# Patient Record
Sex: Male | Born: 1960 | Race: White | Hispanic: No | Marital: Married | Smoking: Current every day smoker
Health system: Southern US, Community
[De-identification: ages and names within clinical notes are randomized; demographics above are authoritative.]

---

## 1999-09-07 ENCOUNTER — Emergency Department (HOSPITAL_COMMUNITY): Admission: EM | Admit: 1999-09-07 | Discharge: 1999-09-07 | Payer: Self-pay | Admitting: Emergency Medicine

## 1999-09-07 ENCOUNTER — Encounter: Payer: Self-pay | Admitting: Emergency Medicine

## 2006-12-15 ENCOUNTER — Inpatient Hospital Stay (HOSPITAL_COMMUNITY): Admission: EM | Admit: 2006-12-15 | Discharge: 2006-12-17 | Payer: Self-pay | Admitting: Emergency Medicine

## 2010-06-01 NOTE — Discharge Summary (Signed)
NAME:  JERIEL, VIVANCO NO.:  [id number]   MEDICAL RECORD NO.:  [id number]          PATIENT TYPE:  INP   LOCATION:  1439                         FACILITY:  [facility]   PHYSICIAN:  Gardiner Barefoot, MD    DATE OF BIRTH:  [birth year]   DATE OF ADMISSION:  12/15/2006  DATE OF DISCHARGE:  12/17/2006                               DISCHARGE SUMMARY   ADDENDUM:  In discussion with the patient, his laboratory values have  all normalized except for the bicarb level which remains a little bit  low at 14 which is a decrease from 15 from his last check.  The patient,  however, continues to feel well and is insistent on leaving and does  assure he will follow up with is primary care physician the following  day for a recheck of his blood sugars.  He will continue to check his  blood sugars 3 times a day in the fasting a.m. every day starting today.  He feels comfortable with the plan, and his wife also was comfortable  with the plan of following up with his physician the following day.  For  this reason, I feel it is safe to discharge the patient with close  followup and good p.o. intake with no symptoms of hypoglycemia.      Gardiner Barefoot, MD  Electronically Signed     RWC/MEDQ  D:  12/17/2006  T:  12/17/2006  Job:  [id number]

## 2010-06-01 NOTE — Discharge Summary (Signed)
NAME:  Ronnie Dunn, Ronnie Dunn NO.:  [id number]   MEDICAL RECORD NO.:  [id number]          PATIENT TYPE:  INP   LOCATION:  1439                         FACILITY:  [facility]   PHYSICIAN:  Gardiner Barefoot, MD    DATE OF BIRTH:  11-Feb-1960   DATE OF ADMISSION:  12/15/2006  DATE OF DISCHARGE:  12/17/2006                               DISCHARGE SUMMARY   PRIMARY CARE PHYSICIAN:  Jerene Dilling, M.D.   CHIEF COMPLAINT:  Hypercholesterolemia.   DISCHARGE DIAGNOSES:  1. Diabetic ketoacidosis, resolved.  2. Type 2 diabetes.   DISCHARGE MEDICATIONS:  1. Metformin 1000 mg p.o. q. a.m. and 500 mg p.o. q. p.m.   HISTORY OF PRESENT ILLNESS:  Briefly, this is a 50 year old male who had  a 2 week history of dehydration, polydipsia, polyuria. Two days prior to  admission had presented to his primary care physician and was diagnosed  with diabetes with a blood sugar reported by the patient to be  approximately 1200. The patient was given some insulin in the office and  started on Glucophage 500 mg daily. However, over the 2 days after  starting his medication, he continued to have difficulty with his blood  sugars, dehydration, and nausea and vomiting. The patient then presented  to the emergency room after checking his home blood sugar, which was too  elevated to be read on the machine and was 600 on presentation to the  emergency room.   HOSPITAL COURSE:  The patient was admitted. His initial labs were  concerning with bicarb of 12, glucose of 639, some acute renal failure,  an ABG with pH of 7.22. The patient was started on Glucomander insulin  drip and his blood sugars were followed closely. The patient, overnight  after admission, was transitioned to subcutaneous insulin and his p.o.  Metformin was restarted. The patient did have some diabetes medication.  The patient, at the time of discharge, was able to tolerate p.o., was  ambulatory and eager to go home. The patient does state  that he will  followup with his primary care physician in the next 1 to 2 days and  continue to check his fasting blood sugars daily, as well as prior to  meals and after meals and report to his primary care physician, the  results. He was advised of the new dosage of his Metformin to do 1000 mg  in the morning and 500 in the afternoon, with anticipation of further  increase as an outpatient. At discharge, his creatinine had stabilized  at 1.04 and his sodium was slightly decreased at  133, secondary to increased blood sugar. Of note, his bicarb at  discharge was noted to be 14 and he will need close followup by his  primary care physician.   DISPOSITION:  The patient was stable at discharge and comfortable with  the plan.      Gardiner Barefoot, MD  Electronically Signed     RWC/MEDQ  D:  12/17/2006  T:  12/17/2006  Job:  [id number]   cc:   Windle Guard, M.D.  Fax: [phone]

## 2010-06-01 NOTE — H&P (Signed)
NAME:  Ronnie Dunn, DEC NO.:  [id number]   MEDICAL RECORD NO.:  [id number]          PATIENT TYPE:  EMS   LOCATION:  ED                           FACILITY:  [facility]   PHYSICIAN:  Gardiner Barefoot, MD    DATE OF BIRTH:  [birth year]   DATE OF ADMISSION:  12/15/2006  DATE OF DISCHARGE:                              HISTORY & PHYSICAL   PRIMARY CARE PHYSICIAN:  [name].   CHIEF COMPLAINT:  Hyperglycemia.   HISTORY OF PRESENT ILLNESS:  This is a 50 year old male who had about  approximately 2-week history of dehydration, polyuria, polydipsia, and  had presented to his primary care physician 2 days prior to this  admission with the said complaints and was diagnosed with diabetes with  a blood sugar reported to be 1200.  The patient was subsequently given  insulin in the office and started on Glucophage 500 mg daily.  However,  continued to have difficulty with his sugars, dehydration and/or p.o.  The patient presented to the emergency room after checking his blood  sugar and it was too elevated to be read and was noted to have a blood  sugar in the 600s upon presentation to the emergency room.  The patient  otherwise has had very little p.o. intake over the last several days and  otherwise reports no fever, nausea or vomiting or abdominal pain.   PAST MEDICAL HISTORY:  Diabetes.   MEDICATION:  Glucophage 500 mg daily.   DRUG ALLERGIES:  NO KNOWN DRUG ALLERGIES.   FAMILY HISTORY:  Diabetes in his mother.   SOCIAL HISTORY:  The patient is a smoker.  Denies any alcohol or drugs.   REVIEW OF SYSTEMS:  Negative except as in the history of present  illness.   PHYSICAL EXAMINATION:  Temperature is 97.4, pulse 78, respirations 20,  blood pressure 131/92, and O2 sats 96% on room air.  GENERAL: The patient is awake, alert and oriented x3 and appears in mild  distress.  HEENT:  His mucous membranes are dry.  CARDIOVASCULAR:  Tachycardiac with regular rhythm.  No murmurs,  rubs or  gallops.  LUNGS:  Clear to auscultation bilaterally.  ABDOMEN:  Obese, soft, nontender, nondistended.  Positive bowel sounds  and no hepatosplenomegaly.  EXTREMITIES:  No edema.   LABORATORY DATA:  White count 11, hemoglobin 15, platelets 377.  Sodium  was reported to be 117, however this does not appear in the computer at  this time, potassium 4.5, chloride 81, bicarb is 12, glucose 639, BUN  25, creatinine is 1.97, total bili 3.2, alk phos 86, AST 16, ALT 13,  blood acetone is small, and ABG notable for a pH of 7.22/19/99/7.8.   IMPRESSION:  Diabetic ketoacidosis.  The patient has been started in the  emergency room on the Glucomander and will be continued on the drip  until his blood sugar continues to decrease significantly, and presently  is on normal saline IV fluids, which will be changed to D5 normal saline  when his blood sugars are below 250.  Will transition the patient to  subcutaneous  insulin at the appropriate time when his blood sugars are  below the target range for 4 hours and then restart him on his oral  medications and restart his diet.  Will also have the patient receive  some diabetic education and monitor his potassium closely.      Gardiner Barefoot, MD  Electronically Signed     RWC/MEDQ  D:  12/15/2006  T:  12/15/2006  Job:  [id number]

## 2010-10-26 LAB — BLOOD GAS, ARTERIAL
Acid-base deficit: 18.4 — ABNORMAL HIGH
TCO2: 7.1
pCO2 arterial: 19.5 — CL
pO2, Arterial: 99.1

## 2010-10-26 LAB — COMPREHENSIVE METABOLIC PANEL
ALT: 13
AST: 16
Albumin: 3.6
Alkaline Phosphatase: 86
BUN: 25 — ABNORMAL HIGH
CO2: 12 — ABNORMAL LOW
Calcium: 8.8
Chloride: 81 — ABNORMAL LOW
Creatinine, Ser: 1.97 — ABNORMAL HIGH
GFR calc Af Amer: 44 — ABNORMAL LOW
GFR calc non Af Amer: 37 — ABNORMAL LOW
Glucose, Bld: 639
Potassium: 4.5
Total Bilirubin: 3.2 — ABNORMAL HIGH
Total Protein: 7.8

## 2010-10-26 LAB — URINALYSIS, ROUTINE W REFLEX MICROSCOPIC
Glucose, UA: >1000 — AB
Leukocytes, UA: NEGATIVE
Nitrite: NEGATIVE
Protein, ur: NEGATIVE
pH: 5

## 2010-10-26 LAB — DIFFERENTIAL
Basophils Absolute: 0.1
Basophils Relative: 1
Eosinophils Absolute: 0.1 — ABNORMAL LOW
Eosinophils Relative: 1
Lymphocytes Relative: 17
Lymphs Abs: 1.8
Monocytes Absolute: 0.6
Monocytes Relative: 5
Neutro Abs: 8.3 — ABNORMAL HIGH
Neutrophils Relative %: 76

## 2010-10-26 LAB — BASIC METABOLIC PANEL
BUN: 13
BUN: 18
Calcium: 7.6 — ABNORMAL LOW
Calcium: 8 — ABNORMAL LOW
Calcium: 8.1 — ABNORMAL LOW
Calcium: 8.2 — ABNORMAL LOW
Chloride: 94 — ABNORMAL LOW
Creatinine, Ser: 1.18
Creatinine, Ser: 1.21
Creatinine, Ser: 1.38
GFR calc Af Amer: >60
GFR calc Af Amer: >60
GFR calc Af Amer: >60
GFR calc non Af Amer: 55 — ABNORMAL LOW
GFR calc non Af Amer: >60
GFR calc non Af Amer: >60
GFR calc non Af Amer: >60
Potassium: 3.3 — ABNORMAL LOW
Potassium: 3.9
Sodium: 129 — ABNORMAL LOW
Sodium: 133 — ABNORMAL LOW

## 2010-10-26 LAB — CBC
HCT: 46.4
Hemoglobin: 16.6
MCHC: 35.6
MCV: 88.8
Platelets: 377
RBC: 5.23
RDW: 12.2
WBC: 11 — ABNORMAL HIGH

## 2010-10-26 LAB — KETONES, QUALITATIVE

## 2010-10-26 LAB — URINE MICROSCOPIC-ADD ON

## 2019-11-07 ENCOUNTER — Other Ambulatory Visit: Payer: Self-pay

## 2019-11-07 ENCOUNTER — Ambulatory Visit
Admission: RE | Admit: 2019-11-07 | Discharge: 2019-11-07 | Disposition: A | Discharge status: Home / Self Care | Payer: BC Managed Care – PPO | Source: Ambulatory Visit | Attending: Nurse Practitioner | Admitting: Nurse Practitioner

## 2019-11-07 ENCOUNTER — Other Ambulatory Visit: Payer: Self-pay | Admitting: Nurse Practitioner

## 2019-11-07 DIAGNOSIS — M25571 Pain in right ankle and joints of right foot: Secondary | ICD-10-CM

## 2019-11-29 ENCOUNTER — Other Ambulatory Visit: Payer: Self-pay | Admitting: Nurse Practitioner

## 2019-11-29 ENCOUNTER — Ambulatory Visit
Admission: RE | Admit: 2019-11-29 | Discharge: 2019-11-29 | Disposition: A | Discharge status: Home / Self Care | Payer: BC Managed Care – PPO | Source: Ambulatory Visit | Attending: Nurse Practitioner | Admitting: Nurse Practitioner

## 2019-11-29 ENCOUNTER — Other Ambulatory Visit: Payer: Self-pay

## 2019-11-29 DIAGNOSIS — M79671 Pain in right foot: Secondary | ICD-10-CM

## 2020-03-28 ENCOUNTER — Inpatient Hospital Stay (HOSPITAL_COMMUNITY)
Admission: EM | Admit: 2020-03-28 | Discharge: 2020-03-29 | DRG: 101 | Disposition: A | Discharge status: Home / Self Care | Payer: 59 | Attending: Student in an Organized Health Care Education/Training Program | Admitting: Student in an Organized Health Care Education/Training Program

## 2020-03-28 ENCOUNTER — Other Ambulatory Visit: Payer: Self-pay

## 2020-03-28 ENCOUNTER — Emergency Department (HOSPITAL_COMMUNITY): Payer: 59

## 2020-03-28 DIAGNOSIS — E669 Obesity, unspecified: Secondary | ICD-10-CM | POA: Diagnosis present

## 2020-03-28 DIAGNOSIS — I959 Hypotension, unspecified: Secondary | ICD-10-CM | POA: Diagnosis present

## 2020-03-28 DIAGNOSIS — F10239 Alcohol dependence with withdrawal, unspecified: Secondary | ICD-10-CM | POA: Diagnosis present

## 2020-03-28 DIAGNOSIS — N62 Hypertrophy of breast: Secondary | ICD-10-CM | POA: Diagnosis present

## 2020-03-28 DIAGNOSIS — I781 Nevus, non-neoplastic: Secondary | ICD-10-CM | POA: Diagnosis present

## 2020-03-28 DIAGNOSIS — I10 Essential (primary) hypertension: Secondary | ICD-10-CM | POA: Diagnosis present

## 2020-03-28 DIAGNOSIS — E119 Type 2 diabetes mellitus without complications: Secondary | ICD-10-CM | POA: Diagnosis present

## 2020-03-28 DIAGNOSIS — E860 Dehydration: Secondary | ICD-10-CM | POA: Diagnosis present

## 2020-03-28 DIAGNOSIS — R21 Rash and other nonspecific skin eruption: Secondary | ICD-10-CM | POA: Diagnosis present

## 2020-03-28 DIAGNOSIS — Z6833 Body mass index (BMI) 33.0-33.9, adult: Secondary | ICD-10-CM

## 2020-03-28 DIAGNOSIS — F17210 Nicotine dependence, cigarettes, uncomplicated: Secondary | ICD-10-CM | POA: Diagnosis present

## 2020-03-28 DIAGNOSIS — R062 Wheezing: Secondary | ICD-10-CM | POA: Diagnosis not present

## 2020-03-28 DIAGNOSIS — F1010 Alcohol abuse, uncomplicated: Secondary | ICD-10-CM

## 2020-03-28 DIAGNOSIS — R55 Syncope and collapse: Secondary | ICD-10-CM | POA: Diagnosis not present

## 2020-03-28 DIAGNOSIS — E785 Hyperlipidemia, unspecified: Secondary | ICD-10-CM | POA: Diagnosis present

## 2020-03-28 DIAGNOSIS — K769 Liver disease, unspecified: Secondary | ICD-10-CM | POA: Diagnosis present

## 2020-03-28 DIAGNOSIS — R569 Unspecified convulsions: Secondary | ICD-10-CM | POA: Diagnosis present

## 2020-03-28 DIAGNOSIS — E871 Hypo-osmolality and hyponatremia: Secondary | ICD-10-CM | POA: Diagnosis present

## 2020-03-28 DIAGNOSIS — F1020 Alcohol dependence, uncomplicated: Secondary | ICD-10-CM | POA: Diagnosis present

## 2020-03-28 DIAGNOSIS — Z20822 Contact with and (suspected) exposure to covid-19: Secondary | ICD-10-CM | POA: Diagnosis present

## 2020-03-28 DIAGNOSIS — J441 Chronic obstructive pulmonary disease with (acute) exacerbation: Secondary | ICD-10-CM | POA: Diagnosis present

## 2020-03-28 DIAGNOSIS — E861 Hypovolemia: Secondary | ICD-10-CM | POA: Diagnosis present

## 2020-03-28 DIAGNOSIS — E872 Acidosis: Secondary | ICD-10-CM | POA: Diagnosis present

## 2020-03-28 DIAGNOSIS — Z801 Family history of malignant neoplasm of trachea, bronchus and lung: Secondary | ICD-10-CM

## 2020-03-28 DIAGNOSIS — Z79899 Other long term (current) drug therapy: Secondary | ICD-10-CM

## 2020-03-28 LAB — OSMOLALITY: Osmolality: 257 mOsm/kg — ABNORMAL LOW (ref 275–295)

## 2020-03-28 LAB — BASIC METABOLIC PANEL
Anion gap: 11 (ref 5–15)
Anion gap: 8 (ref 5–15)
BUN: 11 mg/dL (ref 6–20)
BUN: 9 mg/dL (ref 6–20)
CO2: 21 mmol/L — ABNORMAL LOW (ref 22–32)
CO2: 23 mmol/L (ref 22–32)
Calcium: 8.3 mg/dL — ABNORMAL LOW (ref 8.9–10.3)
Calcium: 8.1 mg/dL — ABNORMAL LOW (ref 8.9–10.3)
Chloride: 88 mmol/L — ABNORMAL LOW (ref 98–111)
Chloride: 91 mmol/L — ABNORMAL LOW (ref 98–111)
Creatinine, Ser: 0.78 mg/dL (ref 0.61–1.24)
Creatinine, Ser: 0.74 mg/dL (ref 0.61–1.24)
GFR, Estimated: >60 mL/min (ref >60)
GFR, Estimated: >60 mL/min (ref >60)
Glucose, Bld: 105 mg/dL — ABNORMAL HIGH (ref 70–99)
Glucose, Bld: 96 mg/dL (ref 70–99)
Potassium: 4.4 mmol/L (ref 3.5–5.1)
Potassium: 4.2 mmol/L (ref 3.5–5.1)
Sodium: 120 mmol/L — ABNORMAL LOW (ref 135–145)
Sodium: 122 mmol/L — ABNORMAL LOW (ref 135–145)

## 2020-03-28 LAB — RAPID URINE DRUG SCREEN, HOSP PERFORMED
Amphetamines: NOT DETECTED
Barbiturates: NOT DETECTED
Benzodiazepines: NOT DETECTED
Cocaine: NOT DETECTED
Opiates: NOT DETECTED
Tetrahydrocannabinol: NOT DETECTED

## 2020-03-28 LAB — URINALYSIS, ROUTINE W REFLEX MICROSCOPIC
Bacteria, UA: NONE SEEN
Bilirubin Urine: NEGATIVE
Glucose, UA: NEGATIVE mg/dL
Hgb urine dipstick: NEGATIVE
Ketones, ur: NEGATIVE mg/dL
Nitrite: NEGATIVE
Protein, ur: 100 mg/dL — AB
Specific Gravity, Urine: 1.01 (ref 1.005–1.030)
pH: 6 (ref 5.0–8.0)

## 2020-03-28 LAB — COMPREHENSIVE METABOLIC PANEL
ALT: 17 U/L (ref 0–44)
AST: 35 U/L (ref 15–41)
Albumin: 3.2 g/dL — ABNORMAL LOW (ref 3.5–5.0)
Alkaline Phosphatase: 93 U/L (ref 38–126)
Anion gap: 11 (ref 5–15)
BUN: 11 mg/dL (ref 6–20)
CO2: 22 mmol/L (ref 22–32)
Calcium: 8.3 mg/dL — ABNORMAL LOW (ref 8.9–10.3)
Chloride: 86 mmol/L — ABNORMAL LOW (ref 98–111)
Creatinine, Ser: 0.98 mg/dL (ref 0.61–1.24)
GFR, Estimated: >60 mL/min (ref >60)
Glucose, Bld: 123 mg/dL — ABNORMAL HIGH (ref 70–99)
Potassium: 4.7 mmol/L (ref 3.5–5.1)
Sodium: 119 mmol/L — CL (ref 135–145)
Total Bilirubin: 0.9 mg/dL (ref 0.3–1.2)
Total Protein: 6.9 g/dL (ref 6.5–8.1)

## 2020-03-28 LAB — LIPASE, BLOOD: Lipase: 51 U/L (ref 11–51)

## 2020-03-28 LAB — CBC WITH DIFFERENTIAL/PLATELET
Abs Immature Granulocytes: 0.03 10*3/uL (ref 0.00–0.07)
Basophils Absolute: 0.1 10*3/uL (ref 0.0–0.1)
Basophils Relative: 1 %
Eosinophils Absolute: 0.1 10*3/uL (ref 0.0–0.5)
Eosinophils Relative: 1 %
HCT: 33 % — ABNORMAL LOW (ref 39.0–52.0)
Hemoglobin: 12.1 g/dL — ABNORMAL LOW (ref 13.0–17.0)
Immature Granulocytes: 0 %
Lymphocytes Relative: 10 %
Lymphs Abs: 0.7 10*3/uL (ref 0.7–4.0)
MCH: 34.5 pg — ABNORMAL HIGH (ref 26.0–34.0)
MCHC: 36.7 g/dL — ABNORMAL HIGH (ref 30.0–36.0)
MCV: 94 fL (ref 80.0–100.0)
Monocytes Absolute: 0.6 10*3/uL (ref 0.1–1.0)
Monocytes Relative: 8 %
Neutro Abs: 5.7 10*3/uL (ref 1.7–7.7)
Neutrophils Relative %: 80 %
Platelets: 412 10*3/uL — ABNORMAL HIGH (ref 150–400)
RBC: 3.51 MIL/uL — ABNORMAL LOW (ref 4.22–5.81)
RDW: 11.6 % (ref 11.5–15.5)
WBC: 7.1 10*3/uL (ref 4.0–10.5)
nRBC: 0 % (ref 0.0–0.2)

## 2020-03-28 LAB — LACTIC ACID, PLASMA
Lactic Acid, Venous: 2.3 mmol/L (ref 0.5–1.9)
Lactic Acid, Venous: 1.8 mmol/L (ref 0.5–1.9)

## 2020-03-28 LAB — ETHANOL: Alcohol, Ethyl (B): <10 mg/dL (ref <10)

## 2020-03-28 LAB — RESP PANEL BY RT-PCR (FLU A&B, COVID) ARPGX2
Influenza A by PCR: NEGATIVE
Influenza B by PCR: NEGATIVE
SARS Coronavirus 2 by RT PCR: NEGATIVE

## 2020-03-28 LAB — MAGNESIUM
Magnesium: 1.3 mg/dL — ABNORMAL LOW (ref 1.7–2.4)
Magnesium: 2 mg/dL (ref 1.7–2.4)

## 2020-03-28 LAB — BRAIN NATRIURETIC PEPTIDE: B Natriuretic Peptide: 52.2 pg/mL (ref 0.0–100.0)

## 2020-03-28 LAB — PROTIME-INR
INR: 1 (ref 0.8–1.2)
Prothrombin Time: 12.6 seconds (ref 11.4–15.2)

## 2020-03-28 LAB — I-STAT VENOUS BLOOD GAS, ED
Acid-base deficit: 2 mmol/L (ref 0.0–2.0)
Bicarbonate: 23.5 mmol/L (ref 20.0–28.0)
Calcium, Ion: 1.01 mmol/L — ABNORMAL LOW (ref 1.15–1.40)
HCT: 36 % — ABNORMAL LOW (ref 39.0–52.0)
Hemoglobin: 12.2 g/dL — ABNORMAL LOW (ref 13.0–17.0)
O2 Saturation: 85 %
Potassium: 4.7 mmol/L (ref 3.5–5.1)
Sodium: 120 mmol/L — ABNORMAL LOW (ref 135–145)
TCO2: 25 mmol/L (ref 22–32)
pCO2, Ven: 41.2 mmHg — ABNORMAL LOW (ref 44.0–60.0)
pH, Ven: 7.364 (ref 7.250–7.430)
pO2, Ven: 52 mmHg — ABNORMAL HIGH (ref 32.0–45.0)

## 2020-03-28 LAB — HIV ANTIBODY (ROUTINE TESTING W REFLEX): HIV Screen 4th Generation wRfx: NONREACTIVE

## 2020-03-28 LAB — CBG MONITORING, ED: Glucose-Capillary: 130 mg/dL — ABNORMAL HIGH (ref 70–99)

## 2020-03-28 LAB — TROPONIN I (HIGH SENSITIVITY)
Troponin I (High Sensitivity): 8 ng/L (ref <18)
Troponin I (High Sensitivity): 7 ng/L (ref <18)

## 2020-03-28 LAB — OSMOLALITY, URINE: Osmolality, Ur: 285 mOsm/kg — ABNORMAL LOW (ref 300–900)

## 2020-03-28 LAB — PHOSPHORUS
Phosphorus: 4 mg/dL (ref 2.5–4.6)
Phosphorus: 3.5 mg/dL (ref 2.5–4.6)

## 2020-03-28 LAB — TSH: TSH: 1.567 u[IU]/mL (ref 0.350–4.500)

## 2020-03-28 LAB — SODIUM, URINE, RANDOM: Sodium, Ur: 13 mmol/L

## 2020-03-28 MED ORDER — PREDNISONE 20 MG PO TABS
40.0000 mg | ORAL_TABLET | Freq: Every day | ORAL | Status: DC
  Administered 2020-03-29: 40 mg via ORAL
  Filled 2020-03-28: qty 2

## 2020-03-28 MED ORDER — ATORVASTATIN CALCIUM 10 MG PO TABS
10.0000 mg | ORAL_TABLET | Freq: Every day | ORAL | Status: DC
  Administered 2020-03-29: 10 mg via ORAL
  Filled 2020-03-28: qty 1

## 2020-03-28 MED ORDER — MOMETASONE FURO-FORMOTEROL FUM 100-5 MCG/ACT IN AERO: 2.0000 | INHALATION_SPRAY | Freq: Two times a day (BID) | RESPIRATORY_TRACT | Status: DC

## 2020-03-28 MED ORDER — ACETAMINOPHEN 325 MG PO TABS: 650.0000 mg | ORAL_TABLET | Freq: Four times a day (QID) | ORAL | Status: DC | PRN

## 2020-03-28 MED ORDER — UMECLIDINIUM BROMIDE 62.5 MCG/INH IN AEPB
1.0000 | INHALATION_SPRAY | Freq: Every day | RESPIRATORY_TRACT | Status: DC
  Administered 2020-03-29: 1 via RESPIRATORY_TRACT
  Filled 2020-03-28: qty 7

## 2020-03-28 MED ORDER — LORAZEPAM 2 MG/ML IJ SOLN
1.0000 mg | Freq: Once | INTRAMUSCULAR | Status: AC
  Administered 2020-03-28: 1 mg via INTRAVENOUS

## 2020-03-28 MED ORDER — ADULT MULTIVITAMIN W/MINERALS CH
1.0000 | ORAL_TABLET | Freq: Every day | ORAL | Status: DC
  Administered 2020-03-29: 1 via ORAL
  Filled 2020-03-28: qty 1

## 2020-03-28 MED ORDER — SODIUM CHLORIDE 0.9 % IV SOLN: Freq: Once | INTRAVENOUS | Status: AC

## 2020-03-28 MED ORDER — FOLIC ACID 1 MG PO TABS
1.0000 mg | ORAL_TABLET | Freq: Every day | ORAL | Status: DC
  Administered 2020-03-29: 1 mg via ORAL
  Filled 2020-03-28: qty 1

## 2020-03-28 MED ORDER — LORAZEPAM 1 MG PO TABS: 0.0000 mg | ORAL_TABLET | Freq: Two times a day (BID) | ORAL | Status: DC

## 2020-03-28 MED ORDER — ACETAMINOPHEN 650 MG RE SUPP: 650.0000 mg | Freq: Four times a day (QID) | RECTAL | Status: DC | PRN

## 2020-03-28 MED ORDER — LORAZEPAM 1 MG PO TABS: 0.0000 mg | ORAL_TABLET | Freq: Four times a day (QID) | ORAL | Status: DC

## 2020-03-28 MED ORDER — CALAMINE EX LOTN
TOPICAL_LOTION | Freq: Two times a day (BID) | CUTANEOUS | Status: DC
  Filled 2020-03-28: qty 177

## 2020-03-28 MED ORDER — THIAMINE HCL 100 MG PO TABS
100.0000 mg | ORAL_TABLET | Freq: Every day | ORAL | Status: DC
  Administered 2020-03-29: 100 mg via ORAL
  Filled 2020-03-28 (×2): qty 1

## 2020-03-28 MED ORDER — THIAMINE HCL 100 MG/ML IJ SOLN
100.0000 mg | Freq: Every day | INTRAMUSCULAR | Status: DC
  Administered 2020-03-28: 100 mg via INTRAVENOUS
  Filled 2020-03-28: qty 2

## 2020-03-28 MED ORDER — SODIUM CHLORIDE 0.9 % IV BOLUS
1000.0000 mL | Freq: Once | INTRAVENOUS | Status: AC
  Administered 2020-03-28: 1000 mL via INTRAVENOUS

## 2020-03-28 MED ORDER — LORAZEPAM 2 MG/ML IJ SOLN
0.0000 mg | Freq: Four times a day (QID) | INTRAMUSCULAR | Status: DC
  Administered 2020-03-29: 2 mg via INTRAVENOUS
  Filled 2020-03-28 (×2): qty 1

## 2020-03-28 MED ORDER — LORAZEPAM 2 MG/ML IJ SOLN: 0.0000 mg | Freq: Two times a day (BID) | INTRAMUSCULAR | Status: DC

## 2020-03-28 MED ORDER — SODIUM CHLORIDE 0.9% FLUSH
3.0000 mL | Freq: Two times a day (BID) | INTRAVENOUS | Status: DC
  Administered 2020-03-28 – 2020-03-29 (×2): 3 mL via INTRAVENOUS

## 2020-03-28 MED ORDER — MOMETASONE FURO-FORMOTEROL FUM 200-5 MCG/ACT IN AERO
2.0000 | INHALATION_SPRAY | Freq: Two times a day (BID) | RESPIRATORY_TRACT | Status: DC
  Administered 2020-03-28 – 2020-03-29 (×2): 2 via RESPIRATORY_TRACT
  Filled 2020-03-28 (×2): qty 8.8

## 2020-03-28 MED ORDER — POLYETHYLENE GLYCOL 3350 17 G PO PACK: 17.0000 g | PACK | Freq: Every day | ORAL | Status: DC | PRN

## 2020-03-28 MED ORDER — SODIUM CHLORIDE 0.9 % IV SOLN: INTRAVENOUS | Status: AC

## 2020-03-28 MED ORDER — THIAMINE HCL 100 MG PO TABS: 100.0000 mg | ORAL_TABLET | Freq: Every day | ORAL | Status: DC

## 2020-03-28 MED ORDER — MAGNESIUM SULFATE 2 GM/50ML IV SOLN
2.0000 g | Freq: Once | INTRAVENOUS | Status: AC
  Administered 2020-03-28: 2 g via INTRAVENOUS
  Filled 2020-03-28: qty 50

## 2020-03-28 MED ORDER — ENOXAPARIN SODIUM 60 MG/0.6ML ~~LOC~~ SOLN
60.0000 mg | SUBCUTANEOUS | Status: DC
  Filled 2020-03-28: qty 0.6

## 2020-03-28 NOTE — H&P (Addendum)
Date: 03/28/2020               Ronnie Dunn Name:  Ronnie Dunn MRN: [id number]  DOB: 19-Feb-1960 Age / Sex: 60 y.o., male   PCP: Ronnie Dunn, No Pcp Per         Medical Service: Internal Medicine Teaching Service         Attending Physician: Dr. Charlesetta Shanks, MD    First Contact: Sanjuana Letters, DO Pager: Maryjean Morn [phone]  Second Contact: Marianna Payment, DO Pager: [facility] [phone]       After Hours (After 5p/  First Contact Pager: [phone]  weekends / holidays): Second Contact Pager: [phone]   SUBJECTIVE   Chief Complaint: Seizure-like activity  History of Present Illness: Ronnie Dunn is a 60 y.o. male with a pertinent PMH of hypertension, HLD, type II DM, alcohol use, syncopal episodes of unknown etiology who presents to [facility] with witnessed syncopal episode this morning.  Ronnie Dunn woke up this morning stating he did not feel well.  He sat up on the edge of the bed and his wife states he fell back on the bed and began shaking.  During this episode he lost control of his bowel and bladder.  He was also unresponsive during this time, he did not hit his head.  She is unsure of how long this episode lasted, but states that he was confused after the event for 30 minutes to an hour.  EMS was then called to the scene.  Ronnie Dunn's wife states that once EMS arrived, his pressures were low and EMS passed out.   The Ronnie Dunn's wife endorses a similar episode that occurred 2 weeks ago.  The Ronnie Dunn sat up in bed fell backwards his left arm contracted while his right arm flexed fully and flaccid.  She notes he shake vigorously for less than 30 seconds and then came to.  She states he was only confused for a little bit of time after this, and the Ronnie Dunn did not want to come to the hospital to be evaluated.  He also lost bowel or bladder function during this.  During both episodes Ronnie Dunn was cold wet and clammy.  Prior to both seizure episodes, the Ronnie Dunn notes that he feels lightheaded and can feel the episode  starting.  Ronnie Dunn has history of syncopal episodes of the past few years, that he states were due to heat and low blood pressure.  He has never been evaluated for this.  Over the past few weeks the Ronnie Dunn states he has not felt well, feeling unusually tired.  He has had a decrease in appetite for the past few days.  He has vomited once or twice a day for the week.  The vomit is liquidy and white in nature.  He also endorses diarrhea for the past 3 days, he has 2 episodes per day.  There is no blood in the diarrhea.  Denies any changes in his diet.  Ronnie Dunn endorses drinking 8-10 beers per day since he was 60 years old.  He had 3 months of sobriety few years ago, that he stopped "cold [place]."  He has never had to be hospitalized for alcohol withdrawals or due to his alcohol use.  Last week he stopped drinking for 3 days, denies any symptoms of withdrawal.  His last drink prior to arrival was 8 PM yesterday evening.  He denies any fevers or chills.  He denies any chest pain or palpitations.  Denies any shortness of breath.  Denies  any nausea.  Ronnie Dunn also endorses a rash on his left lower extremity as well as left forearm, that is itchy.  He notes that it occurred after being outside setting up his deer stand.  Medications:  Current Meds  Medication Sig   amLODipine (NORVASC) 5 MG tablet Take 5 mg by mouth daily.   atorvastatin (LIPITOR) 10 MG tablet Take 10 mg by mouth daily.   benzonatate (TESSALON) 200 MG capsule Take 200 mg by mouth 3 (three) times daily as needed for cough.   cholecalciferol (VITAMIN D3) 25 MCG (1000 UNIT) tablet Take 1,000 Units by mouth daily.   hydrochlorothiazide (HYDRODIURIL) 25 MG tablet Take 50 mg by mouth daily.   lisinopril (ZESTRIL) 40 MG tablet Take 40 mg by mouth daily.   naproxen (NAPROSYN) 500 MG tablet Take 500 mg by mouth daily as needed for pain.   traZODone (DESYREL) 50 MG tablet Take 50 mg by mouth daily as needed for sleep (anxiety).   vitamin C (ASCORBIC  ACID) 500 MG tablet Take 500 mg by mouth daily.   Zinc 25 MG TABS Take 25 mg by mouth daily.    Social:  Lives -wife Occupation - pool maintenance Support -family Level of function -able to perform all ADLs without difficulty PCP -none Substance use -alcohol, beer 8-10 a day.  Heavy tobacco use, 2 packs/day for the past 35 years.  Denies illicit substance use or taking any medications that were not prescribed to him.  Family History: Siblings -atrial fibrillation in his brother, sister with lung cancer status post lobectomy  Allergies: Allergies as of 03/28/2020   (No Known Allergies)   No past medical history on file.  Review of Systems: A complete ROS was negative except as per HPI.   OBJECTIVE:   Physical Exam: Blood pressure (!) 146/91, pulse 89, temperature 98 F (36.7 C), temperature source Oral, resp. rate 20, height 6' 3"  (1.905 m), weight 121.1 kg, SpO2 97 %.  Constitutional: Resting in bed comfortably, no acute distress HENT: normocephalic atraumatic, dry mucous membranes.  Facial erythema Eyes: conjunctiva non-erythematous, EOMI, PERRL Neck: supple Cardiovascular: regular rate and rhythm, no m/r/g Pulmonary/Chest: Diffuse wheezing.  No respiratory distress. Abdominal: Soft, obese, nontender. Normal bowel sounds. MSK: normal bulk and tone Neurological: alert & oriented x 3, 5/5 strength in bilateral upper and lower extremities, Ronnie Dunn without facial asymmetry, Ronnie Dunn without uvular deviation, normal finger-to-nose, normal heel-to-shin, no pronator drift appreciated.  Normal sensation throughout. skin: warm and dry.  Spider telangiectasias on chest.  Left lower extremity rash with excoriations and small ulcerative lesions Psych: Normal mood  Labs: CBC    Component Value Date/Time   WBC 7.1 03/28/2020 1136   RBC 3.51 (L) 03/28/2020 1136   HGB 12.2 (L) 03/28/2020 1222   HCT 36.0 (L) 03/28/2020 1222   PLT 412 (H) 03/28/2020 1136   MCV 94.0 03/28/2020 1136    MCH 34.5 (H) 03/28/2020 1136   MCHC 36.7 (H) 03/28/2020 1136   RDW 11.6 03/28/2020 1136   LYMPHSABS 0.7 03/28/2020 1136   MONOABS 0.6 03/28/2020 1136   EOSABS 0.1 03/28/2020 1136   BASOSABS 0.1 03/28/2020 1136     CMP     Component Value Date/Time   NA 120 (L) 03/28/2020 1222   K 4.7 03/28/2020 1222   CL 86 (L) 03/28/2020 1136   CO2 22 03/28/2020 1136   GLUCOSE 123 (H) 03/28/2020 1136   BUN 11 03/28/2020 1136   CREATININE 0.98 03/28/2020 1136   CALCIUM 8.3 (L) 03/28/2020  1136   PROT 6.9 03/28/2020 1136   ALBUMIN 3.2 (L) 03/28/2020 1136   AST 35 03/28/2020 1136   ALT 17 03/28/2020 1136   ALKPHOS 93 03/28/2020 1136   BILITOT 0.9 03/28/2020 1136   GFRNONAA >60 03/28/2020 1136   GFRAA  12/17/2006 0500    >60        The eGFR has been calculated using the MDRD equation. This calculation has not been validated in all clinical    Imaging: DG Chest 2 View  Result Date: 03/28/2020 CLINICAL DATA:  Syncope EXAM: CHEST - 2 VIEW COMPARISON:  None. FINDINGS: Cardiomegaly. Both lungs are clear. Disc degenerative disease of the thoracic spine. IMPRESSION: Cardiomegaly without acute abnormality of the lungs. Electronically Signed   By: Eddie Candle M.D.   On: 03/28/2020 13:04   CT Head Wo Contrast  Result Date: 03/28/2020 CLINICAL DATA:  Seizure. EXAM: CT HEAD WITHOUT CONTRAST TECHNIQUE: Contiguous axial images were obtained from the base of the skull through the vertex without intravenous contrast. COMPARISON:  None. FINDINGS: Brain: No subdural, epidural, or subarachnoid hemorrhage. Cerebellum, brainstem, and basal cisterns are normal. Ventricles and sulci are unremarkable. No acute cortical ischemia or infarct. No mass, mass effect, or midline shift. Vascular: Calcified atherosclerosis in the intracranial carotids. Skull: Normal. Negative for fracture or focal lesion. Sinuses/Orbits: No acute finding. Other: None. IMPRESSION: No acute abnormalities.  No cause for seizure identified.  Electronically Signed   By: Dorise Bullion III M.D   On: 03/28/2020 12:09    EKG: personally reviewed my interpretation is normal sinus rhythm at a rate of 75 bpm.  Prolonged PR interval.  Normal axis.  Normal QRS interval.  Broad T waves.  No acute ST wave changes.  No prior for comparison.  ASSESSMENT & PLAN:   Principal Problem:   Seizure ([place]) Active Problems:   Hyponatremia   Alcohol use disorder, moderate, dependence ([place])   Obesity   Essential hypertension  Ronnie Dunn is a 60 y.o. with pertinent PMH of PMH of hypertension, HLD, type II DM, alcohol use, syncopal episodes of unknown etiology who presented syncopal episode with seizure-like activity and admit for hyponatremia, seizure work-up, alcohol use disorder on hospital day 0  Hx of Syncopal Episodes Suspected Seizure Ronnie Dunn with history of syncopal episodes, Ronnie Dunn's wife states that the etiology of these were due to "heat" and low blood pressure.  Ronnie Dunn's wife's history of 2 episodes of shaking spells consistent with seizures: Loss of consciousness, loss of bowel and bladder function, diffuse shaking.  Ronnie Dunn with multiple possible etiologies of seizures and syncope: Alcohol use, hyponatremia or other electrolyte abnormalities, hypovolemia.  Suspect lactic acidosis secondary to seizure.  Due to Ronnie Dunn's lack of medical care, will need extensive work-up.  Will monitor for aspiration in setting of seizure.  In the setting of his acute severe hyponatremia and neurological symptoms, will consult PCCM. -PCCM consult pending -Head CT, no acute processes. -Consider EEG if additional seizures during admission -Trend lactate, receiving IV fluids for hyponatremia as per below -Echo pending -Correct hyponatremia as per below -CIWA protocol  Hyponatremia  Ronnie Dunn worsened initial sodium of 119 mmol/L.  Will correct slowly with normal saline, first 24 hours correction of 8 mmol/L. After that goal of 6 to 8 mmol/L correction  every 4 hours with normal saline.  Needs further testing below to determine underlying etiology.  Suspected etiologies of poor p.o. intake, diuretic, alcohol use. -BMPs every 4 hours, goal sodium of 125 over next 12 hours -Normal  saline 125 cc/h continuous -Pending serum osmolality, urine sodium, urine osmolality  Alcohol Use Hypomagnesemia Ronnie Dunn drinks 8-10 beers a day since he was young.  He has been able to stop in the past without having withdrawal-like symptoms.  The longest abstinence he had was 3 months.  Ronnie Dunn without signs/symptoms of cirrhosis, without elevated liver enzymes.  Platelets within normal limits.  No obvious signs of ascites to indicate any signs of hepatic congestion.  Do not suspect right upper quadrant ultrasound is warranted at this time.  Alcohol level of under 10. -CIWA protocol with Ativan -Replete mag with 2 g mag sulfate IV -We will provide resources for outpatient substance use programs -We will give thiamine, multivitamin, and folate.  Wheezing Ronnie Dunn with no formal diagnosis of COPD but in setting of wheezing and extensive tobacco use history, would expect some component of obstructive pulmonary disease. -Ronnie Dunn will need pulmonary outpatient follow-up for PFTs  Left lower extremity rash Ronnie Dunn notes itchy left lower extremity rash with small ulcerations and excoriations.  He states these onset after he was out walking in the woods.  Consistent with allergic etiology. -Calamine lotion twice daily  Hypertension History of hypertension, Ronnie Dunn takes amlodipine 5 mg daily, HCTZ 25 mg daily, lisinopril 40 mg daily. -Can restart home medications if need be  Diet: Normal VTE: Enoxaparin IVF: NS,125cc/hr Code: Full  Prior to Admission Living Arrangement: Home, living With wife Anticipated Discharge Location: Home Barriers to Discharge: Further evaluation of seizures  Dispo: Admit Ronnie Dunn to Inpatient with expected length of stay greater than 2  midnights.  Signed: Sanjuana Letters DO  Internal Medicine Resident PGY-1 [place]  Pager: [phone]    Please contact the on call pager after 5 pm and on weekends at [phone].

## 2020-03-28 NOTE — ED Provider Notes (Signed)
[facility] EMERGENCY DEPARTMENT Provider Note   CSN: [id number] Arrival date & time: 03/28/20  1048     History Chief Complaint  Patient presents with  . Seizures    Ronnie Dunn is a 60 y.o. male.  HPI Patient reports that he went to bed feeling a little unwell last night.  He cannot qualify in what respect.  He awakened this morning and set up at the edge of the bed.  He reports he immediately felt lightheaded and next thing he knew he was waking up on the floor with his wife calling EMS.  He denies he experienced any preceding headache or chest pain.  He denies he awakened with chest pain or headache.  Patient's wife is not here yet.  The report is there was 30 seconds of seizure activity.  Patient reports he thinks he passed out but doubted that he had a seizure.  He reports the same thing happened about 2 weeks ago.  He reports he awakened feeling fine and then sat up at edge of the bed and immediately lost consciousness for a brief period of time.  He did not seek any medical evaluation at that time he reports this morning his blood pressure was low when checked by EMS.  EMS report as it patient systolic blood pressure went from 110 sitting to 70s standing.  Patient was given a liter of fluids on route.  He reports he takes his blood pressure medications at night and always has without difficulty.  He took them last night as usual.  He reports he is on 3 medications.  He reports he does drink beer nightly.  He estimates 7-8 beers every evening.  He reports he smokes a pack of cigarettes and has for a long time.  He reports he has some chronic cough but has never been diagnosed or treated for COPD.  He reports he has some itchy rash on the outside of his left lower leg that he scratches quite a bit and has some redness around.    No past medical history on file.  There are no problems to display for this patient.        No family history on file.     Home  Medications Prior to Admission medications   Medication Sig Start Date End Date Taking? Authorizing Provider  amLODipine (NORVASC) 5 MG tablet Take 5 mg by mouth daily.   Yes [provider]  atorvastatin (LIPITOR) 10 MG tablet Take 10 mg by mouth daily.   Yes [provider]  benzonatate (TESSALON) 200 MG capsule Take 200 mg by mouth 3 (three) times daily as needed for cough. 12/10/19  Yes [provider]  cholecalciferol (VITAMIN D3) 25 MCG (1000 UNIT) tablet Take 1,000 Units by mouth daily.   Yes [provider]  hydrochlorothiazide (HYDRODIURIL) 25 MG tablet Take 50 mg by mouth daily.   Yes [provider]  lisinopril (ZESTRIL) 40 MG tablet Take 40 mg by mouth daily.   Yes [provider]  naproxen (NAPROSYN) 500 MG tablet Take 500 mg by mouth daily as needed for pain. 03/12/20  Yes [provider]  traZODone (DESYREL) 50 MG tablet Take 50 mg by mouth daily as needed for sleep (anxiety).   Yes [provider]  vitamin C (ASCORBIC ACID) 500 MG tablet Take 500 mg by mouth daily.   Yes [provider]  Zinc 25 MG TABS Take 25 mg by mouth daily.  Yes [provider]    Allergies    Patient has no known allergies.  Review of Systems   Review of Systems 10 systems reviewed and negative except as per HPI Physical Exam Updated Vital Signs BP (!) 158/85   Pulse 85   Temp 98 F (36.7 C) (Oral)   Resp 16   Ht 6\' 3"  (1.905 m)   Wt 121.1 kg   SpO2 95%   BMI 33.37 kg/m   Physical Exam Constitutional:      Comments: Patient is alert and nontoxic.  Mental status is clear.  No respiratory distress.  HENT:     Mouth/Throat:     Mouth: Mucous membranes are moist.     Pharynx: Oropharynx is clear.  Eyes:     Extraocular Movements: Extraocular movements intact.     Conjunctiva/sclera: Conjunctivae normal.     Pupils: Pupils are equal, round, and reactive to light.  Cardiovascular:     Rate and  Rhythm: Normal rate and regular rhythm.  Pulmonary:     Comments: No respiratory distress at rest.  Patient does have coarse expiratory wheeze bilateral lung fields. Abdominal:     General: There is no distension.     Palpations: Abdomen is soft.     Tenderness: There is no abdominal tenderness. There is no guarding.  Musculoskeletal:        General: Normal range of motion.     Comments: No significant lower extremity edema.  Patient does have slightly erythematous skin of the feet and lower legs.  Appears likely chronic arterial insufficiency.  The feet are warm and dry.  Patient has a erythematous, scaling area of multiple excoriations on the lateral aspect of the left lower leg.  Neurological:     General: No focal deficit present.     Mental Status: He is oriented to person, place, and time.     Cranial Nerves: No cranial nerve deficit.     Motor: No weakness.     Coordination: Coordination normal.  Psychiatric:        Mood and Affect: Mood normal.     ED Results / Procedures / Treatments   Labs (all labs ordered are listed, but only abnormal results are displayed) Labs Reviewed  COMPREHENSIVE METABOLIC PANEL - Abnormal; Notable for the following components:      Result Value   Sodium 119 (*)    Chloride 86 (*)    Glucose, Bld 123 (*)    Calcium 8.3 (*)    Albumin 3.2 (*)    All other components within normal limits  LACTIC ACID, PLASMA - Abnormal; Notable for the following components:   Lactic Acid, Venous 2.3 (*)    All other components within normal limits  CBC WITH DIFFERENTIAL/PLATELET - Abnormal; Notable for the following components:   RBC 3.51 (*)    Hemoglobin 12.1 (*)    HCT 33.0 (*)    MCH 34.5 (*)    MCHC 36.7 (*)    Platelets 412 (*)    All other components within normal limits  MAGNESIUM - Abnormal; Notable for the following components:   Magnesium 1.3 (*)    All other components within normal limits  OSMOLALITY, URINE - Abnormal; Notable for the  following components:   Osmolality, Ur 285 (*)    All other components within normal limits  CBG MONITORING, ED - Abnormal; Notable for the following components:   Glucose-Capillary 130 (*)    All other components within normal limits  I-STAT VENOUS BLOOD GAS, ED - Abnormal; Notable for the following components:   pCO2, Ven 41.2 (*)    pO2, Ven 52.0 (*)    Sodium 120 (*)    Calcium, Ion 1.01 (*)    HCT 36.0 (*)    Hemoglobin 12.2 (*)    All other components within normal limits  RESP PANEL BY RT-PCR (FLU A&B, COVID) ARPGX2  ETHANOL  LIPASE, BLOOD  BRAIN NATRIURETIC PEPTIDE  PROTIME-INR  PHOSPHORUS  TSH  LACTIC ACID, PLASMA  URINALYSIS, ROUTINE W REFLEX MICROSCOPIC  RAPID URINE DRUG SCREEN, HOSP PERFORMED  OSMOLALITY  SODIUM, URINE, RANDOM  BASIC METABOLIC PANEL  MAGNESIUM  PHOSPHORUS  TROPONIN I (HIGH SENSITIVITY)  TROPONIN I (HIGH SENSITIVITY)    EKG EKG Interpretation  Date/Time:  Saturday March 28 2020 12:57:47 EST Ventricular Rate:  78 PR Interval:    QRS Duration: 117 QT Interval:  384 QTC Calculation: 438 R Axis:   81 Text Interpretation: Sinus rhythm Prolonged PR interval Consider left atrial enlargement Nonspecific intraventricular conduction delay Minimal ST elevation, anterior leads repolarization abnormality, no STEMI. Confirmed by Arby BarrettePfeiffer, Marcy [phone]([id number]) on 03/28/2020 3:55:56 PM   Radiology DG Chest 2 View  Result Date: 03/28/2020 CLINICAL DATA:  Syncope EXAM: CHEST - 2 VIEW COMPARISON:  None. FINDINGS: Cardiomegaly. Both lungs are clear. Disc degenerative disease of the thoracic spine. IMPRESSION: Cardiomegaly without acute abnormality of the lungs. Electronically Signed   By: Lauralyn PrimesAlex  Bibbey M.D.   On: 03/28/2020 13:04   CT Head Wo Contrast  Result Date: 03/28/2020 CLINICAL DATA:  Seizure. EXAM: CT HEAD WITHOUT CONTRAST TECHNIQUE: Contiguous axial images were obtained from the base of the skull through the vertex without intravenous contrast. COMPARISON:   None. FINDINGS: Brain: No subdural, epidural, or subarachnoid hemorrhage. Cerebellum, brainstem, and basal cisterns are normal. Ventricles and sulci are unremarkable. No acute cortical ischemia or infarct. No mass, mass effect, or midline shift. Vascular: Calcified atherosclerosis in the intracranial carotids. Skull: Normal. Negative for fracture or focal lesion. Sinuses/Orbits: No acute finding. Other: None. IMPRESSION: No acute abnormalities.  No cause for seizure identified. Electronically Signed   By: Gerome Samavid  Williams III M.D   On: 03/28/2020 12:09    Procedures Procedures  CRITICAL CARE Performed by: Arby BarretteMarcy Pfeiffer   Total critical care time: 30 minutes  Critical care time was exclusive of separately billable procedures and treating other patients.  Critical care was necessary to treat or prevent imminent or life-threatening deterioration.  Critical care was time spent personally by me on the following activities: development of treatment plan with patient and/or surrogate as well as nursing, discussions with consultants, evaluation of patient's response to treatment, examination of patient, obtaining history from patient or surrogate, ordering and performing treatments and interventions, ordering and review of laboratory studies, ordering and review of radiographic studies, pulse oximetry and re-evaluation of patient's condition. Medications Ordered in ED Medications  LORazepam (ATIVAN) injection 0-4 mg (0 mg Intravenous Not Given 03/28/20 1335)    Or  LORazepam (ATIVAN) tablet 0-4 mg ( Oral See Alternative 03/28/20 1335)  LORazepam (ATIVAN) injection 0-4 mg (has no administration in time range)    Or  LORazepam (ATIVAN) tablet 0-4 mg (has no administration in time range)  thiamine tablet 100 mg ( Oral See Alternative 03/28/20 1348)    Or  thiamine (B-1) injection 100 mg (100 mg Intravenous Given 03/28/20 1348)  0.9 %  sodium chloride infusion (has no administration in time range)  0.9  %  sodium chloride infusion (  Intravenous New Bag/Given 03/28/20 1347)  magnesium sulfate IVPB 2 g 50 mL (0 g Intravenous Stopped 03/28/20 1457)  LORazepam (ATIVAN) injection 1 mg (1 mg Intravenous Given 03/28/20 1350)    ED Course  I have reviewed the triage vital signs and the nursing notes.  Pertinent labs & imaging results that were available during my care of the patient were reviewed by me and considered in my medical decision making (see chart for details).  Clinical Course as of 03/28/20 1556  Sat Mar 28, 2020  1409 Internal medicine teaching accepts for admission [MP]  1455 Vascili with internal medicine teaching service requests [facility] consult for hyponatremia. [MP]    Clinical Course User Index [MP] Arby Barrette, MD   MDM Rules/Calculators/A&P                         Consult: Reviewed critical care, will also see patient in emergency department for hyponatremia  Patient presents as outlined above.  He is found to have significant hyponatremia.  This is likely contributing factor to syncope and possible seizure activity.  Patient does have history of drinking 8+ beers per day, likely etiology for hyponatremia.  Also possible element of alcohol withdrawal.  Patient's mental status is clear.  He is not tachycardic.  He is very slightly tremulous.  Ativan, magnesium replacement, normal saline and thiamine/folate administered. Final Clinical Impression(s) / ED Diagnoses Final diagnoses:  Hyponatremia  Seizure (HCC)  Syncope and collapse  Alcohol abuse    Rx / DC Orders ED Discharge Orders    None       Arby Barrette, MD 03/28/20 1558

## 2020-03-28 NOTE — Hospital Course (Addendum)
Ronnie Dunn is a 60 y.o. with pertinent PMH of PMH of hypertension, HLD, type II DM, alcohol use, syncopal episodes of unknown etiology who presented to [facility] with acute onset seizure-like activity and admit for further evaluation seizures in the setting of hyponatremia and alcohol use disorder.   Seizure, Multifactorial: Patient presented after experiencing seizure-like activity in the setting of significant ETOH use disorder and severe hyponatremia. Patient denied any centrally acting medication or nonprescription medications which could have contributed to his presenting symptoms. CT scan/MRI   Hyponatremia  Alcohol-use disorder  Wheezing

## 2020-03-28 NOTE — Consult Note (Signed)
NAME:  Ronnie Dunn, MRN:  [id number], DOB:  [birth year], LOS: 0 ADMISSION DATE:  03/28/2020, CONSULTATION DATE:  03/28/2020 REFERRING MD:  Dr. Donnald Garre, CHIEF COMPLAINT:  Hyponatremia    Brief History:  60yo male presented with seizure like activity with observed hypotension and severe hyponatremia.   History of Present Illness:  Ronnie Dunn is a 60 y.o. male with PMH significant for HTN, daily 2PPD smoker and chronic alcohol use who presented to the ED via EMS after suffering a syncopal episode with seizure like activity post syncope. Per wife patient was found lying in bed with upper torso in bed and legs off side of bed as if he had been attempting to get up. She states that he was unresponsive during initial episode and showed signs of seizures. Wife does report patient lost function of bowel and bladder during syncopal episode. Patient and wife report similar episode 2 weeks prior to this admission.   Workup on arrival revealed severe hyponatremia with sodium of 119, hypochloridemia of 86, and positive lactic acidosis of 2.3. CT head negative. CXR with cardiomegaly without acute pulmonary abnormalities. PCCM consulted for assistance in management of hyponatremia.   Past Medical History:  HTN Daily smoker Daily alcohol use   Significant Hospital Events:  Admitted 3/12  Consults:    Procedures:    Significant Diagnostic Tests:  CT head 3/12 > negative CXR 3/12 > with cardiomegaly without acute pulmonary abnormalities  Micro Data:  Respiratory panel 3/12 > negative   Antimicrobials:     Interim History / Subjective:  Lying in bed with no acute complaints  Wife at bedside and updated   Objective   Blood pressure (!) 158/85, pulse 85, temperature 98 F (36.7 C), temperature source Oral, resp. rate 16, height 6\' 3"  (1.905 m), weight 121.1 kg, SpO2 95 %.        Intake/Output Summary (Last 24 hours) at 03/28/2020 1701 Last data filed at 03/28/2020 1656 Gross per 24 hour   Intake -  Output 450 ml  Net -450 ml   Filed Weights   03/28/20 1101  Weight: 121.1 kg    Examination: General: Middle aged male lying on ED stretcher in no acute distress HEENT: [place]/AT, MM pink/moist, PERRL,  Neuro: Alert and oriented x3, non-focal  CV: s1s2 regular rate and rhythm, no murmur, rubs, or gallops,  PULM:  Audible wheezing heard on entry to room but oxygen saturations appropriate on RA GI: soft, bowel sounds active in all 4 quadrants, non-tender, non-distended Extremities: warm/dry, no edema  Skin: red excoriated rash over lateral side of left calf   Resolved Hospital Problem list     Assessment & Plan:  Hypovolemic hyponatremia  -Na 119, Cl 86, osmolarity 257, urine osmolarity 285, urine sodium 13 P: Bolus of IV NS now  Trend Bmet q4 Neuro checks Monitor volume status  Goal to correct Na by 4-7meq over the next 12hrs   Syncope with likely post syncopal seizure -No post-ictal state -Head CT negative  P: Continuous telemetry  Obtain ECHO Optimize electrolytes  Seizure precautions   Acute COPD exacerbation   -No known diagnosis of COPD but very extensive history of tobacco abuse  Daily tobacco abuse, 2PPD  P: Prednisone 40mg  x5 days  Start Dulera and Incruse Nicotine patch if needed  Patient needs to establish with pulmonologist upon discharge  Encourage pulmonary hygiene   Hx of HTN  -Home medications includes Amlodipine, HCTZ, and Lisinopril  HX of HLD  -Home medications include  Lipitor P: Continue home medications   Daily alcohol abuse  -Reports 10-12 beers per day  P: CIWA protocol  Seizure precautions  Monitor for withdrawal symptoms    Best practice (evaluated daily)  Per primary    Labs   CBC: Recent Labs  Lab 03/28/20 1136 03/28/20 1222  WBC 7.1  --   NEUTROABS 5.7  --   HGB 12.1* 12.2*  HCT 33.0* 36.0*  MCV 94.0  --   PLT 412*  --     Basic Metabolic Panel: Recent Labs  Lab 03/28/20 1136 03/28/20 1222  03/28/20 1549  NA 119* 120* 120*  K 4.7 4.7 4.4  CL 86*  --  88*  CO2 22  --  21*  GLUCOSE 123*  --  105*  BUN 11  --  11  CREATININE 0.98  --  0.78  CALCIUM 8.3*  --  8.3*  MG 1.3*  --  2.0  PHOS 4.0  --  3.5   GFR: Estimated Creatinine Clearance: 137.6 mL/min (by C-G formula based on SCr of 0.78 mg/dL). Recent Labs  Lab 03/28/20 1136 03/28/20 1139 03/28/20 1550  WBC 7.1  --   --   LATICACIDVEN  --  2.3* 1.8    Liver Function Tests: Recent Labs  Lab 03/28/20 1136  AST 35  ALT 17  ALKPHOS 93  BILITOT 0.9  PROT 6.9  ALBUMIN 3.2*   Recent Labs  Lab 03/28/20 1136  LIPASE 51   No results for input(s): AMMONIA in the last 168 hours.  ABG    Component Value Date/Time   PHART 7.228 (L) 12/15/2006 1645   PCO2ART (LL) 12/15/2006 1645    19.5 CRITICAL RESULT CALLED TO, READ BACK BY AND VERIFIED WITH: CHARLOTTE CONWAY,RN AT 1659 BY CASEY Esquilin,RRT,RCP ON 12/15/06   PO2ART 99.1 12/15/2006 1645   HCO3 23.5 03/28/2020 1222   TCO2 25 03/28/2020 1222   ACIDBASEDEF 2.0 03/28/2020 1222   O2SAT 85.0 03/28/2020 1222     Coagulation Profile: Recent Labs  Lab 03/28/20 1136  INR 1.0    Cardiac Enzymes: No results for input(s): CKTOTAL, CKMB, CKMBINDEX, TROPONINI in the last 168 hours.  HbA1C: No results found for: HGBA1C  CBG: Recent Labs  Lab 03/28/20 1108  GLUCAP 130*    Review of Systems:   Gen: Denies fever, chills, weight change, fatigue, night sweats HEENT: Denies blurred vision, double vision, hearing loss, tinnitus, sinus congestion, rhinorrhea, sore throat, neck stiffness, dysphagia PULM: Denies shortness of breath, cough, sputum production, hemoptysis, wheezing CV: Denies chest pain, edema, orthopnea, paroxysmal nocturnal dyspnea, palpitations GI: Denies abdominal pain, nausea, vomiting, diarrhea, hematochezia, melena, constipation, change in bowel habits GU: Denies dysuria, hematuria, polyuria, oliguria, urethral discharge Endocrine: Denies hot or  cold intolerance, polyuria, polyphagia or appetite change Derm: Denies rash, dry skin, scaling or peeling skin change Heme: Denies easy bruising, bleeding, bleeding gums Neuro: Denies headache, numbness, weakness, slurred speech, loss of consciousness  Past Medical History:  He,  has no past medical history on file.   Social History:      Family History:  His family history is not on file.   Allergies No Known Allergies   Home Medications  Prior to Admission medications   Medication Sig Start Date End Date Taking? Authorizing Provider  amLODipine (NORVASC) 5 MG tablet Take 5 mg by mouth daily.   Yes [provider]  atorvastatin (LIPITOR) 10 MG tablet Take 10 mg by mouth daily.   Yes [provider]  benzonatate (TESSALON) 200 MG capsule Take 200 mg by mouth 3 (three) times daily as needed for cough. 12/10/19  Yes [provider]  cholecalciferol (VITAMIN D3) 25 MCG (1000 UNIT) tablet Take 1,000 Units by mouth daily.   Yes [provider]  hydrochlorothiazide (HYDRODIURIL) 25 MG tablet Take 50 mg by mouth daily.   Yes [provider]  lisinopril (ZESTRIL) 40 MG tablet Take 40 mg by mouth daily.   Yes [provider]  naproxen (NAPROSYN) 500 MG tablet Take 500 mg by mouth daily as needed for pain. 03/12/20  Yes [provider]  traZODone (DESYREL) 50 MG tablet Take 50 mg by mouth daily as needed for sleep (anxiety).   Yes [provider]  vitamin C (ASCORBIC ACID) 500 MG tablet Take 500 mg by mouth daily.   Yes [provider]  Zinc 25 MG TABS Take 25 mg by mouth daily.   Yes [provider]     Signature:   Delfin Gant, NP-C [facility] Personal contact information can be found on Amion  If no response please page: Adult pulmonary and critical care medicine pager on Amion unitl 7pm After 7pm please call [phone] 03/28/2020, 5:51 PM

## 2020-03-28 NOTE — ED Triage Notes (Signed)
Patient brought to ED via EMS from home with c/o seizure this AM. Witnessed by family, lasted approx 30 seconds. No hx of seizures. EMS reports pt had a similar episode last week but was not seen by MD. EMS states patient had orthostatic hypotension PTA--systolic BP decreased from 110s to 70s with sit to standing position change. 1L NS given en route. Patient alert and oriented x 4 on arrival to ED, VSS, NAD. Patient reports drinking 7-8 beers per day, current smoker.  EMS V/S: 130/60 98% room air 16 RR 173 CBG

## 2020-03-29 ENCOUNTER — Inpatient Hospital Stay (HOSPITAL_COMMUNITY): Payer: 59

## 2020-03-29 DIAGNOSIS — R569 Unspecified convulsions: Principal | ICD-10-CM

## 2020-03-29 DIAGNOSIS — I10 Essential (primary) hypertension: Secondary | ICD-10-CM

## 2020-03-29 DIAGNOSIS — R062 Wheezing: Secondary | ICD-10-CM

## 2020-03-29 DIAGNOSIS — E669 Obesity, unspecified: Secondary | ICD-10-CM

## 2020-03-29 DIAGNOSIS — E871 Hypo-osmolality and hyponatremia: Secondary | ICD-10-CM | POA: Diagnosis present

## 2020-03-29 DIAGNOSIS — R55 Syncope and collapse: Secondary | ICD-10-CM

## 2020-03-29 DIAGNOSIS — F1020 Alcohol dependence, uncomplicated: Secondary | ICD-10-CM | POA: Diagnosis present

## 2020-03-29 DIAGNOSIS — R21 Rash and other nonspecific skin eruption: Secondary | ICD-10-CM

## 2020-03-29 HISTORY — DX: Essential (primary) hypertension: I10

## 2020-03-29 HISTORY — DX: Hypo-osmolality and hyponatremia: E87.1

## 2020-03-29 HISTORY — DX: Obesity, unspecified: E66.9

## 2020-03-29 LAB — ECHOCARDIOGRAM COMPLETE
Height: 74.4 in
S' Lateral: 3.1 cm
Weight: 4272 oz

## 2020-03-29 LAB — BASIC METABOLIC PANEL
Anion gap: 8 (ref 5–15)
Anion gap: 10 (ref 5–15)
Anion gap: 11 (ref 5–15)
BUN: 7 mg/dL (ref 6–20)
BUN: 8 mg/dL (ref 6–20)
BUN: 6 mg/dL (ref 6–20)
CO2: 22 mmol/L (ref 22–32)
CO2: 20 mmol/L — ABNORMAL LOW (ref 22–32)
CO2: 18 mmol/L — ABNORMAL LOW (ref 22–32)
Calcium: 8.5 mg/dL — ABNORMAL LOW (ref 8.9–10.3)
Calcium: 8.6 mg/dL — ABNORMAL LOW (ref 8.9–10.3)
Calcium: 8.8 mg/dL — ABNORMAL LOW (ref 8.9–10.3)
Chloride: 97 mmol/L — ABNORMAL LOW (ref 98–111)
Chloride: 95 mmol/L — ABNORMAL LOW (ref 98–111)
Chloride: 98 mmol/L (ref 98–111)
Creatinine, Ser: 0.69 mg/dL (ref 0.61–1.24)
Creatinine, Ser: 0.79 mg/dL (ref 0.61–1.24)
Creatinine, Ser: 0.86 mg/dL (ref 0.61–1.24)
GFR, Estimated: >60 mL/min (ref >60)
GFR, Estimated: >60 mL/min (ref >60)
GFR, Estimated: >60 mL/min (ref >60)
Glucose, Bld: 99 mg/dL (ref 70–99)
Glucose, Bld: 159 mg/dL — ABNORMAL HIGH (ref 70–99)
Glucose, Bld: 166 mg/dL — ABNORMAL HIGH (ref 70–99)
Potassium: 3.9 mmol/L (ref 3.5–5.1)
Potassium: 4.5 mmol/L (ref 3.5–5.1)
Potassium: 4.2 mmol/L (ref 3.5–5.1)
Sodium: 127 mmol/L — ABNORMAL LOW (ref 135–145)
Sodium: 125 mmol/L — ABNORMAL LOW (ref 135–145)
Sodium: 127 mmol/L — ABNORMAL LOW (ref 135–145)

## 2020-03-29 LAB — COMPREHENSIVE METABOLIC PANEL
ALT: 15 U/L (ref 0–44)
AST: 31 U/L (ref 15–41)
Albumin: 2.9 g/dL — ABNORMAL LOW (ref 3.5–5.0)
Alkaline Phosphatase: 81 U/L (ref 38–126)
Anion gap: 8 (ref 5–15)
BUN: 8 mg/dL (ref 6–20)
CO2: 21 mmol/L — ABNORMAL LOW (ref 22–32)
Calcium: 8.3 mg/dL — ABNORMAL LOW (ref 8.9–10.3)
Chloride: 96 mmol/L — ABNORMAL LOW (ref 98–111)
Creatinine, Ser: 0.72 mg/dL (ref 0.61–1.24)
GFR, Estimated: >60 mL/min (ref >60)
Glucose, Bld: 103 mg/dL — ABNORMAL HIGH (ref 70–99)
Potassium: 3.9 mmol/L (ref 3.5–5.1)
Sodium: 125 mmol/L — ABNORMAL LOW (ref 135–145)
Total Bilirubin: 1 mg/dL (ref 0.3–1.2)
Total Protein: 6.2 g/dL — ABNORMAL LOW (ref 6.5–8.1)

## 2020-03-29 LAB — CBC
HCT: 29 % — ABNORMAL LOW (ref 39.0–52.0)
Hemoglobin: 10.7 g/dL — ABNORMAL LOW (ref 13.0–17.0)
MCH: 34.3 pg — ABNORMAL HIGH (ref 26.0–34.0)
MCHC: 36.9 g/dL — ABNORMAL HIGH (ref 30.0–36.0)
MCV: 92.9 fL (ref 80.0–100.0)
Platelets: 411 10*3/uL — ABNORMAL HIGH (ref 150–400)
RBC: 3.12 MIL/uL — ABNORMAL LOW (ref 4.22–5.81)
RDW: 11.7 % (ref 11.5–15.5)
WBC: 5.7 10*3/uL (ref 4.0–10.5)
nRBC: 0 % (ref 0.0–0.2)

## 2020-03-29 LAB — MAGNESIUM: Magnesium: 1.8 mg/dL (ref 1.7–2.4)

## 2020-03-29 LAB — PHOSPHORUS: Phosphorus: 2.9 mg/dL (ref 2.5–4.6)

## 2020-03-29 MED ORDER — SODIUM CHLORIDE 0.9 % IV BOLUS
1000.0000 mL | Freq: Once | INTRAVENOUS | Status: AC
  Administered 2020-03-29: 1000 mL via INTRAVENOUS

## 2020-03-29 MED ORDER — DIAZEPAM 2 MG PO TABS: 2.0000 mg | ORAL_TABLET | Freq: Once | ORAL | Status: DC

## 2020-03-29 MED ORDER — DIAZEPAM 2 MG PO TABS
2.0000 mg | ORAL_TABLET | Freq: Once | ORAL | Status: AC | PRN
  Administered 2020-03-29: 2 mg via ORAL
  Filled 2020-03-29: qty 1

## 2020-03-29 NOTE — Discharge Summary (Incomplete)
   Name: Ronnie Dunn MRN: [id number] DOB: [birth year] 61 y.o. PCP: Patient, No Pcp Per  Date of Admission: 03/28/2020 10:53 AM Date of Discharge:  Attending Physician: Tyson Alias, *  Discharge Diagnosis: 1. ***  Discharge Medications: Allergies as of 03/29/2020   No Known Allergies     Medication List    STOP taking these medications   benzonatate 200 MG capsule Commonly known as: TESSALON   hydrochlorothiazide 25 MG tablet Commonly known as: HYDRODIURIL   vitamin C 500 MG tablet Commonly known as: ASCORBIC ACID   Zinc 25 MG Tabs     TAKE these medications   amLODipine 5 MG tablet Commonly known as: NORVASC Take 5 mg by mouth daily.   atorvastatin 10 MG tablet Commonly known as: LIPITOR Take 10 mg by mouth daily.   cholecalciferol 25 MCG (1000 UNIT) tablet Commonly known as: VITAMIN D3 Take 1,000 Units by mouth daily.   lisinopril 40 MG tablet Commonly known as: ZESTRIL Take 40 mg by mouth daily.   naproxen 500 MG tablet Commonly known as: NAPROSYN Take 500 mg by mouth daily as needed for pain.   traZODone 50 MG tablet Commonly known as: DESYREL Take 50 mg by mouth daily as needed for sleep (anxiety).       Disposition and follow-up:   Mr.Denham R Klumpp was discharged from [facility] in {DISCHARGE CONDITION:19696} condition.  At the hospital follow up visit please address:  1.  ***  2.  Labs / imaging needed at time of follow-up: ***  3.  Pending labs/ test needing follow-up: ***  Follow-up Appointments:  Follow-up Information    Luciano Cutter, MD. Call in 1 week(s).   Specialty: Pulmonary Disease Contact information: [address] [place] [address] [phone]               Hospital Course by problem list: 1. ***  Discharge Exam:   BP (!) 155/85 (BP Location: Left Arm)   Pulse 97   Temp 98 F (36.7 C) (Oral)   Resp 18   Ht 6' 2.4" (1.89 m)   Wt 121.1 kg   SpO2 100%   BMI  33.91 kg/m  Discharge exam: ***  Pertinent Labs, Studies, and Procedures:  ***  Discharge Instructions: Discharge Instructions    Diet - low sodium heart healthy   Complete by: As directed    Increase activity slowly   Complete by: As directed       Signed: Albertha Ghee, MD 03/29/2020, 6:29 PM   Pager: @[email or web address] @

## 2020-03-29 NOTE — Evaluation (Signed)
Physical Therapy Evaluation: One-time Eval Patient Details Name: Ronnie Dunn MRN: [id number] DOB: July 29, 1960 Today's Date: 03/29/2020   History of Present Illness  60 yo male admitted to ED on 3/12 with syncopal episode, seizure-like activity post-syncope with + urinary/fecal incontinence during. CXR and CTH negative for acute findings. workup for severe hyponatremia. PMH includes HTN, daily 2PPD smoker with workup for COPD, and chronic alcohol use (10-12 beers/day).  Clinical Impression   Pt presents with [facility] strength, balance, gait, and activity tolerance. Pt ambulatory for great hallway distance without use of AD, is very independent with mobility and wishes to remain so upon d/c. Pt noted to have mild dyspnea during gait, SPO2 96-97% on RA, RN notified. PT with no follow up recommendations, will sign off as pt with no further PT needs.     Follow Up Recommendations No PT follow up    Equipment Recommendations  None recommended by PT    Recommendations for Other Services       Precautions / Restrictions Precautions Precautions: Fall Restrictions Weight Bearing Restrictions: No      Mobility  Bed Mobility Overal bed mobility: Independent             General bed mobility comments: no physical assist    Transfers Overall transfer level: Independent               General transfer comment: no physical assist  Ambulation/Gait Ambulation/Gait assistance: Modified independent (Device/Increase time) Gait Distance (Feet): 450 Feet Assistive device: None Gait Pattern/deviations: Step-through pattern;WFL(Within Functional Limits) Gait velocity: decr initially, transitioning to [facility]   General Gait Details: mod I for initially increased time, no physical assist  Stairs            Wheelchair Mobility    Modified Rankin (Stroke Patients Only)       Balance Overall balance assessment: No apparent balance deficits (not formally assessed)                                            Pertinent Vitals/Pain Pain Assessment: No/denies pain    Home Living Family/patient expects to be discharged to:: Private residence Living Arrangements: Spouse/significant other Available Help at Discharge: Family Type of Home: House Home Access: Stairs to enter   Secretary/administrator of Steps: 2 Home Layout: One level Home Equipment: None      Prior Function Level of Independence: Independent         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   Dominant Hand: Right    Extremity/Trunk Assessment   Upper Extremity Assessment Upper Extremity Assessment: Overall WFL for tasks assessed    Lower Extremity Assessment Lower Extremity Assessment: Overall WFL for tasks assessed    Cervical / Trunk Assessment Cervical / Trunk Assessment: Normal  Communication   Communication: No difficulties  Cognition Arousal/Alertness: Awake/alert Behavior During Therapy: WFL for tasks assessed/performed Overall Cognitive Status: Within Functional Limits for tasks assessed                                        General Comments General comments (skin integrity, edema, etc.): vss, SPO2 96-97% on RA during gait with mild DOE noted    Exercises     Assessment/Plan    PT Assessment Patent  does not need any further PT services  PT Problem List         PT Treatment Interventions      PT Goals (Current goals can be found in the Care Plan section)  Acute Rehab PT Goals Patient Stated Goal: go home PT Goal Formulation: With patient/family Time For Goal Achievement: 03/29/20 Potential to Achieve Goals: Good    Frequency     Barriers to discharge        Co-evaluation               AM-PAC PT "6 Clicks" Mobility  Outcome Measure Help needed turning from your back to your side while in a flat bed without using bedrails?: None Help needed moving from lying on your back to sitting on the side of a flat  bed without using bedrails?: None Help needed moving to and from a bed to a chair (including a wheelchair)?: None Help needed standing up from a chair using your arms (e.g., wheelchair or bedside chair)?: None Help needed to walk in hospital room?: None Help needed climbing 3-5 steps with a railing? : None 6 Click Score: 24    End of Session   Activity Tolerance: Patient tolerated treatment well Patient left: in bed;with call bell/phone within reach;with family/visitor present Nurse Communication: Mobility status PT Visit Diagnosis: Other abnormalities of gait and mobility (R26.89)    Time: 1540-0867 PT Time Calculation (min) (ACUTE ONLY): 12 min   Charges:   PT Evaluation $PT Eval Low Complexity: 1 Low         Ronnie Dunn S, PT Acute Rehabilitation Services Pager [phone]  Office [phone]  Truddie Coco 03/29/2020, 2:44 PM

## 2020-03-29 NOTE — Progress Notes (Signed)
HD#1 Subjective:  Overnight Events: Admitted yesterday, sodium proved to 125 mmol/L  Patient evaluated at bedside today during rounding, wife at bedside.  Patient states he feels well and is ready to go home.  Discussed with him that we suspect his underlying seizures/passing out spells are due to many medical problems: Diuretic use, alcohol use, lack of p.o. intake.  Discussed with patient that we recommend he be evaluated further for his hyponatremia, patient states he will consider staying today.  Patient states he has a new primary care provider in [place] who he will be establishing care with him in the upcoming months.  Objective:  Vital signs in last 24 hours: Vitals:   03/29/20 0530 03/29/20 0730 03/29/20 0752 03/29/20 1211  BP: (!) 144/91 (!) 161/88  (!) 150/77  Pulse: 100 (!) 106  100  Resp: 18 20  14   Temp: 97.9 F (36.6 C) 97.9 F (36.6 C)  97.6 F (36.4 C)  TempSrc: Oral Oral  Oral  SpO2: 96% 96% 93% 98%  Weight:      Height:       Supplemental O2: Room air SpO2: 98 %  Physical Exam:  Constitutional: Awake alert, resting in bed. HENT: normocephalic atraumatic, mucous membranes moist.  Rosy cheeks  Eyes: conjunctiva non-erythematous Neck: supple Cardiovascular: regular rate and rhythm, no m/r/g Pulmonary/Chest: normal work of breathing on room air, lungs clear to auscultation bilaterally Abdominal: soft, non-tender, non-distended.  Hepatomegaly MSK: normal bulk and tone.  Gynecomastia Neurological: alert & oriented x 3, normal gait Skin: warm and dry.  Cutaneous telangiectasia. Psych: Normal mood  Filed Weights   03/28/20 1101  Weight: 121.1 kg     Intake/Output Summary (Last 24 hours) at 03/29/2020 1224 Last data filed at 03/28/2020 2220 Gross per 24 hour  Intake 204 ml  Output 950 ml  Net -746 ml   Net IO Since Admission: -746 mL [03/29/20 1224]  Pertinent Labs: CBC Latest Ref Rng & Units 03/29/2020 03/28/2020 03/28/2020  WBC 4.0 - 10.5 K/uL 5.7 -  7.1  Hemoglobin 13.0 - 17.0 g/dL 10.7(L) 12.2(L) 12.1(L)  Hematocrit 39.0 - 52.0 % 29.0(L) 36.0(L) 33.0(L)  Platelets 150 - 400 K/uL 411(H) - 412(H)    CMP Latest Ref Rng & Units 03/29/2020 03/29/2020 03/29/2020  Glucose 70 - 99 mg/dL 03/31/2020) 99 562(B)  BUN 6 - 20 mg/dL 8 7 8   Creatinine 0.61 - 1.24 mg/dL 638(L 3.73  Sodium 135 - 145 mmol/L 125(L) 127(L) 125(L)  Potassium 3.5 - 5.1 mmol/L 4.5 3.9 3.9  Chloride 98 - 111 mmol/L 95(L) 97(L) 96(L)  CO2 22 - 32 mmol/L 20(L) 22 21(L)  Calcium 8.9 - 10.3 mg/dL 4.28) 7.68) 8.3(L)  Total Protein 6.5 - 8.1 g/dL - - 6.2(L)  Total Bilirubin 0.3 - 1.2 mg/dL - - 1.0  Alkaline Phos 38 - 126 U/L - - 81  AST 15 - 41 U/L - - 31  ALT 0 - 44 U/L - - 15    Imaging: DG Chest 2 View  Result Date: 03/28/2020 CLINICAL DATA:  Syncope EXAM: CHEST - 2 VIEW COMPARISON:  None. FINDINGS: Cardiomegaly. Both lungs are clear. Disc degenerative disease of the thoracic spine. IMPRESSION: Cardiomegaly without acute abnormality of the lungs. Electronically Signed   By: 7.2(I M.D.   On: 03/28/2020 13:04    Assessment/Plan:   Principal Problem:   Seizure (HCC) Active Problems:   Hyponatremia   Alcohol use disorder, moderate, dependence (HCC)   Obesity   Essential  hypertension   Patient Summary: Ronnie Dunn is a 60 y.o. with pertinent PMH of PMH of hypertension, HLD, type II DM, alcohol use, syncopal episodes of unknown etiology who presented syncopal episode with seizure-like activity and admit for hyponatremia, seizure work-up, alcohol use disorder on hospital day 0  Hx of Syncopal Episodes Suspected Seizure Patient without seizures or syncopal episodes since being admitted.  His two most recent episodes of syncope with shaking spells, does seem consistent with seizures.  He has multiple reasons to have a lowered seizure threshold, his alcohol use as well as hyponatremia.  Discussed with him that it will be important to discontinue his alcohol use.   His syncopal episodes are consistent with being secondary to dehydration, they do occur when he is outside working.  Low suspicion for structural disease, however, will order MRI. -Discussed alcohol cessation with the patient -MRI brain without contrast pending -Continue correct sodium as per below -We will hold antihypertensives during his admission and at discharge -Echo pending -Correct hyponatremia as per below -CIWA protocol  Hypovolemic hyponatremia  Hyponatremia has improved to 125.  Serum osmolality 257, urine osmolality of 285, urine sodium 13.  Consistent with hypovolemic hyponatremia IV fluids were discontinued.  We will continue to trend every 6 hours BMPs -BMP q6h's -Encourage p.o. intake -If patient's sodium does not improve, consider restarting IV fluids  Alcohol Use Hypomagnesemia-resolved Patient without scoring of CIWA.  Discussion had between patient and IMTS concerning his frequent alcohol use disorder chronic liver disease.  Patient was offered naltrexone therapy by Dr. Oswaldo Done however the patient declined. -CIWA protocol with Ativan -We will provide resources for outpatient substance use programs -Continue thiamine, multivitamin, and folate.  Wheezing Patient with no formal diagnosis of COPD but in setting of wheezing and extensive tobacco use history, would expect some component of obstructive pulmonary disease. -Dulera 2 puffs twice daily, prednisone 40 mg daily, Incruse Ellipta 1 puff daily -Patient will need pulmonary outpatient follow-up for PFTs  Left lower extremity rash Patient continues to endorse rash left lower extremity. -Calamine lotion twice daily  Hypertension History of hypertension, patient takes amlodipine 5 mg daily, HCTZ 25 mg daily, lisinopril 40 mg daily. -We will hold at discharge in setting of patient's hypovolemic state.  Patient will need to follow-up with primary care provider for restarting his medications  Diet: Normal IVF:  None,None VTE: Enoxaparin Code: Full PT/OT recs: Pending, none. Family Update: Wife at bedside and updated of plan   Dispo: Anticipated discharge to Home in 2 to 3 days pending further work-up  Thalia Bloodgood DO Internal Medicine Resident PGY-1 Pager [phone] Please contact the on call pager after 5 pm and on weekends at [phone].

## 2020-03-29 NOTE — Progress Notes (Signed)
NAME:  Ronnie Dunn, MRN:  [id number], DOB:  08-Jan-1961, LOS: 1 ADMISSION DATE:  03/28/2020, CONSULTATION DATE:  03/28/2020 REFERRING MD:  Dr. Donnald Garre, CHIEF COMPLAINT:  Hyponatremia    Brief History:  60yo male presented with seizure like activity with observed hypotension and severe hyponatremia.   History of Present Illness:  Ronnie Dunn is a 60 y.o. male with PMH significant for HTN, daily 2PPD smoker and chronic alcohol use who presented to the ED via EMS after suffering a syncopal episode with seizure like activity post syncope. Per wife patient was found lying in bed with upper torso in bed and legs off side of bed as if he had been attempting to get up. She states that he was unresponsive during initial episode and showed signs of seizures. Wife does report patient lost function of bowel and bladder during syncopal episode. Patient and wife report similar episode 2 weeks prior to this admission.   Workup on arrival revealed severe hyponatremia with sodium of 119, hypochloridemia of 86, and positive lactic acidosis of 2.3. CT head negative. CXR with cardiomegaly without acute pulmonary abnormalities. PCCM consulted for assistance in management of hyponatremia.   Past Medical History:  HTN Daily smoker Daily alcohol use   Significant Hospital Events:  Admitted 3/12  Consults:    Procedures:    Significant Diagnostic Tests:  CT head 3/12 > negative CXR 3/12 > with cardiomegaly without acute pulmonary abnormalities  Micro Data:  Respiratory panel 3/12 > negative   Antimicrobials:     Interim History / Subjective:  States he did not rest well, last night.  Denies any alcohol or nicotine cravings Asks when he would be ready for discharge   Objective   Blood pressure (!) 161/88, pulse (!) 106, temperature 97.9 F (36.6 C), temperature source Oral, resp. rate 20, height 6' 2.4" (1.89 m), weight 121.1 kg, SpO2 93 %.        Intake/Output Summary (Last 24 hours) at  03/29/2020 0842 Last data filed at 03/28/2020 2220 Gross per 24 hour  Intake 204 ml  Output 950 ml  Net -746 ml   Filed Weights   03/28/20 1101  Weight: 121.1 kg    Examination: General: Middle aged male lying in bed in NAD HEENT: [place]/AT, MM pink/moist, PERRL,  Neuro: Alert and oriented x3, non-focal, no tremors, no lightheadedness  CV: s1s2 regular rate and rhythm, no murmur, rubs, or gallops,  PULM: Diminished air entry bilaterally, Oxygen sats appropriate on RA, no increased work of breathing GI: soft, bowel sounds active in all 4 quadrants, non-tender, non-distended Extremities: warm/dry, no edema  Skin: Red excoriated rash over lateral side of left calf   Resolved Hospital Problem list     Assessment & Plan:  Hypovolemic hyponatremia  -Na 119, Cl 86, osmolarity 257, urine osmolarity 285, urine sodium 13 P: Na improved to 127 this am No further fluid bolus  Continue to trend Bmet  Neuro checks Seizure precautions   Syncope with likely post syncopal seizure -No post-ictal state -Head CT negative  P: No cardiac arrhythmia seen on telemetry overnight Follow ECHO  Optimize electrolytes  Check orthostatic blood pressure  Seizure precautions   Acute COPD exacerbation   -No known diagnosis of COPD but very extensive history of tobacco abuse  Daily tobacco abuse, 2PPD  P: Wheezing improved on assessment today  Continue short steroid course  Continue Dulera and Incruse Cessation education provided  Encourage pulmonary hygiene  Nicotine patch as needed  Patient  need to establish with a pulmonologist at discharge   Hx of HTN  -Home medications includes Amlodipine, HCTZ, and Lisinopril  HX of HLD  -Home medications include Lipitor P: Management pet primary   Daily alcohol abuse  -Reports 10-12 beers per day  P: CIWA protocol  No PRN Ativan required overnight  Monitor for signs of withdrawal  Cessation education provided   PCCM will sign off. Thank you for  the opportunity to participate in this patient's care. Please contact if we can be of further assistance.   Best practice (evaluated daily)  Per primary    Labs   CBC: Recent Labs  Lab 03/28/20 1136 03/28/20 1222 03/29/20 0328  WBC 7.1  --  5.7  NEUTROABS 5.7  --   --   HGB 12.1* 12.2* 10.7*  HCT 33.0* 36.0* 29.0*  MCV 94.0  --  92.9  PLT 412*  --  411*    Basic Metabolic Panel: Recent Labs  Lab 03/28/20 1136 03/28/20 1222 03/28/20 1549 03/28/20 2154 03/29/20 0328 03/29/20 0712  NA 119* 120* 120* 122* 125* 127*  K 4.7 4.7 4.4 4.2 3.9 3.9  CL 86*  --  88* 91* 96* 97*  CO2 22  --  21* 23 21* 22  GLUCOSE 123*  --  105* 96 103* 99  BUN 11  --  11 9 8 7   CREATININE 0.98  --  0.78 0.74 0.72 0.69  CALCIUM 8.3*  --  8.3* 8.1* 8.3* 8.5*  MG 1.3*  --  2.0  --   --  1.8  PHOS 4.0  --  3.5  --   --  2.9   GFR: Estimated Creatinine Clearance: 136.5 mL/min (by C-G formula based on SCr of 0.69 mg/dL). Recent Labs  Lab 03/28/20 1136 03/28/20 1139 03/28/20 1550 03/29/20 0328  WBC 7.1  --   --  5.7  LATICACIDVEN  --  2.3* 1.8  --     Liver Function Tests: Recent Labs  Lab 03/28/20 1136 03/29/20 0328  AST 35 31  ALT 17 15  ALKPHOS 93 81  BILITOT 0.9 1.0  PROT 6.9 6.2*  ALBUMIN 3.2* 2.9*   Recent Labs  Lab 03/28/20 1136  LIPASE 51   No results for input(s): AMMONIA in the last 168 hours.  ABG    Component Value Date/Time   PHART 7.228 (L) 12/15/2006 1645   PCO2ART (LL) 12/15/2006 1645    19.5 CRITICAL RESULT CALLED TO, READ BACK BY AND VERIFIED WITH: CHARLOTTE CONWAY,RN AT 1659 BY CASEY Vandervort,RRT,RCP ON 12/15/06   PO2ART 99.1 12/15/2006 1645   HCO3 23.5 03/28/2020 1222   TCO2 25 03/28/2020 1222   ACIDBASEDEF 2.0 03/28/2020 1222   O2SAT 85.0 03/28/2020 1222     Coagulation Profile: Recent Labs  Lab 03/28/20 1136  INR 1.0    Cardiac Enzymes: No results for input(s): CKTOTAL, CKMB, CKMBINDEX, TROPONINI in the last 168 hours.  HbA1C: No results  found for: HGBA1C  CBG: Recent Labs  Lab 03/28/20 1108  GLUCAP 130*    Signature:   05/28/20, NP-C [facility] Personal contact information can be found on Amion  If no response please page: Adult pulmonary and critical care medicine pager on Amion unitl 7pm After 7pm please call [phone] 03/29/2020, 8:42 AM

## 2020-03-29 NOTE — Progress Notes (Signed)
Patient reports he would like to leave. His sodium is 127 on recent BMP He reports he feels fine and would like to leave. His wife is bedside and is going to drive.  His MRI read is still pending, I will asked Dr.Katsadouros to follow-up with patient via telephone tomorrow Patient was discharged with instruction to follow-up with his new primary care doctor.  HCTZ held at discharged

## 2020-03-29 NOTE — Care Plan (Signed)
"  Standard seizure precautions: Per [facility] statutes, patients with seizures are not allowed to drive until  they have been seizure-free for six months. Use caution when using heavy equipment or power tools. Avoid working on ladders or at heights. Take showers instead of baths. Ensure the water temperature is not too high on the home water heater. Do not go swimming alone. When caring for infants or small children, sit down when holding, feeding, or changing them to minimize risk of injury to the child in the event you have a seizure.  To reduce risk of seizures, maintain good sleep hygiene avoid alcohol and illicit drug use, take all anti-seizure medications as prescribed."  Curbside consult regarding seizure precautions, as documented above.  Brooke Dare MD-PhD [facility] [phone]

## 2020-03-29 NOTE — Progress Notes (Signed)
OT Cancellation Note  Patient Details Name: Ronnie Dunn MRN: [id number] DOB: [birth year]   Cancelled Treatment:    Reason Eval/Treat Not Completed: OT screened, no needs identified, will sign off  Pt up ad lib in room wanting to go home. Per RN, pt is independent.  No OT needs identified at this time.  Flora Lipps, OTR/L Acute Rehabilitation Services Pager: [phone] Office: [phone]   Ronnie Dunn C 03/29/2020, 2:00 PM

## 2020-03-30 NOTE — Care Management (Signed)
ED CM received call from patient and wife concerning a prescription. Message was sent to discharging physician to follow up with patient.

## 2020-03-30 NOTE — Discharge Summary (Signed)
Name: Ronnie Dunn MRN: [id number] DOB: [birth year] 60 y.o. PCP: Patient, No Pcp Per  Date of Admission: 03/28/2020 10:53 AM Date of Discharge: 03/29/2020  Attending Physician: Dr. Oswaldo Done  DISCHARGE DIAGNOSIS:  Principal Problem:   Seizure [facility]) Active Problems:   Hyponatremia   Alcohol use disorder, moderate, dependence (HCC)   Obesity   Essential hypertension    DISCHARGE MEDICATIONS:   Allergies as of 03/29/2020   No Known Allergies     Medication List    STOP taking these medications   benzonatate 200 MG capsule Commonly known as: TESSALON   hydrochlorothiazide 25 MG tablet Commonly known as: HYDRODIURIL   vitamin C 500 MG tablet Commonly known as: ASCORBIC ACID   Zinc 25 MG Tabs     TAKE these medications   amLODipine 5 MG tablet Commonly known as: NORVASC Take 5 mg by mouth daily.   atorvastatin 10 MG tablet Commonly known as: LIPITOR Take 10 mg by mouth daily.   cholecalciferol 25 MCG (1000 UNIT) tablet Commonly known as: VITAMIN D3 Take 1,000 Units by mouth daily.   lisinopril 40 MG tablet Commonly known as: ZESTRIL Take 40 mg by mouth daily.   naproxen 500 MG tablet Commonly known as: NAPROSYN Take 500 mg by mouth daily as needed for pain.   traZODone 50 MG tablet Commonly known as: DESYREL Take 50 mg by mouth daily as needed for sleep (anxiety).       DISPOSITION AND FOLLOW-UP:  Ronnie Dunn discharged from [facility] in [place] condition.  At the hospital follow up visit please address:  Follow-up Recommendations:  A. Seizures: Recommendations: Avoid driving for 6 months.  Follow-up with neurology/PCP.  Work on nutrition and alcohol cessation. Pending work-up: Referral for nutrition education Labs: Mg, Phos, CBC and Comprehensive Metabolic Panel, Imaging: none  B. Hyponatremia: Recommendations: Needs his metabolic panel reached.  Pending work-up: none Labs: same as above, Imaging: none  C.  Wheezing: Recommendations: PCP follow up for diagnosis and medication management of likely COPD Pending work-up: PFTs and Lung Cancer Screening CT if applicable. Labs: none, Imaging: CT lose dose  D. HTN: Recommendations: close follow up with PCP and medication education and titration.  Pending work-up: titrate medications. Labs: none, Imaging: none  Important changes since admission: Patient should refrain from driving for 6 month due to recent seizure.   Follow-up Appointments:  Follow-up Information    Luciano Cutter, MD. Call in 1 week(s).   Specialty: Pulmonary Disease Contact information: [address] [place] [address] [phone]               HOSPITAL COURSE:   Patient Summary: Ronnie Dunn a 60 y.o.with pertinent PMH of PMH of hypertension,HLD, type II DM, alcohol use,syncopal episodes of unknown etiologywho presented syncopal episode with seizure-like activityand admit for hyponatremia, seizure work-up, alcohol use disorder  Seizure: Hypovolemic hyponatremia: ETOH use disorder: Patient presented after seizure-like activity.  He Dunn found to have a severe hyponatremia of 119 and alcohol use disorder history.  Patient Dunn hyponatremic is deemed to be secondary to hypovolemic hyponatremia in the setting of decreased p.o. intake and recent vomiting and diarrhea episodes.  Critical care Dunn consulted for hypertonic saline Dunn deemed not a candidate.  Patient's sodium elevated to 127 within the first 24 hours.  He Dunn also put on CIWA with Ativan.  His CIWA scores remained 0 throughout his hospitalization.  Patient Dunn counseled on his seizures encouraged him  to stay for another 24 hours to ensure that his sodium normalized appropriately and to prevent him from having dangerous complications from alcohol withdrawal.  We informed him of the risks and the benefit of leaving prior to finishing his treatment course.  He decided that he wanted to  leave early anyways.  Patient Dunn discharged encouraged to follow-up with PCP.   Wheezing Patient presented with signs and symptoms concerning for COPD including wheezing.  Patient has a extensive tobacco use disorder history.  She Dunn started on Dulera Incruse Ellipta and prednisone.  Patient left prior to completing his course of steroid.  He Dunn discharged with Elwin Sleight and Incruse Ellipta and counseled to follow-up with pulmonology.  Hypertension History of hypertension, patient takes amlodipine 5 mg daily, HCTZ 25 mg daily, lisinopril 40 mg daily.  Patient takes his medications inconsistently and apparently takes hydrochlorothiazide twice daily.  This could have contributed to his hyponatremia.  He will need further medication counseling and titration in the outpatient setting with PCP.   DISCHARGE INSTRUCTIONS:   Discharge Instructions    Diet - low sodium heart healthy   Complete by: As directed    Increase activity slowly   Complete by: As directed       SUBJECTIVE:  Discharge Vitals:   BP (!) 155/85 (BP Location: Left Arm)    Pulse 97    Temp 98 F (36.7 C) (Oral)    Resp 18    Ht 6' 2.4" (1.89 m)    Wt 121.1 kg    SpO2 100%    BMI 33.91 kg/m   OBJECTIVE:     Pertinent Labs, Studies, and Procedures:  CBC Latest Ref Rng & Units 03/29/2020 03/28/2020 03/28/2020  WBC 4.0 - 10.5 K/uL 5.7 - 7.1  Hemoglobin 13.0 - 17.0 g/dL 10.7(L) 12.2(L) 12.1(L)  Hematocrit 39.0 - 52.0 % 29.0(L) 36.0(L) 33.0(L)  Platelets 150 - 400 K/uL 411(H) - 412(H)    CMP Latest Ref Rng & Units 03/29/2020 03/29/2020 03/29/2020  Glucose 70 - 99 mg/dL 557(D) 220(U) 99  BUN 6 - 20 mg/dL 6 8 7   Creatinine 0.61 - 1.24 mg/dL 5.42 7.06  Sodium 135 - 145 mmol/L 127(L) 125(L) 127(L)  Potassium 3.5 - 5.1 mmol/L 4.2 4.5 3.9  Chloride 98 - 111 mmol/L 98 95(L) 97(L)  CO2 22 - 32 mmol/L 18(L) 20(L) 22  Calcium 8.9 - 10.3 mg/dL 2.37) 6.2(G) 3.1(D)  Total Protein 6.5 - 8.1 g/dL - - -  Total Bilirubin 0.3 - 1.2  mg/dL - - -  Alkaline Phos 38 - 126 U/L - - -  AST 15 - 41 U/L - - -  ALT 0 - 44 U/L - - -    DG Chest 2 View  Result Date: 03/28/2020 CLINICAL DATA:  Syncope EXAM: CHEST - 2 VIEW COMPARISON:  None. FINDINGS: Cardiomegaly. Both lungs are clear. Disc degenerative disease of the thoracic spine. IMPRESSION: Cardiomegaly without acute abnormality of the lungs. Electronically Signed   By: 05/28/2020 M.D.   On: 03/28/2020 13:04   CT Head Wo Contrast  Result Date: 03/28/2020 CLINICAL DATA:  Seizure. EXAM: CT HEAD WITHOUT CONTRAST TECHNIQUE: Contiguous axial images were obtained from the base of the skull through the vertex without intravenous contrast. COMPARISON:  None. FINDINGS: Brain: No subdural, epidural, or subarachnoid hemorrhage. Cerebellum, brainstem, and basal cisterns are normal. Ventricles and sulci are unremarkable. No acute cortical ischemia or infarct. No mass, mass effect, or midline shift. Vascular: Calcified atherosclerosis in the intracranial  carotids. Skull: Normal. Negative for fracture or focal lesion. Sinuses/Orbits: No acute finding. Other: None. IMPRESSION: No acute abnormalities.  No cause for seizure identified. Electronically Signed   By: Gerome Sam III M.D   On: 03/28/2020 12:09   MR BRAIN WO CONTRAST  Result Date: 03/29/2020 CLINICAL DATA:  Seizure. EXAM: MRI HEAD WITHOUT CONTRAST TECHNIQUE: Multiplanar, multiecho pulse sequences of the brain and surrounding structures were obtained without intravenous contrast. COMPARISON:  Head CT 03/28/2020 FINDINGS: The patient Dunn coughing, and the study is mildly motion degraded throughout despite attempts at repeat imaging. Brain: There is no evidence of an acute infarct, intracranial hemorrhage, mass, midline shift, or extra-axial fluid collection. Mild cerebral atrophy is felt to be within normal limits for age. No significant white matter disease is evident. Vascular: Major intracranial vascular flow voids are preserved. Skull  and upper cervical spine: Unremarkable bone marrow signal. Sinuses/Orbits: Unremarkable orbits. Mild scattered mucosal thickening in the paranasal sinuses. Clear mastoid air cells. Other: None. IMPRESSION: Unremarkable appearance of the brain for age on this motion degraded study. Electronically Signed   By: Sebastian Ache M.D.   On: 03/29/2020 19:28   ECHOCARDIOGRAM COMPLETE  Result Date: 03/29/2020    ECHOCARDIOGRAM REPORT   Patient Name:   TAI SKELLY Date of Exam: 03/29/2020 Medical Rec #:  [id number]     Height:       74.4 in Accession #:    [id number]    Weight:       267.0 lb Date of Birth:  1960-12-23     BSA:          2.467 m Patient Age:    59 years      BP:           161/88 mmHg Patient Gender: M             HR:           105 bpm. Exam Location:  Inpatient Procedure: 2D Echo Indications:    syncope  History:        Patient has no prior history of Echocardiogram examinations.                 Risk Factors:Current Smoker.  Sonographer:    Delcie Roch Referring Phys: [phone] WHITNEY F DAVIS IMPRESSIONS  1. Left ventricular ejection fraction, by estimation, is 65 to 70%. The left ventricle has hyperdynamic function. The left ventricle has no regional wall motion abnormalities. There is mild left ventricular hypertrophy. Left ventricular diastolic parameters are indeterminate.  2. Right ventricular systolic function is normal. The right ventricular size is normal. Tricuspid regurgitation signal is inadequate for assessing PA pressure.  3. The mitral valve is normal in structure. No evidence of mitral valve regurgitation. No evidence of mitral stenosis.  4. The aortic valve is grossly normal. Aortic valve regurgitation is not visualized. No aortic stenosis is present.  5. Aortic dilatation noted. There is mild dilatation of the aortic root, measuring 42 mm.  6. The inferior vena cava is normal in size with greater than 50% respiratory variability, suggesting right atrial pressure of 3 mmHg. FINDINGS  Left  Ventricle: Left ventricular ejection fraction, by estimation, is 65 to 70%. The left ventricle has hyperdynamic function. The left ventricle has no regional wall motion abnormalities. The left ventricular internal cavity size Dunn normal in size. There is mild left ventricular hypertrophy. Left ventricular diastolic parameters are indeterminate. Right Ventricle: The right ventricular size is normal. No increase in right ventricular  wall thickness. Right ventricular systolic function is normal. Tricuspid regurgitation signal is inadequate for assessing PA pressure. Left Atrium: Left atrial size Dunn normal in size. Right Atrium: Right atrial size Dunn normal in size. Pericardium: Trivial pericardial effusion is present. Mitral Valve: The mitral valve is normal in structure. No evidence of mitral valve regurgitation. No evidence of mitral valve stenosis. Tricuspid Valve: The tricuspid valve is normal in structure. Tricuspid valve regurgitation is not demonstrated. No evidence of tricuspid stenosis. Aortic Valve: The aortic valve is grossly normal. Aortic valve regurgitation is not visualized. No aortic stenosis is present. Pulmonic Valve: The pulmonic valve Dunn grossly normal. Pulmonic valve regurgitation is trivial. No evidence of pulmonic stenosis. Aorta: Aortic dilatation noted and the ascending aorta Dunn not well visualized. There is mild dilatation of the aortic root, measuring 42 mm. Venous: The inferior vena cava is normal in size with greater than 50% respiratory variability, suggesting right atrial pressure of 3 mmHg. IAS/Shunts: The interatrial septum appears to be lipomatous. No atrial level shunt detected by color flow Doppler.  LEFT VENTRICLE PLAX 2D LVIDd:         4.40 cm LVIDs:         3.10 cm LV PW:         1.10 cm LV IVS:        1.40 cm LVOT diam:     2.40 cm LV SV:         86 LV SV Index:   35 LVOT Area:     4.52 cm  RIGHT VENTRICLE             IVC RV S prime:     22.60 cm/s  IVC diam: 1.10 cm TAPSE  (M-mode): 2.6 cm LEFT ATRIUM             Index       RIGHT ATRIUM           Index LA diam:        3.70 cm 1.50 cm/m  RA Area:     13.60 cm LA Vol (A2C):   59.5 ml 24.12 ml/m RA Volume:   31.20 ml  12.65 ml/m LA Vol (A4C):   29.8 ml 12.08 ml/m LA Biplane Vol: 42.4 ml 17.19 ml/m  AORTIC VALVE LVOT Vmax:   110.00 cm/s LVOT Vmean:  74.800 cm/s LVOT VTI:    0.190 m  AORTA Ao Root diam: 4.20 cm Ao Asc diam:  3.20 cm  SHUNTS Systemic VTI:  0.19 m Systemic Diam: 2.40 cm Weston BrassGayatri Acharya MD Electronically signed by Weston BrassGayatri Acharya MD Signature Date/Time: 03/29/2020/2:15:22 PM    Final      Signed: Chari ManningBenjamin N. Roni Scow, D.O.  Internal Medicine Resident, PGY-2 [facility]  Pager: [phone] 5:00 PM, 03/30/2020

## 2020-03-31 ENCOUNTER — Other Ambulatory Visit: Payer: Self-pay | Admitting: Student

## 2020-03-31 MED ORDER — DULERA 100-5 MCG/ACT IN AERO: 2.0000 | INHALATION_SPRAY | Freq: Two times a day (BID) | RESPIRATORY_TRACT | 0 refills | Status: DC

## 2020-03-31 MED ORDER — INCRUSE ELLIPTA 62.5 MCG/INH IN AEPB: 1.0000 | INHALATION_SPRAY | Freq: Once | RESPIRATORY_TRACT | 0 refills | Status: AC

## 2020-04-09 ENCOUNTER — Other Ambulatory Visit: Payer: Self-pay | Admitting: Student

## 2020-04-09 DIAGNOSIS — R062 Wheezing: Secondary | ICD-10-CM

## 2020-04-09 MED ORDER — TRELEGY ELLIPTA 100-62.5-25 MCG/INH IN AEPB: 1.0000 | INHALATION_SPRAY | Freq: Every day | RESPIRATORY_TRACT | 2 refills | Status: DC

## 2020-04-09 NOTE — Progress Notes (Signed)
Dulera and Incruse Ellipta have been rejected and required PA. Trelegy Ellipta is a good alternative that does not require PA.

## 2020-07-23 ENCOUNTER — Other Ambulatory Visit: Payer: Self-pay | Admitting: Student

## 2020-07-23 DIAGNOSIS — R062 Wheezing: Secondary | ICD-10-CM

## 2021-05-25 NOTE — Patient Instructions (Addendum)
Steps to Quit Smoking Smoking tobacco is the leading cause of preventable death. It can affect almost every organ in the body. Smoking puts you and people around you at risk for many serious, long-lasting (chronic) diseases. Quitting smoking can be hard, but it is one of the best things that you can do for your health. It is never too late to quit. Do not give up if you cannot quit the first time. Some people need to try many times to quit. Do your best to stick to your quit plan, and talk with your doctor if you have any questions or concerns. How do I get ready to quit? Pick a date to quit. Set a date within the next 2 weeks to give you time to prepare. Write down the reasons why you are quitting. Keep this list in places where you will see it often. Tell your family, friends, and co-workers that you are quitting. Their support is important. Talk with your doctor about the choices that may help you quit. Find out if your health insurance will pay for these treatments. Know the people, places, things, and activities that make you want to smoke (triggers). Avoid them. What first steps can I take to quit smoking? Throw away all cigarettes at home, at work, and in your car. Throw away the things that you use when you smoke, such as ashtrays and lighters. Clean your car. Empty the ashtray. Clean your home, including curtains and carpets. What can I do to help me quit smoking? Talk with your doctor about taking medicines and seeing a counselor. You are more likely to succeed when you do both. If you are pregnant or breastfeeding: Talk with your doctor about counseling or other ways to quit smoking. Do not take medicine to help you quit smoking unless your doctor tells you to. Quit right away Quit smoking completely, instead of slowly cutting back on how much you smoke over a period of time. Stopping smoking right away may be more successful than slowly quitting. Go to counseling. In-person is best  if this is an option. You are more likely to quit if you go to counseling sessions regularly. Take medicine You may take medicines to help you quit. Some medicines need a prescription, and some you can buy over-the-counter. Some medicines may contain a drug called nicotine to replace the nicotine in cigarettes. Medicines may: Help you stop having the desire to smoke (cravings). Help to stop the problems that come when you stop smoking (withdrawal symptoms). Your doctor may ask you to use: Nicotine patches, gum, or lozenges. Nicotine inhalers or sprays. Non-nicotine medicine that you take by mouth. Find resources Find resources and other ways to help you quit smoking and remain smoke-free after you quit. They include: Online chats with a counselor. Phone quitlines. Printed self-help materials. Support groups or group counseling. Text messaging programs. Mobile phone apps. Use apps on your mobile phone or tablet that can help you stick to your quit plan. Examples of free services include Quit Guide from the CDC and smokefree.gov  What can I do to make it easier to quit?  Talk to your family and friends. Ask them to support and encourage you. Call a phone quitline, such as 1-800-QUIT-NOW, reach out to support groups, or work with a counselor. Ask people who smoke to not smoke around you. Avoid places that make you want to smoke, such as: Bars. Parties. Smoke-break areas at work. Spend time with people who do not smoke. Lower   the stress in your life. Stress can make you want to smoke. Try these things to lower stress: Getting regular exercise. Doing deep-breathing exercises. Doing yoga. Meditating. What benefits will I see if I quit smoking? Over time, you may have: A better sense of smell and taste. Less coughing and sore throat. A slower heart rate. Lower blood pressure. Clearer skin. Better breathing. Fewer sick days. Summary Quitting smoking can be hard, but it is one of  the best things that you can do for your health. Do not give up if you cannot quit the first time. Some people need to try many times to quit. When you decide to quit smoking, make a plan to help you succeed. Quit smoking right away, not slowly over a period of time. When you start quitting, get help and support to keep you smoke-free. This information is not intended to replace advice given to you by your health care provider. Make sure you discuss any questions you have with your health care provider. Document Revised: 12/25/2020 Document Reviewed: 12/25/2020 Elsevier Patient Education  2023 Elsevier Inc.  

## 2021-05-27 ENCOUNTER — Telehealth: Payer: Self-pay | Admitting: *Deleted

## 2021-05-27 NOTE — Telephone Encounter (Signed)
? ?  Pre-operative Risk Assessment  ?  ?Patient Name: Ronnie Dunn  ?DOB: 1960/12/23 ?MRN: [id number]  ? ? PT HAS NEW PT APPT WITH DR. [facility] 06/01/21. ? ?Request for Surgical Clearance   ? ?Procedure:   COLONOSCOPY/ENDOSCOPY (+ HEME STOOL AND SCREENING AND GERD) ? ?Date of Surgery:  Clearance TBD                              ?  ?Surgeon:  PERFORMING PHYSICIAN NOT LISTED  ?Surgeon's Group or Practice Name:  [facility] ?Phone number:  [phone] ?Fax number:  [phone] ?  ?Type of Clearance Requested:   ?- Medical  ?- Pharmacy:  Hold Rivaroxaban (Xarelto)   ?  ?Type of Anesthesia:   PROPOFOL ?  ?Additional requests/questions:   ? ?Signed, ?Danielle Rankin   ?05/27/2021, 5:54 PM  ? ?

## 2021-05-28 ENCOUNTER — Ambulatory Visit (INDEPENDENT_AMBULATORY_CARE_PROVIDER_SITE_OTHER): Payer: 59 | Admitting: Physician Assistant

## 2021-05-28 ENCOUNTER — Encounter: Payer: Self-pay | Admitting: Physician Assistant

## 2021-05-28 VITALS — BP 157/80 | HR 80 | Temp 97.7°F | Ht 74.0 in | Wt 268.0 lb

## 2021-05-28 DIAGNOSIS — E119 Type 2 diabetes mellitus without complications: Secondary | ICD-10-CM

## 2021-05-28 DIAGNOSIS — I4821 Permanent atrial fibrillation: Secondary | ICD-10-CM | POA: Insufficient documentation

## 2021-05-28 DIAGNOSIS — K219 Gastro-esophageal reflux disease without esophagitis: Secondary | ICD-10-CM

## 2021-05-28 DIAGNOSIS — I10 Essential (primary) hypertension: Secondary | ICD-10-CM | POA: Diagnosis not present

## 2021-05-28 DIAGNOSIS — L0291 Cutaneous abscess, unspecified: Secondary | ICD-10-CM

## 2021-05-28 DIAGNOSIS — R569 Unspecified convulsions: Secondary | ICD-10-CM

## 2021-05-28 DIAGNOSIS — R600 Localized edema: Secondary | ICD-10-CM | POA: Insufficient documentation

## 2021-05-28 DIAGNOSIS — E1122 Type 2 diabetes mellitus with diabetic chronic kidney disease: Secondary | ICD-10-CM | POA: Insufficient documentation

## 2021-05-28 DIAGNOSIS — Z7689 Persons encountering health services in other specified circumstances: Secondary | ICD-10-CM

## 2021-05-28 DIAGNOSIS — J449 Chronic obstructive pulmonary disease, unspecified: Secondary | ICD-10-CM

## 2021-05-28 DIAGNOSIS — I4891 Unspecified atrial fibrillation: Secondary | ICD-10-CM | POA: Diagnosis not present

## 2021-05-28 HISTORY — DX: Type 2 diabetes mellitus without complications: E11.9

## 2021-05-28 HISTORY — DX: Gastro-esophageal reflux disease without esophagitis: K21.9

## 2021-05-28 HISTORY — DX: Permanent atrial fibrillation: I48.21

## 2021-05-28 HISTORY — DX: Chronic obstructive pulmonary disease, unspecified: J44.9

## 2021-05-28 MED ORDER — DOXYCYCLINE HYCLATE 100 MG PO TABS: 100.0000 mg | ORAL_TABLET | Freq: Two times a day (BID) | ORAL | 0 refills | Status: AC

## 2021-05-28 MED ORDER — DOXAZOSIN MESYLATE 4 MG PO TABS: 4.0000 mg | ORAL_TABLET | Freq: Every day | ORAL | 1 refills | Status: DC

## 2021-05-28 NOTE — Assessment & Plan Note (Signed)
-  Patient is on a high dose of diuretic therapy and will consider treatment adjustments in the future. Will continue Lasix 40 mg- 2 tablets in the morning and 1 tablet in the afternoon/evening. Will review PCP records once received.  ?

## 2021-05-28 NOTE — Assessment & Plan Note (Signed)
-  Irregular rhythm on exam. Patient denies chest pain, palpitations, syncope, fatigue or dizziness. Recommend to continue beta blocker and anticoagulant. Follow-up with cardiology as scheduled on 06/01/2021. Will request PCP records. ?

## 2021-05-28 NOTE — Assessment & Plan Note (Signed)
-  Will request PCP records. ?-Continue Trelegy 100-62.5-25 mg daily and Albuterol prn. ?-Recommend to continue wtih smoking reduction.  ?-Will continue to monitor. ?

## 2021-05-28 NOTE — Assessment & Plan Note (Signed)
-  Will request PCP records. ?-BP elevated in office today. Will repeat and reassess at f/up visit next week. ?-Continue Cardura 4 mg at bedtime and metoprolol succinate 150 mg daily. ?-Will continue to monitor. ?

## 2021-05-28 NOTE — Assessment & Plan Note (Addendum)
-  Reviewed hospital notes from hospitalization 03/28/2020. Seizure was attributed to hyponatremia in setting of acute GI illness and alcohol use. No reoccurrence. Patient is on diuretic therapy so recommend close monitoring of electrolytes. ?

## 2021-05-28 NOTE — Assessment & Plan Note (Signed)
-  Will request PCP records. ?-Continue famotidine 40 mg daily. If symptoms remain stable/controlled then will consider reducing medication dose.  ?-Will continue to monitor. ?

## 2021-05-28 NOTE — Assessment & Plan Note (Signed)
-  Will request PCP records. ?-Continue Farxiga 5 mg. ?-Will continue to monitor. ?
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
## 2021-05-30 NOTE — Progress Notes (Signed)
?Cardiology Office Note:   ? ?Date:  06/01/2021  ? ?ID:  Ronnie Dunn, DOB March 18, 1960, MRN [id number] ? ?PCP:  Mayer Masker, PA-C ?  ?[facility] Providers ?Cardiologist:  None    ? ?Referring MD: [name], MD  ? ?CC: pre-op eval ?Consulted for the evaluation of procedural risk discussion at the behest of Mayer Masker, PA-C ? ? ?Procedure:   COLONOSCOPY/ENDOSCOPY (+ HEME STOOL AND SCREENING AND GERD) ?Surgeon's Group or Practice Name:  [facility] ?Phone number:  [phone] ?Fax number:  [phone] ? Question of AC hold ? Type of Anesthesia:   PROPOFOL ? ? ?History of Present Illness:   ? ?Ronnie Dunn is a 61 y.o. male with a hx of PAF, COPD, still actively smoking, blood in stool, HTN with DM, alcohol use, who presents for evaluation 06/01/21. ? ?Patient notes that he is feeling fine .  Has had no chest pain, chest pressure, chest tightness, chest stinging .   ? ?He broke his right ankle.  He has been dealing with seizures for knee and ankle. ?Since his ankle was broken two years ago, has had leg swelling that is significant.  Improved with diuretics ? ?Has a long history of passing out and near passing out.  This first started after he started mixing chemicals together.  Found to have low sodium and seizures. He drinks Gatorade Zero X3 and this will stop the aura of his events. ? ?Cannot feel atrial fibrillation. ? ?Has OSA but has not had mask.  ? ?He has not noticed blood in his stool. ? ?No past medical history on file. ? ?No past surgical history on file. ? ?Current Medications: ?Current Meds  ?Medication Sig  ? albuterol (VENTOLIN HFA) 108 (90 Base) MCG/ACT inhaler Inhale 2 puffs into the lungs every 4 (four) hours as needed.  ? atorvastatin (LIPITOR) 10 MG tablet Take 10 mg by mouth daily.  ? benzonatate (TESSALON) 200 MG capsule Take 200 mg by mouth 3 (three) times daily as needed for cough.  ? cetirizine (ZYRTEC) 10 MG tablet Take 10 mg by mouth daily.  ? dapagliflozin propanediol  (FARXIGA) 10 MG TABS tablet Take 1 tablet (10 mg total) by mouth daily before breakfast.  ? doxazosin (CARDURA) 4 MG tablet Take 1 tablet (4 mg total) by mouth at bedtime.  ? doxycycline (VIBRA-TABS) 100 MG tablet Take 1 tablet (100 mg total) by mouth 2 (two) times daily for 7 days.  ? famotidine (PEPCID) 40 MG tablet Take 40 mg by mouth daily.  ? Fluticasone-Umeclidin-Vilant (TRELEGY ELLIPTA) 100-62.5-25 MCG/INH AEPB Inhale 1 puff into the lungs daily.  ? furosemide (LASIX) 40 MG tablet Take 120 mg by mouth every morning.  ? magnesium oxide (MAG-OX) 400 MG tablet Take 400 mg by mouth daily.  ? metolazone (ZAROXOLYN) 2.5 MG tablet Take 1 tablet (2.5 mg total) by mouth once a week.  ? metoprolol succinate (TOPROL-XL) 100 MG 24 hr tablet Take 150 mg by mouth daily.  ? naproxen (NAPROSYN) 500 MG tablet Take 500 mg by mouth daily as needed for pain.  ? traZODone (DESYREL) 50 MG tablet Take 50 mg by mouth daily as needed for sleep (anxiety).  ? [DISCONTINUED] aspirin EC 81 MG tablet Take 81 mg by mouth daily. Swallow whole.  ? [DISCONTINUED] FARXIGA 5 MG TABS tablet Take 5 mg by mouth daily.  ? [DISCONTINUED] rivaroxaban (XARELTO) 20 MG TABS tablet Take 20 mg by mouth daily with supper.  ?  ? ?Allergies:  Patient has no known allergies.  ? ?Social History  ? ?Socioeconomic History  ? Marital status: Married  ?  Spouse name: Ronnie Dunn  ? Number of children: 2  ? Years of education: Highschool Graduate  ? Highest education level: Not on file  ?Occupational History  ? Not on file  ?Tobacco Use  ? Smoking status: Every Day  ?  Packs/day: 0.50  ?  Types: Cigarettes  ?  Start date: 63  ? Smokeless tobacco: Never  ?Vaping Use  ? Vaping Use: Never used  ?Substance and Sexual Activity  ? Alcohol use: Yes  ?  Alcohol/week: 20.0 standard drinks  ?  Types: 20 Cans of beer per week  ? Drug use: Not Currently  ? Sexual activity: Yes  ?  Partners: Female  ?  Birth control/protection: None  ?Other Topics Concern  ? Not on file  ?Social  History Narrative  ? ** Merged History Encounter **  ?    ? ?Social Determinants of Health  ? ?Financial Resource Strain: Not on file  ?Food Insecurity: Not on file  ?Transportation Needs: Not on file  ?Physical Activity: Not on file  ?Stress: Not on file  ?Social Connections: Not on file  ?  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 ?Family History: ?Brother has atrial fibrillation. ?Mother has pacemaker ? ?ROS:   ?Please see the history of present illness.    ? All other systems reviewed and are negative. ? ?EKGs/Labs/Other Studies Reviewed:   ? ?The following studies were reviewed today: ? ?EKG:  EKG is  ordered today.  The ekg ordered today demonstrates  ?06/01/21:  AF rates 72  ? ?Recent Labs: ?No results found for requested labs within last 8760 hours.  ?Recent Lipid Panel ?No results found for: CHOL, TRIG, HDL, CHOLHDL, VLDL, LDLCALC, LDLDIRECT ? ? ?Risk Assessment/Calculations:   ? ?CHA2DS2-VASc Score = 2  ? This indicates a 2.2% annual risk of stroke. ?The patient's score is based upon: ?CHF History: 0 ?HTN History: 1 ?Diabetes History: 1 ?Stroke History: 0 ?Vascular Disease History: 0 ?Age Score: 0 ?Gender Score: 0 ?  ? ? ?    ? ?Physical Exam:   ? ?VS:  BP 130/88   Pulse 71   Ht 6\' 2"  (1.88 m)   Wt 268 lb (121.6 kg)   SpO2 95%   BMI 34.41 kg/m?    ? ?Wt Readings from Last 3 Encounters:  ?06/01/21 268 lb (121.6 kg)  ?05/28/21 268 lb (121.6 kg)  ?03/28/20 267 lb (121.1 kg)  ?  ? ?Gen: No distress, obese male   ?Neck: No JVD,  ?Cardiac: No Rubs or Gallops, IRIR +2 radial pulses ?Respiratory: bilateral wheezes, normal effort, normal  respiratory rate ?GI: Soft, nontender, distended with slight fluid wave ?MS: +1 edema;  moves all extremities ?Integument: Skin feels warm ?Neuro:  At time of evaluation, alert and oriented to person/place/time/situation  ?Psych: Normal affect, patient feels fine ? ? ?ASSESSMENT:   ? ?1. Persistent atrial fibrillation ([place])   ?2. Thoracic aortic  aneurysm without rupture, unspecified part ([place])   ?3. Chronic obstructive pulmonary disease, unspecified COPD type ([place])   ?4. Essential hypertension   ?5. Type 2 diabetes mellitus without complication, without long-term current use of insulin ([place])   ?6. Lower extremity edema   ?7. Alcohol use disorder, moderate, dependence ([place])   ?8. OSA (obstructive sleep apnea)   ?9. Chronic heart failure with preserved ejection fraction ([place])   ? ?  PLAN:   ? ?Preoperative Risk Assessment ?- Patient is at low  cardiac risk and in the setting of HFpEF but may be at higher medical risk due to his:  Uncontrolled COPD, significant smoking significant alcohol use ?- given concern for GI bleed, it may be reasonable to proceed to despite his significant comorbidity and low risk procedure planned ?- 4 functional mets, no ischemic testing indicated prior to surgery ?- can hold DOAC as needed ?- will stop ASA ? ?Persistent Atrial Fibrillation ?- Risk factors include HTN, DM, Alcohol Use, COPD, known OSA ?- CHADSVASC=2. ?- discussed alcohol as a trigger; we have discussed alcohol cessation ?- will get sleep study at next visit ?- Will get obtain TTE.   ?- continue metoprolol 150 mg PO daily for now ?- stop ASA 81 mg PO daily ?- once his GI bleed issues are resolved, he would benefit from rhythm control evaluation ? ? Heart Failure Preserved Ejection Fraction  ?- NYHA class I per sx report, Stage C, hypervolemic, etiology unclear ?- Diuretic regimen: Lasix 120 mg PO daily; I have added metolazone 2.5 mg PO weekly ?- Discussed the importance of fluid restriction of < 2 L, salt restriction, and checking daily weights  ?- BMP/BNP/Mg in one week ?- increasing Farxiga to 10 mg PO daily ? ?Tobacco abuse ?We have discussed tobacco cessation; he is precontemplative ? ?TAA ?- mild getting follow up echo ? ?   ? ?3-4 months me or APP  ? ? ?Medication Adjustments/Labs and Tests Ordered: ?Current medicines are reviewed at length with the patient  today.  Concerns regarding medicines are outlined above.  ?Orders Placed This Encounter  ?Procedures  ? Basic metabolic panel  ? Pro b natriuretic peptide (BNP)  ? Magnesium  ? EKG 12-Lead  ? ECHOCARDIOGRAM COMPLETE  ? ?M

## 2021-06-01 ENCOUNTER — Encounter: Payer: Self-pay | Admitting: Internal Medicine

## 2021-06-01 ENCOUNTER — Ambulatory Visit (INDEPENDENT_AMBULATORY_CARE_PROVIDER_SITE_OTHER): Payer: 59 | Admitting: Internal Medicine

## 2021-06-01 VITALS — BP 130/88 | HR 71 | Ht 74.0 in | Wt 268.0 lb

## 2021-06-01 DIAGNOSIS — E119 Type 2 diabetes mellitus without complications: Secondary | ICD-10-CM

## 2021-06-01 DIAGNOSIS — J449 Chronic obstructive pulmonary disease, unspecified: Secondary | ICD-10-CM | POA: Diagnosis not present

## 2021-06-01 DIAGNOSIS — I4819 Other persistent atrial fibrillation: Secondary | ICD-10-CM

## 2021-06-01 DIAGNOSIS — I5032 Chronic diastolic (congestive) heart failure: Secondary | ICD-10-CM

## 2021-06-01 DIAGNOSIS — I712 Thoracic aortic aneurysm, without rupture, unspecified: Secondary | ICD-10-CM

## 2021-06-01 DIAGNOSIS — R600 Localized edema: Secondary | ICD-10-CM

## 2021-06-01 DIAGNOSIS — I10 Essential (primary) hypertension: Secondary | ICD-10-CM | POA: Diagnosis not present

## 2021-06-01 DIAGNOSIS — G4733 Obstructive sleep apnea (adult) (pediatric): Secondary | ICD-10-CM

## 2021-06-01 DIAGNOSIS — F1020 Alcohol dependence, uncomplicated: Secondary | ICD-10-CM

## 2021-06-01 HISTORY — DX: Obstructive sleep apnea (adult) (pediatric): G47.33

## 2021-06-01 HISTORY — DX: Thoracic aortic aneurysm, without rupture, unspecified: I71.20

## 2021-06-01 HISTORY — DX: Chronic diastolic (congestive) heart failure: I50.32

## 2021-06-01 MED ORDER — METOLAZONE 2.5 MG PO TABS: 2.5000 mg | ORAL_TABLET | ORAL | 3 refills | Status: DC

## 2021-06-01 MED ORDER — DAPAGLIFLOZIN PROPANEDIOL 10 MG PO TABS: 10.0000 mg | ORAL_TABLET | Freq: Every day | ORAL | 3 refills | Status: DC

## 2021-06-01 MED ORDER — RIVAROXABAN 20 MG PO TABS
20.0000 mg | ORAL_TABLET | Freq: Every day | ORAL | 3 refills | Status: DC
Start: 2021-06-01 — End: 2022-06-27

## 2021-06-01 NOTE — Patient Instructions (Signed)
Medication Instructions:  ?Your physician has recommended you make the following change in your medication:  ?STOP: Aspirin ?CONTINUE: Xarelto 20 mg by mouth daily with supper ? ?INCREASE: dapagliflozin propanediiol (Farxiga) to 10 mg by mouth daily before breakfast ? ?START: metolazone (Zaroxolyn) 2.5 mg by mouth once weekly ?*If you need a refill on your cardiac medications before your next appointment, please call your pharmacy* ? ? ?Lab Work: ?IN 1 WEEK: BMP, BNP, Mg ?If you have labs (blood work) drawn today and your tests are completely normal, you will receive your results only by: ?MyChart Message (if you have MyChart) OR ?A paper copy in the mail ?If you have any lab test that is abnormal or we need to change your treatment, we will call you to review the results. ? ? ?Testing/Procedures: ?Your physician has requested that you have an echocardiogram. Echocardiography is a painless test that uses sound waves to create images of your heart. It provides your doctor with information about the size and shape of your heart and how well your heart?s chambers and valves are working. This procedure takes approximately one hour. There are no restrictions for this procedure. ? ? ? ?Follow-Up: ?At [facility], you and your health needs are our priority.  As part of our continuing mission to provide you with exceptional heart care, we have created designated Provider Care Teams.  These Care Teams include your primary Cardiologist (physician) and Advanced Practice Providers (APPs -  Physician Assistants and Nurse Practitioners) who all work together to provide you with the care you need, when you need it. ? ?We recommend signing up for the patient portal called "MyChart".  Sign up information is provided on this After Visit Summary.  MyChart is used to connect with patients for Virtual Visits (Telemedicine).  Patients are able to view lab/test results, encounter notes, upcoming appointments, etc.  Non-urgent messages  can be sent to your provider as well.   ?To learn more about what you can do with MyChart, go to [email or web address].   ? ?Your next appointment:   ?2 month(s) ? ?The format for your next appointment:   ?In Person ? ?Provider:   ?Riley Lam, MD or Ronie Spies, PA-C, Jacolyn Reedy, PA-C, or Eligha Bridegroom, NP      ?  ? ? ? ?Important Information About Sugar ? ? ? ? ?  ?

## 2021-06-03 ENCOUNTER — Encounter: Payer: Self-pay | Admitting: Family Medicine

## 2021-06-04 ENCOUNTER — Ambulatory Visit: Payer: 59 | Admitting: Physician Assistant

## 2021-06-09 ENCOUNTER — Other Ambulatory Visit: Payer: 59

## 2021-06-10 ENCOUNTER — Other Ambulatory Visit: Payer: 59

## 2021-06-10 DIAGNOSIS — I4819 Other persistent atrial fibrillation: Secondary | ICD-10-CM

## 2021-06-11 LAB — BASIC METABOLIC PANEL
BUN/Creatinine Ratio: 24 (ref 10–24)
BUN: 24 mg/dL (ref 8–27)
CO2: 27 mmol/L (ref 20–29)
Calcium: 9.1 mg/dL (ref 8.6–10.2)
Chloride: 88 mmol/L — ABNORMAL LOW (ref 96–106)
Creatinine, Ser: 1.02 mg/dL (ref 0.76–1.27)
Glucose: 135 mg/dL — ABNORMAL HIGH (ref 70–99)
Potassium: 4.8 mmol/L (ref 3.5–5.2)
Sodium: 129 mmol/L — ABNORMAL LOW (ref 134–144)
eGFR: 84 mL/min/{1.73_m2} (ref >59)

## 2021-06-11 LAB — PRO B NATRIURETIC PEPTIDE: NT-Pro BNP: 1316 pg/mL — ABNORMAL HIGH (ref 0–210)

## 2021-06-11 LAB — MAGNESIUM: Magnesium: 2.1 mg/dL (ref 1.6–2.3)

## 2021-06-16 ENCOUNTER — Telehealth: Payer: Self-pay

## 2021-06-16 DIAGNOSIS — I10 Essential (primary) hypertension: Secondary | ICD-10-CM

## 2021-06-16 DIAGNOSIS — I5032 Chronic diastolic (congestive) heart failure: Secondary | ICD-10-CM

## 2021-06-16 MED ORDER — SPIRONOLACTONE 25 MG PO TABS: 12.5000 mg | ORAL_TABLET | Freq: Every day | ORAL | 3 refills | Status: DC

## 2021-06-16 MED ORDER — METOLAZONE 2.5 MG PO TABS: 2.5000 mg | ORAL_TABLET | ORAL | 3 refills | Status: DC

## 2021-06-16 NOTE — Telephone Encounter (Signed)
The patient has been notified of the result and verbalized understanding.  All questions (if any) were answered. Macie Burows, RN 06/16/2021 10:31 AM  F/u labs scheduled for 06/23/21.

## 2021-06-16 NOTE — Telephone Encounter (Signed)
-----   Message from Werner Lean, MD sent at 06/14/2021  3:37 PM EDT ----- Results: BG improved, Sodium improved, K 4.8, Calcium improved BNP elevated Plan: Would start metolazone twice a week Would start aldactone 12.5 mg PO Daily BNP and BMP at time of Echo 06/23/21  Werner Lean, MD

## 2021-06-21 ENCOUNTER — Emergency Department (HOSPITAL_COMMUNITY): Payer: 59

## 2021-06-21 ENCOUNTER — Encounter (HOSPITAL_COMMUNITY): Payer: Self-pay

## 2021-06-21 ENCOUNTER — Telehealth: Payer: Self-pay | Admitting: Internal Medicine

## 2021-06-21 ENCOUNTER — Other Ambulatory Visit: Payer: Self-pay

## 2021-06-21 ENCOUNTER — Inpatient Hospital Stay (HOSPITAL_COMMUNITY)
Admission: EM | Admit: 2021-06-21 | Discharge: 2021-06-25 | DRG: 194 | Disposition: A | Discharge status: Home / Self Care | Payer: 59 | Attending: Internal Medicine | Admitting: Internal Medicine

## 2021-06-21 DIAGNOSIS — Z79899 Other long term (current) drug therapy: Secondary | ICD-10-CM

## 2021-06-21 DIAGNOSIS — R4020 Unspecified coma: Secondary | ICD-10-CM | POA: Diagnosis present

## 2021-06-21 DIAGNOSIS — R55 Syncope and collapse: Secondary | ICD-10-CM

## 2021-06-21 DIAGNOSIS — E1165 Type 2 diabetes mellitus with hyperglycemia: Secondary | ICD-10-CM | POA: Diagnosis not present

## 2021-06-21 DIAGNOSIS — E871 Hypo-osmolality and hyponatremia: Secondary | ICD-10-CM | POA: Diagnosis not present

## 2021-06-21 DIAGNOSIS — R823 Hemoglobinuria: Secondary | ICD-10-CM | POA: Diagnosis present

## 2021-06-21 DIAGNOSIS — I2510 Atherosclerotic heart disease of native coronary artery without angina pectoris: Secondary | ICD-10-CM | POA: Diagnosis present

## 2021-06-21 DIAGNOSIS — E119 Type 2 diabetes mellitus without complications: Secondary | ICD-10-CM

## 2021-06-21 DIAGNOSIS — Z8249 Family history of ischemic heart disease and other diseases of the circulatory system: Secondary | ICD-10-CM

## 2021-06-21 DIAGNOSIS — I5032 Chronic diastolic (congestive) heart failure: Secondary | ICD-10-CM | POA: Diagnosis present

## 2021-06-21 DIAGNOSIS — J189 Pneumonia, unspecified organism: Principal | ICD-10-CM

## 2021-06-21 DIAGNOSIS — J439 Emphysema, unspecified: Secondary | ICD-10-CM | POA: Diagnosis present

## 2021-06-21 DIAGNOSIS — R0902 Hypoxemia: Secondary | ICD-10-CM

## 2021-06-21 DIAGNOSIS — I10 Essential (primary) hypertension: Secondary | ICD-10-CM | POA: Diagnosis present

## 2021-06-21 DIAGNOSIS — J181 Lobar pneumonia, unspecified organism: Principal | ICD-10-CM | POA: Diagnosis present

## 2021-06-21 DIAGNOSIS — J449 Chronic obstructive pulmonary disease, unspecified: Secondary | ICD-10-CM | POA: Diagnosis present

## 2021-06-21 DIAGNOSIS — I712 Thoracic aortic aneurysm, without rupture, unspecified: Secondary | ICD-10-CM | POA: Diagnosis present

## 2021-06-21 DIAGNOSIS — E669 Obesity, unspecified: Secondary | ICD-10-CM | POA: Diagnosis present

## 2021-06-21 DIAGNOSIS — F17210 Nicotine dependence, cigarettes, uncomplicated: Secondary | ICD-10-CM | POA: Diagnosis present

## 2021-06-21 DIAGNOSIS — E1122 Type 2 diabetes mellitus with diabetic chronic kidney disease: Secondary | ICD-10-CM

## 2021-06-21 DIAGNOSIS — I110 Hypertensive heart disease with heart failure: Secondary | ICD-10-CM | POA: Diagnosis present

## 2021-06-21 DIAGNOSIS — Z7901 Long term (current) use of anticoagulants: Secondary | ICD-10-CM

## 2021-06-21 DIAGNOSIS — I4821 Permanent atrial fibrillation: Secondary | ICD-10-CM | POA: Diagnosis present

## 2021-06-21 DIAGNOSIS — Z6835 Body mass index (BMI) 35.0-35.9, adult: Secondary | ICD-10-CM

## 2021-06-21 DIAGNOSIS — Y902 Blood alcohol level of 40-59 mg/100 ml: Secondary | ICD-10-CM | POA: Diagnosis present

## 2021-06-21 DIAGNOSIS — R569 Unspecified convulsions: Secondary | ICD-10-CM

## 2021-06-21 DIAGNOSIS — Z20822 Contact with and (suspected) exposure to covid-19: Secondary | ICD-10-CM | POA: Diagnosis present

## 2021-06-21 DIAGNOSIS — Z91148 Patient's other noncompliance with medication regimen for other reason: Secondary | ICD-10-CM

## 2021-06-21 DIAGNOSIS — F1010 Alcohol abuse, uncomplicated: Secondary | ICD-10-CM | POA: Diagnosis present

## 2021-06-21 DIAGNOSIS — G4733 Obstructive sleep apnea (adult) (pediatric): Secondary | ICD-10-CM | POA: Diagnosis present

## 2021-06-21 DIAGNOSIS — K219 Gastro-esophageal reflux disease without esophagitis: Secondary | ICD-10-CM | POA: Diagnosis present

## 2021-06-21 DIAGNOSIS — I700 Atherosclerosis of aorta: Secondary | ICD-10-CM | POA: Diagnosis present

## 2021-06-21 DIAGNOSIS — E43 Unspecified severe protein-calorie malnutrition: Secondary | ICD-10-CM | POA: Diagnosis present

## 2021-06-21 DIAGNOSIS — R809 Proteinuria, unspecified: Secondary | ICD-10-CM | POA: Diagnosis present

## 2021-06-21 DIAGNOSIS — E440 Moderate protein-calorie malnutrition: Secondary | ICD-10-CM | POA: Diagnosis present

## 2021-06-21 LAB — COMPREHENSIVE METABOLIC PANEL
ALT: 9 U/L (ref 0–44)
AST: 19 U/L (ref 15–41)
Albumin: 3.3 g/dL — ABNORMAL LOW (ref 3.5–5.0)
Alkaline Phosphatase: 118 U/L (ref 38–126)
Anion gap: 14 (ref 5–15)
BUN: 26 mg/dL — ABNORMAL HIGH (ref 8–23)
CO2: 25 mmol/L (ref 22–32)
Calcium: 8.5 mg/dL — ABNORMAL LOW (ref 8.9–10.3)
Chloride: 84 mmol/L — ABNORMAL LOW (ref 98–111)
Creatinine, Ser: 1.02 mg/dL (ref 0.61–1.24)
GFR, Estimated: >60 mL/min (ref >60)
Glucose, Bld: 129 mg/dL — ABNORMAL HIGH (ref 70–99)
Potassium: 3.7 mmol/L (ref 3.5–5.1)
Sodium: 123 mmol/L — ABNORMAL LOW (ref 135–145)
Total Bilirubin: 0.8 mg/dL (ref 0.3–1.2)
Total Protein: 7.7 g/dL (ref 6.5–8.1)

## 2021-06-21 LAB — CBC WITH DIFFERENTIAL/PLATELET
Abs Immature Granulocytes: 0.03 10*3/uL (ref 0.00–0.07)
Basophils Absolute: 0 10*3/uL (ref 0.0–0.1)
Basophils Relative: 1 %
Eosinophils Absolute: 0 10*3/uL (ref 0.0–0.5)
Eosinophils Relative: 0 %
HCT: 41.3 % (ref 39.0–52.0)
Hemoglobin: 14.7 g/dL (ref 13.0–17.0)
Immature Granulocytes: 0 %
Lymphocytes Relative: 9 %
Lymphs Abs: 0.6 10*3/uL — ABNORMAL LOW (ref 0.7–4.0)
MCH: 33.3 pg (ref 26.0–34.0)
MCHC: 35.6 g/dL (ref 30.0–36.0)
MCV: 93.4 fL (ref 80.0–100.0)
Monocytes Absolute: 0.4 10*3/uL (ref 0.1–1.0)
Monocytes Relative: 6 %
Neutro Abs: 6.1 10*3/uL (ref 1.7–7.7)
Neutrophils Relative %: 84 %
Platelets: 285 10*3/uL (ref 150–400)
RBC: 4.42 MIL/uL (ref 4.22–5.81)
RDW: 13.6 % (ref 11.5–15.5)
WBC: 7.2 10*3/uL (ref 4.0–10.5)
nRBC: 0 % (ref 0.0–0.2)

## 2021-06-21 LAB — BLOOD GAS, VENOUS
Acid-Base Excess: 3.2 mmol/L — ABNORMAL HIGH (ref 0.0–2.0)
Bicarbonate: 29.9 mmol/L — ABNORMAL HIGH (ref 20.0–28.0)
O2 Saturation: 40.4 %
Patient temperature: 37
pCO2, Ven: 53 mmHg (ref 44–60)
pH, Ven: 7.36 (ref 7.25–7.43)
pO2, Ven: <31 mmHg — CL (ref 32–45)

## 2021-06-21 LAB — TROPONIN I (HIGH SENSITIVITY): Troponin I (High Sensitivity): 7 ng/L (ref <18)

## 2021-06-21 LAB — RAPID URINE DRUG SCREEN, HOSP PERFORMED
Amphetamines: NOT DETECTED
Barbiturates: NOT DETECTED
Benzodiazepines: NOT DETECTED
Cocaine: NOT DETECTED
Opiates: NOT DETECTED
Tetrahydrocannabinol: NOT DETECTED

## 2021-06-21 LAB — BRAIN NATRIURETIC PEPTIDE: B Natriuretic Peptide: 77 pg/mL (ref 0.0–100.0)

## 2021-06-21 LAB — RESP PANEL BY RT-PCR (FLU A&B, COVID) ARPGX2
Influenza A by PCR: NEGATIVE
Influenza B by PCR: NEGATIVE
SARS Coronavirus 2 by RT PCR: NEGATIVE

## 2021-06-21 LAB — ETHANOL: Alcohol, Ethyl (B): 42 mg/dL — ABNORMAL HIGH (ref <10)

## 2021-06-21 LAB — D-DIMER, QUANTITATIVE: D-Dimer, Quant: 0.68 ug/mL-FEU — ABNORMAL HIGH (ref 0.00–0.50)

## 2021-06-21 MED ORDER — IOHEXOL 350 MG/ML SOLN
100.0000 mL | Freq: Once | INTRAVENOUS | Status: AC | PRN
  Administered 2021-06-21: 100 mL via INTRAVENOUS

## 2021-06-21 MED ORDER — SODIUM CHLORIDE 0.9 % IV SOLN
500.0000 mg | Freq: Once | INTRAVENOUS | Status: AC
  Administered 2021-06-22: 500 mg via INTRAVENOUS
  Filled 2021-06-21: qty 5

## 2021-06-21 MED ORDER — SODIUM CHLORIDE 0.9 % IV SOLN: INTRAVENOUS | Status: DC

## 2021-06-21 MED ORDER — SODIUM CHLORIDE 0.9 % IV SOLN
1.0000 g | Freq: Once | INTRAVENOUS | Status: AC
  Administered 2021-06-22: 1 g via INTRAVENOUS
  Filled 2021-06-21 (×2): qty 10

## 2021-06-21 NOTE — ED Provider Notes (Signed)
[facility] DEPT Provider Note   CSN: [id number] Arrival date & time: 06/21/21  1626     History  Chief Complaint  Patient presents with   Loss of Consciousness    Ronnie Dunn is a 61 y.o. male.  HPI Patient presenting for evaluation of syncope.  He lives at home.  Apparently the seizure was witnessed.  EMS was summoned and found him hypoxic at 81% on room air.  Patient was with his wife at the time and she noticed that he suddenly turned pale then began shaking with both hands drawn up to his chin.  This shaking occurred for about 30 seconds after which he was slow to arouse but gradually started talking.  Apparently he was altered when EMS found him.  He has not had to use his albuterol inhaler recently.  He does not use oxygen at home.  He denies other problems.  His wife states the episode came on suddenly while he was lying in bed.  Patient feels better now and wants to go home.  He saw his cardiologist for a medical clearance for colonoscopy on 06/01/2021.    Home Medications Prior to Admission medications   Medication Sig Start Date End Date Taking? Authorizing Provider  albuterol (VENTOLIN HFA) 108 (90 Base) MCG/ACT inhaler Inhale 2 puffs into the lungs every 4 (four) hours as needed. 04/21/21  Yes [provider]  atorvastatin (LIPITOR) 10 MG tablet Take 10 mg by mouth daily.   Yes [provider]  cetirizine (ZYRTEC) 10 MG tablet Take 10 mg by mouth daily. 04/08/21  Yes [provider]  dapagliflozin propanediol (FARXIGA) 10 MG TABS tablet Take 1 tablet (10 mg total) by mouth daily before breakfast. 06/01/21  Yes Chandrasekhar, Mahesh A, MD  famotidine (PEPCID) 40 MG tablet Take 40 mg by mouth daily.   Yes [provider]  Fluticasone-Umeclidin-Vilant (TRELEGY ELLIPTA) 100-62.5-25 MCG/INH AEPB Inhale 1 puff into the lungs daily. 04/09/20  Yes Gaylan Gerold, DO  furosemide (LASIX) 40 MG tablet Take 120 mg by mouth  every morning. 05/25/21  Yes [provider]  magnesium oxide (MAG-OX) 400 MG tablet Take 400 mg by mouth daily.   Yes [provider]  metolazone (ZAROXOLYN) 2.5 MG tablet Take 1 tablet (2.5 mg total) by mouth 2 (two) times a week. 06/17/21  Yes Chandrasekhar, Mahesh A, MD  metoprolol succinate (TOPROL-XL) 100 MG 24 hr tablet Take 150 mg by mouth daily. 05/10/21  Yes [provider]  rivaroxaban (XARELTO) 20 MG TABS tablet Take 1 tablet (20 mg total) by mouth daily with supper. 06/01/21  Yes Chandrasekhar, Mahesh A, MD  spironolactone (ALDACTONE) 25 MG tablet Take 0.5 tablets (12.5 mg total) by mouth daily. 06/16/21  Yes Chandrasekhar, Mahesh A, MD  traZODone (DESYREL) 50 MG tablet Take 50 mg by mouth daily as needed for sleep (anxiety).   Yes [provider]  doxazosin (CARDURA) 4 MG tablet Take 1 tablet (4 mg total) by mouth at bedtime. Patient not taking: Reported on 06/21/2021 05/28/21   [name], PA-C      Allergies    Patient has no known allergies.    Review of Systems   Review of Systems  Physical Exam Updated Vital Signs BP (!) 102/44   Pulse 91   Temp (!) 97.5 F (36.4 C)   Resp (!) 23   Ht 6\' 2"  (1.88 m)   Wt 121 kg   SpO2 96%   BMI 34.25 kg/m  Physical Exam Vitals and nursing note reviewed.  Constitutional:      General: He is not in acute distress.    Appearance: He is well-developed. He is obese. He is not ill-appearing, toxic-appearing or diaphoretic.  HENT:     Head: Normocephalic and atraumatic.     Right Ear: External ear normal.     Left Ear: External ear normal.  Eyes:     Conjunctiva/sclera: Conjunctivae normal.     Pupils: Pupils are equal, round, and reactive to light.  Neck:     Trachea: Phonation normal.  Cardiovascular:     Rate and Rhythm: Normal rate and regular rhythm.     Heart sounds: Normal heart sounds. No murmur heard. Pulmonary:     Effort: Pulmonary effort is normal. No respiratory distress.      Breath sounds: Normal breath sounds. No stridor. No rhonchi.  Chest:     Chest wall: No tenderness.  Abdominal:     General: There is distension.     Palpations: Abdomen is soft. There is no mass.     Tenderness: There is no abdominal tenderness.     Hernia: No hernia is present.  Musculoskeletal:        General: Normal range of motion.     Cervical back: Normal range of motion and neck supple.     Comments: ModerateRight greater than left lower leg swelling.  Erythema of the entire right lower leg, and partial erythema of the left lower leg, below the midshin level.  Purple hued discoloration of the toes of the left foot, greater than the right foot.  No focal tenderness of either leg.  Skin:    General: Skin is warm and dry.  Neurological:     Mental Status: He is alert and oriented to person, place, and time.     Cranial Nerves: No cranial nerve deficit.     Sensory: No sensory deficit.     Motor: No abnormal muscle tone.     Coordination: Coordination normal.  Psychiatric:        Mood and Affect: Mood normal.        Behavior: Behavior normal.        Thought Content: Thought content normal.        Judgment: Judgment normal.    ED Results / Procedures / Treatments   Labs (all labs ordered are listed, but only abnormal results are displayed) Labs Reviewed  BLOOD GAS, VENOUS - Abnormal; Notable for the following components:      Result Value   pO2, Ven <31 (*)    Bicarbonate 29.9 (*)    Acid-Base Excess 3.2 (*)    All other components within normal limits  CBC WITH DIFFERENTIAL/PLATELET - Abnormal; Notable for the following components:   Lymphs Abs 0.6 (*)    All other components within normal limits  D-DIMER, QUANTITATIVE - Abnormal; Notable for the following components:   D-Dimer, Quant 0.68 (*)    All other components within normal limits  ETHANOL - Abnormal; Notable for the following components:   Alcohol, Ethyl (B) 42 (*)    All other components within normal limits   COMPREHENSIVE METABOLIC PANEL - Abnormal; Notable for the following components:   Sodium 123 (*)    Chloride 84 (*)    Glucose, Bld 129 (*)    BUN 26 (*)    Calcium 8.5 (*)    Albumin 3.3 (*)    All other components within normal limits  RESP PANEL BY  RT-PCR (FLU A&B, COVID) ARPGX2  RAPID URINE DRUG SCREEN, HOSP PERFORMED  BRAIN NATRIURETIC PEPTIDE  TROPONIN I (HIGH SENSITIVITY)  TROPONIN I (HIGH SENSITIVITY)    EKG EKG Interpretation  Date/Time:  Monday June 21 2021 17:56:27 EDT Ventricular Rate:  68 PR Interval:    QRS Duration: 128 QT Interval:  438 QTC Calculation: 466 R Axis:   84 Text Interpretation: Atrial fibrillation Nonspecific intraventricular conduction delay Minimal ST elevation, anterior leads Since last tracing now in Atrial fibrillation Otherwise no significant change Confirmed by Daleen Bo [phone]) on 06/21/2021 10:59:04 PM  Radiology CT Head Wo Contrast  Result Date: 06/21/2021 CLINICAL DATA:  Syncopal episode for 30 seconds with bowel incontinence, nausea and dizziness, weakness. EXAM: CT HEAD WITHOUT CONTRAST TECHNIQUE: Contiguous axial images were obtained from the base of the skull through the vertex without intravenous contrast. RADIATION DOSE REDUCTION: This exam was performed according to the departmental dose-optimization program which includes automated exposure control, adjustment of the mA and/or kV according to patient size and/or use of iterative reconstruction technique. COMPARISON:  MRI brain 03/29/2020 and CT head 03/28/2020 FINDINGS: Brain: The brainstem, cerebellum, cerebral peduncles, thalami, basal ganglia, basilar cisterns, and ventricular system appear within normal limits. No intracranial hemorrhage, mass lesion, or acute CVA. Vascular: There is atherosclerotic calcification of the cavernous carotid arteries bilaterally. Skull: Unremarkable Sinuses/Orbits: Unremarkable Other: No supplemental non-categorized findings. IMPRESSION: 1. No acute  intracranial findings. 2. Atherosclerosis. Electronically Signed   By: Van Clines M.D.   On: 06/21/2021 21:48   CT Angio Chest PE W/Cm &/Or Wo Cm  Result Date: 06/21/2021 CLINICAL DATA:  Weakness, dizziness, nausea, syncope with bowel incontinence. Hypoxia. EXAM: CT ANGIOGRAPHY CHEST WITH CONTRAST TECHNIQUE: Multidetector CT imaging of the chest was performed using the standard protocol during bolus administration of intravenous contrast. Multiplanar CT image reconstructions and MIPs were obtained to evaluate the vascular anatomy. RADIATION DOSE REDUCTION: This exam was performed according to the departmental dose-optimization program which includes automated exposure control, adjustment of the mA and/or kV according to patient size and/or use of iterative reconstruction technique. CONTRAST:  119mL OMNIPAQUE IOHEXOL 350 MG/ML SOLN COMPARISON:  Chest radiograph 06/21/2021 FINDINGS: Despite efforts by the technologist and patient, motion artifact is present on today's exam and could not be eliminated. This reduces exam sensitivity and specificity. Cardiovascular: No filling defect is identified in the pulmonary arterial tree to suggest pulmonary embolus. Coronary, aortic arch, and branch vessel atherosclerotic vascular disease. Mild cardiomegaly. Mediastinum/Nodes: Scattered small mediastinal lymph nodes are not pathologically enlarged by size criteria. Lungs/Pleura: Mild paraseptal emphysema the lung apices. Patchy and nodular airspace opacities posteriorly in the right upper lobe and in the right lower lobe. Similar indistinct nodular opacities in the left lower lobe. Appearance suspicious for aspiration pneumonitis although multilobar pneumonia could have a similar appearance. Upper Abdomen: Abdominal aortic atherosclerosis. Atherosclerotic calcification in the origins of the celiac trunk and SMA. Musculoskeletal: Thoracic spondylosis. Degenerative spurring at the sternoclavicular joints. Mild deformity  of the left anterior fourth and fifth ribs likely from old healed fractures. Review of the MIP images confirms the above findings. IMPRESSION: 1. No filling defect is identified in the pulmonary arterial tree to suggest pulmonary embolus. Reduced sensitivity due to motion artifact. 2. Patchy and nodular airspace opacities especially in the right lower lobe but also posteriorly in the right upper lobe and in the left lower lobe. Appearance suspicious for aspiration pneumonitis although multilobar pneumonia could have a similar appearance. 3. Aortic Atherosclerosis (ICD10-I70.0) and Emphysema (ICD10-J43.9). Coronary atherosclerosis,  systemic atherosclerosis, and mild cardiomegaly. Electronically Signed   By: Gaylyn Rong M.D.   On: 06/21/2021 21:56   DG Chest Port 1 View  Result Date: 06/21/2021 CLINICAL DATA:  Syncope EXAM: PORTABLE CHEST 1 VIEW COMPARISON:  March 28, 2020 FINDINGS: Mild cardiac enlargement and central vascular prominence. No overt pulmonary edema or focal airspace consolidation. No visible pleural effusion or pneumothorax. The visualized skeletal structures are unremarkable. IMPRESSION: Cardiomegaly and central vascular prominence without overt pulmonary edema or focal airspace consolidation. Electronically Signed   By: Maudry Mayhew M.D.   On: 06/21/2021 18:06    Procedures Procedures    Medications Ordered in ED Medications  0.9 %  sodium chloride infusion ( Intravenous New Bag/Given 06/21/21 1824)  cefTRIAXone (ROCEPHIN) 1 g in sodium chloride 0.9 % 100 mL IVPB (has no administration in time range)  azithromycin (ZITHROMAX) 500 mg in sodium chloride 0.9 % 250 mL IVPB (has no administration in time range)  iohexol (OMNIPAQUE) 350 MG/ML injection 100 mL (100 mLs Intravenous Contrast Given 06/21/21 2129)    ED Course/ Medical Decision Making/ A&P                           Medical Decision Making Patient presenting for syncopal episode, possibly associated with seizure.  On  arrival he is alert and lucid.  Per EMS he was hypoxic to 81% on room air.  This may have been associated with a postictal state.  Problems Addressed: Community acquired pneumonia, unspecified laterality: acute illness or injury with systemic symptoms that poses a threat to life or bodily functions    Details: Associated with hypoxia requiring oxygen supplementation Hyponatremia: acute illness or injury with systemic symptoms that poses a threat to life or bodily functions    Details: Incidental, possibly related to his shaking or seizing episode. Hypoxia: acute illness or injury that poses a threat to life or bodily functions    Details: Secondary to pneumonia Syncope, unspecified syncope type: acute illness or injury with systemic symptoms that poses a threat to life or bodily functions    Details: Unclear etiology.  Amount and/or Complexity of Data Reviewed Independent Historian:     Details: He is a cogent historian.  Wife gives some history. External Data Reviewed: notes.    Details: Patient with history of atrial fibrillation, hypertension, hyperlipidemia, diabetes, COPD, seizure and alcohol abuse disorder.  He is anticoagulated with Xarelto.  He takes metoprolol for rate control.  He takes Lasix for peripheral edema.  He has a history of seizure that was attributed to hyponatremia, and 2022. Labs: ordered.    Details: CBC, metabolic panel, venous blood gas, D-dimer, alcohol level, viral respiratory panel, urine drug screen, BNP, troponin-normal except sodium low, chloride low, glucose high, BUN high, calcium low, albumin low, D-dimer high. Radiology: ordered and independent interpretation performed.    Details: Chest x-ray, CT chest-consistent with multifocal pneumonia bilaterally.  No evidence for PE.  Mild vascular prominence. Discussion of management or test interpretation with external provider(s): Consult hospitalist to admit.  Recommending treatment for pneumonia, seizure evaluation  and treatment for hyponatremia.  Risk Prescription drug management. Decision regarding hospitalization. Risk Details: Patient presenting to the ED for evaluation of syncope, was found to have hypoxia by EMS.  Wife describes a seizure.  He does not have a known seizure history other than single episode when he was diagnosed with hyponatremia.  He is ongoing smoker, who is treated with medications for asthma/COPD.  ED evaluation consistent with pneumonia likely explaining hypoxia, and recurrent hyponatremia, likely multifactorial.  No evidence for PE or acute congestive heart failure.  Started on community-acquired pneumonia treatment.  Patient will require admission for stabilization.  Critical Care Total time providing critical care: 55 minutes               Final Clinical Impression(s) / ED Diagnoses Final diagnoses:  Community acquired pneumonia, unspecified laterality  Hypoxia  Hyponatremia  Syncope, unspecified syncope type    Rx / DC Orders ED Discharge Orders     None         Daleen Bo, MD 06/22/21 0002

## 2021-06-21 NOTE — ED Triage Notes (Addendum)
BIBA from home. Generalized weakness, dizziness, nausea PTA went to lay on couch and had witness syncopal episode for 30 seconds with incontinent bowel. 81% RA with ems. Denies hx of seizures. A&Ox4 on arrival and slow to respond. Hx CHF and afib, patient reports MD increased dose of diuretics.

## 2021-06-21 NOTE — Telephone Encounter (Signed)
Calling to say patient is being taking to the hospital. And she will call back and cancel is appt if they will keep him. Please advise

## 2021-06-22 ENCOUNTER — Inpatient Hospital Stay (HOSPITAL_COMMUNITY)
Admit: 2021-06-22 | Discharge: 2021-06-22 | Disposition: A | Discharge status: Home / Self Care | Payer: 59 | Attending: Internal Medicine | Admitting: Internal Medicine

## 2021-06-22 ENCOUNTER — Encounter (HOSPITAL_COMMUNITY): Payer: Self-pay | Admitting: Internal Medicine

## 2021-06-22 ENCOUNTER — Inpatient Hospital Stay (HOSPITAL_COMMUNITY): Payer: 59

## 2021-06-22 DIAGNOSIS — I4891 Unspecified atrial fibrillation: Secondary | ICD-10-CM

## 2021-06-22 DIAGNOSIS — I700 Atherosclerosis of aorta: Secondary | ICD-10-CM | POA: Diagnosis present

## 2021-06-22 DIAGNOSIS — F17210 Nicotine dependence, cigarettes, uncomplicated: Secondary | ICD-10-CM | POA: Diagnosis present

## 2021-06-22 DIAGNOSIS — J439 Emphysema, unspecified: Secondary | ICD-10-CM | POA: Diagnosis present

## 2021-06-22 DIAGNOSIS — E440 Moderate protein-calorie malnutrition: Secondary | ICD-10-CM | POA: Diagnosis present

## 2021-06-22 DIAGNOSIS — E43 Unspecified severe protein-calorie malnutrition: Secondary | ICD-10-CM | POA: Diagnosis present

## 2021-06-22 DIAGNOSIS — Z7901 Long term (current) use of anticoagulants: Secondary | ICD-10-CM | POA: Diagnosis not present

## 2021-06-22 DIAGNOSIS — R569 Unspecified convulsions: Secondary | ICD-10-CM

## 2021-06-22 DIAGNOSIS — E1165 Type 2 diabetes mellitus with hyperglycemia: Secondary | ICD-10-CM | POA: Diagnosis not present

## 2021-06-22 DIAGNOSIS — I5032 Chronic diastolic (congestive) heart failure: Secondary | ICD-10-CM | POA: Diagnosis present

## 2021-06-22 DIAGNOSIS — R809 Proteinuria, unspecified: Secondary | ICD-10-CM | POA: Diagnosis present

## 2021-06-22 DIAGNOSIS — I4821 Permanent atrial fibrillation: Secondary | ICD-10-CM | POA: Diagnosis present

## 2021-06-22 DIAGNOSIS — E669 Obesity, unspecified: Secondary | ICD-10-CM | POA: Diagnosis present

## 2021-06-22 DIAGNOSIS — Z20822 Contact with and (suspected) exposure to covid-19: Secondary | ICD-10-CM | POA: Diagnosis present

## 2021-06-22 DIAGNOSIS — Z79899 Other long term (current) drug therapy: Secondary | ICD-10-CM | POA: Diagnosis not present

## 2021-06-22 DIAGNOSIS — Z91148 Patient's other noncompliance with medication regimen for other reason: Secondary | ICD-10-CM | POA: Diagnosis not present

## 2021-06-22 DIAGNOSIS — J181 Lobar pneumonia, unspecified organism: Secondary | ICD-10-CM | POA: Diagnosis present

## 2021-06-22 DIAGNOSIS — R55 Syncope and collapse: Secondary | ICD-10-CM | POA: Diagnosis not present

## 2021-06-22 DIAGNOSIS — Z6835 Body mass index (BMI) 35.0-35.9, adult: Secondary | ICD-10-CM | POA: Diagnosis not present

## 2021-06-22 DIAGNOSIS — G4733 Obstructive sleep apnea (adult) (pediatric): Secondary | ICD-10-CM | POA: Diagnosis present

## 2021-06-22 DIAGNOSIS — F1010 Alcohol abuse, uncomplicated: Secondary | ICD-10-CM | POA: Diagnosis present

## 2021-06-22 DIAGNOSIS — I712 Thoracic aortic aneurysm, without rupture, unspecified: Secondary | ICD-10-CM | POA: Diagnosis present

## 2021-06-22 DIAGNOSIS — Z8249 Family history of ischemic heart disease and other diseases of the circulatory system: Secondary | ICD-10-CM | POA: Diagnosis not present

## 2021-06-22 DIAGNOSIS — J189 Pneumonia, unspecified organism: Secondary | ICD-10-CM | POA: Diagnosis not present

## 2021-06-22 DIAGNOSIS — I2510 Atherosclerotic heart disease of native coronary artery without angina pectoris: Secondary | ICD-10-CM | POA: Diagnosis present

## 2021-06-22 DIAGNOSIS — K219 Gastro-esophageal reflux disease without esophagitis: Secondary | ICD-10-CM | POA: Diagnosis present

## 2021-06-22 DIAGNOSIS — I110 Hypertensive heart disease with heart failure: Secondary | ICD-10-CM | POA: Diagnosis present

## 2021-06-22 DIAGNOSIS — E871 Hypo-osmolality and hyponatremia: Secondary | ICD-10-CM | POA: Diagnosis present

## 2021-06-22 DIAGNOSIS — Y902 Blood alcohol level of 40-59 mg/100 ml: Secondary | ICD-10-CM | POA: Diagnosis present

## 2021-06-22 LAB — EXPECTORATED SPUTUM ASSESSMENT W GRAM STAIN, RFLX TO RESP C

## 2021-06-22 LAB — STREP PNEUMONIAE URINARY ANTIGEN: Strep Pneumo Urinary Antigen: NEGATIVE

## 2021-06-22 LAB — URINALYSIS, COMPLETE (UACMP) WITH MICROSCOPIC
Bacteria, UA: NONE SEEN
Bilirubin Urine: NEGATIVE
Glucose, UA: 50 mg/dL — AB
Ketones, ur: NEGATIVE mg/dL
Leukocytes,Ua: NEGATIVE
Nitrite: NEGATIVE
Protein, ur: >=300 mg/dL — AB
Specific Gravity, Urine: >1.046 — ABNORMAL HIGH (ref 1.005–1.030)
pH: 5 (ref 5.0–8.0)

## 2021-06-22 LAB — COMPREHENSIVE METABOLIC PANEL
ALT: 8 U/L (ref 0–44)
AST: 15 U/L (ref 15–41)
Albumin: 2.8 g/dL — ABNORMAL LOW (ref 3.5–5.0)
Alkaline Phosphatase: 94 U/L (ref 38–126)
Anion gap: 11 (ref 5–15)
BUN: 27 mg/dL — ABNORMAL HIGH (ref 8–23)
CO2: 26 mmol/L (ref 22–32)
Calcium: 8 mg/dL — ABNORMAL LOW (ref 8.9–10.3)
Chloride: 86 mmol/L — ABNORMAL LOW (ref 98–111)
Creatinine, Ser: 0.99 mg/dL (ref 0.61–1.24)
GFR, Estimated: >60 mL/min (ref >60)
Glucose, Bld: 115 mg/dL — ABNORMAL HIGH (ref 70–99)
Potassium: 3.7 mmol/L (ref 3.5–5.1)
Sodium: 123 mmol/L — ABNORMAL LOW (ref 135–145)
Total Bilirubin: 1.1 mg/dL (ref 0.3–1.2)
Total Protein: 6.8 g/dL (ref 6.5–8.1)

## 2021-06-22 LAB — BASIC METABOLIC PANEL
Anion gap: 13 (ref 5–15)
BUN: 22 mg/dL (ref 8–23)
CO2: 24 mmol/L (ref 22–32)
Calcium: 8 mg/dL — ABNORMAL LOW (ref 8.9–10.3)
Chloride: 90 mmol/L — ABNORMAL LOW (ref 98–111)
Creatinine, Ser: 0.91 mg/dL (ref 0.61–1.24)
GFR, Estimated: >60 mL/min (ref >60)
Glucose, Bld: 142 mg/dL — ABNORMAL HIGH (ref 70–99)
Potassium: 3 mmol/L — ABNORMAL LOW (ref 3.5–5.1)
Sodium: 127 mmol/L — ABNORMAL LOW (ref 135–145)

## 2021-06-22 LAB — CBC WITH DIFFERENTIAL/PLATELET
Abs Immature Granulocytes: 0.04 10*3/uL (ref 0.00–0.07)
Basophils Absolute: 0 10*3/uL (ref 0.0–0.1)
Basophils Relative: 1 %
Eosinophils Absolute: 0 10*3/uL (ref 0.0–0.5)
Eosinophils Relative: 0 %
HCT: 36.7 % — ABNORMAL LOW (ref 39.0–52.0)
Hemoglobin: 13 g/dL (ref 13.0–17.0)
Immature Granulocytes: 1 %
Lymphocytes Relative: 9 %
Lymphs Abs: 0.7 10*3/uL (ref 0.7–4.0)
MCH: 32.8 pg (ref 26.0–34.0)
MCHC: 35.4 g/dL (ref 30.0–36.0)
MCV: 92.7 fL (ref 80.0–100.0)
Monocytes Absolute: 0.7 10*3/uL (ref 0.1–1.0)
Monocytes Relative: 9 %
Neutro Abs: 6.3 10*3/uL (ref 1.7–7.7)
Neutrophils Relative %: 80 %
Platelets: 290 10*3/uL (ref 150–400)
RBC: 3.96 MIL/uL — ABNORMAL LOW (ref 4.22–5.81)
RDW: 13.3 % (ref 11.5–15.5)
WBC: 7.8 10*3/uL (ref 4.0–10.5)
nRBC: 0 % (ref 0.0–0.2)

## 2021-06-22 LAB — ECHOCARDIOGRAM COMPLETE
AR max vel: 2.45 cm2
AV Area VTI: 2.69 cm2
AV Area mean vel: 2.54 cm2
AV Mean grad: 5 mmHg
AV Peak grad: 9.1 mmHg
Ao pk vel: 1.51 m/s
Area-P 1/2: 5.54 cm2
Height: 74 in
S' Lateral: 3.1 cm
Weight: 4268.11 oz

## 2021-06-22 LAB — OSMOLALITY, URINE: Osmolality, Ur: 398 mOsm/kg (ref 300–900)

## 2021-06-22 LAB — GLUCOSE, CAPILLARY: Glucose-Capillary: 187 mg/dL — ABNORMAL HIGH (ref 70–99)

## 2021-06-22 LAB — MAGNESIUM
Magnesium: 1.5 mg/dL — ABNORMAL LOW (ref 1.7–2.4)
Magnesium: 1.6 mg/dL — ABNORMAL LOW (ref 1.7–2.4)

## 2021-06-22 LAB — CREATININE, URINE, RANDOM: Creatinine, Urine: 109.85 mg/dL

## 2021-06-22 LAB — OSMOLALITY: Osmolality: 261 mOsm/kg — ABNORMAL LOW (ref 275–295)

## 2021-06-22 LAB — TSH: TSH: 1.836 u[IU]/mL (ref 0.350–4.500)

## 2021-06-22 LAB — SODIUM, URINE, RANDOM: Sodium, Ur: <10 mmol/L

## 2021-06-22 MED ORDER — LORAZEPAM 2 MG/ML IJ SOLN: 1.0000 mg | INTRAMUSCULAR | Status: DC | PRN

## 2021-06-22 MED ORDER — FOLIC ACID 1 MG PO TABS
1.0000 mg | ORAL_TABLET | Freq: Every day | ORAL | Status: DC
  Administered 2021-06-22 – 2021-06-25 (×4): 1 mg via ORAL
  Filled 2021-06-22 (×4): qty 1

## 2021-06-22 MED ORDER — MAGNESIUM SULFATE 2 GM/50ML IV SOLN
2.0000 g | Freq: Once | INTRAVENOUS | Status: AC
  Administered 2021-06-22: 2 g via INTRAVENOUS
  Filled 2021-06-22: qty 50

## 2021-06-22 MED ORDER — FAMOTIDINE 20 MG PO TABS
40.0000 mg | ORAL_TABLET | Freq: Every day | ORAL | Status: DC
  Administered 2021-06-22 – 2021-06-25 (×4): 40 mg via ORAL
  Filled 2021-06-22 (×4): qty 2

## 2021-06-22 MED ORDER — PANTOPRAZOLE SODIUM 40 MG PO TBEC
40.0000 mg | DELAYED_RELEASE_TABLET | Freq: Every day | ORAL | Status: DC
  Administered 2021-06-22 – 2021-06-25 (×4): 40 mg via ORAL
  Filled 2021-06-22 (×4): qty 1

## 2021-06-22 MED ORDER — SODIUM CHLORIDE 0.9 % IV SOLN
500.0000 mg | INTRAVENOUS | Status: DC
  Administered 2021-06-22 – 2021-06-23 (×2): 500 mg via INTRAVENOUS
  Filled 2021-06-22 (×2): qty 5

## 2021-06-22 MED ORDER — UMECLIDINIUM BROMIDE 62.5 MCG/ACT IN AEPB
1.0000 | INHALATION_SPRAY | Freq: Every day | RESPIRATORY_TRACT | Status: DC
  Administered 2021-06-22 – 2021-06-23 (×2): 1 via RESPIRATORY_TRACT
  Filled 2021-06-22: qty 7

## 2021-06-22 MED ORDER — TRAZODONE HCL 50 MG PO TABS
50.0000 mg | ORAL_TABLET | Freq: Every day | ORAL | Status: DC | PRN
  Administered 2021-06-22 – 2021-06-24 (×3): 50 mg via ORAL
  Filled 2021-06-22 (×3): qty 1

## 2021-06-22 MED ORDER — MAGNESIUM OXIDE -MG SUPPLEMENT 400 (240 MG) MG PO TABS
400.0000 mg | ORAL_TABLET | Freq: Every day | ORAL | Status: DC
  Administered 2021-06-22 – 2021-06-25 (×4): 400 mg via ORAL
  Filled 2021-06-22 (×4): qty 1

## 2021-06-22 MED ORDER — ATORVASTATIN CALCIUM 10 MG PO TABS
10.0000 mg | ORAL_TABLET | Freq: Every day | ORAL | Status: DC
  Administered 2021-06-22 – 2021-06-25 (×4): 10 mg via ORAL
  Filled 2021-06-22 (×4): qty 1

## 2021-06-22 MED ORDER — THIAMINE HCL 100 MG/ML IJ SOLN: 100.0000 mg | Freq: Every day | INTRAMUSCULAR | Status: DC

## 2021-06-22 MED ORDER — DAPAGLIFLOZIN PROPANEDIOL 10 MG PO TABS
10.0000 mg | ORAL_TABLET | Freq: Every day | ORAL | Status: DC
  Administered 2021-06-22 – 2021-06-24 (×3): 10 mg via ORAL
  Filled 2021-06-22 (×4): qty 1

## 2021-06-22 MED ORDER — LORATADINE 10 MG PO TABS
10.0000 mg | ORAL_TABLET | Freq: Every day | ORAL | Status: DC
  Administered 2021-06-22 – 2021-06-25 (×4): 10 mg via ORAL
  Filled 2021-06-22 (×4): qty 1

## 2021-06-22 MED ORDER — ADULT MULTIVITAMIN W/MINERALS CH
1.0000 | ORAL_TABLET | Freq: Every day | ORAL | Status: DC
  Administered 2021-06-22 – 2021-06-25 (×4): 1 via ORAL
  Filled 2021-06-22 (×4): qty 1

## 2021-06-22 MED ORDER — SODIUM CHLORIDE 0.9 % IV SOLN
1.0000 g | INTRAVENOUS | Status: DC
  Administered 2021-06-22 – 2021-06-23 (×2): 1 g via INTRAVENOUS
  Filled 2021-06-22 (×2): qty 10

## 2021-06-22 MED ORDER — FLUTICASONE FUROATE-VILANTEROL 100-25 MCG/ACT IN AEPB
1.0000 | INHALATION_SPRAY | Freq: Every day | RESPIRATORY_TRACT | Status: DC
  Administered 2021-06-22 – 2021-06-24 (×3): 1 via RESPIRATORY_TRACT
  Filled 2021-06-22: qty 28

## 2021-06-22 MED ORDER — METOPROLOL SUCCINATE ER 50 MG PO TB24
150.0000 mg | ORAL_TABLET | Freq: Every day | ORAL | Status: DC
  Administered 2021-06-23 – 2021-06-25 (×3): 150 mg via ORAL
  Filled 2021-06-22 (×5): qty 3

## 2021-06-22 MED ORDER — SPIRONOLACTONE 12.5 MG HALF TABLET: 12.5000 mg | ORAL_TABLET | Freq: Every day | ORAL | Status: DC

## 2021-06-22 MED ORDER — LORAZEPAM 1 MG PO TABS
0.0000 mg | ORAL_TABLET | Freq: Three times a day (TID) | ORAL | Status: DC
  Filled 2021-06-22: qty 2

## 2021-06-22 MED ORDER — RIVAROXABAN 20 MG PO TABS
20.0000 mg | ORAL_TABLET | Freq: Every day | ORAL | Status: DC
  Administered 2021-06-22 – 2021-06-24 (×3): 20 mg via ORAL
  Filled 2021-06-22 (×3): qty 1

## 2021-06-22 MED ORDER — ACETAMINOPHEN 650 MG RE SUPP: 650.0000 mg | Freq: Four times a day (QID) | RECTAL | Status: DC | PRN

## 2021-06-22 MED ORDER — LORAZEPAM 1 MG PO TABS
1.0000 mg | ORAL_TABLET | ORAL | Status: DC | PRN
  Administered 2021-06-24: 1 mg via ORAL
  Administered 2021-06-25: 2 mg via ORAL
  Administered 2021-06-25: 1 mg via ORAL
  Filled 2021-06-22 (×2): qty 1
  Filled 2021-06-22: qty 2

## 2021-06-22 MED ORDER — INSULIN ASPART 100 UNIT/ML IJ SOLN
0.0000 [IU] | Freq: Three times a day (TID) | INTRAMUSCULAR | Status: DC
  Administered 2021-06-23 (×2): 1 [IU] via SUBCUTANEOUS
  Administered 2021-06-24: 2 [IU] via SUBCUTANEOUS
  Administered 2021-06-24: 5 [IU] via SUBCUTANEOUS
  Administered 2021-06-24: 2 [IU] via SUBCUTANEOUS

## 2021-06-22 MED ORDER — ALBUTEROL SULFATE (2.5 MG/3ML) 0.083% IN NEBU: 2.5000 mg | INHALATION_SOLUTION | RESPIRATORY_TRACT | Status: DC | PRN

## 2021-06-22 MED ORDER — ACETAMINOPHEN 325 MG PO TABS: 650.0000 mg | ORAL_TABLET | Freq: Four times a day (QID) | ORAL | Status: DC | PRN

## 2021-06-22 MED ORDER — LORAZEPAM 1 MG PO TABS: 0.0000 mg | ORAL_TABLET | ORAL | Status: AC

## 2021-06-22 MED ORDER — THIAMINE HCL 100 MG PO TABS
100.0000 mg | ORAL_TABLET | Freq: Every day | ORAL | Status: DC
  Administered 2021-06-22 – 2021-06-25 (×4): 100 mg via ORAL
  Filled 2021-06-22 (×4): qty 1

## 2021-06-22 MED ORDER — LACTATED RINGERS IV BOLUS
1000.0000 mL | Freq: Once | INTRAVENOUS | Status: AC
  Administered 2021-06-22: 1000 mL via INTRAVENOUS

## 2021-06-22 NOTE — Progress Notes (Signed)
  Echocardiogram 2D Echocardiogram has been performed.  Rodrigo Ran 06/22/2021, 3:13 PM

## 2021-06-22 NOTE — Progress Notes (Signed)
  Carryover admission to the Day Admitter.  I discussed this case with the EDP, Dr.Wentz.  Per these discussions:  This is a 61 year old male with history of COPD, no baseline supplemental oxygen requirements, who is being admitted for seizure, with presentation also notable for acute hypoxic respiratory failure, community-acquired pneumonia, and hyponatremia.   Patient had an episode observed by his wife earlier in the day in which he demonstrated evidence of tonic-clonic activity in bilateral hands, which lasted for approximately 30 seconds before spontaneously abating, without any subsequent recurrence of tonic-clonic activity.  Following the resolution of this episode, there is reportedly some evidence of confusion, that has subsequently resolved, with patient now back to baseline mental status.  EMS called, and patient was noted to be hypoxic with initial O2 sats in the low 80s on room air.  He reportedly has a one-time prior history of seizures, which were felt to be associated with hyponatremia.  Not on any antiepileptic medications at home.  He is remained seizure-free in the ED.  Vital signs in the ED notable for the following: Requiring 4 L nasal cannula in order to maintain O2 sats in the mid 90s.  Chest x-ray reportedly shows evidence of bilateral infiltrates.  Of note, wife reported observing no vomiting associate with the above seizure-like episode, he conveys no significant concerns for aspiration.  Started on azithromycin and Rocephin for community-acquired pneumonia.  Serum sodium reported to be 126, relationship to baseline is not entirely clear to me at this time.  He was initially started on normal saline at 125 cc/h.  However, subsequently decreased this to 75 cc/h given increased risk for a component of SIADH given presence of bilateral pneumonia.  Of also ordered urinalysis, random urine sodium, random urine creatinine, random urine osmolality, serum osmolality, TSH.  Ordered every 4  hours BMPs through 1300 on 06/22/2021.  I have placed an order for for inpatient admission to PCU.   I have placed some additional preliminary admit orders via the adult multi-morbid admission order set. I have also ordered standard morning labs.  Seizure precautions.  Prn IV Ativan for seizures.  Flutter valve, spirometry .     Babs Bertin, DO Hospitalist

## 2021-06-22 NOTE — ED Notes (Signed)
Breakfast provided.

## 2021-06-22 NOTE — ED Notes (Signed)
Notified hospitalist regarding pt's hypotension, providing new bolus as ordered.

## 2021-06-22 NOTE — Progress Notes (Signed)
EEG complete - results pending 

## 2021-06-22 NOTE — ED Notes (Signed)
Dinner provided.

## 2021-06-22 NOTE — Procedures (Signed)
Patient Name: HERSHEY KNAUER  MRN: [id number]  Epilepsy Attending: Charlsie Quest  Referring Physician/Provider: Bobette Mo, MD Date: 06/22/2021 Duration: 22.20 mins  Patient history: 61yo M patient had an episode observed by his wife earlier in the day in which he demonstrated evidence of tonic-clonic activity in bilateral hands, which lasted for approximately 30 seconds before spontaneously abating, without any subsequent recurrence of tonic-clonic activity.  Following the resolution of this episode, there is reportedly some evidence of confusion, that has subsequently resolved, with patient now back to baseline mental status. EEG to evaluate for seizure  Level of alertness: Awake  AEDs during EEG study: Ativan  Technical aspects: This EEG study was done with scalp electrodes positioned according to the 10-20 International system of electrode placement. Electrical activity was acquired at a sampling rate of 500Hz  and reviewed with a high frequency filter of 70Hz  and a low frequency filter of 1Hz . EEG data were recorded continuously and digitally stored.   Description: The posterior dominant rhythm consists of 8 Hz activity of moderate voltage (25-35 uV) seen predominantly in posterior head regions, symmetric and reactive to eye opening and eye closing.  Hyperventilation and photic stimulation were not performed.     IMPRESSION: This study is within normal limits. No seizures or epileptiform discharges were seen throughout the recording.  Malayja Freund 

## 2021-06-22 NOTE — H&P (Signed)
History and Physical    Patient: Ronnie Dunn [id number] DOB: 10-16-1960 DOA: 06/21/2021 DOS: the patient was seen and examined on 06/22/2021 PCP: [name], PA-C  Patient coming from: Home  Chief Complaint:  Chief Complaint  Patient presents with   Loss of Consciousness   HPI: Ronnie Dunn is a 61 y.o. male with medical history significant of chronic heart failure with preserved ejection fraction, COPD, class I obesity, hypertension, GERD, OSA, hyponatremia, permanent atrial fibrillation, questionable seizures, thoracic aortic aneurysm without rupture, type II DM who is coming to the emergency department due to loss of consciousness.  He is not a very good historian but his wife stated that he has been complaining of weakness, dizziness and passed out.  He woke up after a minute or so and was confused for a few minutes.  However, the patient's wife stated that he had a similar episode earlier where he was passed out longer than a minute.  The patient stated that he drinks at least 6 cans of beer at least 4 times a week. No chest pain, palpitations, diaphoresis, PND, orthopnea or pitting edema of the lower extremities.  He also smokes cigarettes.He denied fever, chills, rhinorrhea, sore throat, wheezing or hemoptysis.   No abdominal pain, nausea, emesis, diarrhea, constipation, melena or hematochezia.  No flank pain, dysuria, frequency or hematuria.  No polyuria, polydipsia or polyphagia.  ED course: Initial vital signs were temperature 97.5 F, pulse 70, respiration 18, BP 140/67 and O2 sat 88% on room air.  Lab work: CBC was unremarkable.  UDS negative.  Urinalysis with increase of specific gravity, glucosuria of 50, small hemoglobinuria and more than 300 mg of proteinuria.  CMP with a sodium 123 and chloride 84 mmol/L.  Glucose 129, BUN 26 and calcium 8.5 mg/dL.  Albumin was 3.3 g/dL.  The rest of the CMP was normal.  Troponin and BNP were normal.  Imaging: Chest x-ray showed  cardiomegaly and central vascular prominence without overt pulmonary edema or focal airspace consolidation.  CT head negative for acute findings but show atherosclerosis.  CTA with patchy nodular airspace opacity especially in the right lower lobe but also posteriorly in the right upper lobe and in the left lower lobe with suspicion for aspiration pneumonitis although multilobar pneumonia could have similar appearance.  Review of Systems: As mentioned in the history of present illness. All other systems reviewed and are negative.  Past Medical History:  Diagnosis Date   Chronic heart failure with preserved ejection fraction ([place]) 06/01/2021   Chronic obstructive pulmonary disease ([place]) 05/28/2021   Class 1 obesity 03/29/2020   Essential hypertension 03/29/2020   Gastroesophageal reflux disease 05/28/2021   Hyponatremia 03/29/2020   OSA (obstructive sleep apnea) 06/01/2021   Permanent atrial fibrillation ([place]) 05/28/2021   Seizures ([place]) 06/22/2021   Thoracic aortic aneurysm without rupture ([place]) 06/01/2021   Type 2 diabetes mellitus without complication, without long-term current use of insulin ([place]) 05/28/2021   History reviewed. No pertinent surgical history. Social History:  reports that he has been smoking cigarettes. He started smoking about 45 years ago. He has been smoking an average of .5 packs per day. He has never used smokeless tobacco. He reports current alcohol use of about 20.0 standard drinks per week. He reports that he does not currently use drugs.  No Known Allergies  Family History  Problem Relation Age of Onset   Hypertension Other     Prior to Admission medications   Medication Sig Start Date End  Date Taking? Authorizing Provider  albuterol (VENTOLIN HFA) 108 (90 Base) MCG/ACT inhaler Inhale 2 puffs into the lungs every 4 (four) hours as needed. 04/21/21  Yes [provider]  atorvastatin (LIPITOR) 10 MG tablet Take 10 mg by mouth daily.   Yes [provider]   cetirizine (ZYRTEC) 10 MG tablet Take 10 mg by mouth daily. 04/08/21  Yes [provider]  dapagliflozin propanediol (FARXIGA) 10 MG TABS tablet Take 1 tablet (10 mg total) by mouth daily before breakfast. 06/01/21  Yes Chandrasekhar, Mahesh A, MD  famotidine (PEPCID) 40 MG tablet Take 40 mg by mouth daily.   Yes [provider]  Fluticasone-Umeclidin-Vilant (TRELEGY ELLIPTA) 100-62.5-25 MCG/INH AEPB Inhale 1 puff into the lungs daily. 04/09/20  Yes Gaylan Gerold, DO  furosemide (LASIX) 40 MG tablet Take 120 mg by mouth every morning. 05/25/21  Yes [provider]  magnesium oxide (MAG-OX) 400 MG tablet Take 400 mg by mouth daily.   Yes [provider]  metolazone (ZAROXOLYN) 2.5 MG tablet Take 1 tablet (2.5 mg total) by mouth 2 (two) times a week. 06/17/21  Yes Chandrasekhar, Mahesh A, MD  metoprolol succinate (TOPROL-XL) 100 MG 24 hr tablet Take 150 mg by mouth daily. 05/10/21  Yes [provider]  rivaroxaban (XARELTO) 20 MG TABS tablet Take 1 tablet (20 mg total) by mouth daily with supper. 06/01/21  Yes Chandrasekhar, Mahesh A, MD  spironolactone (ALDACTONE) 25 MG tablet Take 0.5 tablets (12.5 mg total) by mouth daily. 06/16/21  Yes Chandrasekhar, Mahesh A, MD  traZODone (DESYREL) 50 MG tablet Take 50 mg by mouth daily as needed for sleep (anxiety).   Yes [provider]  doxazosin (CARDURA) 4 MG tablet Take 1 tablet (4 mg total) by mouth at bedtime. Patient not taking: Reported on 06/21/2021 05/28/21   [name], PA-C    Physical Exam: Vitals:   06/22/21 0900 06/22/21 1051 06/22/21 1055 06/22/21 1130  BP: 115/61 136/78 136/78 136/61  Pulse: 74 (!) 58 (!) 58 78  Resp: 20   20  Temp:      TempSrc:      SpO2: 99%   97%  Weight:      Height:       Physical Exam Vitals and nursing note reviewed.  Constitutional:      General: He is awake. He is not in acute distress.    Appearance: He is obese. He is not ill-appearing.  HENT:     Head:  Normocephalic.     Mouth/Throat:     Mouth: Mucous membranes are moist.  Eyes:     General: No scleral icterus.    Pupils: Pupils are equal, round, and reactive to light.  Neck:     Vascular: No JVD.  Cardiovascular:     Rate and Rhythm: Normal rate and regular rhythm.     Heart sounds: S1 normal and S2 normal.  Pulmonary:     Effort: Pulmonary effort is normal.     Breath sounds: Normal breath sounds. No wheezing, rhonchi or rales.  Abdominal:     General: Bowel sounds are normal. There is no distension.     Palpations: Abdomen is soft.     Tenderness: There is no abdominal tenderness. There is no right CVA tenderness, left CVA tenderness or guarding.  Musculoskeletal:     Cervical back: Neck supple.     Right lower leg: No edema.     Left lower leg: No edema.  Skin:    General:  Skin is warm and dry.  Neurological:     General: No focal deficit present.     Mental Status: He is alert and oriented to person, place, and time.  Psychiatric:        Mood and Affect: Mood normal.        Behavior: Behavior normal.    Data Reviewed:  There are no new results to review at this time.  Assessment and Plan: Principal Problem:   Seizures ([place]) ECG normal. Likely syncopal episode in setting of hyponatremia. Echocardiogram has been ordered as well. We will check carotid Doppler.  Active Problems:   Alcohol abuse Begin CIWA protocol. Thiamine supplementation. Following MVI. Consult TOC. Alcohol cessation advised.    Hyponatremia Furosemide plus beer potomania? Fluid restriction. Continue isotonic IV fluids. Hold furosemide and spironolactone.    Class 1 obesity Lifestyle modification suggested.    Essential hypertension Continue metoprolol 150 mg p.o. daily.    Type 2 diabetes mellitus without complication,  without long-term current use of insulin (HCC) Carbohydrate modified diet.    Chronic obstructive pulmonary disease (HCC) Bronchodilators as needed.     Gastroesophageal reflux disease Begin pantoprazole 40 mg p.o. daily.    Permanent atrial fibrillation (HCC) Continue metoprolol and Xarelto.    OSA (obstructive sleep apnea) Not using CPAP. Declined using CPAP.    Chronic heart failure with preserved ejection fraction (HCC) Check echocardiogram.    Protein-calorie malnutrition, moderate (HCC) In the setting the alcohol abuse. Alcohol cessation advised.    Hypomagnesemia Replacement ordered.     Advance Care Planning:   Code Status: Full Code   Consults:   Family Communication:   Severity of Illness: The appropriate patient status for this patient is INPATIENT. Inpatient status is judged to be reasonable and necessary in order to provide the required intensity of service to ensure the patient's safety. The patient's presenting symptoms, physical exam findings, and initial radiographic and laboratory data in the context of their chronic comorbidities is felt to place them at high risk for further clinical deterioration. Furthermore, it is not anticipated that the patient will be medically stable for discharge from the hospital within 2 midnights of admission.   * I certify that at the point of admission it is my clinical judgment that the patient will require inpatient hospital care spanning beyond 2 midnights from the point of admission due to high intensity of service, high risk for further deterioration and high frequency of surveillance required.*  Author: Reubin Milan, MD 06/22/2021 11:55 AM  For on call review [email or web address].   This document was prepared using Dragon voice recognition software and may contain some unintended transcription errors.

## 2021-06-22 NOTE — ED Notes (Signed)
Lunch provided.

## 2021-06-22 NOTE — ED Notes (Signed)
drinks provided.

## 2021-06-23 ENCOUNTER — Ambulatory Visit (HOSPITAL_COMMUNITY): Payer: 59

## 2021-06-23 ENCOUNTER — Inpatient Hospital Stay (HOSPITAL_COMMUNITY): Payer: 59

## 2021-06-23 ENCOUNTER — Other Ambulatory Visit: Payer: 59

## 2021-06-23 DIAGNOSIS — R4020 Unspecified coma: Secondary | ICD-10-CM

## 2021-06-23 DIAGNOSIS — E119 Type 2 diabetes mellitus without complications: Secondary | ICD-10-CM

## 2021-06-23 DIAGNOSIS — I4821 Permanent atrial fibrillation: Secondary | ICD-10-CM

## 2021-06-23 DIAGNOSIS — R55 Syncope and collapse: Secondary | ICD-10-CM

## 2021-06-23 DIAGNOSIS — I5032 Chronic diastolic (congestive) heart failure: Secondary | ICD-10-CM | POA: Diagnosis not present

## 2021-06-23 DIAGNOSIS — E669 Obesity, unspecified: Secondary | ICD-10-CM

## 2021-06-23 DIAGNOSIS — F1010 Alcohol abuse, uncomplicated: Secondary | ICD-10-CM

## 2021-06-23 DIAGNOSIS — E440 Moderate protein-calorie malnutrition: Secondary | ICD-10-CM

## 2021-06-23 DIAGNOSIS — G4733 Obstructive sleep apnea (adult) (pediatric): Secondary | ICD-10-CM

## 2021-06-23 DIAGNOSIS — E871 Hypo-osmolality and hyponatremia: Secondary | ICD-10-CM

## 2021-06-23 DIAGNOSIS — J189 Pneumonia, unspecified organism: Secondary | ICD-10-CM | POA: Diagnosis not present

## 2021-06-23 DIAGNOSIS — I10 Essential (primary) hypertension: Secondary | ICD-10-CM

## 2021-06-23 DIAGNOSIS — R569 Unspecified convulsions: Secondary | ICD-10-CM | POA: Diagnosis not present

## 2021-06-23 LAB — LEGIONELLA PNEUMOPHILA SEROGP 1 UR AG: L. pneumophila Serogp 1 Ur Ag: NEGATIVE

## 2021-06-23 LAB — COMPREHENSIVE METABOLIC PANEL
ALT: 8 U/L (ref 0–44)
AST: 16 U/L (ref 15–41)
Albumin: 3 g/dL — ABNORMAL LOW (ref 3.5–5.0)
Alkaline Phosphatase: 95 U/L (ref 38–126)
Anion gap: 10 (ref 5–15)
BUN: 18 mg/dL (ref 8–23)
CO2: 22 mmol/L (ref 22–32)
Calcium: 8.3 mg/dL — ABNORMAL LOW (ref 8.9–10.3)
Chloride: 97 mmol/L — ABNORMAL LOW (ref 98–111)
Creatinine, Ser: 0.84 mg/dL (ref 0.61–1.24)
GFR, Estimated: >60 mL/min (ref >60)
Glucose, Bld: 102 mg/dL — ABNORMAL HIGH (ref 70–99)
Potassium: 3.5 mmol/L (ref 3.5–5.1)
Sodium: 129 mmol/L — ABNORMAL LOW (ref 135–145)
Total Bilirubin: 0.9 mg/dL (ref 0.3–1.2)
Total Protein: 7.3 g/dL (ref 6.5–8.1)

## 2021-06-23 LAB — CBC WITH DIFFERENTIAL/PLATELET
Abs Immature Granulocytes: 0.02 10*3/uL (ref 0.00–0.07)
Basophils Absolute: 0.1 10*3/uL (ref 0.0–0.1)
Basophils Relative: 1 %
Eosinophils Absolute: 0.1 10*3/uL (ref 0.0–0.5)
Eosinophils Relative: 1 %
HCT: 37.8 % — ABNORMAL LOW (ref 39.0–52.0)
Hemoglobin: 12.6 g/dL — ABNORMAL LOW (ref 13.0–17.0)
Immature Granulocytes: 0 %
Lymphocytes Relative: 11 %
Lymphs Abs: 0.7 10*3/uL (ref 0.7–4.0)
MCH: 32.6 pg (ref 26.0–34.0)
MCHC: 33.3 g/dL (ref 30.0–36.0)
MCV: 97.7 fL (ref 80.0–100.0)
Monocytes Absolute: 0.5 10*3/uL (ref 0.1–1.0)
Monocytes Relative: 8 %
Neutro Abs: 4.7 10*3/uL (ref 1.7–7.7)
Neutrophils Relative %: 79 %
Platelets: 277 10*3/uL (ref 150–400)
RBC: 3.87 MIL/uL — ABNORMAL LOW (ref 4.22–5.81)
RDW: 13.8 % (ref 11.5–15.5)
WBC: 5.9 10*3/uL (ref 4.0–10.5)
nRBC: 0 % (ref 0.0–0.2)

## 2021-06-23 LAB — MAGNESIUM: Magnesium: 2.1 mg/dL (ref 1.7–2.4)

## 2021-06-23 LAB — PHOSPHORUS: Phosphorus: 2.9 mg/dL (ref 2.5–4.6)

## 2021-06-23 LAB — GLUCOSE, CAPILLARY
Glucose-Capillary: 125 mg/dL — ABNORMAL HIGH (ref 70–99)
Glucose-Capillary: 149 mg/dL — ABNORMAL HIGH (ref 70–99)
Glucose-Capillary: 145 mg/dL — ABNORMAL HIGH (ref 70–99)
Glucose-Capillary: 254 mg/dL — ABNORMAL HIGH (ref 70–99)

## 2021-06-23 LAB — HEMOGLOBIN A1C
Hgb A1c MFr Bld: 7.2 % — ABNORMAL HIGH (ref 4.8–5.6)
Mean Plasma Glucose: 159.94 mg/dL

## 2021-06-23 MED ORDER — IPRATROPIUM-ALBUTEROL 0.5-2.5 (3) MG/3ML IN SOLN
3.0000 mL | Freq: Four times a day (QID) | RESPIRATORY_TRACT | Status: DC
  Administered 2021-06-23 – 2021-06-24 (×3): 3 mL via RESPIRATORY_TRACT
  Filled 2021-06-23 (×3): qty 3

## 2021-06-23 MED ORDER — ORAL CARE MOUTH RINSE
15.0000 mL | Freq: Two times a day (BID) | OROMUCOSAL | Status: DC
  Administered 2021-06-23 – 2021-06-24 (×4): 15 mL via OROMUCOSAL

## 2021-06-23 MED ORDER — PREDNISONE 50 MG PO TABS
50.0000 mg | ORAL_TABLET | Freq: Every day | ORAL | Status: DC
Start: 2021-06-23 — End: 2021-06-25
  Administered 2021-06-23 – 2021-06-25 (×3): 50 mg via ORAL
  Filled 2021-06-23 (×3): qty 1

## 2021-06-23 MED ORDER — INSULIN ASPART 100 UNIT/ML IJ SOLN
5.0000 [IU] | Freq: Once | INTRAMUSCULAR | Status: AC
Start: 2021-06-23 — End: 2021-06-23
  Administered 2021-06-23: 5 [IU] via SUBCUTANEOUS

## 2021-06-23 MED ORDER — SODIUM CHLORIDE 1 G PO TABS
1.0000 g | ORAL_TABLET | Freq: Three times a day (TID) | ORAL | Status: DC
  Administered 2021-06-23 – 2021-06-25 (×5): 1 g via ORAL
  Filled 2021-06-23 (×5): qty 1

## 2021-06-23 NOTE — Progress Notes (Signed)
PROGRESS NOTE    Ronnie Dunn  SVX:[id number] DOB: 08/31/1960 DOA: 06/21/2021 PCP: Mayer Masker, PA-C     Brief Narrative:  61 y.o. WM PMHx Chronic Diastolic CHF, permanent Atrial Fibrillation, COPD, OSA, class I obesity, HTN, GERD,  Hyponatremia, , questionable seizures, thoracic aortic aneurysm without rupture, DM type II, ETOH abuse, Tobacco abuse  Admitted due to loss of consciousness.  He is not a very good historian but his wife stated that he has been complaining of weakness, dizziness and passed out.  He woke up after a minute or so and was confused for a few minutes.  However, the patient's wife stated that he had a similar episode earlier where he was passed out longer than a minute.  The patient stated that he drinks at least 6 cans of beer at least 4 times a week. No chest pain, palpitations, diaphoresis, PND, orthopnea or pitting edema of the lower extremities.  He also smokes cigarettes.He denied fever, chills, rhinorrhea, sore throat, wheezing or hemoptysis.   No abdominal pain, nausea, emesis, diarrhea, constipation, melena or hematochezia.  No flank pain, dysuria, frequency or hematuria.  No polyuria, polydipsia or polyphagia.   ED course: Initial vital signs were temperature 97.5 F, pulse 70, respiration 18, BP 140/67 and O2 sat 88% on room air.  Chest x-ray showed cardiomegaly and central vascular prominence without overt pulmonary edema or focal airspace consolidation.  CT head negative for acute findings but show atherosclerosis.  CTA with patchy nodular airspace opacity especially in the right lower lobe but also posteriorly in the right upper lobe and in the left lower lobe with suspicion for aspiration pneumonitis although multilobar pneumonia could have similar appearance.   Subjective: A/O x4,   Assessment & Plan: Covid vaccination;   Principal Problem:   Seizures (HCC) Active Problems:   Hyponatremia   Class 1 obesity   Essential hypertension   Type 2  diabetes mellitus without complication, without long-term current use of insulin (HCC)   Chronic obstructive pulmonary disease (HCC)   Gastroesophageal reflux disease   Permanent atrial fibrillation (HCC)   OSA (obstructive sleep apnea)   Chronic heart failure with preserved ejection fraction (HCC)   Protein-calorie malnutrition, moderate (HCC)   Hypomagnesemia   Alcohol abuse   Loss of consciousness (HCC)   Obesity (BMI 30-39.9)  LOC -6/7 orthostatic vitals pending -6/7 ambulatory SPO2 pending  Seizures (HCC) -ECG normal. -Most likely multifactorial, Hyponatremia.  A-fib with possible RVR, PNA, OSA patient refuses CPAP -6/7 carotid Doppler pending   Multilobular PNA/CAP -Complete 7 days of antibiotics -Prednisone 50 mg daily  COPD/Tobacco Abuse -6/7 per wife nicotine patches makes patient ill, however patient has decreased from 2 packs/day---> 1 pack/day     OSA (obstructive sleep apnea) Not using CPAP. Declined using CPAP.    EtOH Abuse Begin CIWA protocol. Thiamine supplementation. Following MVI. Consult TOC. Alcohol cessation advised.  Patient not interested in resources   Hyponatremia -Furosemide plus beer potomania? -Normal saline 65ml/hr -Hold furosemide and spironolactone. Lab Results  Component Value Date   NA 129 (L) 06/23/2021   NA 127 (L) 06/22/2021   NA 123 (L) 06/22/2021   NA 123 (L) 06/21/2021   NA 129 (L) 06/10/2021  -6/7 NaCl tabs 1g qac     Class 1 obesity Lifestyle modification suggested.     DM type II uncontrolled without long-term insulin -6/7 hemoglobin A1c= 7.2 -6/7 spoke at length with patient and wife agreed to use insulin while in the hospital acutely.  DO NOT want to start any medication upon discharge.  Will discuss with PCP   Gastroesophageal reflux disease Begin pantoprazole 40 mg p.o. daily.  Chronic diastolic CHF - 6/7 echocardiogram unable to determine diastolic function see results below - Strict in and out - Daily  weight   Permanent atrial fibrillation (HCC) -Continue metoprolol and Xarelto.  Essential HTN -Continue metoprolol 150 mg p.o. daily.   Protein-calorie malnutrition, moderate (HCC) --In the setting the alcohol abuse.Alcohol cessation advised.     Hypomagnesemia Replacement ordered.   Obesity BMI35.38 kg/m. -6/7 patient and wife counseled on changing diet.  Not very receptive    Mobility Assessment (last 72 hours)     Mobility Assessment     Row Name 06/23/21 1047 06/22/21 2003         Does patient have an order for bedrest or is patient medically unstable No - Continue assessment No - Continue assessment      What is the highest level of mobility based on the progressive mobility assessment? Level 5 (Walks with assist in room/hall) - Balance while stepping forward/back and can walk in room with assist - Complete Level 5 (Walks with assist in room/hall) - Balance while stepping forward/back and can walk in room with assist - Complete                      DVT prophylaxis:  Code Status: Full Family Communication: 6/7 wife at bedside for discussion of plan of care all questions answered Status is: Inpatient    Dispo: The patient is from: Home              Anticipated d/c is to: Home              Anticipated d/c date is: 2 days              Patient currently is not medically stable to d/c.      Consultants:    Procedures/Significant Events:  6/5 No filling defect is identified in the pulmonary arterial tree to suggest pulmonary embolus. Reduced sensitivity due to motion artifact. 2. Patchy and nodular airspace opacities especially in the right lower lobe but also posteriorly in the right upper lobe and in the left lower lobe. Appearance suspicious for aspiration pneumonitis although multilobar pneumonia could have a similar appearance. 3. Aortic Atherosclerosis (ICD10-I70.0) and Emphysema (ICD10-J43.9). Coronary atherosclerosis, systemic  atherosclerosis, and mild cardiomegaly.  6/6 Echocardiogram Left Ventricle: LVEF= 60 to 65%. The left ventricle has normal function. left ventricle has no  regional wall motion abnormalities.  -Left ventricular diastolic function could not be evaluated due to atrial fibrillation.  Right Ventricle: The right ventricular size is normal. No increase in  right ventricular wall thickness. Right ventricular systolic function is  normal. Tricuspid regurgitation signal is inadequate for assessing PA  pressure.     I have personally reviewed and interpreted all radiology studies and my findings are as above.  VENTILATOR SETTINGS:    Cultures   Antimicrobials: Anti-infectives (From admission, onward)    Start     Dose/Rate Route Frequency Ordered Stop   06/22/21 2200  azithromycin (ZITHROMAX) 500 mg in sodium chloride 0.9 % 250 mL IVPB        500 mg 250 mL/hr over 60 Minutes Intravenous Every 24 hours 06/22/21 0018     06/22/21 2200  cefTRIAXone (ROCEPHIN) 1 g in sodium chloride 0.9 % 100 mL IVPB        1 g 200  mL/hr over 30 Minutes Intravenous Every 24 hours 06/22/21 0018     06/21/21 2330  cefTRIAXone (ROCEPHIN) 1 g in sodium chloride 0.9 % 100 mL IVPB        1 g 200 mL/hr over 30 Minutes Intravenous  Once 06/21/21 2326 06/22/21 0120   06/21/21 2330  azithromycin (ZITHROMAX) 500 mg in sodium chloride 0.9 % 250 mL IVPB        500 mg 250 mL/hr over 60 Minutes Intravenous  Once 06/21/21 2326 06/22/21 0250         Devices    LINES / TUBES:      Continuous Infusions:  sodium chloride 75 mL/hr at 06/23/21 1412   azithromycin 500 mg (06/22/21 2310)   cefTRIAXone (ROCEPHIN)  IV 1 g (06/22/21 2225)     Objective: Vitals:   06/23/21 1230 06/23/21 1231 06/23/21 1235 06/23/21 1300  BP: (!) 146/90 (!) 151/107 (!) 186/100   Pulse: 72 81 80   Resp:   (!) 22   Temp: 97.9 F (36.6 C) 97.9 F (36.6 C) 97.9 F (36.6 C)   TempSrc: Oral Oral Oral   SpO2: 100% 98% 98% 96%   Weight:      Height:        Intake/Output Summary (Last 24 hours) at 06/23/2021 2025 Last data filed at 06/23/2021 1907 Gross per 24 hour  Intake 3176.89 ml  Output 1125 ml  Net 2051.89 ml   Filed Weights   06/21/21 1649 06/23/21 0500 06/23/21 0557  Weight: 121 kg 125.3 kg 125 kg    Examination:  General: A/O x4, positive acute respiratory distress Eyes: negative scleral hemorrhage, negative anisocoria, negative icterus ENT: Negative Runny nose, negative gingival bleeding, Neck:  Negative scars, masses, torticollis, lymphadenopathy, JVD Lungs: poor air movement, diffuse expiratory wheeze, negative crackles Cardiovascular: Irregularly irregular rhythm and rate without murmur gallop or rub normal S1 and S2 Abdomen: negative abdominal pain, nondistended, positive soft, bowel sounds, no rebound, no ascites, no appreciable mass Extremities: No significant cyanosis, clubbing, or edema bilateral lower extremities Skin: Negative rashes, lesions, ulcers Psychiatric:  Negative depression, negative anxiety, negative fatigue, negative mania  Central nervous system:  Cranial nerves II through XII intact, tongue/uvula midline, all extremities muscle strength 5/5, sensation intact throughout, negative dysarthria, negative expressive aphasia, negative receptive aphasia.  .     Data Reviewed: Care during the described time interval was provided by me .  I have reviewed this patient's available data, including medical history, events of note, physical examination, and all test results as part of my evaluation.  CBC: Recent Labs  Lab 06/21/21 1815 06/22/21 0431 06/23/21 0731  WBC 7.2 7.8 5.9  NEUTROABS 6.1 6.3 4.7  HGB 14.7 13.0 12.6*  HCT 41.3 36.7* 37.8*  MCV 93.4 92.7 97.7  PLT 285 290 [id number]   Basic Metabolic Panel: Recent Labs  Lab 06/21/21 2013 06/22/21 0431 06/22/21 1337 06/23/21 0507  NA 123* 123* 127* 129*  K 3.7 3.7 3.0* 3.5  CL 84* 86* 90* 97*  CO2 25 26 24 22   GLUCOSE  129* 115* 142* 102*  BUN 26* 27* 22 18  CREATININE 1.02 0.99 0.91 0.84  CALCIUM 8.5* 8.0* 8.0* 8.3*  MG 1.5* 1.6*  --  2.1  PHOS  --   --   --  2.9   GFR: Estimated Creatinine Clearance: 129.7 mL/min (by C-G formula based on SCr of 0.84 mg/dL). Liver Function Tests: Recent Labs  Lab 06/21/21 2013 06/22/21 0431 06/23/21 0507  AST 19  15 16  ALT 9 8 8   ALKPHOS 118 94 95  BILITOT 0.8 1.1 0.9  PROT 7.7 6.8 7.3  ALBUMIN 3.3* 2.8* 3.0*   No results for input(s): LIPASE, AMYLASE in the last 168 hours. No results for input(s): AMMONIA in the last 168 hours. Coagulation Profile: No results for input(s): INR, PROTIME in the last 168 hours. Cardiac Enzymes: No results for input(s): CKTOTAL, CKMB, CKMBINDEX, TROPONINI in the last 168 hours. BNP (last 3 results) Recent Labs    06/10/21 0916  PROBNP 1,316*   HbA1C: Recent Labs    06/23/21 0507  HGBA1C 7.2*   CBG: Recent Labs  Lab 06/22/21 2305 06/23/21 0743 06/23/21 1156 06/23/21 1633  GLUCAP 187* 125* 149* 145*   Lipid Profile: No results for input(s): CHOL, HDL, LDLCALC, TRIG, CHOLHDL, LDLDIRECT in the last 72 hours. Thyroid Function Tests: Recent Labs    06/22/21 0501  TSH 1.836   Anemia Panel: No results for input(s): VITAMINB12, FOLATE, FERRITIN, TIBC, IRON, RETICCTPCT in the last 72 hours. Sepsis Labs: No results for input(s): PROCALCITON, LATICACIDVEN in the last 168 hours.  Recent Results (from the past 240 hour(s))  Resp Panel by RT-PCR (Flu A&B, Covid) Anterior Nasal Swab     Status: None   Collection Time: 06/21/21  9:00 PM   Specimen: Anterior Nasal Swab  Result Value Ref Range Status   SARS Coronavirus 2 by RT PCR NEGATIVE NEGATIVE Final    Comment: (NOTE) SARS-CoV-2 target nucleic acids are NOT DETECTED.  The SARS-CoV-2 RNA is generally detectable in upper respiratory specimens during the acute phase of infection. The lowest concentration of SARS-CoV-2 viral copies this assay can detect is 138  copies/mL. A negative result does not preclude SARS-Cov-2 infection and should not be used as the sole basis for treatment or other patient management decisions. A negative result may occur with  improper specimen collection/handling, submission of specimen other than nasopharyngeal swab, presence of viral mutation(s) within the areas targeted by this assay, and inadequate number of viral copies(<138 copies/mL). A negative result must be combined with clinical observations, patient history, and epidemiological information. The expected result is Negative.  Fact Sheet for Patients:  EntrepreneurPulse.com.au  Fact Sheet for Healthcare Providers:  IncredibleEmployment.be  This test is no t yet approved or cleared by the [place] FDA and  has been authorized for detection and/or diagnosis of SARS-CoV-2 by FDA under an Emergency Use Authorization (EUA). This EUA will remain  in effect (meaning this test can be used) for the duration of the COVID-19 declaration under Section 564(b)(1) of the Act, 21 U.S.C.section 360bbb-3(b)(1), unless the authorization is terminated  or revoked sooner.       Influenza A by PCR NEGATIVE NEGATIVE Final   Influenza B by PCR NEGATIVE NEGATIVE Final    Comment: (NOTE) The Xpert Xpress SARS-CoV-2/FLU/RSV plus assay is intended as an aid in the diagnosis of influenza from Nasopharyngeal swab specimens and should not be used as a sole basis for treatment. Nasal washings and aspirates are unacceptable for Xpert Xpress SARS-CoV-2/FLU/RSV testing.  Fact Sheet for Patients: EntrepreneurPulse.com.au  Fact Sheet for Healthcare Providers: IncredibleEmployment.be  This test is not yet approved or cleared by the [place] FDA and has been authorized for detection and/or diagnosis of SARS-CoV-2 by FDA under an Emergency Use Authorization (EUA). This EUA will remain in effect (meaning  this test can be used) for the duration of the COVID-19 declaration under Section 564(b)(1) of the Act, 21 U.S.C. section 360bbb-3(b)(1), unless  the authorization is terminated or revoked.  Performed at [facility], [place] [address] [place], [place] [address]   Expectorated Sputum Assessment w Gram Stain, Rflx to Resp Cult     Status: None   Collection Time: 06/22/21  9:32 AM   Specimen: Sputum  Result Value Ref Range Status   Specimen Description SPUTUM  Final   Special Requests NONE  Final   Sputum evaluation   Final    Sputum specimen not acceptable for testing.  Please recollect.   Performed at [facility], [place] [address], [place], [place] [address]    Report Status 06/22/2021 FINAL  Final         Radiology Studies: CT Head Wo Contrast  Result Date: 06/21/2021 CLINICAL DATA:  Syncopal episode for 30 seconds with bowel incontinence, nausea and dizziness, weakness. EXAM: CT HEAD WITHOUT CONTRAST TECHNIQUE: Contiguous axial images were obtained from the base of the skull through the vertex without intravenous contrast. RADIATION DOSE REDUCTION: This exam was performed according to the departmental dose-optimization program which includes automated exposure control, adjustment of the mA and/or kV according to patient size and/or use of iterative reconstruction technique. COMPARISON:  MRI brain 03/29/2020 and CT head 03/28/2020 FINDINGS: Brain: The brainstem, cerebellum, cerebral peduncles, thalami, basal ganglia, basilar cisterns, and ventricular system appear within normal limits. No intracranial hemorrhage, mass lesion, or acute CVA. Vascular: There is atherosclerotic calcification of the cavernous carotid arteries bilaterally. Skull: Unremarkable Sinuses/Orbits: Unremarkable Other: No supplemental non-categorized findings. IMPRESSION: 1. No acute intracranial findings. 2. Atherosclerosis. Electronically Signed   By: Van Clines M.D.   On:  06/21/2021 21:48   CT Angio Chest PE W/Cm &/Or Wo Cm  Result Date: 06/21/2021 CLINICAL DATA:  Weakness, dizziness, nausea, syncope with bowel incontinence. Hypoxia. EXAM: CT ANGIOGRAPHY CHEST WITH CONTRAST TECHNIQUE: Multidetector CT imaging of the chest was performed using the standard protocol during bolus administration of intravenous contrast. Multiplanar CT image reconstructions and MIPs were obtained to evaluate the vascular anatomy. RADIATION DOSE REDUCTION: This exam was performed according to the departmental dose-optimization program which includes automated exposure control, adjustment of the mA and/or kV according to patient size and/or use of iterative reconstruction technique. CONTRAST:  132mL OMNIPAQUE IOHEXOL 350 MG/ML SOLN COMPARISON:  Chest radiograph 06/21/2021 FINDINGS: Despite efforts by the technologist and patient, motion artifact is present on today's exam and could not be eliminated. This reduces exam sensitivity and specificity. Cardiovascular: No filling defect is identified in the pulmonary arterial tree to suggest pulmonary embolus. Coronary, aortic arch, and branch vessel atherosclerotic vascular disease. Mild cardiomegaly. Mediastinum/Nodes: Scattered small mediastinal lymph nodes are not pathologically enlarged by size criteria. Lungs/Pleura: Mild paraseptal emphysema the lung apices. Patchy and nodular airspace opacities posteriorly in the right upper lobe and in the right lower lobe. Similar indistinct nodular opacities in the left lower lobe. Appearance suspicious for aspiration pneumonitis although multilobar pneumonia could have a similar appearance. Upper Abdomen: Abdominal aortic atherosclerosis. Atherosclerotic calcification in the origins of the celiac trunk and SMA. Musculoskeletal: Thoracic spondylosis. Degenerative spurring at the sternoclavicular joints. Mild deformity of the left anterior fourth and fifth ribs likely from old healed fractures. Review of the MIP images  confirms the above findings. IMPRESSION: 1. No filling defect is identified in the pulmonary arterial tree to suggest pulmonary embolus. Reduced sensitivity due to motion artifact. 2. Patchy and nodular airspace opacities especially in the right lower lobe but also posteriorly in the right upper lobe and in the left lower  lobe. Appearance suspicious for aspiration pneumonitis although multilobar pneumonia could have a similar appearance. 3. Aortic Atherosclerosis (ICD10-I70.0) and Emphysema (ICD10-J43.9). Coronary atherosclerosis, systemic atherosclerosis, and mild cardiomegaly. Electronically Signed   By: Van Clines M.D.   On: 06/21/2021 21:56   EEG adult  Result Date: 06/22/2021 Lora Havens, MD     06/22/2021  5:50 PM Patient Name: COLLEEN LAMUNYON MRN: [id number] Epilepsy Attending: Lora Havens Referring Physician/Provider: Reubin Milan, MD Date: 06/22/2021 Duration: 22.20 mins Patient history: 61yo M patient had an episode observed by his wife earlier in the day in which he demonstrated evidence of tonic-clonic activity in bilateral hands, which lasted for approximately 30 seconds before spontaneously abating, without any subsequent recurrence of tonic-clonic activity.  Following the resolution of this episode, there is reportedly some evidence of confusion, that has subsequently resolved, with patient now back to baseline mental status. EEG to evaluate for seizure Level of alertness: Awake AEDs during EEG study: Ativan Technical aspects: This EEG study was done with scalp electrodes positioned according to the 10-20 International system of electrode placement. Electrical activity was acquired at a sampling rate of 500Hz  and reviewed with a high frequency filter of 70Hz  and a low frequency filter of 1Hz . EEG data were recorded continuously and digitally stored. Description: The posterior dominant rhythm consists of 8 Hz activity of moderate voltage (25-35 uV) seen predominantly in posterior  head regions, symmetric and reactive to eye opening and eye closing. Hyperventilation and photic stimulation were not performed.   IMPRESSION: This study is within normal limits. No seizures or epileptiform discharges were seen throughout the recording. Lora Havens   ECHOCARDIOGRAM COMPLETE  Result Date: 06/22/2021    ECHOCARDIOGRAM REPORT   Patient Name:   ANTOWAN VINYARD Date of Exam: 06/22/2021 Medical Rec #:  [id number]     Height:       74.0 in Accession #:    [id number]    Weight:       266.8 lb Date of Birth:  Jan 13, 1961     BSA:          2.456 m Patient Age:    45 years      BP:           126/66 mmHg Patient Gender: M             HR:           88 bpm. Exam Location:  Inpatient Procedure: 2D Echo, Cardiac Doppler and Color Doppler Indications:    Afib  History:        Patient has prior history of Echocardiogram examinations, most                 recent 03/29/2020. CHF, COPD; Risk Factors:Hypertension and                 Diabetes.  Sonographer:    Joette Catching RCS Referring Phys: [id number] [place]  Sonographer Comments: Patient is morbidly obese. IMPRESSIONS  1. Left ventricular ejection fraction, by estimation, is 60 to 65%. The left ventricle has normal function. The left ventricle has no regional wall motion abnormalities. There is mild concentric left ventricular hypertrophy. Left ventricular diastolic function could not be evaluated.  2. Right ventricular systolic function is normal. The right ventricular size is normal. Tricuspid regurgitation signal is inadequate for assessing PA pressure.  3. The mitral valve is grossly normal. Trivial mitral valve regurgitation. No evidence of mitral stenosis.  4. The aortic  valve is tricuspid. Aortic valve regurgitation is not visualized. No aortic stenosis is present.  5. Aortic dilatation noted. There is mild dilatation of the aortic root, measuring 40 mm. There is mild dilatation of the ascending aorta, measuring 42 mm.  6. The inferior vena cava is  dilated in size with >50% respiratory variability, suggesting right atrial pressure of 8 mmHg. FINDINGS  Left Ventricle: Left ventricular ejection fraction, by estimation, is 60 to 65%. The left ventricle has normal function. The left ventricle has no regional wall motion abnormalities. The left ventricular internal cavity size was normal in size. There is  mild concentric left ventricular hypertrophy. Left ventricular diastolic function could not be evaluated due to atrial fibrillation. Left ventricular diastolic function could not be evaluated. Right Ventricle: The right ventricular size is normal. No increase in right ventricular wall thickness. Right ventricular systolic function is normal. Tricuspid regurgitation signal is inadequate for assessing PA pressure. Left Atrium: Left atrial size was normal in size. Right Atrium: Right atrial size was normal in size. Pericardium: There is no evidence of pericardial effusion. Presence of epicardial fat layer. Mitral Valve: The mitral valve is grossly normal. Trivial mitral valve regurgitation. No evidence of mitral valve stenosis. Tricuspid Valve: The tricuspid valve is grossly normal. Tricuspid valve regurgitation is trivial. No evidence of tricuspid stenosis. Aortic Valve: The aortic valve is tricuspid. Aortic valve regurgitation is not visualized. No aortic stenosis is present. Aortic valve mean gradient measures 5.0 mmHg. Aortic valve peak gradient measures 9.1 mmHg. Aortic valve area, by VTI measures 2.69 cm. Pulmonic Valve: The pulmonic valve was grossly normal. Pulmonic valve regurgitation is not visualized. No evidence of pulmonic stenosis. Aorta: Aortic dilatation noted. There is mild dilatation of the aortic root, measuring 40 mm. There is mild dilatation of the ascending aorta, measuring 42 mm. Venous: The inferior vena cava is dilated in size with greater than 50% respiratory variability, suggesting right atrial pressure of 8 mmHg. IAS/Shunts: The atrial  septum is grossly normal.  LEFT VENTRICLE PLAX 2D LVIDd:         4.80 cm   Diastology LVIDs:         3.10 cm   LV e' medial:   9.57 cm/s LV PW:         1.50 cm   LV E/e' medial: 11.8 LV IVS:        1.40 cm LVOT diam:     2.20 cm LV SV:         71 LV SV Index:   29 LVOT Area:     3.80 cm  RIGHT VENTRICLE             IVC RV Basal diam:  3.60 cm     IVC diam: 2.10 cm RV Mid diam:    2.00 cm RV S prime:     14.80 cm/s TAPSE (M-mode): 2.5 cm LEFT ATRIUM             Index        RIGHT ATRIUM           Index LA diam:        3.60 cm 1.47 cm/m   RA Area:     21.80 cm LA Vol (A2C):   99.0 ml 40.30 ml/m  RA Volume:   64.50 ml  26.26 ml/m LA Vol (A4C):   54.6 ml 22.23 ml/m LA Biplane Vol: 74.6 ml 30.37 ml/m  AORTIC VALVE AV Area (Vmax):    2.45 cm AV Area (  Vmean):   2.54 cm AV Area (VTI):     2.69 cm AV Vmax:           151.00 cm/s AV Vmean:          109.000 cm/s AV VTI:            0.263 m AV Peak Grad:      9.1 mmHg AV Mean Grad:      5.0 mmHg LVOT Vmax:         97.50 cm/s LVOT Vmean:        72.700 cm/s LVOT VTI:          0.186 m LVOT/AV VTI ratio: 0.71  AORTA Ao Root diam: 4.00 cm Ao Asc diam:  4.20 cm MITRAL VALVE MV Area (PHT): 5.54 cm     SHUNTS MV Decel Time: 137 msec     Systemic VTI:  0.19 m MV E velocity: 113.00 cm/s  Systemic Diam: 2.20 cm Eleonore Chiquito MD Electronically signed by Eleonore Chiquito MD Signature Date/Time: 06/22/2021/3:45:57 PM    Final    VAS US CAROTID  Result Date: 06/23/2021 Carotid Arterial Duplex Study Patient Name:  MISSAEL ANDRE  Date of Exam:   06/23/2021 Medical Rec #: [id number]      Accession #:    [id number] Date of Birth: [birth year]      Patient Gender: M Patient Age:   66 years Exam Location:  [facility] Procedure:      VAS US CAROTID Referring Phys: DAVID ORTIZ --------------------------------------------------------------------------------  Indications:       Syncope. Risk Factors:      Hypertension, Diabetes. Limitations        Today's exam was limited due to the body  habitus of the                    patient, the high bifurcation of the carotid and patient                    positioning. Comparison Study:  No prior studies. Performing Technologist: Oliver Hum RVT  Examination Guidelines: A complete evaluation includes B-mode imaging, spectral Doppler, color Doppler, and power Doppler as needed of all accessible portions of each vessel. Bilateral testing is considered an integral part of a complete examination. Limited examinations for reoccurring indications may be performed as noted.  Right Carotid Findings: +----------+--------+--------+--------+-----------------------+--------+           PSV cm/sEDV cm/sStenosisPlaque Description     Comments +----------+--------+--------+--------+-----------------------+--------+ CCA Prox  61      9               smooth and heterogenous         +----------+--------+--------+--------+-----------------------+--------+ CCA Distal57      7               smooth and heterogenous         +----------+--------+--------+--------+-----------------------+--------+ ICA Prox  24      9               smooth and heterogenous         +----------+--------+--------+--------+-----------------------+--------+ ICA Distal39      11                                     tortuous +----------+--------+--------+--------+-----------------------+--------+ ECA       140     16                                              +----------+--------+--------+--------+-----------------------+--------+ +----------+--------+-------+--------+-------------------+  PSV cm/sEDV cmsDescribeArm Pressure (mmHG) +----------+--------+-------+--------+-------------------+ Subclavian207                                        +----------+--------+-------+--------+-------------------+ +---------+--------+--+--------+-+---------+ VertebralPSV cm/s24EDV cm/s9Antegrade +---------+--------+--+--------+-+---------+  Left  Carotid Findings: +----------+--------+--------+--------+-----------------------+--------+           PSV cm/sEDV cm/sStenosisPlaque Description     Comments +----------+--------+--------+--------+-----------------------+--------+ CCA Prox  67      14              smooth and heterogenous         +----------+--------+--------+--------+-----------------------+--------+ CCA Distal58      10              smooth and heterogenous         +----------+--------+--------+--------+-----------------------+--------+ ICA Prox  70      22              smooth and heterogenous         +----------+--------+--------+--------+-----------------------+--------+ ICA Distal68      17                                     tortuous +----------+--------+--------+--------+-----------------------+--------+ ECA       177     19                                              +----------+--------+--------+--------+-----------------------+--------+ +----------+--------+--------+--------+-------------------+           PSV cm/sEDV cm/sDescribeArm Pressure (mmHG) +----------+--------+--------+--------+-------------------+ [id number]                                         +----------+--------+--------+--------+-------------------+ +---------+--------+--+--------+--+---------+ VertebralPSV cm/s34EDV cm/s11Antegrade +---------+--------+--+--------+--+---------+   Summary: Right Carotid: Velocities in the right ICA are consistent with a 1-39% stenosis. Left Carotid: Velocities in the left ICA are consistent with a 1-39% stenosis. Vertebrals: Bilateral vertebral arteries demonstrate antegrade flow. *See table(s) above for measurements and observations.     Preliminary         Scheduled Meds:  atorvastatin  10 mg Oral Daily   dapagliflozin propanediol  10 mg Oral Daily   famotidine  40 mg Oral Daily   fluticasone furoate-vilanterol  1 puff Inhalation Daily   folic acid  1 mg Oral Daily    insulin aspart  0-9 Units Subcutaneous TID WC   ipratropium-albuterol  3 mL Nebulization QID   loratadine  10 mg Oral Daily   LORazepam  0-4 mg Oral Q4H   Followed by   Derrill Memo ON 06/24/2021] LORazepam  0-4 mg Oral Q8H   magnesium oxide  400 mg Oral Daily   mouth rinse  15 mL Mouth Rinse BID   metoprolol succinate  150 mg Oral Daily   multivitamin with minerals  1 tablet Oral Daily   pantoprazole  40 mg Oral Daily   predniSONE  50 mg Oral Q breakfast   rivaroxaban  20 mg Oral Q supper   sodium chloride  1 g Oral TID WC   thiamine  100 mg Oral Daily   Or   thiamine  100 mg Intravenous Daily   Continuous Infusions:  sodium chloride  75 mL/hr at 06/23/21 1412   azithromycin 500 mg (06/22/21 2310)   cefTRIAXone (ROCEPHIN)  IV 1 g (06/22/21 2225)     LOS: 1 day    Time spent:40 min    Jesselee Poth, Geraldo Docker, MD [facility]   If 7PM-7AM, please contact night-coverage 06/23/2021, 8:25 PM

## 2021-06-23 NOTE — Progress Notes (Signed)
SATURATION QUALIFICATIONS: (This note is used to comply with regulatory documentation for home oxygen)  Patient Saturations on Room Air at Rest = 95%  Patient Saturations on Room Air while Ambulating = >/=91%   

## 2021-06-23 NOTE — Plan of Care (Signed)
Pt's O2 weaned to 3L this shift. Problem: Coping: Goal: Ability to adjust to condition or change in health will improve Outcome: Progressing   Problem: Fluid Volume: Goal: Ability to maintain a balanced intake and output will improve Outcome: Progressing   Problem: Health Behavior/Discharge Planning: Goal: Ability to identify and utilize available resources and services will improve Outcome: Progressing Goal: Ability to manage health-related needs will improve Outcome: Progressing   Problem: Metabolic: Goal: Ability to maintain appropriate glucose levels will improve Outcome: Progressing   Problem: Nutritional: Goal: Maintenance of adequate nutrition will improve Outcome: Progressing   Problem: Skin Integrity: Goal: Risk for impaired skin integrity will decrease Outcome: Progressing   Problem: Tissue Perfusion: Goal: Adequacy of tissue perfusion will improve Outcome: Progressing   Problem: Education: Goal: Knowledge of General Education information will improve Description: Including pain rating scale, medication(s)/side effects and non-pharmacologic comfort measures Outcome: Progressing   Problem: Health Behavior/Discharge Planning: Goal: Ability to manage health-related needs will improve Outcome: Progressing   Problem: Clinical Measurements: Goal: Ability to maintain clinical measurements within normal limits will improve Outcome: Progressing Goal: Will remain free from infection Outcome: Progressing Goal: Diagnostic test results will improve Outcome: Progressing Goal: Respiratory complications will improve Outcome: Progressing Goal: Cardiovascular complication will be avoided Outcome: Progressing   Problem: Activity: Goal: Risk for activity intolerance will decrease Outcome: Progressing   Problem: Nutrition: Goal: Adequate nutrition will be maintained Outcome: Progressing   Problem: Coping: Goal: Level of anxiety will decrease Outcome: Progressing    Problem: Elimination: Goal: Will not experience complications related to bowel motility Outcome: Progressing Goal: Will not experience complications related to urinary retention Outcome: Progressing   Problem: Pain Managment: Goal: General experience of comfort will improve Outcome: Progressing   Problem: Safety: Goal: Ability to remain free from injury will improve Outcome: Progressing   Problem: Skin Integrity: Goal: Risk for impaired skin integrity will decrease Outcome: Progressing

## 2021-06-23 NOTE — TOC Transition Note (Signed)
Transition of Care [facility]) - CM/SW Discharge Note   Patient Details  Name: Ronnie Dunn MRN: [id number] Date of Birth: 06-25-1960  [facility] CM/SW Contact:  Vassie Moselle, LCSW Phone Number: 06/23/2021, 11:48 AM   Clinical Narrative:    TOC consulted to SA education/resources. Met with pt and his spouse, Suanne Marker to discuss pt's ETOH use. Pt states he does not believe his ETOH use has impacted him with his daily functioning however, did admit to increased medical issues due to his alcohol and nicotine use. Pt states that he has stopped drinking in the past for around a year due to medical concerns. He has already began reducing his alcohol and nicotine on his own. He states he used to smoke over 2 packs a day and now smokes less than a pack of cigarettes a day. He reports drinking between 6-8 beers a day but, both he and wife state he does not drink on a daily basis. Pt states that when he wants to stop drinking he is able to do this on his own and refuses information for AA meetings or SA counseling. Wife confirms when pt is determined he is able to stop drinking/smoking on his own accord. No further TOC needs identified at this time.    Final next level of care: Home/Self Care Barriers to Discharge: No Barriers Identified   Patient Goals and CMS Choice Patient states their goals for this hospitalization and ongoing recovery are:: Return home      Discharge Placement                       Discharge Plan and Services                DME Arranged: N/A                    Social Determinants of Health (SDOH) Interventions     Readmission Risk Interventions    06/23/2021   11:48 AM  Readmission Risk Prevention Plan  Transportation Screening Complete  PCP or Specialist Appt within 5-7 Days Complete  Home Care Screening Complete  Medication Review (RN CM) Complete

## 2021-06-23 NOTE — Plan of Care (Signed)

## 2021-06-23 NOTE — Progress Notes (Signed)
Carotid artery duplex has been completed. Preliminary results can be found in CV Proc through chart review.   06/23/21 9:49 AM Olen Cordial RVT

## 2021-06-24 DIAGNOSIS — J189 Pneumonia, unspecified organism: Secondary | ICD-10-CM | POA: Diagnosis not present

## 2021-06-24 DIAGNOSIS — Z91148 Patient's other noncompliance with medication regimen for other reason: Secondary | ICD-10-CM

## 2021-06-24 DIAGNOSIS — F1010 Alcohol abuse, uncomplicated: Secondary | ICD-10-CM | POA: Diagnosis not present

## 2021-06-24 DIAGNOSIS — R569 Unspecified convulsions: Secondary | ICD-10-CM | POA: Diagnosis not present

## 2021-06-24 DIAGNOSIS — I5032 Chronic diastolic (congestive) heart failure: Secondary | ICD-10-CM | POA: Diagnosis not present

## 2021-06-24 LAB — CBC WITH DIFFERENTIAL/PLATELET
Abs Immature Granulocytes: 0.04 10*3/uL (ref 0.00–0.07)
Basophils Absolute: 0 10*3/uL (ref 0.0–0.1)
Basophils Relative: 1 %
Eosinophils Absolute: 0 10*3/uL (ref 0.0–0.5)
Eosinophils Relative: 0 %
HCT: 37.9 % — ABNORMAL LOW (ref 39.0–52.0)
Hemoglobin: 12.5 g/dL — ABNORMAL LOW (ref 13.0–17.0)
Immature Granulocytes: 1 %
Lymphocytes Relative: 15 %
Lymphs Abs: 1 10*3/uL (ref 0.7–4.0)
MCH: 33.2 pg (ref 26.0–34.0)
MCHC: 33 g/dL (ref 30.0–36.0)
MCV: 100.5 fL — ABNORMAL HIGH (ref 80.0–100.0)
Monocytes Absolute: 0.6 10*3/uL (ref 0.1–1.0)
Monocytes Relative: 9 %
Neutro Abs: 5 10*3/uL (ref 1.7–7.7)
Neutrophils Relative %: 74 %
Platelets: 317 10*3/uL (ref 150–400)
RBC: 3.77 MIL/uL — ABNORMAL LOW (ref 4.22–5.81)
RDW: 13.8 % (ref 11.5–15.5)
WBC: 6.6 10*3/uL (ref 4.0–10.5)
nRBC: 0 % (ref 0.0–0.2)

## 2021-06-24 LAB — COMPREHENSIVE METABOLIC PANEL
ALT: 9 U/L (ref 0–44)
AST: 15 U/L (ref 15–41)
Albumin: 3 g/dL — ABNORMAL LOW (ref 3.5–5.0)
Alkaline Phosphatase: 107 U/L (ref 38–126)
Anion gap: 10 (ref 5–15)
BUN: 15 mg/dL (ref 8–23)
CO2: 20 mmol/L — ABNORMAL LOW (ref 22–32)
Calcium: 8.8 mg/dL — ABNORMAL LOW (ref 8.9–10.3)
Chloride: 103 mmol/L (ref 98–111)
Creatinine, Ser: 0.77 mg/dL (ref 0.61–1.24)
GFR, Estimated: >60 mL/min (ref >60)
Glucose, Bld: 118 mg/dL — ABNORMAL HIGH (ref 70–99)
Potassium: 3.9 mmol/L (ref 3.5–5.1)
Sodium: 133 mmol/L — ABNORMAL LOW (ref 135–145)
Total Bilirubin: 0.7 mg/dL (ref 0.3–1.2)
Total Protein: 7.9 g/dL (ref 6.5–8.1)

## 2021-06-24 LAB — GLUCOSE, CAPILLARY
Glucose-Capillary: 261 mg/dL — ABNORMAL HIGH (ref 70–99)
Glucose-Capillary: 153 mg/dL — ABNORMAL HIGH (ref 70–99)
Glucose-Capillary: 138 mg/dL — ABNORMAL HIGH (ref 70–99)
Glucose-Capillary: 188 mg/dL — ABNORMAL HIGH (ref 70–99)

## 2021-06-24 LAB — MAGNESIUM: Magnesium: 2.2 mg/dL (ref 1.7–2.4)

## 2021-06-24 LAB — PHOSPHORUS: Phosphorus: 3.2 mg/dL (ref 2.5–4.6)

## 2021-06-24 MED ORDER — HYDRALAZINE HCL 50 MG PO TABS
50.0000 mg | ORAL_TABLET | Freq: Three times a day (TID) | ORAL | Status: DC
  Administered 2021-06-24 – 2021-06-25 (×2): 50 mg via ORAL
  Filled 2021-06-24 (×3): qty 1

## 2021-06-24 MED ORDER — LISINOPRIL 5 MG PO TABS: 2.5000 mg | ORAL_TABLET | Freq: Every day | ORAL | Status: DC

## 2021-06-24 MED ORDER — HYDRALAZINE HCL 25 MG PO TABS
25.0000 mg | ORAL_TABLET | Freq: Three times a day (TID) | ORAL | Status: DC
  Administered 2021-06-24: 25 mg via ORAL
  Filled 2021-06-24: qty 1

## 2021-06-24 MED ORDER — DOXAZOSIN MESYLATE 4 MG PO TABS
4.0000 mg | ORAL_TABLET | Freq: Every day | ORAL | Status: DC
  Administered 2021-06-24: 4 mg via ORAL
  Filled 2021-06-24: qty 1

## 2021-06-24 MED ORDER — LISINOPRIL 5 MG PO TABS
5.0000 mg | ORAL_TABLET | Freq: Every day | ORAL | Status: DC
  Administered 2021-06-24 – 2021-06-25 (×2): 5 mg via ORAL
  Filled 2021-06-24 (×2): qty 1

## 2021-06-24 MED ORDER — SPIRONOLACTONE 12.5 MG HALF TABLET
12.5000 mg | ORAL_TABLET | Freq: Every day | ORAL | Status: DC
  Administered 2021-06-24: 12.5 mg via ORAL
  Filled 2021-06-24 (×2): qty 1

## 2021-06-24 MED ORDER — CEFDINIR 300 MG PO CAPS
300.0000 mg | ORAL_CAPSULE | Freq: Two times a day (BID) | ORAL | Status: DC
  Administered 2021-06-24 (×2): 300 mg via ORAL
  Filled 2021-06-24 (×3): qty 1

## 2021-06-24 MED ORDER — AZITHROMYCIN 250 MG PO TABS
500.0000 mg | ORAL_TABLET | Freq: Every day | ORAL | Status: DC
Start: 2021-06-24 — End: 2021-06-25
  Administered 2021-06-24: 500 mg via ORAL
  Filled 2021-06-24: qty 2

## 2021-06-24 MED ORDER — IPRATROPIUM-ALBUTEROL 0.5-2.5 (3) MG/3ML IN SOLN
3.0000 mL | Freq: Four times a day (QID) | RESPIRATORY_TRACT | Status: DC
  Administered 2021-06-24 – 2021-06-25 (×3): 3 mL via RESPIRATORY_TRACT
  Filled 2021-06-24 (×4): qty 3

## 2021-06-24 NOTE — Progress Notes (Signed)
Pt bp 172/87, Garner Nash NP notified. NP stated he would not like to order anything additional at this time. RN let pt know nothing additional would be ordered for his BP at this time. Pt became upset stating he needed something for his BP. RN reminded pt of his BP meds scheduled for this am.  Pts spouse approached RN stating pt felt "funny." Pt unable to describe "funny" feeling. Pt denies pain, dizziness, or lightheadedness. No change in assessment, aox4, pulses +2. BP now 170/81.  Garner Nash NP notified of pts c/o feeling funny and elevated BP. NP stated he would take a look & possibly reach out to attending.  RN let pt and spouse know the plan. Pt now states he no longer is feeling "funny" but he is ready to be discharged. Will continue to monitor.

## 2021-06-24 NOTE — Progress Notes (Signed)
[name]  [name] DOB: [birth year] DOA: 06/21/2021 PCP: [name], PA-C     Brief Narrative:  61 y.o. WM PMHx Chronic Diastolic CHF, permanent Atrial Fibrillation, COPD, OSA, class I obesity, HTN, GERD,  Hyponatremia, , questionable seizures, thoracic aortic aneurysm without rupture, DM type II, ETOH abuse, Tobacco abuse  Admitted due to loss of consciousness.  He is not a very good historian but his wife stated that he has been complaining of weakness, dizziness and passed out.  He woke up after a minute or so and was confused for a few minutes.  However, the patient's wife stated that he had a similar episode earlier where he was passed out longer than a minute.  The patient stated that he drinks at least 6 cans of beer at least 4 times a week. No chest pain, palpitations, diaphoresis, PND, orthopnea or pitting edema of the lower extremities.  He also smokes cigarettes.He denied fever, chills, rhinorrhea, sore throat, wheezing or hemoptysis.   No abdominal pain, nausea, emesis, diarrhea, constipation, melena or hematochezia.  No flank pain, dysuria, frequency or hematuria.  No polyuria, polydipsia or polyphagia.   ED course: Initial vital signs were temperature 97.5 F, pulse 70, respiration 18, BP 140/67 and O2 sat 88% on room air.  Chest x-ray showed cardiomegaly and central vascular prominence without overt pulmonary edema or focal airspace consolidation.  CT head negative for acute findings but show atherosclerosis.  CTA with patchy nodular airspace opacity especially in the right lower lobe but also posteriorly in the right upper lobe and in the left lower lobe with suspicion for aspiration pneumonitis although multilobar pneumonia could have similar appearance.   Subjective: 6/8 A/O x4, patient has been intermittently noncompliant with medication, which has resulted in increased BP, increased CBG.   Assessment & Plan: Covid vaccination;   Principal  Problem:   Seizures ([name]) Active Problems:   Hyponatremia   Class 1 obesity   Essential hypertension   Type 2 diabetes mellitus without complication, without long-term current use of insulin (HCC)   Chronic obstructive pulmonary disease (HCC)   Gastroesophageal reflux disease   Permanent atrial fibrillation (HCC)   OSA (obstructive sleep apnea)   Chronic heart failure with preserved ejection fraction (HCC)   Protein-calorie malnutrition, moderate (HCC)   Hypomagnesemia   Alcohol abuse   Loss of consciousness (HCC)   Obesity (BMI 30-39.9)   Noncompliance with medication regimen  LOC -6/7 orthostatic vitals: Patient not orthostatic -6/7 ambulatory SPO2; did not meet criteria for home O2    Seizures (HCC) -ECG normal. -Most likely multifactorial, Hyponatremia.  A-fib with possible RVR, PNA, OSA patient refuses CPAP -6/7 carotid Doppler WNL see results below   Multilobular PNA/CAP -Complete 7 days of antibiotics -Prednisone 50 mg daily x 5 days -Flutter valve - Incentive spirometry - Breo Ellipta 100-25 1 puff daily - Combivent QID   COPD/Tobacco Abuse -6/7 per wife nicotine patches makes patient ill, however patient has decreased from 2 packs/day---> 1 pack/day   OSA (obstructive sleep apnea) -Declined using CPAP.   EtOH Abuse -Begin CIWA protocol. -Consult TOC. -Alcohol cessation advised.  Patient not interested in resources   Hyponatremia/Beer Potomania -Resolved Lab Results  Component Value Date   NA 133 (L) 06/24/2021   NA 129 (L) 06/23/2021   NA 127 (L) 06/22/2021   NA 123 (L) 06/22/2021   NA 123 (L) 06/21/2021  -Counseled to discontinue drinking -NaCl tablets 1 g TID x1 week then discontinue -  Follow-up with PCP hyponatremia, beer Potomania, pneumonia   Essential HTN - Doxazosin 4 mg daily -Hydralazine 50 mg TID -Lisinopril 5 mg daily -Toprol 150 mg daily - Spironolactone 12.5 mg daily  Chronic diastolic CHF - 6/7 echocardiogram unable to  determine diastolic function see results below - Strict in and out - Daily weight -See HTN   Permanent atrial fibrillation (HCC) -Continue metoprolol and Xarelto.   Noncompliance medication - Patient noncompliant with medication at home, which is part of his problem.  Patient intermittently noncompliant with medication here in the hospital.   DM type II uncontrolled without long-term insulin -6/7 hemoglobin A1c= 7.2 -6/7 spoke at length with patient and wife agreed to use insulin while in the hospital acutely.  DO NOT want to start any medication upon discharge.  Will discuss with PCP   Gastroesophageal reflux disease Begin pantoprazole 40 mg p.o. daily.  Protein-calorie malnutrition, moderate ([place]) --In the setting the alcohol abuse.Alcohol cessation advised.   Hypomagnesemia -Magnesium goal> 2   Obesity BMI35.38 kg/m. -6/7 patient and wife counseled on changing diet.  Not very receptive    Mobility Assessment (last 72 hours)     Mobility Assessment     Row Name 06/23/21 2031 06/23/21 1047 06/22/21 2003       Does patient have an order for bedrest or is patient medically unstable No - Continue assessment No - Continue assessment No - Continue assessment     What is the highest level of mobility based on the progressive mobility assessment? Level 5 (Walks with assist in room/hall) - Balance while stepping forward/back and can walk in room with assist - Complete Level 5 (Walks with assist in room/hall) - Balance while stepping forward/back and can walk in room with assist - Complete Level 5 (Walks with assist in room/hall) - Balance while stepping forward/back and can walk in room with assist - Complete                     DVT prophylaxis:  Code Status: Full Family Communication: 6/7 wife at bedside for discussion of plan of care all questions answered Status is: Inpatient    Dispo: The patient is from: Home              Anticipated d/c is to: Home               Anticipated d/c date is: 2 days              Patient currently is not medically stable to d/c.      Consultants:    Procedures/Significant Events:  6/5 No filling defect is identified in the pulmonary arterial tree to suggest pulmonary embolus. Reduced sensitivity due to motion artifact. 2. Patchy and nodular airspace opacities especially in the right lower lobe but also posteriorly in the right upper lobe and in the left lower lobe. Appearance suspicious for aspiration pneumonitis although multilobar pneumonia could have a similar appearance. 3. Aortic Atherosclerosis (ICD10-I70.0) and Emphysema (ICD10-J43.9). Coronary atherosclerosis, systemic atherosclerosis, and mild cardiomegaly.  6/6 Echocardiogram Left Ventricle: LVEF= 60 to 65%. The left ventricle has normal function. left ventricle has no  regional wall motion abnormalities.  -Left ventricular diastolic function could not be evaluated due to atrial fibrillation.  Right Ventricle: The right ventricular size is normal. No increase in  right ventricular wall thickness. Right ventricular systolic function is  normal. Tricuspid regurgitation signal is inadequate for assessing PA  pressure.     I have personally  reviewed and interpreted all radiology studies and my findings are as above.  VENTILATOR SETTINGS:    Cultures   Antimicrobials: Anti-infectives (From admission, onward)    Start     Ordered Stop   06/24/21 2100  azithromycin (ZITHROMAX) tablet 500 mg        06/24/21 1141 06/26/21 2059   06/24/21 1330  cefdinir (OMNICEF) capsule 300 mg        06/24/21 1215     06/22/21 2200  azithromycin (ZITHROMAX) 500 mg in sodium chloride 0.9 % 250 mL IVPB  Status:  Discontinued        06/22/21 0018 06/24/21 1141   06/22/21 2200  cefTRIAXone (ROCEPHIN) 1 g in sodium chloride 0.9 % 100 mL IVPB  Status:  Discontinued        06/22/21 0018 06/24/21 1215   06/21/21 2330  cefTRIAXone (ROCEPHIN) 1 g in sodium chloride  0.9 % 100 mL IVPB        06/21/21 2326 06/22/21 0120   06/21/21 2330  azithromycin (ZITHROMAX) 500 mg in sodium chloride 0.9 % 250 mL IVPB        06/21/21 2326 06/22/21 0250         Devices    LINES / TUBES:      Continuous Infusions:     Objective: Vitals:   06/24/21 1347 06/24/21 1639 06/24/21 1706 06/24/21 1835  BP: (!) 184/76  (!) 182/96 (!) 168/92  Pulse: (!) 55  62 69  Resp:  15    Temp:      TempSrc:      SpO2:  97% 97%   Weight:      Height:        Intake/Output Summary (Last 24 hours) at 06/24/2021 1956 Last data filed at 06/24/2021 1434 Gross per 24 hour  Intake 2014 ml  Output 1045 ml  Net 969 ml    Filed Weights   06/23/21 0500 06/23/21 0557 06/24/21 0412  Weight: 125.3 kg 125 kg 121.7 kg    Examination:  General: A/O x4, positive acute respiratory distress Eyes: negative scleral hemorrhage, negative anisocoria, negative icterus ENT: Negative Runny nose, negative gingival bleeding, Neck:  Negative scars, masses, torticollis, lymphadenopathy, JVD Lungs: poor air movement, diffuse expiratory wheeze, negative crackles Cardiovascular: Irregularly irregular rhythm and rate without murmur gallop or rub normal S1 and S2 Abdomen: negative abdominal pain, nondistended, positive soft, bowel sounds, no rebound, no ascites, no appreciable mass Extremities: No significant cyanosis, clubbing, or edema bilateral lower extremities Skin: Negative rashes, lesions, ulcers Psychiatric:  Negative depression, negative anxiety, negative fatigue, negative mania  Central nervous system:  Cranial nerves II through XII intact, tongue/uvula midline, all extremities muscle strength 5/5, sensation intact throughout, negative dysarthria, negative expressive aphasia, negative receptive aphasia.  .     Data Reviewed: Care during the described time interval was provided by me .  I have reviewed this patient's available data, including medical history, events of note, physical  examination, and all test results as part of my evaluation.  CBC: Recent Labs  Lab 06/21/21 1815 06/22/21 0431 06/23/21 0731 06/24/21 0607  WBC 7.2 7.8 5.9 6.6  NEUTROABS 6.1 6.3 4.7 5.0  HGB 14.7 13.0 12.6* 12.5*  HCT 41.3 36.7* 37.8* 37.9*  MCV 93.4 92.7 97.7 100.5*  PLT 285 290 277 [id number]    Basic Metabolic Panel: Recent Labs  Lab 06/21/21 2013 06/22/21 0431 06/22/21 1337 06/23/21 0507 06/24/21 0607  NA 123* 123* 127* 129* 133*  K 3.7 3.7 3.0* 3.5  3.9  CL 84* 86* 90* 97* 103  CO2 25 26 24 22  20*  GLUCOSE 129* 115* 142* 102* 118*  BUN 26* 27* 22 18 15   CREATININE 1.02 0.99 0.91 0.84 0.77  CALCIUM 8.5* 8.0* 8.0* 8.3* 8.8*  MG 1.5* 1.6*  --  2.1 2.2  PHOS  --   --   --  2.9 3.2    GFR: Estimated Creatinine Clearance: 134.4 mL/min (by C-G formula based on SCr of 0.77 mg/dL). Liver Function Tests: Recent Labs  Lab 06/21/21 2013 06/22/21 0431 06/23/21 0507 06/24/21 0607  AST 19 15 16 15   ALT 9 8 8 9   ALKPHOS 118 94 95 107  BILITOT 0.8 1.1 0.9 0.7  PROT 7.7 6.8 7.3 7.9  ALBUMIN 3.3* 2.8* 3.0* 3.0*    No results for input(s): "LIPASE", "AMYLASE" in the last 168 hours. No results for input(s): "AMMONIA" in the last 168 hours. Coagulation Profile: No results for input(s): "INR", "PROTIME" in the last 168 hours. Cardiac Enzymes: No results for input(s): "CKTOTAL", "CKMB", "CKMBINDEX", "TROPONINI" in the last 168 hours. BNP (last 3 results) Recent Labs    06/10/21 0916  PROBNP 1,316*    HbA1C: Recent Labs    06/23/21 0507  HGBA1C 7.2*    CBG: Recent Labs  Lab 06/23/21 1633 06/23/21 2115 06/24/21 0746 06/24/21 1138 06/24/21 1702  GLUCAP 145* 254* 261* 153* 138*    Lipid Profile: No results for input(s): "CHOL", "HDL", "LDLCALC", "TRIG", "CHOLHDL", "LDLDIRECT" in the last 72 hours. Thyroid Function Tests: Recent Labs    06/22/21 0501  TSH 1.836    Anemia Panel: No results for input(s): "VITAMINB12", "FOLATE", "FERRITIN", "TIBC", "IRON",  "RETICCTPCT" in the last 72 hours. Sepsis Labs: No results for input(s): "PROCALCITON", "LATICACIDVEN" in the last 168 hours.  Recent Results (from the past 240 hour(s))  Resp Panel by RT-PCR (Flu A&B, Covid) Anterior Nasal Swab     Status: None   Collection Time: 06/21/21  9:00 PM   Specimen: Anterior Nasal Swab  Result Value Ref Range Status   SARS Coronavirus 2 by RT PCR NEGATIVE NEGATIVE Final    Comment: (NOTE) SARS-CoV-2 target nucleic acids are NOT DETECTED.  The SARS-CoV-2 RNA is generally detectable in upper respiratory specimens during the acute phase of infection. The lowest concentration of SARS-CoV-2 viral copies this assay can detect is 138 copies/mL. A negative result does not preclude SARS-Cov-2 infection and should not be used as the sole basis for treatment or other patient management decisions. A negative result may occur with  improper specimen collection/handling, submission of specimen other than nasopharyngeal swab, presence of viral mutation(s) within the areas targeted by this assay, and inadequate number of viral copies(<138 copies/mL). A negative result must be combined with clinical observations, patient history, and epidemiological information. The expected result is Negative.  Fact Sheet for Patients:  EntrepreneurPulse.com.au  Fact Sheet for Healthcare Providers:  IncredibleEmployment.be  This test is no t yet approved or cleared by the [place] FDA and  has been authorized for detection and/or diagnosis of SARS-CoV-2 by FDA under an Emergency Use Authorization (EUA). This EUA will remain  in effect (meaning this test can be used) for the duration of the COVID-19 declaration under Section 564(b)(1) of the Act, 21 U.S.C.section 360bbb-3(b)(1), unless the authorization is terminated  or revoked sooner.       Influenza A by PCR NEGATIVE NEGATIVE Final   Influenza B by PCR NEGATIVE NEGATIVE Final     Comment: (NOTE) The Xpert  Xpress SARS-CoV-2/FLU/RSV plus assay is intended as an aid in the diagnosis of influenza from Nasopharyngeal swab specimens and should not be used as a sole basis for treatment. Nasal washings and aspirates are unacceptable for Xpert Xpress SARS-CoV-2/FLU/RSV testing.  Fact Sheet for Patients: EntrepreneurPulse.com.au  Fact Sheet for Healthcare Providers: IncredibleEmployment.be  This test is not yet approved or cleared by the [place] FDA and has been authorized for detection and/or diagnosis of SARS-CoV-2 by FDA under an Emergency Use Authorization (EUA). This EUA will remain in effect (meaning this test can be used) for the duration of the COVID-19 declaration under Section 564(b)(1) of the Act, 21 U.S.C. section 360bbb-3(b)(1), unless the authorization is terminated or revoked.  Performed at [facility], [place] [address] [place], [place] [address]   Expectorated Sputum Assessment w Gram Stain, Rflx to Resp Cult     Status: None   Collection Time: 06/22/21  9:32 AM   Specimen: Sputum  Result Value Ref Range Status   Specimen Description SPUTUM  Final   Special Requests NONE  Final   Sputum evaluation   Final    Sputum specimen not acceptable for testing.  Please recollect.   Performed at [facility], [place] [address], [place], [place] [address]    Report Status 06/22/2021 FINAL  Final         Radiology Studies: VAS US CAROTID  Result Date: 06/23/2021 Carotid Arterial Duplex Study Patient Name:  OLINDO SWARBRICK  Date of Exam:   06/23/2021 Medical Rec #: [id number]      Accession #:    [id number] Date of Birth: Aug 19, 1960      Patient Gender: M Patient Age:   34 years Exam Location:  [facility] Procedure:      VAS US CAROTID Referring Phys: DAVID ORTIZ --------------------------------------------------------------------------------  Indications:       Syncope. Risk  Factors:      Hypertension, Diabetes. Limitations        Today's exam was limited due to the body habitus of the                    patient, the high bifurcation of the carotid and patient                    positioning. Comparison Study:  No prior studies. Performing Technologist: Oliver Hum RVT  Examination Guidelines: A complete evaluation includes B-mode imaging, spectral Doppler, color Doppler, and power Doppler as needed of all accessible portions of each vessel. Bilateral testing is considered an integral part of a complete examination. Limited examinations for reoccurring indications may be performed as noted.  Right Carotid Findings: +----------+--------+--------+--------+-----------------------+--------+           PSV cm/sEDV cm/sStenosisPlaque Description     Comments +----------+--------+--------+--------+-----------------------+--------+ CCA Prox  61      9               smooth and heterogenous         +----------+--------+--------+--------+-----------------------+--------+ CCA Distal57      7               smooth and heterogenous         +----------+--------+--------+--------+-----------------------+--------+ ICA Prox  24      9               smooth and heterogenous         +----------+--------+--------+--------+-----------------------+--------+ ICA Distal39      11  tortuous +----------+--------+--------+--------+-----------------------+--------+ ECA       140     16                                              +----------+--------+--------+--------+-----------------------+--------+ +----------+--------+-------+--------+-------------------+           PSV cm/sEDV cmsDescribeArm Pressure (mmHG) +----------+--------+-------+--------+-------------------+ Subclavian207                                        +----------+--------+-------+--------+-------------------+ +---------+--------+--+--------+-+---------+  VertebralPSV cm/s24EDV cm/s9Antegrade +---------+--------+--+--------+-+---------+  Left Carotid Findings: +----------+--------+--------+--------+-----------------------+--------+           PSV cm/sEDV cm/sStenosisPlaque Description     Comments +----------+--------+--------+--------+-----------------------+--------+ CCA Prox  67      14              smooth and heterogenous         +----------+--------+--------+--------+-----------------------+--------+ CCA Distal58      10              smooth and heterogenous         +----------+--------+--------+--------+-----------------------+--------+ ICA Prox  70      22              smooth and heterogenous         +----------+--------+--------+--------+-----------------------+--------+ ICA Distal68      17                                     tortuous +----------+--------+--------+--------+-----------------------+--------+ ECA       177     19                                              +----------+--------+--------+--------+-----------------------+--------+ +----------+--------+--------+--------+-------------------+           PSV cm/sEDV cm/sDescribeArm Pressure (mmHG) +----------+--------+--------+--------+-------------------+ [id number]                                         +----------+--------+--------+--------+-------------------+ +---------+--------+--+--------+--+---------+ VertebralPSV cm/s34EDV cm/s11Antegrade +---------+--------+--+--------+--+---------+   Summary: Right Carotid: Velocities in the right ICA are consistent with a 1-39% stenosis. Left Carotid: Velocities in the left ICA are consistent with a 1-39% stenosis. Vertebrals: Bilateral vertebral arteries demonstrate antegrade flow. *See table(s) above for measurements and observations.  Electronically signed by Harold Barban MD on 06/23/2021 at 10:55:19 PM.    Final         Scheduled Meds:  atorvastatin  10 mg Oral Daily    azithromycin  500 mg Oral Daily   cefdinir  300 mg Oral Q12H   dapagliflozin propanediol  10 mg Oral Daily   doxazosin  4 mg Oral QHS   famotidine  40 mg Oral Daily   fluticasone furoate-vilanterol  1 puff Inhalation Daily   folic acid  1 mg Oral Daily   hydrALAZINE  50 mg Oral TID   insulin aspart  0-9 Units Subcutaneous TID WC   ipratropium-albuterol  3 mL Inhalation QID   lisinopril  5 mg Oral Daily   loratadine  10 mg Oral Daily  LORazepam  0-4 mg Oral Q8H   magnesium oxide  400 mg Oral Daily   mouth rinse  15 mL Mouth Rinse BID   metoprolol succinate  150 mg Oral Daily   multivitamin with minerals  1 tablet Oral Daily   pantoprazole  40 mg Oral Daily   predniSONE  50 mg Oral Q breakfast   rivaroxaban  20 mg Oral Q supper   sodium chloride  1 g Oral TID WC   spironolactone  12.5 mg Oral Daily   thiamine  100 mg Oral Daily   Or   thiamine  100 mg Intravenous Daily   Continuous Infusions:     LOS: 2 days    Time spent:40 min    Jaxson Keener, Geraldo Docker, MD [facility]   If 7PM-7AM, please contact night-coverage 06/24/2021, 7:56 PM

## 2021-06-24 NOTE — Plan of Care (Signed)

## 2021-06-24 NOTE — Progress Notes (Signed)
Pt removed his IV on day shift. Pt refusing additional IV access at this time. NP notified & aware.   Pt noncompliant with fluid restriction. RN continues to educate pt on importance of adhering to plan of care.  Pt continues to refuse bed alarm, and ambulates independently. RN re educated pt on his fall risk status and safety measures in place to keep him safe and free of injury. Spouse at bedside. Will continue to monitor.

## 2021-06-24 NOTE — Discharge Summary (Signed)
Physician Discharge Summary  Ronnie Dunn [name] DOB: March 10, 1960 DOA: 06/21/2021  PCP: [name], PA-C  Admit date: 06/21/2021 Discharge date: 06/25/2021  Time spent: 35 minutes  Recommendations for Outpatient Follow-up:   LOC -6/7 orthostatic vitals: Patient not orthostatic -6/7 ambulatory SPO2; did not meet criteria for home O2    Seizures (HCC) -ECG normal. -Most likely multifactorial, Hyponatremia.  A-fib with possible RVR, PNA, OSA patient refuses CPAP -6/7 carotid Doppler WNL see results below   Multilobular PNA/CAP -Complete 7 days of antibiotics -Prednisone 50 mg daily x 5 days -Flutter valve - Incentive spirometry - Breo Ellipta 100-25 1 puff daily - Combivent QID   COPD/Tobacco Abuse -6/7 per wife nicotine patches makes patient ill, however patient has decreased from 2 packs/day---> 1 pack/day   OSA (obstructive sleep apnea) -Declined using CPAP.   EtOH Abuse -Begin CIWA protocol. -Consult TOC. -Alcohol cessation advised.  Patient not interested in resources   Hyponatremia/Beer Potomania -Resolved Lab Results  Component Value Date   NA 136 06/25/2021   NA 133 (L) 06/24/2021   NA 129 (L) 06/23/2021   NA 127 (L) 06/22/2021   NA 123 (L) 06/22/2021  -Counseled to discontinue drinking -NaCl tablets 1 g TID x1 week then discontinue - Follow-up with PCP hyponatremia, beer Potomania, pneumonia   Essential HTN - Doxazosin 4 mg daily -Hydralazine 50 mg TID -Lisinopril 5 mg daily -Toprol 150 mg daily - Spironolactone 12.5 mg daily -Follow-up with PCP in 1 to 2 weeks titrate medication for HTN.   Chronic diastolic CHF - 6/7 echocardiogram unable to determine diastolic function see results below - Strict in and out +3.8 L - Daily weight Filed Weights   06/23/21 0557 06/24/21 0412 06/25/21 0500  Weight: 125 kg 121.7 kg 123.4 kg  -See HTN   Permanent atrial fibrillation (HCC) -Continue metoprolol and Xarelto.   Noncompliance medication -  Patient noncompliant with medication at home, which is part of his problem.  Patient intermittently noncompliant with medication here in the hospital.   DM type II uncontrolled without long-term insulin -6/7 hemoglobin A1c= 7.2 -6/7 spoke at length with patient and wife agreed to use insulin while in the hospital acutely.  DOES NOT want to start any medication upon discharge.  Will discuss with PCP    Gastroesophageal reflux disease -Pantoprazole 40 mg p.o. daily.   Protein-calorie malnutrition, moderate ([place]) -In setting  EtOH abuse. -Alcohol cessation advised. - Patient NOT INTERESTED in resources  Hypomagnesemia -Magnesium goal> 2   Obesity BMI35.38 kg/m. -6/7 patient and wife counseled on changing diet.  Not very receptive  Noncompliance medication - Patient noncompliant with medication at home, which is part of his problem.  Patient intermittently noncompliant with medication here in the hospital.   Discharge Diagnoses:  Principal Problem:   Seizures ([place]) Active Problems:   Hyponatremia   Class 1 obesity   Essential hypertension   Type 2 diabetes mellitus without complication, without long-term current use of insulin (HCC)   Chronic obstructive pulmonary disease (HCC)   Gastroesophageal reflux disease   Permanent atrial fibrillation (HCC)   OSA (obstructive sleep apnea)   Chronic heart failure with preserved ejection fraction (HCC)   Protein-calorie malnutrition, moderate (HCC)   Hypomagnesemia   Alcohol abuse   Loss of consciousness (HCC)   Obesity (BMI 30-39.9)   Noncompliance with medication regimen   Discharge Condition: Stable  Diet recommendation: Heart healthy/carb modified  Filed Weights   06/23/21 0557 06/24/21 0412 06/25/21 0500  Weight: 125 kg 121.7  kg 123.4 kg    History of present illness:  60 y.o. WM PMHx Chronic Diastolic CHF, permanent Atrial Fibrillation, COPD, OSA, class I obesity, HTN, GERD,  Hyponatremia, , questionable seizures,  thoracic aortic aneurysm without rupture, DM type II, ETOH abuse, Tobacco abuse   Admitted due to loss of consciousness.  He is not a very good historian but his wife stated that he has been complaining of weakness, dizziness and passed out.  He woke up after a minute or so and was confused for a few minutes.  However, the patient's wife stated that he had a similar episode earlier where he was passed out longer than a minute.  The patient stated that he drinks at least 6 cans of beer at least 4 times a week. No chest pain, palpitations, diaphoresis, PND, orthopnea or pitting edema of the lower extremities.  He also smokes cigarettes.He denied fever, chills, rhinorrhea, sore throat, wheezing or hemoptysis.   No abdominal pain, nausea, emesis, diarrhea, constipation, melena or hematochezia.  No flank pain, dysuria, frequency or hematuria.  No polyuria, polydipsia or polyphagia.   ED course: Initial vital signs were temperature 97.5 F, pulse 70, respiration 18, BP 140/67 and O2 sat 88% on room air.   Chest x-ray showed cardiomegaly and central vascular prominence without overt pulmonary edema or focal airspace consolidation.  CT head negative for acute findings but show atherosclerosis.  CTA with patchy nodular airspace opacity especially in the right lower lobe but also posteriorly in the right upper lobe and in the left lower lobe with suspicion for aspiration pneumonitis although multilobar pneumonia could have similar appearance.  Hospital Course:  See above  Procedures: 6/5 No filling defect is identified in the pulmonary arterial tree to suggest pulmonary embolus. Reduced sensitivity due to motion artifact. 2. Patchy and nodular airspace opacities especially in the right lower lobe but also posteriorly in the right upper lobe and in the left lower lobe. Appearance suspicious for aspiration pneumonitis although multilobar pneumonia could have a similar appearance. 3. Aortic Atherosclerosis  (ICD10-I70.0) and Emphysema (ICD10-J43.9). Coronary atherosclerosis, systemic atherosclerosis, and mild cardiomegaly.   6/6 Echocardiogram Left Ventricle: LVEF= 60 to 65%. The left ventricle no WMA  -Left ventricular diastolic function could not be evaluated due to atrial fibrillation. 6/7 Carotid Doppler Right Carotid: Velocities in the right ICA are consistent with a 1-39% stenosis. Left Carotid: Velocities in the left ICA are consistent with a 1-39% stenosis. Vertebrals: Bilateral vertebral arteries demonstrate antegrade flow.  Consultations:   Cultures    Antibiotics Anti-infectives (From admission, onward)    Start     Ordered Stop   06/24/21 2100  azithromycin (ZITHROMAX) tablet 500 mg        06/24/21 1141 06/26/21 2059   06/24/21 1330  cefdinir (OMNICEF) capsule 300 mg        06/24/21 1215     06/22/21 2200  azithromycin (ZITHROMAX) 500 mg in sodium chloride 0.9 % 250 mL IVPB  Status:  Discontinued        06/22/21 0018 06/24/21 1141   06/22/21 2200  cefTRIAXone (ROCEPHIN) 1 g in sodium chloride 0.9 % 100 mL IVPB  Status:  Discontinued        06/22/21 0018 06/24/21 1215   06/21/21 2330  cefTRIAXone (ROCEPHIN) 1 g in sodium chloride 0.9 % 100 mL IVPB        06/21/21 2326 06/22/21 0120   06/21/21 2330  azithromycin (ZITHROMAX) 500 mg in sodium chloride 0.9 % 250 mL IVPB  06/21/21 2326 06/22/21 0250         Discharge Exam: Vitals:   06/25/21 0654 06/25/21 0715 06/25/21 0825 06/25/21 0925  BP: (!) 179/84   (!) 159/79  Pulse: 81   83  Resp: 20 (!) 22 18 16   Temp: 98 F (36.7 C)   98 F (36.7 C)  TempSrc: Oral   Axillary  SpO2: 98%  95% 94%  Weight:      Height:        General: A/O x4, positive acute respiratory distress Eyes: negative scleral hemorrhage, negative anisocoria, negative icterus ENT: Negative Runny nose, negative gingival bleeding, Neck:  Negative scars, masses, torticollis, lymphadenopathy, JVD Lungs: poor air movement, diffuse expiratory  wheeze, negative crackles Cardiovascular: Irregularly irregular rhythm and rate without murmur gallop or rub normal S1 and S2  Discharge Instructions   Allergies as of 06/25/2021   No Known Allergies      Medication List     STOP taking these medications    furosemide 40 MG tablet Commonly known as: LASIX   metolazone 2.5 MG tablet Commonly known as: ZAROXOLYN       TAKE these medications    albuterol 108 (90 Base) MCG/ACT inhaler Commonly known as: VENTOLIN HFA Inhale 2 puffs into the lungs every 4 (four) hours as needed.   atorvastatin 10 MG tablet Commonly known as: LIPITOR Take 10 mg by mouth daily.   azithromycin 250 MG tablet Commonly known as: ZITHROMAX Take 1 tablet (250 mg total) by mouth daily.   cefdinir 300 MG capsule Commonly known as: OMNICEF Take 1 capsule (300 mg total) by mouth every 12 (twelve) hours.   cetirizine 10 MG tablet Commonly known as: ZYRTEC Take 10 mg by mouth daily.   dapagliflozin propanediol 10 MG Tabs tablet Commonly known as: Farxiga Take 1 tablet (10 mg total) by mouth daily before breakfast.   doxazosin 4 MG tablet Commonly known as: CARDURA Take 1 tablet (4 mg total) by mouth at bedtime.   famotidine 40 MG tablet Commonly known as: PEPCID Take 40 mg by mouth daily.   folic acid 1 MG tablet Commonly known as: FOLVITE Take 1 tablet (1 mg total) by mouth daily.   hydrALAZINE 50 MG tablet Commonly known as: APRESOLINE Take 1 tablet (50 mg total) by mouth 3 (three) times daily.   lisinopril 5 MG tablet Commonly known as: ZESTRIL Take 1 tablet (5 mg total) by mouth daily.   magnesium oxide 400 MG tablet Commonly known as: MAG-OX Take 400 mg by mouth daily.   metoprolol succinate 100 MG 24 hr tablet Commonly known as: TOPROL-XL Take 150 mg by mouth daily.   pantoprazole 40 MG tablet Commonly known as: PROTONIX Take 1 tablet (40 mg total) by mouth daily.   predniSONE 50 MG tablet Commonly known as:  DELTASONE Take 1 tablet (50 mg total) by mouth daily with breakfast.   rivaroxaban 20 MG Tabs tablet Commonly known as: XARELTO Take 1 tablet (20 mg total) by mouth daily with supper.   spironolactone 25 MG tablet Commonly known as: Aldactone Take 0.5 tablets (12.5 mg total) by mouth daily.   thiamine 100 MG tablet Take 1 tablet (100 mg total) by mouth daily.   traZODone 50 MG tablet Commonly known as: DESYREL Take 50 mg by mouth daily as needed for sleep (anxiety).   Trelegy Ellipta 100-62.5-25 MCG/ACT Aepb Generic drug: Fluticasone-Umeclidin-Vilant Inhale 1 puff into the lungs daily.       No Known Allergies  The results of significant diagnostics from this hospitalization (including imaging, microbiology, ancillary and laboratory) are listed below for reference.    Significant Diagnostic Studies: VAS US CAROTID  Result Date: 06/23/2021 Carotid Arterial Duplex Study Patient Name:  VICKIE BAYE  Date of Exam:   06/23/2021 Medical Rec #: [id number]      Accession #:    [id number] Date of Birth: January 20, 1960      Patient Gender: M Patient Age:   37 years Exam Location:  [facility] Procedure:      VAS US CAROTID Referring Phys: DAVID ORTIZ --------------------------------------------------------------------------------  Indications:       Syncope. Risk Factors:      Hypertension, Diabetes. Limitations        Today's exam was limited due to the body habitus of the                    patient, the high bifurcation of the carotid and patient                    positioning. Comparison Study:  No prior studies. Performing Technologist: Oliver Hum RVT  Examination Guidelines: A complete evaluation includes B-mode imaging, spectral Doppler, color Doppler, and power Doppler as needed of all accessible portions of each vessel. Bilateral testing is considered an integral part of a complete examination. Limited examinations for reoccurring indications may be performed as noted.  Right  Carotid Findings: +----------+--------+--------+--------+-----------------------+--------+           PSV cm/sEDV cm/sStenosisPlaque Description     Comments +----------+--------+--------+--------+-----------------------+--------+ CCA Prox  61      9               smooth and heterogenous         +----------+--------+--------+--------+-----------------------+--------+ CCA Distal57      7               smooth and heterogenous         +----------+--------+--------+--------+-----------------------+--------+ ICA Prox  24      9               smooth and heterogenous         +----------+--------+--------+--------+-----------------------+--------+ ICA Distal39      11                                     tortuous +----------+--------+--------+--------+-----------------------+--------+ ECA       140     16                                              +----------+--------+--------+--------+-----------------------+--------+ +----------+--------+-------+--------+-------------------+           PSV cm/sEDV cmsDescribeArm Pressure (mmHG) +----------+--------+-------+--------+-------------------+ Subclavian207                                        +----------+--------+-------+--------+-------------------+ +---------+--------+--+--------+-+---------+ VertebralPSV cm/s24EDV cm/s9Antegrade +---------+--------+--+--------+-+---------+  Left Carotid Findings: +----------+--------+--------+--------+-----------------------+--------+           PSV cm/sEDV cm/sStenosisPlaque Description     Comments +----------+--------+--------+--------+-----------------------+--------+ CCA Prox  67      14              smooth and heterogenous         +----------+--------+--------+--------+-----------------------+--------+  CCA Distal58      10              smooth and heterogenous         +----------+--------+--------+--------+-----------------------+--------+ ICA Prox  70       22              smooth and heterogenous         +----------+--------+--------+--------+-----------------------+--------+ ICA Distal68      17                                     tortuous +----------+--------+--------+--------+-----------------------+--------+ ECA       177     19                                              +----------+--------+--------+--------+-----------------------+--------+ +----------+--------+--------+--------+-------------------+           PSV cm/sEDV cm/sDescribeArm Pressure (mmHG) +----------+--------+--------+--------+-------------------+ [id number]                                         +----------+--------+--------+--------+-------------------+ +---------+--------+--+--------+--+---------+ VertebralPSV cm/s34EDV cm/s11Antegrade +---------+--------+--+--------+--+---------+   Summary: Right Carotid: Velocities in the right ICA are consistent with a 1-39% stenosis. Left Carotid: Velocities in the left ICA are consistent with a 1-39% stenosis. Vertebrals: Bilateral vertebral arteries demonstrate antegrade flow. *See table(s) above for measurements and observations.  Electronically signed by Harold Barban MD on 06/23/2021 at 10:55:19 PM.    Final    EEG adult  Result Date: 06/22/2021 Lora Havens, MD     06/22/2021  5:50 PM Patient Name: JAKAI KIRKEBY MRN: [id number] Epilepsy Attending: Lora Havens Referring Physician/Provider: Reubin Milan, MD Date: 06/22/2021 Duration: 22.20 mins Patient history: 61yo M patient had an episode observed by his wife earlier in the day in which he demonstrated evidence of tonic-clonic activity in bilateral hands, which lasted for approximately 30 seconds before spontaneously abating, without any subsequent recurrence of tonic-clonic activity.  Following the resolution of this episode, there is reportedly some evidence of confusion, that has subsequently resolved, with patient now back to baseline mental  status. EEG to evaluate for seizure Level of alertness: Awake AEDs during EEG study: Ativan Technical aspects: This EEG study was done with scalp electrodes positioned according to the 10-20 International system of electrode placement. Electrical activity was acquired at a sampling rate of 500Hz  and reviewed with a high frequency filter of 70Hz  and a low frequency filter of 1Hz . EEG data were recorded continuously and digitally stored. Description: The posterior dominant rhythm consists of 8 Hz activity of moderate voltage (25-35 uV) seen predominantly in posterior head regions, symmetric and reactive to eye opening and eye closing. Hyperventilation and photic stimulation were not performed.   IMPRESSION: This study is within normal limits. No seizures or epileptiform discharges were seen throughout the recording. Lora Havens   ECHOCARDIOGRAM COMPLETE  Result Date: 06/22/2021    ECHOCARDIOGRAM REPORT   Patient Name:   YAO BROSSMAN Date of Exam: 06/22/2021 Medical Rec #:  [id number]     Height:       74.0 in Accession #:    [id number]    Weight:       266.8 lb  Date of Birth:  10-11-1960     BSA:          2.456 m Patient Age:    74 years      BP:           126/66 mmHg Patient Gender: M             HR:           88 bpm. Exam Location:  Inpatient Procedure: 2D Echo, Cardiac Doppler and Color Doppler Indications:    Afib  History:        Patient has prior history of Echocardiogram examinations, most                 recent 03/29/2020. CHF, COPD; Risk Factors:Hypertension and                 Diabetes.  Sonographer:    Joette Catching RCS Referring Phys: [id number] [place]  Sonographer Comments: Patient is morbidly obese. IMPRESSIONS  1. Left ventricular ejection fraction, by estimation, is 60 to 65%. The left ventricle has normal function. The left ventricle has no regional wall motion abnormalities. There is mild concentric left ventricular hypertrophy. Left ventricular diastolic function could not be  evaluated.  2. Right ventricular systolic function is normal. The right ventricular size is normal. Tricuspid regurgitation signal is inadequate for assessing PA pressure.  3. The mitral valve is grossly normal. Trivial mitral valve regurgitation. No evidence of mitral stenosis.  4. The aortic valve is tricuspid. Aortic valve regurgitation is not visualized. No aortic stenosis is present.  5. Aortic dilatation noted. There is mild dilatation of the aortic root, measuring 40 mm. There is mild dilatation of the ascending aorta, measuring 42 mm.  6. The inferior vena cava is dilated in size with >50% respiratory variability, suggesting right atrial pressure of 8 mmHg. FINDINGS  Left Ventricle: Left ventricular ejection fraction, by estimation, is 60 to 65%. The left ventricle has normal function. The left ventricle has no regional wall motion abnormalities. The left ventricular internal cavity size was normal in size. There is  mild concentric left ventricular hypertrophy. Left ventricular diastolic function could not be evaluated due to atrial fibrillation. Left ventricular diastolic function could not be evaluated. Right Ventricle: The right ventricular size is normal. No increase in right ventricular wall thickness. Right ventricular systolic function is normal. Tricuspid regurgitation signal is inadequate for assessing PA pressure. Left Atrium: Left atrial size was normal in size. Right Atrium: Right atrial size was normal in size. Pericardium: There is no evidence of pericardial effusion. Presence of epicardial fat layer. Mitral Valve: The mitral valve is grossly normal. Trivial mitral valve regurgitation. No evidence of mitral valve stenosis. Tricuspid Valve: The tricuspid valve is grossly normal. Tricuspid valve regurgitation is trivial. No evidence of tricuspid stenosis. Aortic Valve: The aortic valve is tricuspid. Aortic valve regurgitation is not visualized. No aortic stenosis is present. Aortic valve mean  gradient measures 5.0 mmHg. Aortic valve peak gradient measures 9.1 mmHg. Aortic valve area, by VTI measures 2.69 cm. Pulmonic Valve: The pulmonic valve was grossly normal. Pulmonic valve regurgitation is not visualized. No evidence of pulmonic stenosis. Aorta: Aortic dilatation noted. There is mild dilatation of the aortic root, measuring 40 mm. There is mild dilatation of the ascending aorta, measuring 42 mm. Venous: The inferior vena cava is dilated in size with greater than 50% respiratory variability, suggesting right atrial pressure of 8 mmHg. IAS/Shunts: The atrial septum is grossly normal.  LEFT  VENTRICLE PLAX 2D LVIDd:         4.80 cm   Diastology LVIDs:         3.10 cm   LV e' medial:   9.57 cm/s LV PW:         1.50 cm   LV E/e' medial: 11.8 LV IVS:        1.40 cm LVOT diam:     2.20 cm LV SV:         71 LV SV Index:   29 LVOT Area:     3.80 cm  RIGHT VENTRICLE             IVC RV Basal diam:  3.60 cm     IVC diam: 2.10 cm RV Mid diam:    2.00 cm RV S prime:     14.80 cm/s TAPSE (M-mode): 2.5 cm LEFT ATRIUM             Index        RIGHT ATRIUM           Index LA diam:        3.60 cm 1.47 cm/m   RA Area:     21.80 cm LA Vol (A2C):   99.0 ml 40.30 ml/m  RA Volume:   64.50 ml  26.26 ml/m LA Vol (A4C):   54.6 ml 22.23 ml/m LA Biplane Vol: 74.6 ml 30.37 ml/m  AORTIC VALVE AV Area (Vmax):    2.45 cm AV Area (Vmean):   2.54 cm AV Area (VTI):     2.69 cm AV Vmax:           151.00 cm/s AV Vmean:          109.000 cm/s AV VTI:            0.263 m AV Peak Grad:      9.1 mmHg AV Mean Grad:      5.0 mmHg LVOT Vmax:         97.50 cm/s LVOT Vmean:        72.700 cm/s LVOT VTI:          0.186 m LVOT/AV VTI ratio: 0.71  AORTA Ao Root diam: 4.00 cm Ao Asc diam:  4.20 cm MITRAL VALVE MV Area (PHT): 5.54 cm     SHUNTS MV Decel Time: 137 msec     Systemic VTI:  0.19 m MV E velocity: 113.00 cm/s  Systemic Diam: 2.20 cm Lennie Odor MD Electronically signed by Lennie Odor MD Signature Date/Time: 06/22/2021/3:45:57 PM     Final    CT Angio Chest PE W/Cm &/Or Wo Cm  Result Date: 06/21/2021 CLINICAL DATA:  Weakness, dizziness, nausea, syncope with bowel incontinence. Hypoxia. EXAM: CT ANGIOGRAPHY CHEST WITH CONTRAST TECHNIQUE: Multidetector CT imaging of the chest was performed using the standard protocol during bolus administration of intravenous contrast. Multiplanar CT image reconstructions and MIPs were obtained to evaluate the vascular anatomy. RADIATION DOSE REDUCTION: This exam was performed according to the departmental dose-optimization program which includes automated exposure control, adjustment of the mA and/or kV according to patient size and/or use of iterative reconstruction technique. CONTRAST:  OMNIPAQUE IOHEXOL 350 MG/ML SOLN COMPARISON:  Chest radiograph 06/21/2021 FINDINGS: Despite efforts by the technologist and patient, motion artifact is present on today's exam and could not be eliminated. This reduces exam sensitivity and specificity. Cardiovascular: No filling defect is identified in the pulmonary arterial tree to suggest pulmonary embolus. Coronary, aortic arch, and branch vessel atherosclerotic vascular disease.  Mild cardiomegaly. Mediastinum/Nodes: Scattered small mediastinal lymph nodes are not pathologically enlarged by size criteria. Lungs/Pleura: Mild paraseptal emphysema the lung apices. Patchy and nodular airspace opacities posteriorly in the right upper lobe and in the right lower lobe. Similar indistinct nodular opacities in the left lower lobe. Appearance suspicious for aspiration pneumonitis although multilobar pneumonia could have a similar appearance. Upper Abdomen: Abdominal aortic atherosclerosis. Atherosclerotic calcification in the origins of the celiac trunk and SMA. Musculoskeletal: Thoracic spondylosis. Degenerative spurring at the sternoclavicular joints. Mild deformity of the left anterior fourth and fifth ribs likely from old healed fractures. Review of the MIP images confirms  the above findings. IMPRESSION: 1. No filling defect is identified in the pulmonary arterial tree to suggest pulmonary embolus. Reduced sensitivity due to motion artifact. 2. Patchy and nodular airspace opacities especially in the right lower lobe but also posteriorly in the right upper lobe and in the left lower lobe. Appearance suspicious for aspiration pneumonitis although multilobar pneumonia could have a similar appearance. 3. Aortic Atherosclerosis (ICD10-I70.0) and Emphysema (ICD10-J43.9). Coronary atherosclerosis, systemic atherosclerosis, and mild cardiomegaly. Electronically Signed   By: Van Clines M.D.   On: 06/21/2021 21:56   CT Head Wo Contrast  Result Date: 06/21/2021 CLINICAL DATA:  Syncopal episode for 30 seconds with bowel incontinence, nausea and dizziness, weakness. EXAM: CT HEAD WITHOUT CONTRAST TECHNIQUE: Contiguous axial images were obtained from the base of the skull through the vertex without intravenous contrast. RADIATION DOSE REDUCTION: This exam was performed according to the departmental dose-optimization program which includes automated exposure control, adjustment of the mA and/or kV according to patient size and/or use of iterative reconstruction technique. COMPARISON:  MRI brain 03/29/2020 and CT head 03/28/2020 FINDINGS: Brain: The brainstem, cerebellum, cerebral peduncles, thalami, basal ganglia, basilar cisterns, and ventricular system appear within normal limits. No intracranial hemorrhage, mass lesion, or acute CVA. Vascular: There is atherosclerotic calcification of the cavernous carotid arteries bilaterally. Skull: Unremarkable Sinuses/Orbits: Unremarkable Other: No supplemental non-categorized findings. IMPRESSION: 1. No acute intracranial findings. 2. Atherosclerosis. Electronically Signed   By: Van Clines M.D.   On: 06/21/2021 21:48   DG Chest Port 1 View  Result Date: 06/21/2021 CLINICAL DATA:  Syncope EXAM: PORTABLE CHEST 1 VIEW COMPARISON:  March 28, 2020 FINDINGS: Mild cardiac enlargement and central vascular prominence. No overt pulmonary edema or focal airspace consolidation. No visible pleural effusion or pneumothorax. The visualized skeletal structures are unremarkable. IMPRESSION: Cardiomegaly and central vascular prominence without overt pulmonary edema or focal airspace consolidation. Electronically Signed   By: Dahlia Bailiff M.D.   On: 06/21/2021 18:06    Microbiology: Recent Results (from the past 240 hour(s))  Resp Panel by RT-PCR (Flu A&B, Covid) Anterior Nasal Swab     Status: None   Collection Time: 06/21/21  9:00 PM   Specimen: Anterior Nasal Swab  Result Value Ref Range Status   SARS Coronavirus 2 by RT PCR NEGATIVE NEGATIVE Final    Comment: (NOTE) SARS-CoV-2 target nucleic acids are NOT DETECTED.  The SARS-CoV-2 RNA is generally detectable in upper respiratory specimens during the acute phase of infection. The lowest concentration of SARS-CoV-2 viral copies this assay can detect is 138 copies/mL. A negative result does not preclude SARS-Cov-2 infection and should not be used as the sole basis for treatment or other patient management decisions. A negative result may occur with  improper specimen collection/handling, submission of specimen other than nasopharyngeal swab, presence of viral mutation(s) within the areas targeted by this assay, and inadequate number of  viral copies(<138 copies/mL). A negative result must be combined with clinical observations, patient history, and epidemiological information. The expected result is Negative.  Fact Sheet for Patients:  EntrepreneurPulse.com.au  Fact Sheet for Healthcare Providers:  IncredibleEmployment.be  This test is no t yet approved or cleared by the [place] FDA and  has been authorized for detection and/or diagnosis of SARS-CoV-2 by FDA under an Emergency Use Authorization (EUA). This EUA will remain  in effect  (meaning this test can be used) for the duration of the COVID-19 declaration under Section 564(b)(1) of the Act, 21 U.S.C.section 360bbb-3(b)(1), unless the authorization is terminated  or revoked sooner.       Influenza A by PCR NEGATIVE NEGATIVE Final   Influenza B by PCR NEGATIVE NEGATIVE Final    Comment: (NOTE) The Xpert Xpress SARS-CoV-2/FLU/RSV plus assay is intended as an aid in the diagnosis of influenza from Nasopharyngeal swab specimens and should not be used as a sole basis for treatment. Nasal washings and aspirates are unacceptable for Xpert Xpress SARS-CoV-2/FLU/RSV testing.  Fact Sheet for Patients: EntrepreneurPulse.com.au  Fact Sheet for Healthcare Providers: IncredibleEmployment.be  This test is not yet approved or cleared by the [place] FDA and has been authorized for detection and/or diagnosis of SARS-CoV-2 by FDA under an Emergency Use Authorization (EUA). This EUA will remain in effect (meaning this test can be used) for the duration of the COVID-19 declaration under Section 564(b)(1) of the Act, 21 U.S.C. section 360bbb-3(b)(1), unless the authorization is terminated or revoked.  Performed at [facility], [place] [address], [place], [place] [address]   Expectorated Sputum Assessment w Gram Stain, Rflx to Resp Cult     Status: None   Collection Time: 06/22/21  9:32 AM   Specimen: Sputum  Result Value Ref Range Status   Specimen Description SPUTUM  Final   Special Requests NONE  Final   Sputum evaluation   Final    Sputum specimen not acceptable for testing.  Please recollect.   Performed at [facility], [place] [address], [place], [place] [address]    Report Status 06/22/2021 FINAL  Final     Labs: Basic Metabolic Panel: Recent Labs  Lab 06/21/21 2013 06/22/21 0431 06/22/21 1337 06/23/21 0507 06/24/21 0607 06/25/21 0537  NA 123* 123* 127* 129* 133* 136  K 3.7 3.7  3.0* 3.5 3.9 3.8  CL 84* 86* 90* 97* 103 105  CO2 25 26 24 22  20* 22  GLUCOSE 129* 115* 142* 102* 118* 122*  BUN 26* 27* 22 18 15 21   CREATININE 1.02 0.99 0.91 0.84 0.77 0.95  CALCIUM 8.5* 8.0* 8.0* 8.3* 8.8* 9.1  MG 1.5* 1.6*  --  2.1 2.2 2.0  PHOS  --   --   --  2.9 3.2 3.4   Liver Function Tests: Recent Labs  Lab 06/21/21 2013 06/22/21 0431 06/23/21 0507 06/24/21 0607 06/25/21 0537  AST 19 15 16 15 20   ALT 9 8 8 9 11   ALKPHOS 118 94 95 107 110  BILITOT 0.8 1.1 0.9 0.7 0.8  PROT 7.7 6.8 7.3 7.9 8.1  ALBUMIN 3.3* 2.8* 3.0* 3.0* 3.2*   No results for input(s): "LIPASE", "AMYLASE" in the last 168 hours. No results for input(s): "AMMONIA" in the last 168 hours. CBC: Recent Labs  Lab 06/21/21 1815 06/22/21 0431 06/23/21 0731 06/24/21 0607 06/25/21 0537  WBC 7.2 7.8 5.9 6.6 7.3  NEUTROABS 6.1 6.3 4.7 5.0 5.3  HGB 14.7 13.0 12.6* 12.5* 12.4*  HCT 41.3 36.7* 37.8* 37.9* 37.6*  MCV 93.4 92.7 97.7 100.5* 100.3*  PLT 285 290 277 317 350   Cardiac Enzymes: No results for input(s): "CKTOTAL", "CKMB", "CKMBINDEX", "TROPONINI" in the last 168 hours. BNP: BNP (last 3 results) Recent Labs    06/21/21 1815  BNP 77.0    ProBNP (last 3 results) Recent Labs    06/10/21 0916  PROBNP 1,316*    CBG: Recent Labs  Lab 06/24/21 0746 06/24/21 1138 06/24/21 1702 06/24/21 2140 06/25/21 0653  GLUCAP 261* 153* 138* 188* 109*       Signed:  Dia Crawford, MD [facility]

## 2021-06-24 NOTE — Progress Notes (Signed)
PHARMACIST - PHYSICIAN COMMUNICATION DR:   Joseph Art CONCERNING: Antibiotic IV to Oral Route Change Policy  RECOMMENDATION: This patient is receiving azithromycin by the intravenous route.  Based on criteria approved by the Pharmacy and Therapeutics Committee, the antibiotic(s) is/are being converted to the equivalent oral dose form(s).   DESCRIPTION: These criteria include: Patient being treated for a respiratory tract infection, urinary tract infection, cellulitis or clostridium difficile associated diarrhea if on metronidazole The patient is not neutropenic and does not exhibit a GI malabsorption state The patient is eating (either orally or via tube) and/or has been taking other orally administered medications for a least 24 hours The patient is improving clinically and has a Tmax < 100.5  If you have questions about this conversion, please contact the Pharmacy Department  []   [phone] )  [phone] []   [phone]   []   [phone] )  [facility] [x]   [phone] )  [facility]   [facility], PharmD 06/24/2021 11:41 AM

## 2021-06-25 DIAGNOSIS — R569 Unspecified convulsions: Secondary | ICD-10-CM | POA: Diagnosis not present

## 2021-06-25 DIAGNOSIS — J431 Panlobular emphysema: Secondary | ICD-10-CM

## 2021-06-25 DIAGNOSIS — K219 Gastro-esophageal reflux disease without esophagitis: Secondary | ICD-10-CM

## 2021-06-25 DIAGNOSIS — I5032 Chronic diastolic (congestive) heart failure: Secondary | ICD-10-CM | POA: Diagnosis not present

## 2021-06-25 DIAGNOSIS — J189 Pneumonia, unspecified organism: Secondary | ICD-10-CM | POA: Diagnosis not present

## 2021-06-25 DIAGNOSIS — F1010 Alcohol abuse, uncomplicated: Secondary | ICD-10-CM | POA: Diagnosis not present

## 2021-06-25 LAB — GLUCOSE, CAPILLARY: Glucose-Capillary: 109 mg/dL — ABNORMAL HIGH (ref 70–99)

## 2021-06-25 LAB — COMPREHENSIVE METABOLIC PANEL
ALT: 11 U/L (ref 0–44)
AST: 20 U/L (ref 15–41)
Albumin: 3.2 g/dL — ABNORMAL LOW (ref 3.5–5.0)
Alkaline Phosphatase: 110 U/L (ref 38–126)
Anion gap: 9 (ref 5–15)
BUN: 21 mg/dL (ref 8–23)
CO2: 22 mmol/L (ref 22–32)
Calcium: 9.1 mg/dL (ref 8.9–10.3)
Chloride: 105 mmol/L (ref 98–111)
Creatinine, Ser: 0.95 mg/dL (ref 0.61–1.24)
GFR, Estimated: >60 mL/min (ref >60)
Glucose, Bld: 122 mg/dL — ABNORMAL HIGH (ref 70–99)
Potassium: 3.8 mmol/L (ref 3.5–5.1)
Sodium: 136 mmol/L (ref 135–145)
Total Bilirubin: 0.8 mg/dL (ref 0.3–1.2)
Total Protein: 8.1 g/dL (ref 6.5–8.1)

## 2021-06-25 LAB — CBC WITH DIFFERENTIAL/PLATELET
Abs Immature Granulocytes: 0.03 10*3/uL (ref 0.00–0.07)
Basophils Absolute: 0.1 10*3/uL (ref 0.0–0.1)
Basophils Relative: 1 %
Eosinophils Absolute: 0.1 10*3/uL (ref 0.0–0.5)
Eosinophils Relative: 1 %
HCT: 37.6 % — ABNORMAL LOW (ref 39.0–52.0)
Hemoglobin: 12.4 g/dL — ABNORMAL LOW (ref 13.0–17.0)
Immature Granulocytes: 0 %
Lymphocytes Relative: 18 %
Lymphs Abs: 1.3 10*3/uL (ref 0.7–4.0)
MCH: 33.1 pg (ref 26.0–34.0)
MCHC: 33 g/dL (ref 30.0–36.0)
MCV: 100.3 fL — ABNORMAL HIGH (ref 80.0–100.0)
Monocytes Absolute: 0.6 10*3/uL (ref 0.1–1.0)
Monocytes Relative: 8 %
Neutro Abs: 5.3 10*3/uL (ref 1.7–7.7)
Neutrophils Relative %: 72 %
Platelets: 350 10*3/uL (ref 150–400)
RBC: 3.75 MIL/uL — ABNORMAL LOW (ref 4.22–5.81)
RDW: 14 % (ref 11.5–15.5)
WBC: 7.3 10*3/uL (ref 4.0–10.5)
nRBC: 0 % (ref 0.0–0.2)

## 2021-06-25 LAB — PHOSPHORUS: Phosphorus: 3.4 mg/dL (ref 2.5–4.6)

## 2021-06-25 LAB — MAGNESIUM: Magnesium: 2 mg/dL (ref 1.7–2.4)

## 2021-06-25 MED ORDER — CEFDINIR 300 MG PO CAPS: 300.0000 mg | ORAL_CAPSULE | Freq: Two times a day (BID) | ORAL | 0 refills | Status: DC

## 2021-06-25 MED ORDER — LISINOPRIL 5 MG PO TABS: 5.0000 mg | ORAL_TABLET | Freq: Every day | ORAL | 0 refills | Status: DC

## 2021-06-25 MED ORDER — FOLIC ACID 1 MG PO TABS: 1.0000 mg | ORAL_TABLET | Freq: Every day | ORAL | 0 refills | Status: DC

## 2021-06-25 MED ORDER — THIAMINE HCL 100 MG PO TABS: 100.0000 mg | ORAL_TABLET | Freq: Every day | ORAL | 0 refills | Status: DC

## 2021-06-25 MED ORDER — HYDRALAZINE HCL 50 MG PO TABS: 50.0000 mg | ORAL_TABLET | Freq: Three times a day (TID) | ORAL | 0 refills | Status: DC

## 2021-06-25 MED ORDER — PANTOPRAZOLE SODIUM 40 MG PO TBEC: 40.0000 mg | DELAYED_RELEASE_TABLET | Freq: Every day | ORAL | 0 refills | Status: DC

## 2021-06-25 MED ORDER — PREDNISONE 50 MG PO TABS: 50.0000 mg | ORAL_TABLET | Freq: Every day | ORAL | 0 refills | Status: DC

## 2021-06-25 MED ORDER — AZITHROMYCIN 250 MG PO TABS: 250.0000 mg | ORAL_TABLET | Freq: Every day | ORAL | 0 refills | Status: DC

## 2021-06-25 NOTE — Progress Notes (Signed)
Pt attempting to leave unit. RN let pt know he is unable to leave the unit for his safety.  Pt became agitated & aggressive, yelling & cursing stating he was leaving, he could not be held against his will. Pt aox4, but forgetful with non-lucid thoughts, change from assessment completed earlier in shift. Pt stated he was going to call the police. Security and house supervisor called, pt leaves unit.   Garner Nash NP notified. Pt along with security & house supervisor return to unit. Pt decides to remain in hospital at this time. CIWA score of 12, increased from baseline. BP elevated. RN along with house supervisor at bedside educated pt on benefits of ativan, pt refusing ativan. Pt refusing telemetry, Garner Nash NP notified & aware. Will continue to monitor.

## 2021-06-30 ENCOUNTER — Ambulatory Visit (INDEPENDENT_AMBULATORY_CARE_PROVIDER_SITE_OTHER): Payer: 59 | Admitting: Physician Assistant

## 2021-06-30 ENCOUNTER — Encounter: Payer: Self-pay | Admitting: Physician Assistant

## 2021-06-30 VITALS — BP 136/84 | HR 75 | Temp 97.7°F | Ht 74.0 in | Wt 272.0 lb

## 2021-06-30 DIAGNOSIS — E871 Hypo-osmolality and hyponatremia: Secondary | ICD-10-CM

## 2021-06-30 DIAGNOSIS — E119 Type 2 diabetes mellitus without complications: Secondary | ICD-10-CM

## 2021-06-30 DIAGNOSIS — R600 Localized edema: Secondary | ICD-10-CM

## 2021-06-30 DIAGNOSIS — J431 Panlobular emphysema: Secondary | ICD-10-CM

## 2021-06-30 DIAGNOSIS — R569 Unspecified convulsions: Secondary | ICD-10-CM | POA: Diagnosis not present

## 2021-06-30 DIAGNOSIS — I5032 Chronic diastolic (congestive) heart failure: Secondary | ICD-10-CM

## 2021-06-30 DIAGNOSIS — J189 Pneumonia, unspecified organism: Secondary | ICD-10-CM | POA: Diagnosis not present

## 2021-06-30 DIAGNOSIS — Z09 Encounter for follow-up examination after completed treatment for conditions other than malignant neoplasm: Secondary | ICD-10-CM | POA: Diagnosis not present

## 2021-06-30 DIAGNOSIS — I4821 Permanent atrial fibrillation: Secondary | ICD-10-CM

## 2021-06-30 DIAGNOSIS — F1020 Alcohol dependence, uncomplicated: Secondary | ICD-10-CM

## 2021-06-30 DIAGNOSIS — I10 Essential (primary) hypertension: Secondary | ICD-10-CM

## 2021-06-30 MED ORDER — SPIRONOLACTONE 25 MG PO TABS: 25.0000 mg | ORAL_TABLET | Freq: Every day | ORAL | 3 refills | Status: DC

## 2021-06-30 NOTE — Progress Notes (Signed)
Established patient visit   Patient: Ronnie Dunn   DOB: Jun 09, 1960   61 y.o. Male  MRN: [id number] Visit Date: 06/30/2021  Chief Complaint  Patient presents with   Hospitalization Follow-up   Subjective    HPI  Patient presents for hospital follow-up. Patient reports feeling better then when he was in the hospital. States has been using the valve flutter which has been helpful. Reports was given samples of Breo and Combivent during hospitalization and was advised to resume Trelegy when completed. States has not been taking Doxazosin 4 mg at bedtime and blood pressure at home has been good. States BP running 130-135/65-75. Checking BP at home 3-4 times per day. Also has been monitoring his oxygen saturations which have been 95% or greater. Reports will work on weight loss and has reduced beer intake to 4-5 3-4 days/wk and smoking 1 PPD and some days less. States has noticed her lower legs swelling more since furosemide was stopped.     Discharge summary: Admit date: 06/21/2021 Discharge date: 06/25/2021   Time spent: 35 minutes   Recommendations for Outpatient Follow-up:    LOC -6/7 orthostatic vitals: Patient not orthostatic -6/7 ambulatory SPO2; did not meet criteria for home O2    Seizures (HCC) -ECG normal. -Most likely multifactorial, Hyponatremia.  A-fib with possible RVR, PNA, OSA patient refuses CPAP -6/7 carotid Doppler WNL see results below   Multilobular PNA/CAP -Complete 7 days of antibiotics -Prednisone 50 mg daily x 5 days -Flutter valve - Incentive spirometry - Breo Ellipta 100-25 1 puff daily - Combivent QID   COPD/Tobacco Abuse -6/7 per wife nicotine patches makes patient ill, however patient has decreased from 2 packs/day---> 1 pack/day   OSA (obstructive sleep apnea) -Declined using CPAP.   EtOH Abuse -Begin CIWA protocol. -Consult TOC. -Alcohol cessation advised.  Patient not interested in resources   Hyponatremia/Beer Potomania -Resolved Recent  Labs       Lab Results  Component Value Date    NA 136 06/25/2021    NA 133 (L) 06/24/2021    NA 129 (L) 06/23/2021    NA 127 (L) 06/22/2021    NA 123 (L) 06/22/2021    -Counseled to discontinue drinking -NaCl tablets 1 g TID x1 week then discontinue - Follow-up with PCP hyponatremia, beer Potomania, pneumonia   Essential HTN - Doxazosin 4 mg daily -Hydralazine 50 mg TID -Lisinopril 5 mg daily -Toprol 150 mg daily - Spironolactone 12.5 mg daily -Follow-up with PCP in 1 to 2 weeks titrate medication for HTN.   Chronic diastolic CHF - 6/7 echocardiogram unable to determine diastolic function see results below - Strict in and out +3.8 L - Daily weight      Filed Weights    06/23/21 0557 06/24/21 0412 06/25/21 0500  Weight: 125 kg 121.7 kg 123.4 kg  -See HTN   Permanent atrial fibrillation (HCC) -Continue metoprolol and Xarelto.   Noncompliance medication - Patient noncompliant with medication at home, which is part of his problem.  Patient intermittently noncompliant with medication here in the hospital.   DM type II uncontrolled without long-term insulin -6/7 hemoglobin A1c= 7.2 -6/7 spoke at length with patient and wife agreed to use insulin while in the hospital acutely.  DOES NOT want to start any medication upon discharge.  Will discuss with PCP    Gastroesophageal reflux disease -Pantoprazole 40 mg p.o. daily.   Protein-calorie malnutrition, moderate ([place]) -In setting  EtOH abuse. -Alcohol cessation advised. - Patient NOT INTERESTED in resources  Hypomagnesemia -Magnesium goal> 2   Obesity BMI35.38 kg/m. -6/7 patient and wife counseled on changing diet.  Not very receptive   Noncompliance medication - Patient noncompliant with medication at home, which is part of his problem.  Patient intermittently noncompliant with medication here in the hospital.     Discharge Diagnoses:  Principal Problem:   Seizures ([place]) Active Problems:   Hyponatremia    Class 1 obesity   Essential hypertension   Type 2 diabetes mellitus without complication, without long-term current use of insulin (HCC)   Chronic obstructive pulmonary disease (HCC)   Gastroesophageal reflux disease   Permanent atrial fibrillation (HCC)   OSA (obstructive sleep apnea)   Chronic heart failure with preserved ejection fraction (HCC)   Protein-calorie malnutrition, moderate (HCC)   Hypomagnesemia   Alcohol abuse   Loss of consciousness (HCC)   Obesity (BMI 30-39.9)   Noncompliance with medication regimen     Discharge Condition: Stable   Diet recommendation: Heart healthy/carb modified        Filed Weights    06/23/21 0557 06/24/21 0412 06/25/21 0500  Weight: 125 kg 121.7 kg 123.4 kg      History of present illness:  60 y.o. WM PMHx Chronic Diastolic CHF, permanent Atrial Fibrillation, COPD, OSA, class I obesity, HTN, GERD,  Hyponatremia, , questionable seizures, thoracic aortic aneurysm without rupture, DM type II, ETOH abuse, Tobacco abuse   Admitted due to loss of consciousness.  He is not a very good historian but his wife stated that he has been complaining of weakness, dizziness and passed out.  He woke up after a minute or so and was confused for a few minutes.  However, the patient's wife stated that he had a similar episode earlier where he was passed out longer than a minute.  The patient stated that he drinks at least 6 cans of beer at least 4 times a week. No chest pain, palpitations, diaphoresis, PND, orthopnea or pitting edema of the lower extremities.  He also smokes cigarettes.He denied fever, chills, rhinorrhea, sore throat, wheezing or hemoptysis.   No abdominal pain, nausea, emesis, diarrhea, constipation, melena or hematochezia.  No flank pain, dysuria, frequency or hematuria.  No polyuria, polydipsia or polyphagia.   ED course: Initial vital signs were temperature 97.5 F, pulse 70, respiration 18, BP 140/67 and O2 sat 88% on room air.   Chest x-ray  showed cardiomegaly and central vascular prominence without overt pulmonary edema or focal airspace consolidation.  CT head negative for acute findings but show atherosclerosis.  CTA with patchy nodular airspace opacity especially in the right lower lobe but also posteriorly in the right upper lobe and in the left lower lobe with suspicion for aspiration pneumonitis although multilobar pneumonia could have similar appearance.   Hospital Course:  See above   Procedures: 6/5 No filling defect is identified in the pulmonary arterial tree to suggest pulmonary embolus. Reduced sensitivity due to motion artifact. 2. Patchy and nodular airspace opacities especially in the right lower lobe but also posteriorly in the right upper lobe and in the left lower lobe. Appearance suspicious for aspiration pneumonitis although multilobar pneumonia could have a similar appearance. 3. Aortic Atherosclerosis (ICD10-I70.0) and Emphysema (ICD10-J43.9). Coronary atherosclerosis, systemic atherosclerosis, and mild cardiomegaly.   6/6 Echocardiogram Left Ventricle: LVEF= 60 to 65%. The left ventricle no WMA  -Left ventricular diastolic function could not be evaluated due to atrial fibrillation. 6/7 Carotid Doppler Right Carotid: Velocities in the right ICA are consistent with a 1-39%  stenosis. Left Carotid: Velocities in the left ICA are consistent with a 1-39% stenosis. Vertebrals: Bilateral vertebral arteries demonstrate antegrade flow.           Medications: Outpatient Medications Prior to Visit  Medication Sig   albuterol (VENTOLIN HFA) 108 (90 Base) MCG/ACT inhaler Inhale 2 puffs into the lungs every 4 (four) hours as needed.   atorvastatin (LIPITOR) 10 MG tablet Take 10 mg by mouth daily.   azithromycin (ZITHROMAX) 250 MG tablet Take 1 tablet (250 mg total) by mouth daily.   cefdinir (OMNICEF) 300 MG capsule Take 1 capsule (300 mg total) by mouth every 12 (twelve) hours.   cetirizine (ZYRTEC) 10  MG tablet Take 10 mg by mouth daily.   dapagliflozin propanediol (FARXIGA) 10 MG TABS tablet Take 1 tablet (10 mg total) by mouth daily before breakfast.   doxazosin (CARDURA) 4 MG tablet Take 1 tablet (4 mg total) by mouth at bedtime.   famotidine (PEPCID) 40 MG tablet Take 40 mg by mouth daily.   Fluticasone-Umeclidin-Vilant (TRELEGY ELLIPTA) 100-62.5-25 MCG/INH AEPB Inhale 1 puff into the lungs daily.   folic acid (FOLVITE) 1 MG tablet Take 1 tablet (1 mg total) by mouth daily.   hydrALAZINE (APRESOLINE) 50 MG tablet Take 1 tablet (50 mg total) by mouth 3 (three) times daily.   lisinopril (ZESTRIL) 5 MG tablet Take 1 tablet (5 mg total) by mouth daily.   magnesium oxide (MAG-OX) 400 MG tablet Take 400 mg by mouth daily.   metoprolol succinate (TOPROL-XL) 100 MG 24 hr tablet Take 150 mg by mouth daily.   pantoprazole (PROTONIX) 40 MG tablet Take 1 tablet (40 mg total) by mouth daily.   predniSONE (DELTASONE) 50 MG tablet Take 1 tablet (50 mg total) by mouth daily with breakfast.   rivaroxaban (XARELTO) 20 MG TABS tablet Take 1 tablet (20 mg total) by mouth daily with supper.   thiamine 100 MG tablet Take 1 tablet (100 mg total) by mouth daily.   traZODone (DESYREL) 50 MG tablet Take 50 mg by mouth daily as needed for sleep (anxiety).   [DISCONTINUED] spironolactone (ALDACTONE) 25 MG tablet Take 0.5 tablets (12.5 mg total) by mouth daily.   No facility-administered medications prior to visit.    Review of Systems Review of Systems:  A fourteen system review of systems was performed and found to be positive as per HPI.   Last CBC Lab Results  Component Value Date   WBC 7.3 06/25/2021   HGB 12.4 (L) 06/25/2021   HCT 37.6 (L) 06/25/2021   MCV 100.3 (H) 06/25/2021   MCH 33.1 06/25/2021   RDW 14.0 06/25/2021   PLT 350 76/16/0737   Last metabolic panel Lab Results  Component Value Date   GLUCOSE 122 (H) 06/25/2021   NA 136 06/25/2021   K 3.8 06/25/2021   CL 105 06/25/2021   CO2  22 06/25/2021   BUN 21 06/25/2021   CREATININE 0.95 06/25/2021   GFRNONAA >60 06/25/2021   CALCIUM 9.1 06/25/2021   PHOS 3.4 06/25/2021   PROT 8.1 06/25/2021   ALBUMIN 3.2 (L) 06/25/2021   BILITOT 0.8 06/25/2021   ALKPHOS 110 06/25/2021   AST 20 06/25/2021   ALT 11 06/25/2021   ANIONGAP 9 06/25/2021   Last lipids No results found for: "CHOL", "HDL", "LDLCALC", "LDLDIRECT", "TRIG", "CHOLHDL" Last hemoglobin A1c Lab Results  Component Value Date   HGBA1C 7.2 (H) 06/23/2021   Last thyroid functions Lab Results  Component Value Date   TSH 1.836 06/22/2021  Last vitamin D No results found for: "25OHVITD2", "25OHVITD3", "VD25OH" Last vitamin B12 and Folate No results found for: "VITAMINB12", "FOLATE"   Objective    BP 136/84   Pulse 75   Temp 97.7 F (36.5 C)   Ht _0  (1.88 m)   Wt 272 lb (123.4 kg)   SpO2 95%   BMI 34.92 kg/m  BP Readings from Last 3 Encounters:  06/30/21 136/84  06/25/21 (!) 159/79  06/01/21 130/88   Wt Readings from Last 3 Encounters:  06/30/21 272 lb (123.4 kg)  06/25/21 272 lb (123.4 kg)  06/01/21 268 lb (121.6 kg)    Physical Exam  General:  Cooperative, in no acute distress, non-diaphoretic. Neuro:  Alert and oriented,  extra-ocular muscles intact  HEENT:  Normocephalic, atraumatic, neck supple  Skin:  no gross rash, warm, pink. Cardiac:  IRR Respiratory: Mild expiratory wheezing and rhonchi at lung bases, no crackles or rales.  Vascular:  Ext warm, no cyanosis apprec.; 1+ pitting edema bilaterally  Psych:  No HI/SI, judgement and insight good, Euthymic mood. Full Affect.   No results found for any visits on 06/30/21.  Assessment & Plan      Problem List Items Addressed This Visit       Cardiovascular and Mediastinum   Essential hypertension   Relevant Medications   spironolactone (ALDACTONE) 25 MG tablet   Other Relevant Orders   Comp Met (CMET)   Permanent atrial fibrillation (HCC)   Relevant Medications    spironolactone (ALDACTONE) 25 MG tablet   Chronic heart failure with preserved ejection fraction (HCC)   Relevant Medications   spironolactone (ALDACTONE) 25 MG tablet     Respiratory   Chronic obstructive pulmonary disease ([place])     Endocrine   Type 2 diabetes mellitus without complication, without long-term current use of insulin (HCC)     Other   Seizure ([place])   Hyponatremia   Relevant Orders   Comp Met (CMET)   Alcohol use disorder, moderate, dependence ([place])   Lower extremity edema   Relevant Medications   spironolactone (ALDACTONE) 25 MG tablet   Other Visit Diagnoses     Hospital discharge follow-up    -  Primary   Relevant Orders   CBC w/Diff   Comp Met (CMET)   Pneumonia of both lungs due to infectious organism, unspecified part of lung       Relevant Orders   CBC w/Diff      Hospital discharge follow-up, Multilobular pneumonia: -Reviewed ED notes, labs and imaging. -Patient will complete azithromycin and prednisone today and cefdinir later this week. Recommend repeating chest x-ray. Patient's wife will contact his insurance and inquire which imaging facilities are in-network ([facility] is not) and notify the office where the order should be placed. -Will repeat CBC in 1 week.   Seizure: -Patient has history of seizure. EEG performed in the hospital normal. Discussed with patient loss of consciousness likely secondary to hyponatremia and pneumonia.  Chronic heart failure with preserved ejection fraction, Lower extremity edema: -Patient on Farxiga 10 mg and spironolactone 12.5 mg daily. Patient has pitting edema on exam today. Will increase spironolactone to 25 mg daily. Will repeat CMP in 1 week. Recommend to limit fluid <2L and follow a low sodium diet.  -Echocardiogram 06/22/21: LVEF 60-65% -Follow-up with cardiology as scheduled.  Hyponatremia: -CMP from 06/25/21, sodium 136, wnl's. Patient no longer on furosemide. Will repeat CMP in 1 week to monitor electrolytes  with increased dose of spironolactone.   Type  2 diabetes mellitus without complication, without long-term current use of insulin: -Discussed with patient recent A1c 7.2, goal <7.0. Patient was started on Farxiga 10 mg by cardiology for CHF which also help with diabetes mellitus. Discussed diabetic diet. Offered glucometer kit, pt declined at this time. Will repeat A1c in 3 months.  Permanent atrial fibrillation: -Rate controlled. -On Xarelto 20 mg daily and Metoprolo succinate 150 mg daily. -Follow-up with cardiology as scheduled.   Alcohol use disorder: -Recommend to continue reducing alcohol use.   Essential hypertension: -Patient reports ambulatory BP readings stable. BP elevated on intake which improved when repeated. Discussed to continue with Lisinopril 5 mg, hydralazine 20 mg TID, metoprolol succinate 150 mg daily and start taking full tablet of spironolactone 25 mg. Advised if BP starts to increase and is consistently >140/90 then recommend resuming doxazosin 4 mg at bedtime. Will continue to monitor. Will repeat CMP for medication monitoring in 1 week.   COPD: -Continue Trelegy. -Continue to reduce tobacco use and eventually quit.      Return in about 3 months (around 09/30/2021) for DM, HTN, HLD; lab visit in 1 weeek for CMP, CBC and CXR.        [name], PA-C  [facility] [phone] (phone) [phone] (fax)  [place]

## 2021-06-30 NOTE — Patient Instructions (Signed)

## 2021-07-02 ENCOUNTER — Other Ambulatory Visit: Payer: Self-pay | Admitting: Physician Assistant

## 2021-07-02 DIAGNOSIS — E119 Type 2 diabetes mellitus without complications: Secondary | ICD-10-CM

## 2021-07-02 DIAGNOSIS — I10 Essential (primary) hypertension: Secondary | ICD-10-CM

## 2021-07-07 ENCOUNTER — Other Ambulatory Visit: Payer: 59

## 2021-07-07 ENCOUNTER — Telehealth: Payer: Self-pay | Admitting: Physician Assistant

## 2021-07-07 DIAGNOSIS — E119 Type 2 diabetes mellitus without complications: Secondary | ICD-10-CM

## 2021-07-07 DIAGNOSIS — E871 Hypo-osmolality and hyponatremia: Secondary | ICD-10-CM

## 2021-07-07 DIAGNOSIS — I10 Essential (primary) hypertension: Secondary | ICD-10-CM

## 2021-07-07 DIAGNOSIS — J189 Pneumonia, unspecified organism: Secondary | ICD-10-CM

## 2021-07-07 DIAGNOSIS — Z09 Encounter for follow-up examination after completed treatment for conditions other than malignant neoplasm: Secondary | ICD-10-CM

## 2021-07-07 NOTE — Telephone Encounter (Signed)
He is asking if something can be prescribed for pain if he can't take the Naproxen?

## 2021-07-07 NOTE — Telephone Encounter (Signed)
Patient requesting refill of Naproxen 500 mg. Please advise.

## 2021-07-08 LAB — COMPREHENSIVE METABOLIC PANEL
ALT: 5 IU/L (ref 0–44)
AST: 11 IU/L (ref 0–40)
Albumin/Globulin Ratio: 1.2 (ref 1.2–2.2)
Albumin: 3.7 g/dL — ABNORMAL LOW (ref 3.8–4.8)
Alkaline Phosphatase: 119 IU/L (ref 44–121)
BUN/Creatinine Ratio: 16 (ref 10–24)
BUN: 17 mg/dL (ref 8–27)
Bilirubin Total: 0.7 mg/dL (ref 0.0–1.2)
CO2: 27 mmol/L (ref 20–29)
Calcium: 9.5 mg/dL (ref 8.6–10.2)
Chloride: 95 mmol/L — ABNORMAL LOW (ref 96–106)
Creatinine, Ser: 1.09 mg/dL (ref 0.76–1.27)
Globulin, Total: 3.2 g/dL (ref 1.5–4.5)
Glucose: 145 mg/dL — ABNORMAL HIGH (ref 70–99)
Potassium: 4.5 mmol/L (ref 3.5–5.2)
Sodium: 138 mmol/L (ref 134–144)
Total Protein: 6.9 g/dL (ref 6.0–8.5)
eGFR: 77 mL/min/{1.73_m2} (ref >59)

## 2021-07-08 LAB — CBC WITH DIFFERENTIAL/PLATELET
Basophils Absolute: 0.1 10*3/uL (ref 0.0–0.2)
Basos: 1 %
EOS (ABSOLUTE): 0.1 10*3/uL (ref 0.0–0.4)
Eos: 2 %
Hematocrit: 40.6 % (ref 37.5–51.0)
Hemoglobin: 13.5 g/dL (ref 13.0–17.7)
Immature Grans (Abs): 0 10*3/uL (ref 0.0–0.1)
Immature Granulocytes: 0 %
Lymphocytes Absolute: 1.2 10*3/uL (ref 0.7–3.1)
Lymphs: 17 %
MCH: 32.5 pg (ref 26.6–33.0)
MCHC: 33.3 g/dL (ref 31.5–35.7)
MCV: 98 fL — ABNORMAL HIGH (ref 79–97)
Monocytes Absolute: 0.8 10*3/uL (ref 0.1–0.9)
Monocytes: 10 %
Neutrophils Absolute: 5.1 10*3/uL (ref 1.4–7.0)
Neutrophils: 70 %
Platelets: 512 10*3/uL — ABNORMAL HIGH (ref 150–450)
RBC: 4.16 x10E6/uL (ref 4.14–5.80)
RDW: 13.1 % (ref 11.6–15.4)
WBC: 7.3 10*3/uL (ref 3.4–10.8)

## 2021-07-09 NOTE — Telephone Encounter (Signed)
Patient aware.

## 2021-07-26 ENCOUNTER — Telehealth: Payer: Self-pay | Admitting: Physician Assistant

## 2021-07-26 NOTE — Telephone Encounter (Signed)
Patient had hospital follow up on June 14th.

## 2021-07-26 NOTE — Telephone Encounter (Signed)
Patient was given these medications at the hospital upon discharge and now needs refills; Thiamine 100 mg, Hydralazine 50 mg, Pantoprazole 40 mg, Lisinopril 5 mg, and Folic Acid 1 mg. Please advise.

## 2021-07-26 NOTE — Telephone Encounter (Signed)
Please contact patient and schedule a hospital follow up with provider to discuss them prescribing these medications. AS, CMA

## 2021-07-27 NOTE — Progress Notes (Deleted)
Cardiology Office Note    Date:  07/27/2021   ID:  Ronnie Dunn, DOB May 28, 1960, MRN [id number]   PCP:  [facility]  Cardiologist:  None *** Advanced Practice Provider:  No care team member to display Electrophysiologist:  None   [id number]   No chief complaint on file.   History of Present Illness:  Ronnie Dunn is a 61 y.o. male with a hx of PAF, COPD, still actively smoking, blood in stool, HTN with DM, alcohol use, long history of passing out after he started mixing chemicals together. Found to have low sodium and aeizures.           .  Patient saw Dr. Izora Ribas 06/01/21 and cleared for surgery. Ordered echo and increased farxiga and added metolazone 2.5 mg weekly.  Patient discharged 06/25/21 with LOC felt due to seizures,multilobular PNA/CAP medical noncompliance. Echo 06/22/21 EF 60-65%, not orthostatic.Marland Kitchen  Past Medical History:  Diagnosis Date   Chronic heart failure with preserved ejection fraction (HCC) 06/01/2021   Chronic obstructive pulmonary disease (HCC) 05/28/2021   Class 1 obesity 03/29/2020   Essential hypertension 03/29/2020   Gastroesophageal reflux disease 05/28/2021   Hyponatremia 03/29/2020   OSA (obstructive sleep apnea) 06/01/2021   Permanent atrial fibrillation (HCC) 05/28/2021   Thoracic aortic aneurysm without rupture (HCC) 06/01/2021   Type 2 diabetes mellitus without complication, without long-term current use of insulin (HCC) 05/28/2021    No past surgical history on file.  Current Medications: No outpatient medications have been marked as taking for the 08/10/21 encounter (Appointment) with Dyann Kief, PA-C.     Allergies:   Patient has no known allergies.   Social History   Socioeconomic History   Marital status: Married    Spouse name: Bjorn Loser   Number of children: 2   Years of education: Land education level: Not on file  Occupational History   Not  on file  Tobacco Use   Smoking status: Every Day    Packs/day: 0.50    Types: Cigarettes    Start date: 1978   Smokeless tobacco: Never  Vaping Use   Vaping Use: Never used  Substance and Sexual Activity   Alcohol use: Yes    Alcohol/week: 20.0 standard drinks of alcohol    Types: 20 Cans of beer per week   Drug use: Not Currently   Sexual activity: Yes    Partners: Female    Birth control/protection: None  Other Topics Concern   Not on file  Social History Narrative   ** Merged History Encounter **       Social Determinants of Health   Financial Resource Strain: Not on file  Food Insecurity: Not on file  Transportation Needs: Not on file  Physical Activity: Not on file  Stress: Not on file  Social Connections: Not on file     Family History:  The patient's ***family history includes Hypertension in an other family member.   ROS:   Please see the history of present illness.    ROS All other systems reviewed and are negative.   PHYSICAL EXAM:   VS:  There were no vitals taken for this visit.  Physical Exam  GEN: Well nourished, well developed, in no acute distress  HEENT: normal  Neck: no JVD, carotid bruits, or masses Cardiac:RRR; no murmurs, rubs, or gallops  Respiratory:  clear to auscultation bilaterally, normal work of breathing GI: soft, nontender, nondistended, +  BS Ext: without cyanosis, clubbing, or edema, Good distal pulses bilaterally MS: no deformity or atrophy  Skin: warm and dry, no rash Neuro:  Alert and Oriented x 3, Strength and sensation are intact Psych: euthymic mood, full affect  Wt Readings from Last 3 Encounters:  06/30/21 272 lb (123.4 kg)  06/25/21 272 lb (123.4 kg)  06/01/21 268 lb (121.6 kg)      Studies/Labs Reviewed:   EKG:  EKG is*** ordered today.  The ekg ordered today demonstrates ***  Recent Labs: 06/10/2021: NT-Pro BNP 1,316 06/21/2021: B Natriuretic Peptide 77.0 06/22/2021: TSH 1.836 06/25/2021: Magnesium  2.0 07/07/2021: ALT 5; BUN 17; Creatinine, Ser 1.09; Hemoglobin 13.5; Platelets 512; Potassium 4.5; Sodium 138   Lipid Panel No results found for: "CHOL", "TRIG", "HDL", "CHOLHDL", "VLDL", "LDLCALC", "LDLDIRECT"  Additional studies/ records that were reviewed today include:  6/6 Echocardiogram Left Ventricle: LVEF= 60 to 65%. The left ventricle no WMA  -Left ventricular diastolic function could not be evaluated due to atrial fibrillation. 6/7 Carotid Doppler Right Carotid: Velocities in the right ICA are consistent with a 1-39% stenosis. Left Carotid: Velocities in the left ICA are consistent with a 1-39% stenosis. Vertebrals: Bilateral vertebral arteries demonstrate antegrade flow.      Risk Assessment/Calculations:   {Does this patient have ATRIAL FIBRILLATION?[phone]}     ASSESSMENT:    No diagnosis found.   PLAN:  In order of problems listed above:  PAF  COPD  HTN  ETOH  LOC/seizure disorder.  Noncompliance  Shared Decision Making/Informed Consent   {Are you ordering a CV Procedure (e.g. stress test, cath, DCCV, TEE, etc)?   Press F2        :[id number]}    Medication Adjustments/Labs and Tests Ordered: Current medicines are reviewed at length with the patient today.  Concerns regarding medicines are outlined above.  Medication changes, Labs and Tests ordered today are listed in the Patient Instructions below. There are no Patient Instructions on file for this visit.   Elson Clan, PA-C  07/27/2021 2:46 PM    [facility] [address] [place], [place], [place]  [address] Phone: [phone]; Fax: [phone]

## 2021-08-02 MED ORDER — LISINOPRIL 5 MG PO TABS: 5.0000 mg | ORAL_TABLET | Freq: Every day | ORAL | 0 refills | Status: DC

## 2021-08-02 MED ORDER — PANTOPRAZOLE SODIUM 40 MG PO TBEC: 40.0000 mg | DELAYED_RELEASE_TABLET | Freq: Every day | ORAL | 0 refills | Status: DC

## 2021-08-02 MED ORDER — THIAMINE HCL 100 MG PO TABS: 100.0000 mg | ORAL_TABLET | Freq: Every day | ORAL | 0 refills | Status: DC

## 2021-08-02 MED ORDER — FOLIC ACID 1 MG PO TABS: 1.0000 mg | ORAL_TABLET | Freq: Every day | ORAL | 0 refills | Status: DC

## 2021-08-02 MED ORDER — HYDRALAZINE HCL 50 MG PO TABS: 50.0000 mg | ORAL_TABLET | Freq: Three times a day (TID) | ORAL | 0 refills | Status: DC

## 2021-08-02 NOTE — Telephone Encounter (Signed)
Medications listed below in message. AMUCK

## 2021-08-02 NOTE — Telephone Encounter (Signed)
RX refill sent to pharmacy. AS, CMA 

## 2021-08-02 NOTE — Telephone Encounter (Signed)
Patient's wife called to check on getting patient's refills. [facility] - [place], [place] - [address] Phone:  [phone]  Fax:  [phone]

## 2021-08-02 NOTE — Telephone Encounter (Signed)
Please verify what rx refills they need. AS, CMA

## 2021-08-04 ENCOUNTER — Ambulatory Visit (INDEPENDENT_AMBULATORY_CARE_PROVIDER_SITE_OTHER): Payer: 59 | Admitting: Physician Assistant

## 2021-08-04 ENCOUNTER — Other Ambulatory Visit: Payer: Self-pay | Admitting: Physician Assistant

## 2021-08-04 VITALS — BP 150/83 | HR 71 | Temp 97.7°F | Ht 74.0 in | Wt 260.0 lb

## 2021-08-04 DIAGNOSIS — S81001A Unspecified open wound, right knee, initial encounter: Secondary | ICD-10-CM | POA: Diagnosis not present

## 2021-08-04 DIAGNOSIS — W19XXXA Unspecified fall, initial encounter: Secondary | ICD-10-CM | POA: Diagnosis not present

## 2021-08-04 DIAGNOSIS — I10 Essential (primary) hypertension: Secondary | ICD-10-CM

## 2021-08-04 DIAGNOSIS — R7989 Other specified abnormal findings of blood chemistry: Secondary | ICD-10-CM

## 2021-08-04 DIAGNOSIS — M7989 Other specified soft tissue disorders: Secondary | ICD-10-CM

## 2021-08-04 MED ORDER — METHYLPREDNISOLONE 4 MG PO TBPK: ORAL_TABLET | ORAL | 0 refills | Status: DC

## 2021-08-04 NOTE — Patient Instructions (Signed)

## 2021-08-04 NOTE — Progress Notes (Signed)
Established patient visit   Patient: Ronnie Dunn   DOB: 04-21-1960   61 y.o. Male  MRN: [id number] Visit Date: 08/04/2021  Chief Complaint  Patient presents with   Knee Injury   Subjective    HPI  Patient presents for follow-up hypertension. Patient had a fall this past Saturday. States fell on his knees and head landed on the chair. Patient's wife states she checked his blood pressure which was low (diastolic was in the 79K) and then was high. Patient states has chronic back pain. But since the fall his knees and arms have been hurting. Tylenol has not helped. Patient's blood pressure has been around 138-140/76 the last few days.  States his last alcoholic drink was last Friday. Patient had been without his blood pressure medications for several days, did not get his refills until 2 days ago. Patient reports completed his antibiotics for pneumonia. Denies fever or productive cough with purulent sputum. Shortness of breath at baseline.     Medications: Outpatient Medications Prior to Visit  Medication Sig   albuterol (VENTOLIN HFA) 108 (90 Base) MCG/ACT inhaler Inhale 2 puffs into the lungs every 4 (four) hours as needed.   atorvastatin (LIPITOR) 10 MG tablet Take 10 mg by mouth daily.   cetirizine (ZYRTEC) 10 MG tablet Take 10 mg by mouth daily.   dapagliflozin propanediol (FARXIGA) 10 MG TABS tablet Take 1 tablet (10 mg total) by mouth daily before breakfast.   doxazosin (CARDURA) 4 MG tablet Take 1 tablet (4 mg total) by mouth at bedtime.   famotidine (PEPCID) 40 MG tablet Take 40 mg by mouth daily.   Fluticasone-Umeclidin-Vilant (TRELEGY ELLIPTA) 100-62.5-25 MCG/INH AEPB Inhale 1 puff into the lungs daily.   folic acid (FOLVITE) 1 MG tablet Take 1 tablet (1 mg total) by mouth daily.   hydrALAZINE (APRESOLINE) 50 MG tablet Take 1 tablet (50 mg total) by mouth 3 (three) times daily.   lisinopril (ZESTRIL) 5 MG tablet Take 1 tablet (5 mg total) by mouth daily.   magnesium oxide  (MAG-OX) 400 MG tablet Take 400 mg by mouth daily.   metoprolol succinate (TOPROL-XL) 100 MG 24 hr tablet Take 150 mg by mouth daily.   pantoprazole (PROTONIX) 40 MG tablet Take 1 tablet (40 mg total) by mouth daily.   rivaroxaban (XARELTO) 20 MG TABS tablet Take 1 tablet (20 mg total) by mouth daily with supper.   spironolactone (ALDACTONE) 25 MG tablet Take 1 tablet (25 mg total) by mouth daily.   thiamine 100 MG tablet Take 1 tablet (100 mg total) by mouth daily.   traZODone (DESYREL) 50 MG tablet Take 50 mg by mouth daily as needed for sleep (anxiety).   [DISCONTINUED] azithromycin (ZITHROMAX) 250 MG tablet Take 1 tablet (250 mg total) by mouth daily.   [DISCONTINUED] cefdinir (OMNICEF) 300 MG capsule Take 1 capsule (300 mg total) by mouth every 12 (twelve) hours.   [DISCONTINUED] predniSONE (DELTASONE) 50 MG tablet Take 1 tablet (50 mg total) by mouth daily with breakfast.   No facility-administered medications prior to visit.    Review of Systems Review of Systems:  A fourteen system review of systems was performed and found to be positive as per HPI.  Last CBC Lab Results  Component Value Date   WBC 7.3 07/07/2021   HGB 13.5 07/07/2021   HCT 40.6 07/07/2021   MCV 98 (H) 07/07/2021   MCH 32.5 07/07/2021   RDW 13.1 07/07/2021   PLT 512 (H) 24/09/7351   Last metabolic  panel Lab Results  Component Value Date   GLUCOSE 145 (H) 07/07/2021   NA 138 07/07/2021   K 4.5 07/07/2021   CL 95 (L) 07/07/2021   CO2 27 07/07/2021   BUN 17 07/07/2021   CREATININE 1.09 07/07/2021   EGFR 77 07/07/2021   CALCIUM 9.5 07/07/2021   PHOS 3.4 06/25/2021   PROT 6.9 07/07/2021   ALBUMIN 3.7 (L) 07/07/2021   LABGLOB 3.2 07/07/2021   AGRATIO 1.2 07/07/2021   BILITOT 0.7 07/07/2021   ALKPHOS 119 07/07/2021   AST 11 07/07/2021   ALT 5 07/07/2021   ANIONGAP 9 06/25/2021   Last lipids No results found for: "CHOL", "HDL", "LDLCALC", "LDLDIRECT", "TRIG", "CHOLHDL" Last hemoglobin A1c Lab  Results  Component Value Date   HGBA1C 7.2 (H) 06/23/2021   Last thyroid functions Lab Results  Component Value Date   TSH 1.836 06/22/2021   Last vitamin D No results found for: "25OHVITD2", "25OHVITD3", "VD25OH"   Objective    BP (!) 150/83   Pulse 71   Temp 97.7 F (36.5 C)   Ht 6' 2"  (1.88 m)   Wt 260 lb (117.9 kg)   SpO2 97%   BMI 33.38 kg/m  BP Readings from Last 3 Encounters:  08/04/21 (!) 150/83  06/30/21 136/84  06/25/21 (!) 159/79   Wt Readings from Last 3 Encounters:  08/04/21 260 lb (117.9 kg)  06/30/21 272 lb (123.4 kg)  06/25/21 272 lb (123.4 kg)    Physical Exam  General:  Pleasant and cooperative, appropriate for stated age.  Neuro:  Alert and oriented,  extra-ocular muscles intact  HEENT:  Normocephalic, atraumatic, neck supple  Skin:  small open wound over anterior right knee without erythema, serosanguinous drainage noted, small hematoma over right elbow  Cardiac:  IRR Respiratory: Wheezing and rhonchi (chronic), no rales or crackles.  MSK: right pinky finger is swollen and bruised Vascular:  Ext warm, no cyanosis apprec.; cap RF less 2 sec. Psych:  No HI/SI, judgement and insight good, Euthymic mood. Full Affect.   No results found for any visits on 08/04/21.  Assessment & Plan      Problem List Items Addressed This Visit       Cardiovascular and Mediastinum   Essential hypertension - Primary   Other Visit Diagnoses     Fall, initial encounter       Relevant Medications   methylPREDNISolone (MEDROL DOSEPAK) 4 MG TBPK tablet   Swollen finger       Open wound of right knee, initial encounter          Hypertension: -BP elevated in office today. Patient's wife states he recently took a dose of hydralazine. Recommend to continue checking BP at home and if BP consistently >140/90 then recommend treatment adjustments. Advised in the future if medication refills are not sent within 48 hrs should contact the office again. Continue current  medication regimen. Follow-up as scheduled to reassess BP and medication therapy.  Fall, initial encounter: -Discussed with patient recommend obtaining x-ray of right pinky finger due to significant swelling. Pt deferred at this time. Recommend RICE therapy including applying ice. Discussed wound care including proper cleaning and topical antibiotic. Advised to return if wound fails to improve or worsens.  -Discussed with patient management options and recommend to continue with Tylenol. Do not recommend NSAIDs with anticoagulant therapy. Will send rx for steroid taper to help reduce inflammation. Discussed referral to pain management for chronic pain, pt deferred at this time.   Return  for as scheduled.        [name], PA-C  [facility] [phone] (phone) [phone] (fax)  [place]

## 2021-08-09 ENCOUNTER — Other Ambulatory Visit: Payer: Self-pay

## 2021-08-09 ENCOUNTER — Other Ambulatory Visit: Payer: 59

## 2021-08-09 DIAGNOSIS — I5032 Chronic diastolic (congestive) heart failure: Secondary | ICD-10-CM

## 2021-08-09 DIAGNOSIS — R7989 Other specified abnormal findings of blood chemistry: Secondary | ICD-10-CM

## 2021-08-09 DIAGNOSIS — R600 Localized edema: Secondary | ICD-10-CM

## 2021-08-09 NOTE — Telephone Encounter (Signed)
Called pt and spoke with pt's wife informing them per Dr. Debby Bud nurse for pt to contact PCP for a refill for spironolactone, since PCP increased the medication. Pt's wife verbalized understanding.

## 2021-08-09 NOTE — Telephone Encounter (Signed)
Pt's wife calling requesting a refill on spironolactone 25 mg tablets. Medication was increased to 1 tablet daily by PCP. Would Dr. Izora Ribas l;ike to refill this medication? Please address

## 2021-08-10 ENCOUNTER — Ambulatory Visit: Payer: 59 | Admitting: Physician Assistant

## 2021-08-10 LAB — CBC
Hematocrit: 44.3 % (ref 37.5–51.0)
Hemoglobin: 15 g/dL (ref 13.0–17.7)
MCH: 33.2 pg — ABNORMAL HIGH (ref 26.6–33.0)
MCHC: 33.9 g/dL (ref 31.5–35.7)
MCV: 98 fL — ABNORMAL HIGH (ref 79–97)
Platelets: 593 10*3/uL — ABNORMAL HIGH (ref 150–450)
RBC: 4.52 x10E6/uL (ref 4.14–5.80)
RDW: 13.3 % (ref 11.6–15.4)
WBC: 7 10*3/uL (ref 3.4–10.8)

## 2021-08-12 ENCOUNTER — Telehealth: Payer: Self-pay | Admitting: Internal Medicine

## 2021-08-12 ENCOUNTER — Other Ambulatory Visit: Payer: Self-pay

## 2021-08-12 DIAGNOSIS — R600 Localized edema: Secondary | ICD-10-CM

## 2021-08-12 DIAGNOSIS — I5032 Chronic diastolic (congestive) heart failure: Secondary | ICD-10-CM

## 2021-08-12 MED ORDER — SPIRONOLACTONE 25 MG PO TABS: 25.0000 mg | ORAL_TABLET | Freq: Every day | ORAL | 3 refills | Status: DC

## 2021-08-12 NOTE — Telephone Encounter (Addendum)
   Patient Name: Ronnie Dunn  DOB: [birth year] MRN: [id number]  Primary Cardiologist: Christell Constant, MD  Chart reviewed as part of pre-operative protocol coverage.   Patient was previously cleared for procedure by Dr. Izora Ribas. However, patient has been hospitalized since this time.The patient has an upcoming visit scheduled with Herma Carson, PA on 08/18/2021 at which time clearance can be addressed in case there are any issues that would impact surgical recommendations.  Colonoscopy is not scheduled until 08/25/2021 as below. I added preop FYI to appointment note so that provider is aware to address at time of outpatient visit.  Per office protocol the cardiology provider should forward their finalize clearance decision and recommendations regarding antiplatelet therapy to the requesting party below.    I will route this message as FYI to requesting party and remove this message from the preop box as separate preop APP input not needed at this time.   Please call with any questions.  Joylene Grapes, NP  08/12/2021, 12:45 PM

## 2021-08-12 NOTE — Telephone Encounter (Signed)
Returned the call to pt's wife, she has been updated with the pt and his surgical clearance.

## 2021-08-12 NOTE — Telephone Encounter (Signed)
Pt wife called about on update for pt upcoming colonoscopy. She states his procedure is scheduled for 08/25/21. She made pt a 64mon f/u from Dr. Edwyna Perfect last visit for 08/17/21.

## 2021-08-16 NOTE — Progress Notes (Unsigned)
Cardiology Office Note    Date:  08/18/2021   ID:  CRAY MONNIN, DOB Jun 09, 1960, MRN [id number]   PCP:  Mayer Masker, Cordelia Poche    [facility]  Cardiologist:  Christell Constant, MD   Advanced Practice Provider:  No care team member to display Electrophysiologist:  None   [phone]   Chief Complaint  Patient presents with   Hospitalization Follow-up    History of Present Illness:  Ronnie Dunn is a 61 y.o. male with a hx of PAF, COPD, still actively smoking, blood in stool, HTN with DM, alcohol use, long history of passing out after he started mixing chemicals together. Found to have low sodium and seizures.           .  Patient saw Dr. Izora Ribas 06/01/21 and cleared for surgery. Ordered echo 06/22/21-normal LVEF 60-65% no WMA, mildly dilated Ao root 40 mm, ascending aorta 42 mm and increased farxiga and added metolazone 2.5 mg weekly.  Patient discharged 06/22/21 multilobular PNA/CAP, noncompliant with meds at home and hospital. Had LOC ? Secondary to seizure-not orthostatic.   Patient's colonoscopy/endoscopy is scheduled 08/25/21. Patient comes in with his wife. Says he passed out 3 weeks ago because he ran out of his BP meds. He was sitting on a stool and ask for a gatorade and hit the floor. BP initially low and then came back up. Says his BP was running high. They don't think he had a seizure because he didn't have shaking of arms or loss of bowel/bladder like he has in the past. He says he stopped drinking beer for 3 weeks and he think that may have caused it. Back to drinking 4-5 beers daily. Smoking 1 ppd down from 2 ppd.    Past Medical History:  Diagnosis Date   Chronic heart failure with preserved ejection fraction (HCC) 06/01/2021   Chronic obstructive pulmonary disease (HCC) 05/28/2021   Class 1 obesity 03/29/2020   Essential hypertension 03/29/2020   Gastroesophageal reflux disease 05/28/2021   Hyponatremia 03/29/2020   OSA (obstructive  sleep apnea) 06/01/2021   Permanent atrial fibrillation (HCC) 05/28/2021   Thoracic aortic aneurysm without rupture (HCC) 06/01/2021   Type 2 diabetes mellitus without complication, without long-term current use of insulin (HCC) 05/28/2021    No past surgical history on file.  Current Medications: Current Meds  Medication Sig   albuterol (VENTOLIN HFA) 108 (90 Base) MCG/ACT inhaler Inhale 2 puffs into the lungs every 4 (four) hours as needed.   atorvastatin (LIPITOR) 10 MG tablet Take 10 mg by mouth daily.   cetirizine (ZYRTEC) 10 MG tablet Take 10 mg by mouth daily.   dapagliflozin propanediol (FARXIGA) 10 MG TABS tablet Take 1 tablet (10 mg total) by mouth daily before breakfast.   famotidine (PEPCID) 40 MG tablet Take 40 mg by mouth daily.   Fluticasone-Umeclidin-Vilant (TRELEGY ELLIPTA) 100-62.5-25 MCG/INH AEPB Inhale 1 puff into the lungs daily.   folic acid (FOLVITE) 1 MG tablet Take 1 tablet (1 mg total) by mouth daily.   hydrALAZINE (APRESOLINE) 50 MG tablet Take 1 tablet (50 mg total) by mouth 3 (three) times daily.   lisinopril (ZESTRIL) 5 MG tablet Take 1 tablet (5 mg total) by mouth daily.   magnesium oxide (MAG-OX) 400 MG tablet Take 400 mg by mouth daily.   metoprolol succinate (TOPROL-XL) 100 MG 24 hr tablet Take 150 mg by mouth daily.   pantoprazole (PROTONIX) 40 MG tablet Take 1 tablet (40 mg total) by mouth  daily.   rivaroxaban (XARELTO) 20 MG TABS tablet Take 1 tablet (20 mg total) by mouth daily with supper.   spironolactone (ALDACTONE) 25 MG tablet Take 1 tablet (25 mg total) by mouth daily.   thiamine 100 MG tablet Take 1 tablet (100 mg total) by mouth daily.   traZODone (DESYREL) 50 MG tablet Take 50 mg by mouth daily as needed for sleep (anxiety).     Allergies:   Patient has no known allergies.   Social History   Socioeconomic History   Marital status: Married    Spouse name: Bjorn Loser   Number of children: 2   Years of education: Careers adviser education level: Not on file  Occupational History   Not on file  Tobacco Use   Smoking status: Every Day    Packs/day: 0.50    Types: Cigarettes    Start date: 1978   Smokeless tobacco: Never  Vaping Use   Vaping Use: Never used  Substance and Sexual Activity   Alcohol use: Yes    Alcohol/week: 20.0 standard drinks of alcohol    Types: 20 Cans of beer per week   Drug use: Not Currently   Sexual activity: Yes    Partners: Female    Birth control/protection: None  Other Topics Concern   Not on file  Social History Narrative   ** Merged History Encounter **       Social Determinants of Health   Financial Resource Strain: Not on file  Food Insecurity: Not on file  Transportation Needs: Not on file  Physical Activity: Not on file  Stress: Not on file  Social Connections: Not on file     Family History:  The patient's  family history includes Hypertension in an other family member.   ROS:   Please see the history of present illness.    ROS All other systems reviewed and are negative.   PHYSICAL EXAM:   VS:  BP 130/80 (BP Location: Left Arm, Patient Position: Sitting, Cuff Size: Normal)   Pulse 71   Ht 6\' 2"  (1.88 m)   Wt 253 lb (114.8 kg)   BMI 32.48 kg/m   Physical Exam  GEN: Obese, in no acute distress  Neck: no JVD, carotid bruits, or masses Cardiac:RRR; no murmurs, rubs, or gallops  Respiratory:  clear to auscultation bilaterally, normal work of breathing GI: soft, nontender, nondistended, + BS Ext: without cyanosis, clubbing, or edema, Good distal pulses bilaterally Neuro:  Alert and Oriented x 3,  Psych: euthymic mood, full affect  Wt Readings from Last 3 Encounters:  08/18/21 253 lb (114.8 kg)  08/04/21 260 lb (117.9 kg)  06/30/21 272 lb (123.4 kg)      Studies/Labs Reviewed:   EKG:  EKG is  ordered today.  The ekg ordered today demonstrates Afib 71/m nonspecific ST changes  Recent Labs: 06/10/2021: NT-Pro BNP 1,316 06/21/2021: B  Natriuretic Peptide 77.0 06/22/2021: TSH 1.836 06/25/2021: Magnesium 2.0 07/07/2021: ALT 5; BUN 17; Creatinine, Ser 1.09; Potassium 4.5; Sodium 138 08/09/2021: Hemoglobin 15.0; Platelets 593   Lipid Panel No results found for: "CHOL", "TRIG", "HDL", "CHOLHDL", "VLDL", "LDLCALC", "LDLDIRECT"  Additional studies/ records that were reviewed today include:  6/6 Echocardiogram Left Ventricle: LVEF= 60 to 65%. The left ventricle no WMA  -Left ventricular diastolic function could not be evaluated due to atrial fibrillation. 6/7 Carotid Doppler Right Carotid: Velocities in the right ICA are consistent with a 1-39% stenosis. Left Carotid: Velocities in the left ICA are  consistent with a 1-39% stenosis. Vertebrals: Bilateral vertebral arteries demonstrate antegrade flow.      Risk Assessment/Calculations:    CHA2DS2-VASc Score = 2   This indicates a 2.2% annual risk of stroke. The patient's score is based upon: CHF History: 0 HTN History: 1 Diabetes History: 1 Stroke History: 0 Vascular Disease History: 0 Age Score: 0 Gender Score: 0        ASSESSMENT:    1. Preoperative clearance   2. Paroxysmal atrial fibrillation (HCC)   3. Other emphysema ([place])   4. Essential hypertension   5. Alcohol use disorder, moderate, dependence ([place])   6. Loss of consciousness ([place])   7. Noncompliance with medication regimen      PLAN:  In order of problems listed above:  Preop clearance for colonoscopy/endoscopy 08/25/21 by Dr. Otis Brace and PA-C Bayley McMichael, [facility]. Will have pharmacy weigh in on Xarelto hold. Patient at higher risk based on COPD, seizures and multiple medical problems with ETOH and history of noncompliance . He has recurrent LOC of unknown etiology.Discussed with Dr. Gasper Sells and with ongoing GI blood loss he needs above procedures and can proceed from our standpoint knowing he's at higher risk. Will check labs today.  According to the Revised Cardiac Risk  Index (RCRI), his Perioperative Risk of Major Cardiac Event is (%): 0.9  His Functional Capacity in METs is: 4.64 according to the [facility] Activity Status Index (DASI).   PAF seems to be persistent at this point  COPD  HTN controlled  ETOH-quit drinking for 3 weeks but back to drinking 4-5 beers daily  LOC/seizure disorder. Patient has had recurrent LOC-most recent 3 weeks ago after running out of BP meds. Wife says no seizure activity and came around quickly. Not orthostatic in hospital.   Noncompliance  Shared Decision Making/Informed Consent        Medication Adjustments/Labs and Tests Ordered: Current medicines are reviewed at length with the patient today.  Concerns regarding medicines are outlined above.  Medication changes, Labs and Tests ordered today are listed in the Patient Instructions below. Patient Instructions  Medication Instructions:   Your physician recommends that you continue on your current medications as directed. Please refer to the Current Medication list given to you today.   *If you need a refill on your cardiac medications before your next appointment, please call your pharmacy*   Lab Work: BMET CBC AND BNP TODAY    If you have labs (blood work) drawn today and your tests are completely normal, you will receive your results only by: [place] (if you have MyChart) OR A paper copy in the mail If you have any lab test that is abnormal or we need to change your treatment, we will call you to review the results.   Testing/Procedures: NONE ORDERED  TODAY     Follow-Up: At [facility], you and your health needs are our priority.  As part of our continuing mission to provide you with exceptional heart care, we have created designated Provider Care Teams.  These Care Teams include your primary Cardiologist (physician) and Advanced Practice Providers (APPs -  Physician Assistants and Nurse Practitioners) who all work together to provide you with  the care you need, when you need it.  We recommend signing up for the patient portal called "MyChart".  Sign up information is provided on this After Visit Summary.  MyChart is used to connect with patients for Virtual Visits (Telemedicine).  Patients are able to view lab/test results,  encounter notes, upcoming appointments, etc.  Non-urgent messages can be sent to your provider as well.   To learn more about what you can do with MyChart, go to [email or web address].    Your next appointment:   3-4  month(s)  The format for your next appointment:   In Person  Provider:   Werner Lean, MD     Other Instructions   Important Information About Sugar         Sumner Boast, PA-C  08/18/2021 11:39 AM    [address], [place], [place]  [address] Phone: [phone]; Fax: [phone]

## 2021-08-18 ENCOUNTER — Ambulatory Visit: Payer: 59 | Admitting: Physician Assistant

## 2021-08-18 ENCOUNTER — Encounter: Payer: Self-pay | Admitting: Physician Assistant

## 2021-08-18 VITALS — BP 130/80 | HR 71 | Ht 74.0 in | Wt 253.0 lb

## 2021-08-18 DIAGNOSIS — J438 Other emphysema: Secondary | ICD-10-CM | POA: Diagnosis not present

## 2021-08-18 DIAGNOSIS — I10 Essential (primary) hypertension: Secondary | ICD-10-CM

## 2021-08-18 DIAGNOSIS — Z01818 Encounter for other preprocedural examination: Secondary | ICD-10-CM | POA: Diagnosis not present

## 2021-08-18 DIAGNOSIS — I480 Paroxysmal atrial fibrillation: Secondary | ICD-10-CM | POA: Diagnosis not present

## 2021-08-18 DIAGNOSIS — Z91148 Patient's other noncompliance with medication regimen for other reason: Secondary | ICD-10-CM

## 2021-08-18 DIAGNOSIS — F1020 Alcohol dependence, uncomplicated: Secondary | ICD-10-CM

## 2021-08-18 DIAGNOSIS — R4020 Unspecified coma: Secondary | ICD-10-CM

## 2021-08-18 NOTE — Patient Instructions (Addendum)
Medication Instructions:   Your physician recommends that you continue on your current medications as directed. Please refer to the Current Medication list given to you today.   *If you need a refill on your cardiac medications before your next appointment, please call your pharmacy*   Lab Work: BMET CBC AND BNP TODAY    If you have labs (blood work) drawn today and your tests are completely normal, you will receive your results only by: MyChart Message (if you have MyChart) OR A paper copy in the mail If you have any lab test that is abnormal or we need to change your treatment, we will call you to review the results.   Testing/Procedures: NONE ORDERED  TODAY     Follow-Up: At [facility], you and your health needs are our priority.  As part of our continuing mission to provide you with exceptional heart care, we have created designated Provider Care Teams.  These Care Teams include your primary Cardiologist (physician) and Advanced Practice Providers (APPs -  Physician Assistants and Nurse Practitioners) who all work together to provide you with the care you need, when you need it.  We recommend signing up for the patient portal called "MyChart".  Sign up information is provided on this After Visit Summary.  MyChart is used to connect with patients for Virtual Visits (Telemedicine).  Patients are able to view lab/test results, encounter notes, upcoming appointments, etc.  Non-urgent messages can be sent to your provider as well.   To learn more about what you can do with MyChart, go to [email or web address].    Your next appointment:   3-4  month(s)  The format for your next appointment:   In Person  Provider:   Christell Constant, MD     Other Instructions   Important Information About Sugar

## 2021-08-20 LAB — BASIC METABOLIC PANEL
BUN/Creatinine Ratio: 17 (ref 10–24)
BUN: 16 mg/dL (ref 8–27)
CO2: 18 mmol/L — ABNORMAL LOW (ref 20–29)
Calcium: 9.7 mg/dL (ref 8.6–10.2)
Chloride: 95 mmol/L — ABNORMAL LOW (ref 96–106)
Creatinine, Ser: 0.94 mg/dL (ref 0.76–1.27)
Glucose: 125 mg/dL — ABNORMAL HIGH (ref 70–99)
Potassium: 5.1 mmol/L (ref 3.5–5.2)
Sodium: 133 mmol/L — ABNORMAL LOW (ref 134–144)
eGFR: 92 mL/min/{1.73_m2} (ref >59)

## 2021-08-20 LAB — CBC
Hematocrit: 45.8 % (ref 37.5–51.0)
Hemoglobin: 15.4 g/dL (ref 13.0–17.7)
MCH: 32.8 pg (ref 26.6–33.0)
MCHC: 33.6 g/dL (ref 31.5–35.7)
MCV: 98 fL — ABNORMAL HIGH (ref 79–97)
Platelets: 512 10*3/uL — ABNORMAL HIGH (ref 150–450)
RBC: 4.69 x10E6/uL (ref 4.14–5.80)
RDW: 13.8 % (ref 11.6–15.4)
WBC: 6.8 10*3/uL (ref 3.4–10.8)

## 2021-08-20 LAB — PRO B NATRIURETIC PEPTIDE: NT-Pro BNP: 1297 pg/mL — ABNORMAL HIGH (ref 0–210)

## 2021-08-20 NOTE — Telephone Encounter (Signed)
Will forward to pre op pool for review by pre op app

## 2021-08-20 NOTE — Telephone Encounter (Signed)
Pt wife is calling because they have until Monday morning 08/23/21 for the final clearance form to be faxed over to the office or they will have to cancel it. She states she would also like a call when this is finished, please advise.

## 2021-08-30 ENCOUNTER — Ambulatory Visit: Payer: 59 | Admitting: Nurse Practitioner

## 2021-09-06 ENCOUNTER — Telehealth: Payer: Self-pay | Admitting: Physician Assistant

## 2021-09-06 MED ORDER — ATORVASTATIN CALCIUM 10 MG PO TABS: 10.0000 mg | ORAL_TABLET | Freq: Every day | ORAL | 0 refills | Status: DC

## 2021-09-06 NOTE — Telephone Encounter (Signed)
Per Kandis Cocking, RX refill sent to pharmacy. AS, CMA

## 2021-09-06 NOTE — Telephone Encounter (Signed)
Patient requesting a refill of atorvastatin 10 mg for a 90 day supply. Please advise.

## 2021-09-16 ENCOUNTER — Telehealth: Payer: Self-pay | Admitting: Physician Assistant

## 2021-09-16 DIAGNOSIS — J449 Chronic obstructive pulmonary disease, unspecified: Secondary | ICD-10-CM

## 2021-09-16 MED ORDER — TRELEGY ELLIPTA 100-62.5-25 MCG/ACT IN AEPB: 1.0000 | INHALATION_SPRAY | Freq: Every day | RESPIRATORY_TRACT | 4 refills | Status: DC

## 2021-09-16 NOTE — Telephone Encounter (Signed)
Patient requesting refill of Trelegy. Please advise.

## 2021-10-06 ENCOUNTER — Ambulatory Visit: Payer: 59 | Admitting: Physician Assistant

## 2021-10-26 ENCOUNTER — Other Ambulatory Visit: Payer: Self-pay | Admitting: Physician Assistant

## 2021-11-01 ENCOUNTER — Other Ambulatory Visit: Payer: Self-pay | Admitting: Physician Assistant

## 2021-11-01 MED ORDER — HYDRALAZINE HCL 50 MG PO TABS: 50.0000 mg | ORAL_TABLET | Freq: Three times a day (TID) | ORAL | 0 refills | Status: DC

## 2021-12-13 ENCOUNTER — Telehealth: Payer: Self-pay

## 2021-12-13 NOTE — Telephone Encounter (Signed)
**Note De-Identified Dinah Lupa Obfuscation** Xarelto PA started through covermymeds. Key: [id number]

## 2021-12-20 ENCOUNTER — Encounter: Payer: Self-pay | Admitting: Internal Medicine

## 2021-12-20 ENCOUNTER — Telehealth: Payer: Self-pay

## 2021-12-20 ENCOUNTER — Ambulatory Visit: Payer: Medicaid Other | Attending: Internal Medicine | Admitting: Internal Medicine

## 2021-12-20 VITALS — BP 122/70 | HR 78 | Ht 74.0 in | Wt 270.0 lb

## 2021-12-20 DIAGNOSIS — J438 Other emphysema: Secondary | ICD-10-CM

## 2021-12-20 DIAGNOSIS — E669 Obesity, unspecified: Secondary | ICD-10-CM

## 2021-12-20 DIAGNOSIS — G4733 Obstructive sleep apnea (adult) (pediatric): Secondary | ICD-10-CM | POA: Diagnosis not present

## 2021-12-20 DIAGNOSIS — R400 Somnolence: Secondary | ICD-10-CM | POA: Diagnosis not present

## 2021-12-20 DIAGNOSIS — I480 Paroxysmal atrial fibrillation: Secondary | ICD-10-CM

## 2021-12-20 DIAGNOSIS — I4821 Permanent atrial fibrillation: Secondary | ICD-10-CM

## 2021-12-20 DIAGNOSIS — F1020 Alcohol dependence, uncomplicated: Secondary | ICD-10-CM

## 2021-12-20 NOTE — Telephone Encounter (Signed)
Patient given [facility] Sleep study device. Patient did not have phone with him to download app but will once he gets home. Reviewed instructions. Waiver signed. Patient aware that we will call once we receive prior auth to let him know if/when he can proceed with study. Device registered to [employer].

## 2021-12-20 NOTE — Progress Notes (Signed)
Cardiology Office Note:    Date:  12/20/2021   ID:  Ronnie Dunn, DOB [birth year], MRN [id number]  PCP:  Mayer MaskerAbonza, Maritza, PA-C   [facility] Providers Cardiologist:  Christell ConstantMahesh A Romonda Parker, MD     Referring MD: Mayer MaskerAbonza, Maritza, PA-C   CC: HFpEF  History of Present Illness:    Ronnie Dunn is a 61 y.o. male with a hx of PAF, COPD, still actively smoking, blood in stool, HTN with DM, alcohol use, OSA, who presents for evaluation 06/01/21. 2023: Preoperative low risk.  Patient notes that his procedure was stopped because anesthesia wanted to go to the hospital for it. Nor more blood in his stool.   Feels tied a lot.  Is largely sedentary per wife..   Leg swelling is much better.  Wife notes he has chest pain; patient denies.   No SOB/DOE and no PND/Orthopnea.  No palpitations or syncope. He is tired all the time. Wife notes DOE.  Cut down to 7 beers a day. Rarely doesn't drink at all.    Past Medical History:  Diagnosis Date   Chronic heart failure with preserved ejection fraction (HCC) 06/01/2021   Chronic obstructive pulmonary disease (HCC) 05/28/2021   Class 1 obesity 03/29/2020   Essential hypertension 03/29/2020   Gastroesophageal reflux disease 05/28/2021   Hyponatremia 03/29/2020   OSA (obstructive sleep apnea) 06/01/2021   Permanent atrial fibrillation (HCC) 05/28/2021   Thoracic aortic aneurysm without rupture (HCC) 06/01/2021   Type 2 diabetes mellitus without complication, without long-term current use of insulin (HCC) 05/28/2021    No past surgical history on file.  Current Medications: Current Meds  Medication Sig   albuterol (VENTOLIN HFA) 108 (90 Base) MCG/ACT inhaler Inhale 2 puffs into the lungs every 4 (four) hours as needed.   ascorbic acid (VITAMIN C) 500 MG tablet Take 500 mg by mouth daily.   atorvastatin (LIPITOR) 10 MG tablet Take 1 tablet (10 mg total) by mouth daily.   cetirizine (ZYRTEC) 10 MG tablet Take 10 mg by mouth daily.    Cholecalciferol (VITAMIN D3) 50 MCG (2000 UT) TABS Take by mouth daily at 6 (six) AM.   dapagliflozin propanediol (FARXIGA) 10 MG TABS tablet Take 1 tablet (10 mg total) by mouth daily before breakfast.   famotidine (PEPCID) 40 MG tablet Take 40 mg by mouth daily.   Fluticasone-Umeclidin-Vilant (TRELEGY ELLIPTA) 100-62.5-25 MCG/ACT AEPB Inhale 1 puff into the lungs daily.   folic acid (FOLVITE) 1 MG tablet Take 1 tablet by mouth once daily   hydrALAZINE (APRESOLINE) 50 MG tablet Take 1 tablet (50 mg total) by mouth 3 (three) times daily.   lisinopril (ZESTRIL) 5 MG tablet Take 1 tablet by mouth once daily   magnesium oxide (MAG-OX) 400 MG tablet Take 400 mg by mouth daily.   metoprolol succinate (TOPROL-XL) 100 MG 24 hr tablet Take 150 mg by mouth daily.   pantoprazole (PROTONIX) 40 MG tablet Take 1 tablet by mouth once daily   rivaroxaban (XARELTO) 20 MG TABS tablet Take 1 tablet (20 mg total) by mouth daily with supper.   spironolactone (ALDACTONE) 25 MG tablet Take 1 tablet (25 mg total) by mouth daily.   thiamine 100 MG tablet Take 1 tablet (100 mg total) by mouth daily.   traZODone (DESYREL) 50 MG tablet Take 50 mg by mouth daily as needed for sleep (anxiety).   Turmeric (QC TUMERIC COMPLEX) 500 MG CAPS Take by mouth daily at 6 (six) AM.   vitamin E 180 MG (  400 UNITS) capsule Take 400 Units by mouth daily.   zinc gluconate 50 MG tablet Take 50 mg by mouth daily.     Allergies:   Patient has no known allergies.   Social History   Socioeconomic History   Marital status: Married    Spouse name: Bjorn Loser   Number of children: 2   Years of education: Land education level: Not on file  Occupational History   Not on file  Tobacco Use   Smoking status: Every Day    Packs/day: 0.50    Types: Cigarettes    Start date: 1978   Smokeless tobacco: Never  Vaping Use   Vaping Use: Never used  Substance and Sexual Activity   Alcohol use: Yes    Alcohol/week: 20.0  standard drinks of alcohol    Types: 20 Cans of beer per week   Drug use: Not Currently   Sexual activity: Yes    Partners: Female    Birth control/protection: None  Other Topics Concern   Not on file  Social History Narrative   ** Merged History Encounter **       Social Determinants of Health   Financial Resource Strain: Not on file  Food Insecurity: Not on file  Transportation Needs: Not on file  Physical Activity: Not on file  Stress: Not on file  Social Connections: Not on file    Social: 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  [employer]  Family History: Brother has atrial fibrillation. Mother has pacemaker  ROS:   Please see the history of present illness.     All other systems reviewed and are negative.  EKGs/Labs/Other Studies Reviewed:    The following studies were reviewed today:  EKG:   12/20/21: atrial fibrillation 08/18/21: Atrial fibrillation 06/01/21:  AF rates 72   Cardiac Studies & Procedures       ECHOCARDIOGRAM  ECHOCARDIOGRAM COMPLETE 06/22/2021  Narrative ECHOCARDIOGRAM REPORT    Patient Name:   Ronnie Dunn Date of Exam: 06/22/2021 Medical Rec #:  [id number]     Height:       74.0 in Accession #:    [id number]    Weight:       266.8 lb Date of Birth:  03-22-1960     BSA:          2.456 m Patient Age:    60 years      BP:           126/66 mmHg Patient Gender: M             HR:           88 bpm. Exam Location:  Inpatient  Procedure: 2D Echo, Cardiac Doppler and Color Doppler  Indications:    Afib  History:        Patient has prior history of Echocardiogram examinations, most recent 03/29/2020. CHF, COPD; Risk Factors:Hypertension and Diabetes.  Sonographer:    Rodrigo Ran RCS Referring Phys: [id number] DAVID MANUEL ORTIZ   Sonographer Comments: Patient is morbidly obese. IMPRESSIONS   1. Left ventricular ejection fraction, by estimation, is 60 to 65%. The left ventricle has normal function. The  left ventricle has no regional wall motion abnormalities. There is mild concentric left ventricular hypertrophy. Left ventricular diastolic function could not be evaluated. 2. Right ventricular systolic function is normal. The right ventricular size is normal. Tricuspid regurgitation signal is inadequate for assessing PA pressure. 3. The mitral valve  is grossly normal. Trivial mitral valve regurgitation. No evidence of mitral stenosis. 4. The aortic valve is tricuspid. Aortic valve regurgitation is not visualized. No aortic stenosis is present. 5. Aortic dilatation noted. There is mild dilatation of the aortic root, measuring 40 mm. There is mild dilatation of the ascending aorta, measuring 42 mm. 6. The inferior vena cava is dilated in size with >50% respiratory variability, suggesting right atrial pressure of 8 mmHg.  FINDINGS Left Ventricle: Left ventricular ejection fraction, by estimation, is 60 to 65%. The left ventricle has normal function. The left ventricle has no regional wall motion abnormalities. The left ventricular internal cavity size was normal in size. There is mild concentric left ventricular hypertrophy. Left ventricular diastolic function could not be evaluated due to atrial fibrillation. Left ventricular diastolic function could not be evaluated.  Right Ventricle: The right ventricular size is normal. No increase in right ventricular wall thickness. Right ventricular systolic function is normal. Tricuspid regurgitation signal is inadequate for assessing PA pressure.  Left Atrium: Left atrial size was normal in size.  Right Atrium: Right atrial size was normal in size.  Pericardium: There is no evidence of pericardial effusion. Presence of epicardial fat layer.  Mitral Valve: The mitral valve is grossly normal. Trivial mitral valve regurgitation. No evidence of mitral valve stenosis.  Tricuspid Valve: The tricuspid valve is grossly normal. Tricuspid valve regurgitation is  trivial. No evidence of tricuspid stenosis.  Aortic Valve: The aortic valve is tricuspid. Aortic valve regurgitation is not visualized. No aortic stenosis is present. Aortic valve mean gradient measures 5.0 mmHg. Aortic valve peak gradient measures 9.1 mmHg. Aortic valve area, by VTI measures 2.69 cm.  Pulmonic Valve: The pulmonic valve was grossly normal. Pulmonic valve regurgitation is not visualized. No evidence of pulmonic stenosis.  Aorta: Aortic dilatation noted. There is mild dilatation of the aortic root, measuring 40 mm. There is mild dilatation of the ascending aorta, measuring 42 mm.  Venous: The inferior vena cava is dilated in size with greater than 50% respiratory variability, suggesting right atrial pressure of 8 mmHg.  IAS/Shunts: The atrial septum is grossly normal.   LEFT VENTRICLE PLAX 2D LVIDd:         4.80 cm   Diastology LVIDs:         3.10 cm   LV e' medial:   9.57 cm/s LV PW:         1.50 cm   LV E/e' medial: 11.8 LV IVS:        1.40 cm LVOT diam:     2.20 cm LV SV:         71 LV SV Index:   29 LVOT Area:     3.80 cm   RIGHT VENTRICLE             IVC RV Basal diam:  3.60 cm     IVC diam: 2.10 cm RV Mid diam:    2.00 cm RV S prime:     14.80 cm/s TAPSE (M-mode): 2.5 cm  LEFT ATRIUM             Index        RIGHT ATRIUM           Index LA diam:        3.60 cm 1.47 cm/m   RA Area:     21.80 cm LA Vol (A2C):   99.0 ml 40.30 ml/m  RA Volume:   64.50 ml  26.26 ml/m LA Vol (A4C):  54.6 ml 22.23 ml/m LA Biplane Vol: 74.6 ml 30.37 ml/m AORTIC VALVE AV Area (Vmax):    2.45 cm AV Area (Vmean):   2.54 cm AV Area (VTI):     2.69 cm AV Vmax:           151.00 cm/s AV Vmean:          109.000 cm/s AV VTI:            0.263 m AV Peak Grad:      9.1 mmHg AV Mean Grad:      5.0 mmHg LVOT Vmax:         97.50 cm/s LVOT Vmean:        72.700 cm/s LVOT VTI:          0.186 m LVOT/AV VTI ratio: 0.71  AORTA Ao Root diam: 4.00 cm Ao Asc diam:  4.20  cm  MITRAL VALVE MV Area (PHT): 5.54 cm     SHUNTS MV Decel Time: 137 msec     Systemic VTI:  0.19 m MV E velocity: 113.00 cm/s  Systemic Diam: 2.20 cm  Lennie Odor MD Electronically signed by Lennie Odor MD Signature Date/Time: 06/22/2021/3:45:57 PM    Final              Recent Labs: 06/21/2021: B Natriuretic Peptide 77.0 06/22/2021: TSH 1.836 06/25/2021: Magnesium 2.0 07/07/2021: ALT 5 08/18/2021: BUN 16; Creatinine, Ser 0.94; Hemoglobin 15.4; NT-Pro BNP 1,297; Platelets 512; Potassium 5.1; Sodium 133  Recent Lipid Panel No results found for: "CHOL", "TRIG", "HDL", "CHOLHDL", "VLDL", "LDLCALC", "LDLDIRECT"   Risk Assessment/Calculations:    CHA2DS2-VASc Score = 2   This indicates a 2.2% annual risk of stroke. The patient's score is based upon: CHF History: 0 HTN History: 1 Diabetes History: 1 Stroke History: 0 Vascular Disease History: 0 Age Score: 0 Gender Score: 0     STOP-Bang Score:  7       Physical Exam:    VS:  BP 122/70   Pulse 78   Ht 6\' 2"  (1.88 m)   Wt 270 lb (122.5 kg)   SpO2 97%   BMI 34.67 kg/m     Wt Readings from Last 3 Encounters:  12/20/21 270 lb (122.5 kg)  08/18/21 253 lb (114.8 kg)  08/04/21 260 lb (117.9 kg)    Gen: No distress, obese male   Neck: No JVD,  Cardiac: No Rubs or Gallops, IRIR +2 radial pulses Respiratory: bilateral wheezes, normal effort, normal  respiratory rate GI: Soft, nontender, distended with slight fluid wave MS: +1 edema;  moves all extremities Integument: Skin feels warm Neuro:  At time of evaluation, alert and oriented to person/place/time/situation  Psych: Normal affect, patient feels fine  ASSESSMENT:    1. OSA (obstructive sleep apnea)   2. Daytime somnolence   3. Paroxysmal atrial fibrillation (HCC)   4. Other emphysema (HCC)   5. Alcohol use disorder, moderate, dependence (HCC)   6. Obesity (BMI 30-39.9)     PLAN:    Permanent Atrial Fibrillation OSA- STOPBANG 6; on no therapy, will  repeat sleep study (home sleep study) Morbid Obesity - Risk factors include HTN, DM, Alcohol Use, COPD - CHADSVASC=2. - discussed alcohol as a trigger; we have discussed alcohol cessation; if he is amenable to stop drinking we will attempt DCCV (his brother has just had DCCV) - continue metoprolol 150 mg PO daily for now   Heart Failure Preserved Ejection Fraction  - NYHA class It, Stage C, hypervolemic, etiology unclear -  Diuretic regimen: lasix 40 mg PO PRN - Discussed the importance of fluid restriction of < 2 L, salt restriction, and checking daily weights  - Farxiga to 10 mg PO daily - spironolactone 25 mg - this is the best his volume status has been  Tobacco abuse - We have discussed tobacco cessation; he is precontemplative; discussed low dose patch has high dose patch makes him nauseous  TAA - mild in 2023 - echo in one year      Three months APP- if stops drinking/amenable to trial of DCCV would pursue Six months with me   Medication Adjustments/Labs and Tests Ordered: Current medicines are reviewed at length with the patient today.  Concerns regarding medicines are outlined above.  Orders Placed This Encounter  Procedures   EKG 12-Lead   [facility] Sleep Study   No orders of the defined types were placed in this encounter.   Patient Instructions  Medication Instructions:  Your physician recommends that you continue on your current medications as directed. Please refer to the Current Medication list given to you today.  *If you need a refill on your cardiac medications before your next appointment, please call your pharmacy*   Lab Work: NONE  If you have labs (blood work) drawn today and your tests are completely normal, you will receive your results only by: MyChart Message (if you have MyChart) OR A paper copy in the mail If you have any lab test that is abnormal or we need to change your treatment, we will call you to review the  results.   Testing/Procedures: Your physician has requested that you have a Home Sleep Study.    Follow-Up: At [facility], you and your health needs are our priority.  As part of our continuing mission to provide you with exceptional heart care, we have created designated Provider Care Teams.  These Care Teams include your primary Cardiologist (physician) and Advanced Practice Providers (APPs -  Physician Assistants and Nurse Practitioners) who all work together to provide you with the care you need, when you need it.  We recommend signing up for the patient portal called "MyChart".  Sign up information is provided on this After Visit Summary.  MyChart is used to connect with patients for Virtual Visits (Telemedicine).  Patients are able to view lab/test results, encounter notes, upcoming appointments, etc.  Non-urgent messages can be sent to your provider as well.   To learn more about what you can do with MyChart, go to [email or web address].    Your next appointment:   3 month(s)  The format for your next appointment:   In Person  Provider:   Jacolyn Reedy, PA-C     Then, Christell Constant, MD will plan to see you again in 6 month(s).     Important Information About Sugar         Signed, Christell Constant, MD  12/20/2021 8:44 AM    [facility]

## 2021-12-20 NOTE — Patient Instructions (Signed)
Medication Instructions:  Your physician recommends that you continue on your current medications as directed. Please refer to the Current Medication list given to you today.  *If you need a refill on your cardiac medications before your next appointment, please call your pharmacy*   Lab Work: NONE  If you have labs (blood work) drawn today and your tests are completely normal, you will receive your results only by: MyChart Message (if you have MyChart) OR A paper copy in the mail If you have any lab test that is abnormal or we need to change your treatment, we will call you to review the results.   Testing/Procedures: Your physician has requested that you have a Home Sleep Study.    Follow-Up: At [facility], you and your health needs are our priority.  As part of our continuing mission to provide you with exceptional heart care, we have created designated Provider Care Teams.  These Care Teams include your primary Cardiologist (physician) and Advanced Practice Providers (APPs -  Physician Assistants and Nurse Practitioners) who all work together to provide you with the care you need, when you need it.  We recommend signing up for the patient portal called "MyChart".  Sign up information is provided on this After Visit Summary.  MyChart is used to connect with patients for Virtual Visits (Telemedicine).  Patients are able to view lab/test results, encounter notes, upcoming appointments, etc.  Non-urgent messages can be sent to your provider as well.   To learn more about what you can do with MyChart, go to [email or web address].    Your next appointment:   3 month(s)  The format for your next appointment:   In Person  Provider:   Jacolyn Reedy, PA-C     Then, Christell Constant, MD will plan to see you again in 6 month(s).     Important Information About Sugar

## 2021-12-27 NOTE — Telephone Encounter (Signed)
I s/w the pt's wife and went over the recommendations from Dr. Mayford Knife. Pt's wife also asked if I could help set up the app on the pt's phone while I was on the phone with her now. I guided her to setting up the  [facility] app on the pt's phone with success. I have review how to use the sleep study. We agreed the pt will do the sleep study one night this week. PIN # [id number] was given to the pt's wife.   Called and made the patient aware that he may proceed with the [facility] Sleep Study. PIN # provided to the patient. Patient made aware that he will be contacted after the test has been read with the results and any recommendations. Patient verbalized understanding and thanked me for the call.

## 2021-12-27 NOTE — Telephone Encounter (Signed)
I s/w the Ronnie Dunn's wife and let her know that the Ronnie Dunn has been approved and I have the PIN# for them.  Ronnie Dunn's wife wanted to let us know that the Ronnie Dunn's sleep is a bit all over the place. She tells me that he may go to bed at 5 pm or so and sleep maybe until 9-10 pm, get's up makes himself something to eat and then sits and watches TV, which then he will eventually go back to bed and sleep until around 10 am or so. She asked what would be the best way for him to do this sleep study. I informed the Ronnie Dunn that I cam going to run this past Dr. Mayford Knife for further advice as to what she feels may be a best time frame to do the study.   I assured Mrs. Bradburn that I will call back once I d/w Dr. Mayford Knife further for the best way to approach for the Ronnie Dunn to do the study.

## 2021-12-27 NOTE — Telephone Encounter (Signed)
Patient's wife is following up, stating that they were advised that the patient would be contacted with a pass code for sleep device. She is requesting that pass code.

## 2021-12-27 NOTE — Telephone Encounter (Signed)
I will forward this phone note to the sleep coordinator that the pt is asking for the PIN #. I cannot call the pt with the PIN# until I have been notified the pt has been approved.

## 2021-12-27 NOTE — Telephone Encounter (Signed)
Prior Authorization for Automatic Data sent to [facility]- via web portal. Tracking Number .  READY TO SCHED- Prior Authorization is not required--Decision ID #:[id number]

## 2022-01-01 ENCOUNTER — Encounter: Payer: Self-pay | Admitting: Cardiology

## 2022-01-04 ENCOUNTER — Telehealth: Payer: Self-pay | Admitting: *Deleted

## 2022-01-04 DIAGNOSIS — R400 Somnolence: Secondary | ICD-10-CM

## 2022-01-04 DIAGNOSIS — G4733 Obstructive sleep apnea (adult) (pediatric): Secondary | ICD-10-CM

## 2022-01-04 DIAGNOSIS — I10 Essential (primary) hypertension: Secondary | ICD-10-CM

## 2022-01-04 NOTE — Telephone Encounter (Signed)
Ronnie Dunn home sleep study order placed.

## 2022-01-04 NOTE — Progress Notes (Signed)
This encounter was created in error - please disregard.

## 2022-01-04 NOTE — Telephone Encounter (Signed)
-----   Message from Quintella Reichert, MD sent at 01/04/2022 11:30 AM EST ----- Needs to repeat [facility] home  sleep study - poor signal on device

## 2022-01-04 NOTE — Procedures (Signed)
Erroneous encounter

## 2022-01-28 ENCOUNTER — Telehealth: Payer: Self-pay | Admitting: *Deleted

## 2022-01-28 NOTE — Telephone Encounter (Signed)
   Pre-operative Risk Assessment    Patient Name: Ronnie Dunn  DOB: 1960-07-09 MRN: [id number]      Request for Surgical Clearance    Procedure:   COLONOSCOPY / ENDOSCOPY  Date of Surgery:  Clearance 04/05/22                                 Surgeon:  DR. Alessandra Bevels Surgeon's Group or Practice Name:  [facility] Phone number:  [phone] Fax number:  [phone]   Type of Clearance Requested:   - Pharmacy:  Hold Rivaroxaban (Xarelto) X'S 2 DAYS   Type of Anesthesia:   PROPROFOL   Additional requests/questions:    [name]   01/28/2022, 1:37 PM

## 2022-01-28 NOTE — Telephone Encounter (Signed)
Patient with diagnosis of PAF(paroxysmal atrial fibrillation) on Xarelto for anticoagulation.    Procedure: Colonoscopy/Endoscopy  Date of procedure: 04/05/2022   CHA2DS2-VASc Score = 2   This indicates a 2.2% annual risk of stroke. The patient's score is based upon: CHF History: 0 HTN History: 1 Diabetes History: 1 Stroke History: 0 Vascular Disease History: 0 Age Score: 0 Gender Score: 0     CrCl >90 ml/min Platelet count 512   Per office protocol, patient can hold Xarelto for 2 days prior to procedure.   Patient will not need bridging with Lovenox (enoxaparin) around procedure.  **This guidance is not considered finalized until pre-operative APP has relayed final recommendations.**

## 2022-01-31 NOTE — Telephone Encounter (Signed)
   Patient Name: Ronnie Dunn  DOB: 09-Feb-1960 MRN: [id number]  Primary Cardiologist: Werner Lean, MD  Clinical pharmacists have reviewed the patient's past medical history, labs, and current medications as part of preoperative protocol coverage. The following recommendations have been made:   Per office protocol, patient can hold Xarelto for 2 days prior to procedure.   Patient will not need bridging with Lovenox (enoxaparin) around procedure.   I will route this recommendation to the requesting party via Epic fax function and remove from pre-op pool.  Please call with questions.  Mable Fill, Marissa Nestle, NP 01/31/2022, 7:32 AM

## 2022-02-04 ENCOUNTER — Other Ambulatory Visit: Payer: Self-pay | Admitting: Gastroenterology

## 2022-02-11 ENCOUNTER — Other Ambulatory Visit: Payer: Self-pay

## 2022-02-25 ENCOUNTER — Telehealth: Payer: Self-pay

## 2022-02-25 NOTE — Telephone Encounter (Signed)
**Note De-Identified Ronnie Dunn Obfuscation** [name] PA started through covermymeds. Key: [id number]

## 2022-03-02 NOTE — Progress Notes (Addendum)
Cardiology Office Note:    Date:  03/15/2022   ID:  Ronnie Dunn, DOB October 15, 1960, MRN [id number]  PCP:  Leonides Sake, MD  [facility] Cardiologist:  Werner Lean, MD     Referring MD: [name], PA-C   Chief Complaint:  No chief complaint on file.     History of Present Illness:   NAZZARENO Dunn is a 62 y.o. male with a hx of PAF, COPD, still actively smoking, blood in stool, HTN with DM, alcohol use, long history of passing out after he started mixing chemicals together. Found to have low sodium and seizures.           .   Patient saw Dr. Gasper Sells 06/01/21 and cleared for surgery. Ordered echo 06/22/21-normal LVEF 60-65% no WMA, mildly dilated Ao root 40 mm, ascending aorta 42 mm and increased farxiga and added metolazone 2.5 mg weekly.   Patient discharged 06/22/21 multilobular PNA/CAP, noncompliant with meds at home and hospital. Had LOC ? Secondary to seizure-not orthostatic.   I saw the patient for preop clearance 08/2021 but he passed out when he ran out of BP meds.  He had ongoing blood loss so Dr. Gasper Sells approved surgery.   Patient here for preop clearance for colonoscopy Dr. Alessandra Bevels 04/15/22. It was was never done before because it has to be done in hospital. Has gout in left finger. Drinks 4-5 beers daily. Smoking 1 ppd-has cut back. Passed out 3 weeks ago when he got up to go to [facility]. BP was 110/54.  He's taking his meds.       Past Medical History:  Diagnosis Date   Chronic heart failure with preserved ejection fraction ([place]) 06/01/2021   Chronic obstructive pulmonary disease ([place]) 05/28/2021   Class 1 obesity 03/29/2020   Essential hypertension 03/29/2020   Gastroesophageal reflux disease 05/28/2021   Hyponatremia 03/29/2020   OSA (obstructive sleep apnea) 06/01/2021   Permanent atrial fibrillation ([place]) 05/28/2021   Thoracic aortic aneurysm without rupture ([place]) 06/01/2021   Type 2 diabetes mellitus without  complication, without long-term current use of insulin ([place]) 05/28/2021   Current Medications: Current Meds  Medication Sig   albuterol (VENTOLIN HFA) 108 (90 Base) MCG/ACT inhaler Inhale 2 puffs into the lungs every 4 (four) hours as needed.   allopurinol (ZYLOPRIM) 100 MG tablet Take 100 mg by mouth daily.   ascorbic acid (VITAMIN C) 500 MG tablet Take 500 mg by mouth daily.   atorvastatin (LIPITOR) 10 MG tablet Take 1 tablet (10 mg total) by mouth daily.   cetirizine (ZYRTEC) 10 MG tablet Take 10 mg by mouth daily.   Cholecalciferol (VITAMIN D3) 50 MCG (2000 UT) TABS Take by mouth daily at 6 (six) AM.   colchicine 0.6 MG tablet Take 1 tablet (0.6 mg total) by mouth 2 (two) times daily.   dapagliflozin propanediol (FARXIGA) 10 MG TABS tablet Take 1 tablet (10 mg total) by mouth daily before breakfast.   famotidine (PEPCID) 40 MG tablet Take 40 mg by mouth daily.   folic acid (FOLVITE) 1 MG tablet Take 1 tablet by mouth once daily   hydrALAZINE (APRESOLINE) 50 MG tablet Take 1 tablet (50 mg total) by mouth 3 (three) times daily.   lisinopril (ZESTRIL) 5 MG tablet Take 1 tablet by mouth once daily   magnesium oxide (MAG-OX) 400 MG tablet Take 400 mg by mouth daily.   metoprolol succinate (TOPROL-XL) 100 MG 24 hr tablet Take 150 mg by mouth daily.  pantoprazole (PROTONIX) 40 MG tablet Take 1 tablet by mouth once daily   rivaroxaban (XARELTO) 20 MG TABS tablet Take 1 tablet (20 mg total) by mouth daily with supper.   spironolactone (ALDACTONE) 25 MG tablet Take 1 tablet (25 mg total) by mouth daily.   thiamine 100 MG tablet Take 1 tablet (100 mg total) by mouth daily.   traZODone (DESYREL) 50 MG tablet Take 50 mg by mouth daily as needed for sleep (anxiety).   Turmeric (QC TUMERIC COMPLEX) 500 MG CAPS Take by mouth daily at 6 (six) AM.   vitamin E 180 MG (400 UNITS) capsule Take 400 Units by mouth daily.   zinc gluconate 50 MG tablet Take 50 mg by mouth daily.    Allergies:   Patient has  no known allergies.   Social History   Tobacco Use   Smoking status: Every Day    Packs/day: 0.50    Types: Cigarettes    Start date: 1978   Smokeless tobacco: Never  Vaping Use   Vaping Use: Never used  Substance Use Topics   Alcohol use: Yes    Alcohol/week: 20.0 standard drinks of alcohol    Types: 20 Cans of beer per week   Drug use: Not Currently    Family Hx: The patient's family history includes Hypertension in an other family member.  ROS     Physical Exam:    VS:  BP 112/72   Pulse 98   Ht '6\' 2"'$  (1.88 m)   Wt 265 lb (120.2 kg)   SpO2 98%   BMI 34.02 kg/m     Wt Readings from Last 3 Encounters:  03/15/22 265 lb (120.2 kg)  12/20/21 270 lb (122.5 kg)  08/18/21 253 lb (114.8 kg)    Physical Exam  GEN: Obese in no acute distress  Neck: no JVD, carotid bruits, or masses Cardiac: irreg irreg no murmurs, rubs, or gallops  Respiratory:  diffuse wheezing and rhonchi  GI: soft, nontender, nondistended, + BS Ext: swelling and pain left middle finger and right pinkie lower ext without cyanosis, clubbing, or edema, Good distal pulses bilaterally Neuro:  Alert and Oriented x 3,  Psych: euthymic mood, full affect        EKGs/Labs/Other Test Reviewed:    EKG:  EKG is  not ordered today.     Recent Labs: 06/21/2021: B Natriuretic Peptide 77.0 06/22/2021: TSH 1.836 06/25/2021: Magnesium 2.0 07/07/2021: ALT 5 08/18/2021: BUN 16; Creatinine, Ser 0.94; Hemoglobin 15.4; NT-Pro BNP 1,297; Platelets 512; Potassium 5.1; Sodium 133   Recent Lipid Panel No results for input(s): "CHOL", "TRIG", "HDL", "VLDL", "LDLCALC", "LDLDIRECT" in the last 8760 hours.   Prior CV Studies:  NST 03/22/22   The study is normal. The study is low risk.   No ST deviation was noted.   LV perfusion is normal. There is no evidence of ischemia. There is no evidence of infarction.   Left ventricular function is normal. Nuclear stress EF: 55 %. The left ventricular ejection fraction is normal  (55-65%). End diastolic cavity size is normal. End systolic cavity size is normal.   Prior study not available for comparison.   Normal stress nuclear study with apical thinning but no ischemia; gated EF 55 with normal wall motion.  06/22/21 Echocardiogram Left Ventricle: LVEF= 60 to 65%. The left ventricle no WMA  -Left ventricular diastolic function could not be evaluated due to atrial fibrillation. 06/23/21 Carotid Doppler Right Carotid: Velocities in the right ICA are consistent with a  1-39% stenosis. Left Carotid: Velocities in the left ICA are consistent with a 1-39% stenosis. Vertebrals: Bilateral vertebral arteries demonstrate antegrade flow.     Risk Assessment/Calculations/Metrics:    CHA2DS2-VASc Score = 2   This indicates a 2.2% annual risk of stroke. The patient's score is based upon: CHF History: 0 HTN History: 1 Diabetes History: 1 Stroke History: 0 Vascular Disease History: 0 Age Score: 0 Gender Score: 0    STOP-Bang Score:  7           ASSESSMENT & PLAN:   No problem-specific Assessment & Plan notes found for this encounter.   Preop clearance for colonoscopy 04/05/22 by Dr. Fabio Pierce [facility] Per office protocol, patient can hold Xarelto for 2 days prior to procedure.   Patient will not need bridging with Lovenox (enoxaparin) around procedure. Patient has had recurrent syncope unexplained. METs less than 4. Will order lexiscan myoview prior to colonoscopy.  According to the Revised Cardiac Risk Index (RCRI), his Perioperative Risk of Major Cardiac Event is (%): 0.4  His Functional Capacity in METs is: 3.63 according to the [facility] Activity Status Index (DASI).  Addendum: NST normal, low risk. He's acceptable risk to proceed with colonoscopy.  LOC/seizure disorder. Patient has had recurrent LOC-most recent 3 weeks ago when he got up to go to the [facility]. Wife says no seizure activity and came around quickly. BP 110/54.  Not orthostatic in hospital. Has been a recurrent  problem.    PAF seems to be persistent at this point   HTN controlled   ETOH-drinking 4-5 beers daily   Acute gout-add colchiceine and f/u with PCP  OSA screening-sleep study poor signal and needs repeated.       Shared Decision Making/Informed Consent The risks [chest pain, shortness of breath, cardiac arrhythmias, dizziness, blood pressure fluctuations, myocardial infarction, stroke/transient ischemic attack, nausea, vomiting, allergic reaction, radiation exposure, metallic taste sensation and life-threatening complications (estimated to be 1 in 10,000)], benefits (risk stratification, diagnosing coronary artery disease, treatment guidance) and alternatives of a nuclear stress test were discussed in detail with Mr. Bournes and he agrees to proceed.   Dispo:  Return in about 4 months (around 07/14/2022) for Routine follow up in 4 months with Dr. Gasper Sells.   Medication Adjustments/Labs and Tests Ordered: Current medicines are reviewed at length with the patient today.  Concerns regarding medicines are outlined above.  Tests Ordered: Orders Placed This Encounter  Procedures   Cardiac Stress Test: Informed Consent Details: Physician/Practitioner Attestation; Transcribe to consent form and obtain patient signature   Myocardial Perfusion Imaging   Medication Changes: Meds ordered this encounter  Medications   colchicine 0.6 MG tablet    Sig: Take 1 tablet (0.6 mg total) by mouth 2 (two) times daily.    Dispense:  60 tablet    Refill:  0   Sumner Boast, PA-C  03/15/2022 10:03 AM    [address], [place], [place]  [address] Phone: [phone]; Fax: [phone]

## 2022-03-15 ENCOUNTER — Ambulatory Visit: Payer: Medicaid Other | Attending: Physician Assistant | Admitting: Physician Assistant

## 2022-03-15 ENCOUNTER — Encounter: Payer: Self-pay | Admitting: Physician Assistant

## 2022-03-15 VITALS — BP 112/72 | HR 98 | Ht 74.0 in | Wt 265.0 lb

## 2022-03-15 DIAGNOSIS — I480 Paroxysmal atrial fibrillation: Secondary | ICD-10-CM | POA: Diagnosis not present

## 2022-03-15 DIAGNOSIS — Z01818 Encounter for other preprocedural examination: Secondary | ICD-10-CM

## 2022-03-15 DIAGNOSIS — R4020 Unspecified coma: Secondary | ICD-10-CM | POA: Diagnosis not present

## 2022-03-15 DIAGNOSIS — I10 Essential (primary) hypertension: Secondary | ICD-10-CM | POA: Diagnosis not present

## 2022-03-15 DIAGNOSIS — M1000 Idiopathic gout, unspecified site: Secondary | ICD-10-CM

## 2022-03-15 DIAGNOSIS — F1020 Alcohol dependence, uncomplicated: Secondary | ICD-10-CM

## 2022-03-15 MED ORDER — COLCHICINE 0.6 MG PO TABS: 0.6000 mg | ORAL_TABLET | Freq: Two times a day (BID) | ORAL | 0 refills | Status: AC

## 2022-03-15 NOTE — Addendum Note (Signed)
Addended by: Freada Bergeron on: 03/15/2022 10:27 AM   Modules accepted: Orders

## 2022-03-15 NOTE — Patient Instructions (Signed)
Medication Instructions:   START Colchicine one (1) tablet by mouth (0.6 mg) twice daily.  First dose take (2) tablets by mouth at the same time and take one (1) tablet by mouth 1 hour later.   *If you need a refill on your cardiac medications before your next appointment, please call your pharmacy*   Lab Work:  None ordered.  If you have labs (blood work) drawn today and your tests are completely normal, you will receive your results only by: [place] (if you have MyChart) OR A paper copy in the mail If you have any lab test that is abnormal or we need to change your treatment, we will call you to review the results.   Testing/Procedures:  You are scheduled for a Myocardial Perfusion Imaging Study on Tuesday, March 5 at 7:15 am.   Please arrive 15 minutes prior to your appointment time for registration and insurance purposes.   The test will take approximately 3 to 4 hours to complete; you may bring reading material. If someone comes with you to your appointment, they will need to remain in the main lobby due to limited space in the testing area.   How to prepare for your Myocardial Perfusion test:   Do not eat or drink 3 hours prior to your test, except you may have water.    Do not consume products containing caffeine (regular or decaffeinated) 12 hours prior to your test (ex: coffee, chocolate, soda, tea)   Do bring a list of your current medications with you. If not listed below, you may take your medications as normal.   Bring any held medication to your appointment, as you may be required to take it once the test is complete.   Do wear comfortable clothes (no dresses or overalls) and walking shoes. Tennis shoes are preferred. No heels or open toed shoes.  Do not wear cologne, aftershave or lotions (deodorant is allowed).   If these instructions are not followed, you test will have to be rescheduled.   Please report to [address] for your  test. If you have questions or concerns about your appointment, please call the Nuclear Lab at [phone].  If you cannot keep your appointment, please provide 24 hour notification to the Nuclear lab to avoid a possible $50 charge to your account.       Follow-Up: At [facility], you and your health needs are our priority.  As part of our continuing mission to provide you with exceptional heart care, we have created designated Provider Care Teams.  These Care Teams include your primary Cardiologist (physician) and Advanced Practice Providers (APPs -  Physician Assistants and Nurse Practitioners) who all work together to provide you with the care you need, when you need it.  We recommend signing up for the patient portal called "MyChart".  Sign up information is provided on this After Visit Summary.  MyChart is used to connect with patients for Virtual Visits (Telemedicine).  Patients are able to view lab/test results, encounter notes, upcoming appointments, etc.  Non-urgent messages can be sent to your provider as well.   To learn more about what you can do with MyChart, go to [email or web address].    Your next appointment:   4 month(s)  Provider:   Werner Lean, MD

## 2022-03-15 NOTE — Telephone Encounter (Signed)
The patient has been notified of the result and verbalized understanding.  All questions (if any) were answered. Ronnie Dunn, [place] 03/15/2022 10:23 AM    Patient prefers to have the traditional Home Sleep Test in place of the [facility].  Home sleep Test ordered and will precert

## 2022-03-17 ENCOUNTER — Telehealth (HOSPITAL_COMMUNITY): Payer: Self-pay

## 2022-03-17 NOTE — Telephone Encounter (Signed)
Spoke with the patient, detailed instructions given. He stated that he would be here for his test. Asked to call back with any questions. S.Irena Gaydos EMTP 

## 2022-03-22 ENCOUNTER — Ambulatory Visit (HOSPITAL_COMMUNITY): Payer: Medicaid Other | Attending: Cardiology

## 2022-03-22 DIAGNOSIS — I10 Essential (primary) hypertension: Secondary | ICD-10-CM | POA: Diagnosis present

## 2022-03-22 DIAGNOSIS — R4020 Unspecified coma: Secondary | ICD-10-CM

## 2022-03-22 DIAGNOSIS — Z01818 Encounter for other preprocedural examination: Secondary | ICD-10-CM | POA: Diagnosis present

## 2022-03-22 DIAGNOSIS — F1020 Alcohol dependence, uncomplicated: Secondary | ICD-10-CM | POA: Diagnosis present

## 2022-03-22 DIAGNOSIS — M1000 Idiopathic gout, unspecified site: Secondary | ICD-10-CM

## 2022-03-22 DIAGNOSIS — I480 Paroxysmal atrial fibrillation: Secondary | ICD-10-CM | POA: Diagnosis present

## 2022-03-22 LAB — MYOCARDIAL PERFUSION IMAGING
LV dias vol: 102 mL (ref 62–150)
LV sys vol: 46 mL
Nuc Stress EF: 55 %
Peak HR: 114 {beats}/min
Rest HR: 75 {beats}/min
Rest Nuclear Isotope Dose: 10.8 mCi
SDS: 4
SRS: 1
SSS: 5
ST Depression (mm): 0 mm
Stress Nuclear Isotope Dose: 32.4 mCi
TID: 1.07

## 2022-03-22 MED ORDER — TECHNETIUM TC 99M TETROFOSMIN IV KIT
10.8000 | PACK | Freq: Once | INTRAVENOUS | Status: AC | PRN
  Administered 2022-03-22: 10.8 via INTRAVENOUS

## 2022-03-22 MED ORDER — REGADENOSON 0.4 MG/5ML IV SOLN
0.4000 mg | Freq: Once | INTRAVENOUS | Status: AC
  Administered 2022-03-22: 0.4 mg via INTRAVENOUS

## 2022-03-22 MED ORDER — TECHNETIUM TC 99M TETROFOSMIN IV KIT
32.4000 | PACK | Freq: Once | INTRAVENOUS | Status: AC | PRN
  Administered 2022-03-22: 32.4 via INTRAVENOUS

## 2022-03-29 ENCOUNTER — Encounter (HOSPITAL_COMMUNITY): Payer: Self-pay | Admitting: Gastroenterology

## 2022-04-05 ENCOUNTER — Other Ambulatory Visit: Payer: Self-pay

## 2022-04-05 ENCOUNTER — Ambulatory Visit (HOSPITAL_BASED_OUTPATIENT_CLINIC_OR_DEPARTMENT_OTHER): Payer: Medicaid Other | Admitting: Certified Registered"

## 2022-04-05 ENCOUNTER — Encounter (HOSPITAL_COMMUNITY): Payer: Self-pay | Admitting: Gastroenterology

## 2022-04-05 ENCOUNTER — Ambulatory Visit (HOSPITAL_COMMUNITY)
Admission: RE | Admit: 2022-04-05 | Discharge: 2022-04-05 | Disposition: A | Discharge status: Home / Self Care | Payer: Medicaid Other | Attending: Gastroenterology | Admitting: Gastroenterology

## 2022-04-05 ENCOUNTER — Encounter (HOSPITAL_COMMUNITY)
Admission: RE | Disposition: A | Discharge status: Home / Self Care | Payer: Self-pay | Source: Home / Self Care | Attending: Gastroenterology

## 2022-04-05 ENCOUNTER — Ambulatory Visit (HOSPITAL_COMMUNITY): Payer: Medicaid Other | Admitting: Certified Registered"

## 2022-04-05 DIAGNOSIS — D125 Benign neoplasm of sigmoid colon: Secondary | ICD-10-CM | POA: Diagnosis not present

## 2022-04-05 DIAGNOSIS — D124 Benign neoplasm of descending colon: Secondary | ICD-10-CM | POA: Insufficient documentation

## 2022-04-05 DIAGNOSIS — Z7984 Long term (current) use of oral hypoglycemic drugs: Secondary | ICD-10-CM

## 2022-04-05 DIAGNOSIS — D122 Benign neoplasm of ascending colon: Secondary | ICD-10-CM | POA: Diagnosis not present

## 2022-04-05 DIAGNOSIS — I110 Hypertensive heart disease with heart failure: Secondary | ICD-10-CM | POA: Insufficient documentation

## 2022-04-05 DIAGNOSIS — K2970 Gastritis, unspecified, without bleeding: Secondary | ICD-10-CM | POA: Diagnosis not present

## 2022-04-05 DIAGNOSIS — Z1211 Encounter for screening for malignant neoplasm of colon: Secondary | ICD-10-CM | POA: Diagnosis not present

## 2022-04-05 DIAGNOSIS — Z7901 Long term (current) use of anticoagulants: Secondary | ICD-10-CM | POA: Diagnosis not present

## 2022-04-05 DIAGNOSIS — I5032 Chronic diastolic (congestive) heart failure: Secondary | ICD-10-CM | POA: Insufficient documentation

## 2022-04-05 DIAGNOSIS — I4821 Permanent atrial fibrillation: Secondary | ICD-10-CM | POA: Insufficient documentation

## 2022-04-05 DIAGNOSIS — G40909 Epilepsy, unspecified, not intractable, without status epilepticus: Secondary | ICD-10-CM | POA: Diagnosis not present

## 2022-04-05 DIAGNOSIS — D123 Benign neoplasm of transverse colon: Secondary | ICD-10-CM | POA: Diagnosis not present

## 2022-04-05 DIAGNOSIS — D120 Benign neoplasm of cecum: Secondary | ICD-10-CM | POA: Diagnosis not present

## 2022-04-05 DIAGNOSIS — R195 Other fecal abnormalities: Secondary | ICD-10-CM | POA: Diagnosis not present

## 2022-04-05 DIAGNOSIS — G4733 Obstructive sleep apnea (adult) (pediatric): Secondary | ICD-10-CM | POA: Diagnosis not present

## 2022-04-05 DIAGNOSIS — F17210 Nicotine dependence, cigarettes, uncomplicated: Secondary | ICD-10-CM | POA: Diagnosis not present

## 2022-04-05 DIAGNOSIS — K219 Gastro-esophageal reflux disease without esophagitis: Secondary | ICD-10-CM | POA: Diagnosis not present

## 2022-04-05 DIAGNOSIS — E119 Type 2 diabetes mellitus without complications: Secondary | ICD-10-CM

## 2022-04-05 DIAGNOSIS — J449 Chronic obstructive pulmonary disease, unspecified: Secondary | ICD-10-CM | POA: Insufficient documentation

## 2022-04-05 DIAGNOSIS — Z8249 Family history of ischemic heart disease and other diseases of the circulatory system: Secondary | ICD-10-CM | POA: Insufficient documentation

## 2022-04-05 DIAGNOSIS — K648 Other hemorrhoids: Secondary | ICD-10-CM

## 2022-04-05 HISTORY — PX: POLYPECTOMY: SHX5525

## 2022-04-05 HISTORY — PX: BIOPSY: SHX5522

## 2022-04-05 HISTORY — PX: ESOPHAGOGASTRODUODENOSCOPY (EGD) WITH PROPOFOL: SHX5813

## 2022-04-05 HISTORY — PX: COLONOSCOPY WITH PROPOFOL: SHX5780

## 2022-04-05 LAB — POCT I-STAT, CHEM 8
BUN: 27 mg/dL — ABNORMAL HIGH (ref 8–23)
Calcium, Ion: 1.15 mmol/L (ref 1.15–1.40)
Chloride: 93 mmol/L — ABNORMAL LOW (ref 98–111)
Creatinine, Ser: 1.1 mg/dL (ref 0.61–1.24)
Glucose, Bld: 123 mg/dL — ABNORMAL HIGH (ref 70–99)
HCT: 43 % (ref 39.0–52.0)
Hemoglobin: 14.6 g/dL (ref 13.0–17.0)
Potassium: 4.6 mmol/L (ref 3.5–5.1)
Sodium: 126 mmol/L — ABNORMAL LOW (ref 135–145)
TCO2: 22 mmol/L (ref 22–32)

## 2022-04-05 LAB — GLUCOSE, CAPILLARY: Glucose-Capillary: 133 mg/dL — ABNORMAL HIGH (ref 70–99)

## 2022-04-05 SURGERY — COLONOSCOPY WITH PROPOFOL
Anesthesia: Monitor Anesthesia Care

## 2022-04-05 MED ORDER — SODIUM CHLORIDE 0.9 % IV SOLN: INTRAVENOUS | Status: DC

## 2022-04-05 MED ORDER — PROPOFOL 500 MG/50ML IV EMUL
INTRAVENOUS | Status: AC
  Filled 2022-04-05: qty 50

## 2022-04-05 MED ORDER — ALBUTEROL SULFATE HFA 108 (90 BASE) MCG/ACT IN AERS
2.0000 | INHALATION_SPRAY | Freq: Four times a day (QID) | RESPIRATORY_TRACT | Status: DC | PRN
  Administered 2022-04-05: 2 via RESPIRATORY_TRACT
  Filled 2022-04-05: qty 6.7

## 2022-04-05 MED ORDER — LIDOCAINE HCL (CARDIAC) PF 100 MG/5ML IV SOSY
PREFILLED_SYRINGE | INTRAVENOUS | Status: DC | PRN
  Administered 2022-04-05: 100 mg via INTRAVENOUS

## 2022-04-05 MED ORDER — PROPOFOL 1000 MG/100ML IV EMUL
INTRAVENOUS | Status: AC
  Filled 2022-04-05: qty 100

## 2022-04-05 MED ORDER — ALBUTEROL SULFATE HFA 108 (90 BASE) MCG/ACT IN AERS
INHALATION_SPRAY | RESPIRATORY_TRACT | Status: AC
  Filled 2022-04-05: qty 6.7

## 2022-04-05 MED ORDER — LACTATED RINGERS IV SOLN
INTRAVENOUS | Status: AC | PRN
  Administered 2022-04-05: 1000 mL via INTRAVENOUS

## 2022-04-05 MED ORDER — PROPOFOL 10 MG/ML IV BOLUS
INTRAVENOUS | Status: DC | PRN
  Administered 2022-04-05: 40 mg via INTRAVENOUS
  Administered 2022-04-05: 60 mg via INTRAVENOUS

## 2022-04-05 MED ORDER — PROPOFOL 500 MG/50ML IV EMUL
INTRAVENOUS | Status: DC | PRN
  Administered 2022-04-05: 125 ug/kg/min via INTRAVENOUS

## 2022-04-05 MED ORDER — ALBUTEROL SULFATE (2.5 MG/3ML) 0.083% IN NEBU: 2.5000 mg | INHALATION_SOLUTION | RESPIRATORY_TRACT | Status: DC | PRN

## 2022-04-05 MED ORDER — PROPOFOL 10 MG/ML IV BOLUS
INTRAVENOUS | Status: AC
  Filled 2022-04-05: qty 20

## 2022-04-05 SURGICAL SUPPLY — 25 items
BLOCK BITE 60FR ADLT L/F BLUE (MISCELLANEOUS) ×2 IMPLANT
ELECT REM PT RETURN 9FT ADLT (ELECTROSURGICAL) IMPLANT
ELECTRODE REM PT RTRN 9FT ADLT (ELECTROSURGICAL) IMPLANT
FCP BXJMBJMB 240X2.8X (CUTTING FORCEPS)
FLOOR PAD 36X40 (MISCELLANEOUS) ×2 IMPLANT
FORCEP RJ3 GP 1.8X160 W-NEEDLE (CUTTING FORCEPS) IMPLANT
FORCEPS BIOP RAD 4 LRG CAP 4 (CUTTING FORCEPS) IMPLANT
FORCEPS BIOP RJ4 240 W/NDL (CUTTING FORCEPS) IMPLANT
FORCEPS BXJMBJMB 240X2.8X (CUTTING FORCEPS) IMPLANT
INJECTOR/SNARE I SNARE (MISCELLANEOUS) IMPLANT
LUBRICANT JELLY 4.5OZ STERILE (MISCELLANEOUS) IMPLANT
MANIFOLD NEPTUNE II (INSTRUMENTS) IMPLANT
NDL SCLEROTHERAPY 25GX240 (NEEDLE) IMPLANT
NEEDLE SCLEROTHERAPY 25GX240 (NEEDLE) IMPLANT
PAD FLOOR 36X40 (MISCELLANEOUS) ×2 IMPLANT
PROBE APC STR FIRE (PROBE) IMPLANT
PROBE INJECTION GOLD (MISCELLANEOUS)
PROBE INJECTION GOLD 7FR (MISCELLANEOUS) IMPLANT
SNARE ROTATE MED OVAL 20MM (MISCELLANEOUS) IMPLANT
SNARE SHORT THROW 13M SML OVAL (MISCELLANEOUS) IMPLANT
SYR 50ML LL SCALE MARK (SYRINGE) IMPLANT
TRAP SPECIMEN MUCOUS 40CC (MISCELLANEOUS) IMPLANT
TUBING ENDO SMARTCAP PENTAX (MISCELLANEOUS) ×4 IMPLANT
TUBING IRRIGATION ENDOGATOR (MISCELLANEOUS) ×2 IMPLANT
WATER STERILE IRR 1000ML POUR (IV SOLUTION) IMPLANT

## 2022-04-05 NOTE — Discharge Instructions (Signed)

## 2022-04-05 NOTE — Op Note (Signed)
[facility] Patient Name: Ronnie Dunn Procedure Date: 04/05/2022 MRN: [id number] Attending MD: Otis Brace , MD, [id number] Date of Birth: 02/02/1960 CSN: [id number] Age: 62 Admit Type: Outpatient Procedure:                Colonoscopy Indications:              Positive fecal immunochemical test Providers:                Otis Brace, MD, Jaci Carrel, RN, Gloris Ham, Technician Referring MD:              Medicines:                Sedation Administered by an Anesthesia Professional Complications:            No immediate complications. Estimated Blood Loss:     Estimated blood loss was minimal. Procedure:                Pre-Anesthesia Assessment:                           - Prior to the procedure, a History and Physical                            was performed, and patient medications and                            allergies were reviewed. The patient's tolerance of                            previous anesthesia was also reviewed. The risks                            and benefits of the procedure and the sedation                            options and risks were discussed with the patient.                            All questions were answered, and informed consent                            was obtained. Prior Anticoagulants: The patient has                            taken Xarelto (rivaroxaban), last dose was 2 days                            prior to procedure. ASA Grade Assessment: III - A                            patient with severe systemic disease. After  reviewing the risks and benefits, the patient was                            deemed in satisfactory condition to undergo the                            procedure.                           After obtaining informed consent, the colonoscope                            was passed under direct vision. Throughout the                             procedure, the patient's blood pressure, pulse, and                            oxygen saturations were monitored continuously. The                            [id number]-HQ190L ([id number]) Olympus colonoscope was                            introduced through the anus and advanced to the the                            cecum, identified by appendiceal orifice and                            ileocecal valve. The colonoscopy was performed                            without difficulty. The patient tolerated the                            procedure well. The quality of the bowel                            preparation was adequate to identify polyps greater                            than 5 mm in size. The ileocecal valve, appendiceal                            orifice, and rectum were photographed. Scope In: 8:03:20 AM Scope Out: 8:24:04 AM Scope Withdrawal Time: 0 hours 14 minutes 10 seconds  Total Procedure Duration: 0 hours 20 minutes 44 seconds  Findings:      Hemorrhoids were found on perianal exam.      Four sessile polyps were found in the cecum. The polyps were 3 to 6 mm       in size. These polyps were removed with a cold snare. Resection and       retrieval were complete.      Five  sessile polyps were found in the transverse colon. The polyps were       4 to 7 mm in size. These polyps were removed with a cold snare.       Resection and retrieval were complete.      Two sessile polyps were found in the descending colon. The polyps were       small in size. These polyps were removed with a cold snare. Resection       and retrieval were complete.      Three sessile polyps were found in the sigmoid colon. The polyps were 5       to 9 mm in size. These polyps were removed with a cold snare. Resection       and retrieval were complete.      Internal hemorrhoids were found during retroflexion. The hemorrhoids       were medium-sized. Impression:               - Hemorrhoids found on perianal exam.                            - Four 3 to 6 mm polyps in the cecum, removed with                            a cold snare. Resected and retrieved.                           - Five 4 to 7 mm polyps in the transverse colon,                            removed with a cold snare. Resected and retrieved.                           - Two small polyps in the descending colon, removed                            with a cold snare. Resected and retrieved.                           - Three 5 to 9 mm polyps in the sigmoid colon,                            removed with a cold snare. Resected and retrieved.                           - Internal hemorrhoids. Moderate Sedation:      Moderate (conscious) sedation was personally administered by an       anesthesia professional. The following parameters were monitored: oxygen       saturation, heart rate, blood pressure, and response to care. Recommendation:           - Patient has a contact number available for                            emergencies. The signs and symptoms of potential  delayed complications were discussed with the                            patient. Return to normal activities tomorrow.                            Written discharge instructions were provided to the                            patient.                           - Resume previous diet.                           - Continue present medications.                           - Await pathology results.                           - Repeat colonoscopy date to be determined after                            pending pathology results are reviewed for                            surveillance based on pathology results.                           - Resume Xarelto (rivaroxaban) at prior dose                            tomorrow. Procedure Code(s):        --- Professional ---                           [id number], Colonoscopy, flexible; with removal of                             tumor(s), polyp(s), or other lesion(s) by snare                            technique Diagnosis Code(s):        --- Professional ---                           D12.0, Benign neoplasm of cecum                           D12.3, Benign neoplasm of transverse colon (hepatic                            flexure or splenic flexure)                           D12.4, Benign neoplasm of descending colon  D12.5, Benign neoplasm of sigmoid colon                           K64.8, Other hemorrhoids                           R19.5, Other fecal abnormalities CPT copyright 2022 American Medical Association. All rights reserved. The codes documented in this report are preliminary and upon coder review may  be revised to meet current compliance requirements. Otis Brace, MD Otis Brace, MD 04/05/2022 8:46:02 AM Number of Addenda: 0

## 2022-04-05 NOTE — Anesthesia Preprocedure Evaluation (Addendum)
Anesthesia Evaluation  Patient identified by MRN, date of birth, ID band Patient awake    Reviewed: Allergy & Precautions, NPO status , Patient's Chart, lab work & pertinent test results, reviewed documented beta blocker date and time   Airway Mallampati: III  TM Distance: >3 FB Neck ROM: Full    Dental  (+) Edentulous Upper, Dental Advisory Given, Missing,    Pulmonary sleep apnea (no CPAP) , COPD,  COPD inhaler, Current Smoker and Patient abstained from smoking.   Pulmonary exam normal breath sounds clear to auscultation       Cardiovascular hypertension, Pt. on home beta blockers and Pt. on medications +CHF  Normal cardiovascular exam+ dysrhythmias Atrial Fibrillation  Rhythm:Regular Rate:Normal  Stress Test 2024 Normal stress nuclear study with apical thinning but no ischemia; gated EF 55 with normal wall motion.  TTE 2023 1. Left ventricular ejection fraction, by estimation, is 60 to 65%. The  left ventricle has normal function. The left ventricle has no regional  wall motion abnormalities. There is mild concentric left ventricular  hypertrophy. Left ventricular diastolic  function could not be evaluated.   2. Right ventricular systolic function is normal. The right ventricular  size is normal. Tricuspid regurgitation signal is inadequate for assessing  PA pressure.   3. The mitral valve is grossly normal. Trivial mitral valve  regurgitation. No evidence of mitral stenosis.   4. The aortic valve is tricuspid. Aortic valve regurgitation is not  visualized. No aortic stenosis is present.   5. Aortic dilatation noted. There is mild dilatation of the aortic root,  measuring 40 mm. There is mild dilatation of the ascending aorta,  measuring 42 mm.   6. The inferior vena cava is dilated in size with >50% respiratory  variability, suggesting right atrial pressure of 8 mmHg.     Neuro/Psych Seizures - (last seizure 4-5 weeks  ago per pt, no meds), Poorly Controlled,   negative psych ROS   GI/Hepatic ,GERD  ,,(+)     substance abuse  alcohol use  Endo/Other  diabetes, Type 2, Oral Hypoglycemic Agents    Renal/GU negative Renal ROS  negative genitourinary   Musculoskeletal negative musculoskeletal ROS (+)    Abdominal   Peds  Hematology  (+) Blood dyscrasia (xarelto)   Anesthesia Other Findings   Reproductive/Obstetrics                             Anesthesia Physical Anesthesia Plan  ASA: 3  Anesthesia Plan: MAC   Post-op Pain Management:    Induction: Intravenous  PONV Risk Score and Plan: Propofol infusion and Treatment may vary due to age or medical condition  Airway Management Planned: Natural Airway  Additional Equipment:   Intra-op Plan:   Post-operative Plan:   Informed Consent: I have reviewed the patients History and Physical, chart, labs and discussed the procedure including the risks, benefits and alternatives for the proposed anesthesia with the patient or authorized representative who has indicated his/her understanding and acceptance.     Dental advisory given  Plan Discussed with: CRNA  Anesthesia Plan Comments:        Anesthesia Quick Evaluation

## 2022-04-05 NOTE — Op Note (Signed)
[facility] Patient Name: Ronnie Dunn Procedure Date: 04/05/2022 MRN: [id number] Attending MD: Otis Brace , MD, [id number] Date of Birth: 30-Apr-1960 CSN: [id number] Age: 62 Admit Type: Outpatient Procedure:                Upper GI endoscopy Indications:              Heartburn Providers:                Otis Brace, MD, Jaci Carrel, RN, Gloris Ham, Technician Referring MD:              Medicines:                Sedation Administered by an Anesthesia Professional Complications:            No immediate complications. Estimated Blood Loss:     Estimated blood loss was minimal. Procedure:                Pre-Anesthesia Assessment:                           - Prior to the procedure, a History and Physical                            was performed, and patient medications and                            allergies were reviewed. The patient's tolerance of                            previous anesthesia was also reviewed. The risks                            and benefits of the procedure and the sedation                            options and risks were discussed with the patient.                            All questions were answered, and informed consent                            was obtained. Prior Anticoagulants: The patient has                            taken Xarelto (rivaroxaban), last dose was 2 days                            prior to procedure. ASA Grade Assessment: III - A                            patient with severe systemic disease. After  reviewing the risks and benefits, the patient was                            deemed in satisfactory condition to undergo the                            procedure.                           After obtaining informed consent, the endoscope was                            passed under direct vision. Throughout the                            procedure, the  patient's blood pressure, pulse, and                            oxygen saturations were monitored continuously. The                            [id number] Olympus endoscope was introduced                            through the mouth, and advanced to the second part                            of duodenum. The upper GI endoscopy was                            accomplished without difficulty. The patient                            tolerated the procedure well. Scope In: Scope Out: Findings:      The Z-line was regular and was found 39 cm from the incisors.      Patchy mild inflammation characterized by congestion (edema) and       erythema was found in the entire examined stomach. Biopsies were taken       with a cold forceps for histology.      The cardia and gastric fundus were normal on retroflexion.      The duodenal bulb, first portion of the duodenum and second portion of       the duodenum were normal. Impression:               - Z-line regular, 39 cm from the incisors.                           - Gastritis. Biopsied.                           - Normal duodenal bulb, first portion of the                            duodenum and second portion of the duodenum. Moderate Sedation:  Moderate (conscious) sedation was personally administered by an       anesthesia professional. The following parameters were monitored: oxygen       saturation, heart rate, blood pressure, and response to care. Recommendation:           - Perform a colonoscopy today. Procedure Code(s):        --- Professional ---                           [phone], Esophagogastroduodenoscopy, flexible,                            transoral; with biopsy, single or multiple Diagnosis Code(s):        --- Professional ---                           K29.70, Gastritis, unspecified, without bleeding                           R12, Heartburn CPT copyright 2022 American Medical Association. All rights reserved. The codes  documented in this report are preliminary and upon coder review may  be revised to meet current compliance requirements. Otis Brace, MD Otis Brace, MD 04/05/2022 8:43:08 AM Number of Addenda: 0

## 2022-04-05 NOTE — Anesthesia Postprocedure Evaluation (Signed)
Anesthesia Post Note  Patient: Ronnie Dunn  Procedure(s) Performed: COLONOSCOPY WITH PROPOFOL ESOPHAGOGASTRODUODENOSCOPY (EGD) WITH PROPOFOL BIOPSY POLYPECTOMY     Patient location during evaluation: Endoscopy Anesthesia Type: MAC Level of consciousness: awake and alert Pain management: pain level controlled Vital Signs Assessment: post-procedure vital signs reviewed and stable Respiratory status: spontaneous breathing, nonlabored ventilation, respiratory function stable and patient connected to nasal cannula oxygen Cardiovascular status: blood pressure returned to baseline and stable Postop Assessment: no apparent nausea or vomiting Anesthetic complications: no  No notable events documented.  Last Vitals:  Vitals:   04/05/22 0837 04/05/22 0840  BP: (!) 101/58 (!) 144/79  Pulse: 75 81  Resp: 18 17  Temp:    SpO2: 90% 91%    Last Pain:  Vitals:   04/05/22 0840  TempSrc:   PainSc: 0-No pain                 Prayan Ulin L Ryin Ambrosius

## 2022-04-05 NOTE — Transfer of Care (Signed)
Immediate Anesthesia Transfer of Care Note  Patient: Ronnie Dunn  Procedure(s) Performed: COLONOSCOPY WITH PROPOFOL ESOPHAGOGASTRODUODENOSCOPY (EGD) WITH PROPOFOL BIOPSY POLYPECTOMY  Patient Location: PACU and Endoscopy Unit  Anesthesia Type:MAC  Level of Consciousness: awake  Airway & Oxygen Therapy: Patient Spontanous Breathing and Patient connected to face mask oxygen  Post-op Assessment: Report given to RN and Post -op Vital signs reviewed and stable  Post vital signs: Reviewed and stable  Last Vitals:  Vitals Value Taken Time  BP    Temp    Pulse    Resp    SpO2      Last Pain:  Vitals:   04/05/22 0638  TempSrc: Tympanic  PainSc: 0-No pain         Complications: No notable events documented.

## 2022-04-05 NOTE — H&P (Signed)
Primary Care Physician:  Lelon Perla, MD Primary Gastroenterologist:  Dr. Alessandra Bevels  Reason for Consultation: Outpatient EGD and colonoscopy  HPI: Ronnie Dunn is Dunn 62 y.o. male here for outpatient EGD and colonoscopy for positive fit test and GERD.        Patient with past medical history of paroxysmal atrial fibrillation on Xarelto, COPD currently actively smoking, sleep apnea and history of seizure disorder.        He was initially scheduled for EGD and colonoscopy in August 2023 but he had syncopal episode and fall prior to that. He was then seen by cardiology and he was deemed to be at high risk for endoscopic procedure. His EGD and colonoscopy was then canceled.        He was recently seen by cardiology in December 2023. His echocardiogram in June 2023 showed EF of 60%.        Patient denies seeing any blood in the stool or black stool. He denies any abdominal pain, nausea or vomiting. Denies reflux, trouble swallowing or pain while swallowing.        He continues to smoke.        Blood work in August 2023 showed normal hemoglobin of 15.4. Normal LFTs in June 2023.  Past Medical History:  Diagnosis Date   Chronic heart failure with preserved ejection fraction ([place]) 06/01/2021   Chronic obstructive pulmonary disease ([place]) 05/28/2021   Class 1 obesity 03/29/2020   Essential hypertension 03/29/2020   Gastroesophageal reflux disease 05/28/2021   Hyponatremia 03/29/2020   OSA (obstructive sleep apnea) 06/01/2021   Permanent atrial fibrillation ([place]) 05/28/2021   Thoracic aortic aneurysm without rupture ([place]) 06/01/2021   Type 2 diabetes mellitus without complication, without long-term current use of insulin ([place]) 05/28/2021    History reviewed. No pertinent surgical history.  Prior to Admission medications   Medication Sig Start Date End Date Taking? Authorizing Provider  albuterol (VENTOLIN HFA) 108 (90 Base) MCG/ACT inhaler Inhale 2 puffs into the lungs every 4 (four)  hours as needed for wheezing or shortness of breath. 04/21/21  Yes [provider]  allopurinol (ZYLOPRIM) 100 MG tablet Take 200 mg by mouth daily. 01/13/22  Yes [provider]  Ronnie Dunn 62.5-25 MCG/ACT AEPB Inhale 1 puff into the lungs daily. 03/23/22  Yes [provider]  atorvastatin (LIPITOR) 10 MG tablet Take 1 tablet (10 mg total) by mouth daily. 09/06/21  Yes Abonza, Maritza, PA-C  B Complex-C (SUPER B COMPLEX PO) Take 1 tablet by mouth daily.   Yes [provider]  cetirizine (ZYRTEC) 10 MG tablet Take 10 mg by mouth daily. 04/08/21  Yes [provider]  Cholecalciferol (VITAMIN D3) 50 MCG (2000 UT) TABS Take 2,000 Units by mouth daily at 6 (six) AM.   Yes [provider]  colchicine 0.6 MG tablet Take 1 tablet (0.6 mg total) by mouth 2 (two) times daily. 03/15/22 03/16/23 Yes [name], PA-C  dapagliflozin propanediol (FARXIGA) 10 MG TABS tablet Take 1 tablet (10 mg total) by mouth daily before breakfast. 06/01/21  Yes Chandrasekhar, Mahesh A, MD  famotidine (PEPCID) 40 MG tablet Take 40 mg by mouth daily.   Yes [provider]  Fluticasone Propionate, Inhal, (FLUTICASONE PROPIONATE DISKUS) 250 MCG/ACT AEPB Inhale 1 puff into the lungs daily. 03/23/22  Yes [provider]  folic acid (FOLVITE) 1 MG tablet Take 1 tablet by mouth once daily 10/26/21  Yes Abonza, Maritza, PA-C  furosemide (LASIX) 40 MG tablet Take  120 mg by mouth daily.   Yes [provider]  hydrALAZINE (APRESOLINE) 50 MG tablet Take 1 tablet (50 mg total) by mouth 3 (three) times daily. 11/01/21  Yes Abonza, Maritza, PA-C  lisinopril (ZESTRIL) 5 MG tablet Take 1 tablet by mouth once daily 10/26/21  Yes Abonza, Maritza, PA-C  magnesium oxide (MAG-OX) 400 MG tablet Take 400 mg by mouth daily.   Yes [provider]  metoprolol succinate (TOPROL-XL) 100 MG 24 hr tablet Take 150 mg by mouth daily. 05/10/21  Yes [provider]   Multiple Vitamins-Minerals (MULTIVITAMIN WITH MINERALS) tablet Take 1 tablet by mouth daily.   Yes [provider]  pantoprazole (PROTONIX) 40 MG tablet Take 1 tablet by mouth once daily 10/26/21  Yes Abonza, Maritza, PA-C  rivaroxaban (XARELTO) 20 MG TABS tablet Take 1 tablet (20 mg total) by mouth daily with supper. 06/01/21  Yes Chandrasekhar, Mahesh A, MD  sodium chloride (OCEAN) 0.65 % SOLN nasal spray Place 1 spray into both nostrils as needed for congestion.   Yes [provider]  spironolactone (ALDACTONE) 25 MG tablet Take 1 tablet (25 mg total) by mouth daily. 08/12/21  Yes Abonza, Maritza, PA-C  Tetrahydrozoline-Zn Sulfate (EYE DROPS RELIEF OP) Place 1 drop into both eyes daily as needed (redness relievee).   Yes [provider]  traZODone (DESYREL) 50 MG tablet Take 50 mg by mouth at bedtime.   Yes [provider]  Turmeric (QC TUMERIC COMPLEX) 500 MG CAPS Take 500 mg by mouth daily at 6 (six) AM.   Yes [provider]  vitamin E 180 MG (400 UNITS) capsule Take 400 Units by mouth daily.   Yes [provider]  Fluticasone-Umeclidin-Vilant (TRELEGY ELLIPTA) 100-62.5-25 MCG/ACT AEPB Inhale 1 puff into the lungs daily. Patient not taking: Reported on 03/15/2022 09/16/21   Ronnie Reid, PA-C  thiamine 100 MG tablet Take 1 tablet (100 mg total) by mouth daily. Patient not taking: Reported on 03/31/2022 08/02/21   Ronnie Reid, PA-C    Scheduled Meds: Continuous Infusions:  sodium chloride     lactated ringers 1,000 mL (04/05/22 0651)   PRN Meds:.albuterol, lactated ringers  Allergies as of 02/04/2022   (No Known Allergies)    Family History  Problem Relation Age of Onset   Hypertension Other     Social History   Socioeconomic History   Marital status: Married    Spouse name: Suanne Marker   Number of children: 2   Years of education: Engineer, building services   Highest education level: Not on file  Occupational History   Not on  file  Tobacco Use   Smoking status: Every Day    Packs/day: .5    Types: Cigarettes    Start date: 1978   Smokeless tobacco: Never  Vaping Use   Vaping Use: Never used  Substance and Sexual Activity   Alcohol use: Yes    Alcohol/week: 20.0 standard drinks of alcohol    Types: 20 Cans of beer per week   Drug use: Not Currently   Sexual activity: Yes    Partners: Female    Birth control/protection: None  Other Topics Concern   Not on file  Social History Narrative   ** Merged History Encounter **       Social Determinants of Health   Financial Resource Strain: Not on file  Food Insecurity: Not on file  Transportation Needs: Not on file  Physical Activity: Not on file  Stress: Not on file  Social Connections: Not  on file  Intimate Partner Violence: Not on file    Review of Systems: All negative except as stated above in HPI.  Physical Exam: Vital signs: Vitals:   04/05/22 0638  BP: (!) 154/82  Pulse: 75  Resp: 17  Temp: (!) 97.5 F (36.4 C)  SpO2: 96%     General:   Alert,  Well-developed, well-nourished, pleasant and cooperative in NAD Lungs: No visible respiratory distress Heart:  Regular rate and rhythm Abdomen: Soft, nontender, nondistended, bowel sounds present, no peritoneal signs Rectal:  Deferred  GI:  Lab Results: Recent Labs    04/05/22 0652  HGB 14.6  HCT 43.0   BMET Recent Labs    04/05/22 0652  NA 126*  K 4.6  CL 93*  GLUCOSE 123*  BUN 27*  CREATININE 1.10   LFT No results for input(s): "PROT", "ALBUMIN", "AST", "ALT", "ALKPHOS", "BILITOT", "BILIDIR", "IBILI" in the last 72 hours. PT/INR No results for input(s): "LABPROT", "INR" in the last 72 hours.   Studies/Results: No results found.  Impression/Plan: -Positive fit -GERD -History of atrial fibrillation.  Xarelto on hold for 2 days.  Recommendations -------------------------- -Proceed with EGD and colonoscopy today.  Risks (bleeding, infection, bowel perforation  that could require surgery, sedation-related changes in cardiopulmonary systems), benefits (identification and possible treatment of source of symptoms, exclusion of certain causes of symptoms), and alternatives (watchful waiting, radiographic imaging studies, empiric medical treatment)  were explained to patient/family in detail and patient wishes to proceed.     LOS: 0 days   Otis Brace  MD, FACP 04/05/2022, 7:48 AM  Contact #  [phone]

## 2022-04-06 LAB — SURGICAL PATHOLOGY

## 2022-04-16 IMAGING — CR DG CHEST 2V
1 series · 1 of 1 positions shown · non-contrast
Comparison: None.

CLINICAL DATA: Syncope

EXAM:
CHEST - 2 VIEW

[chest lat]
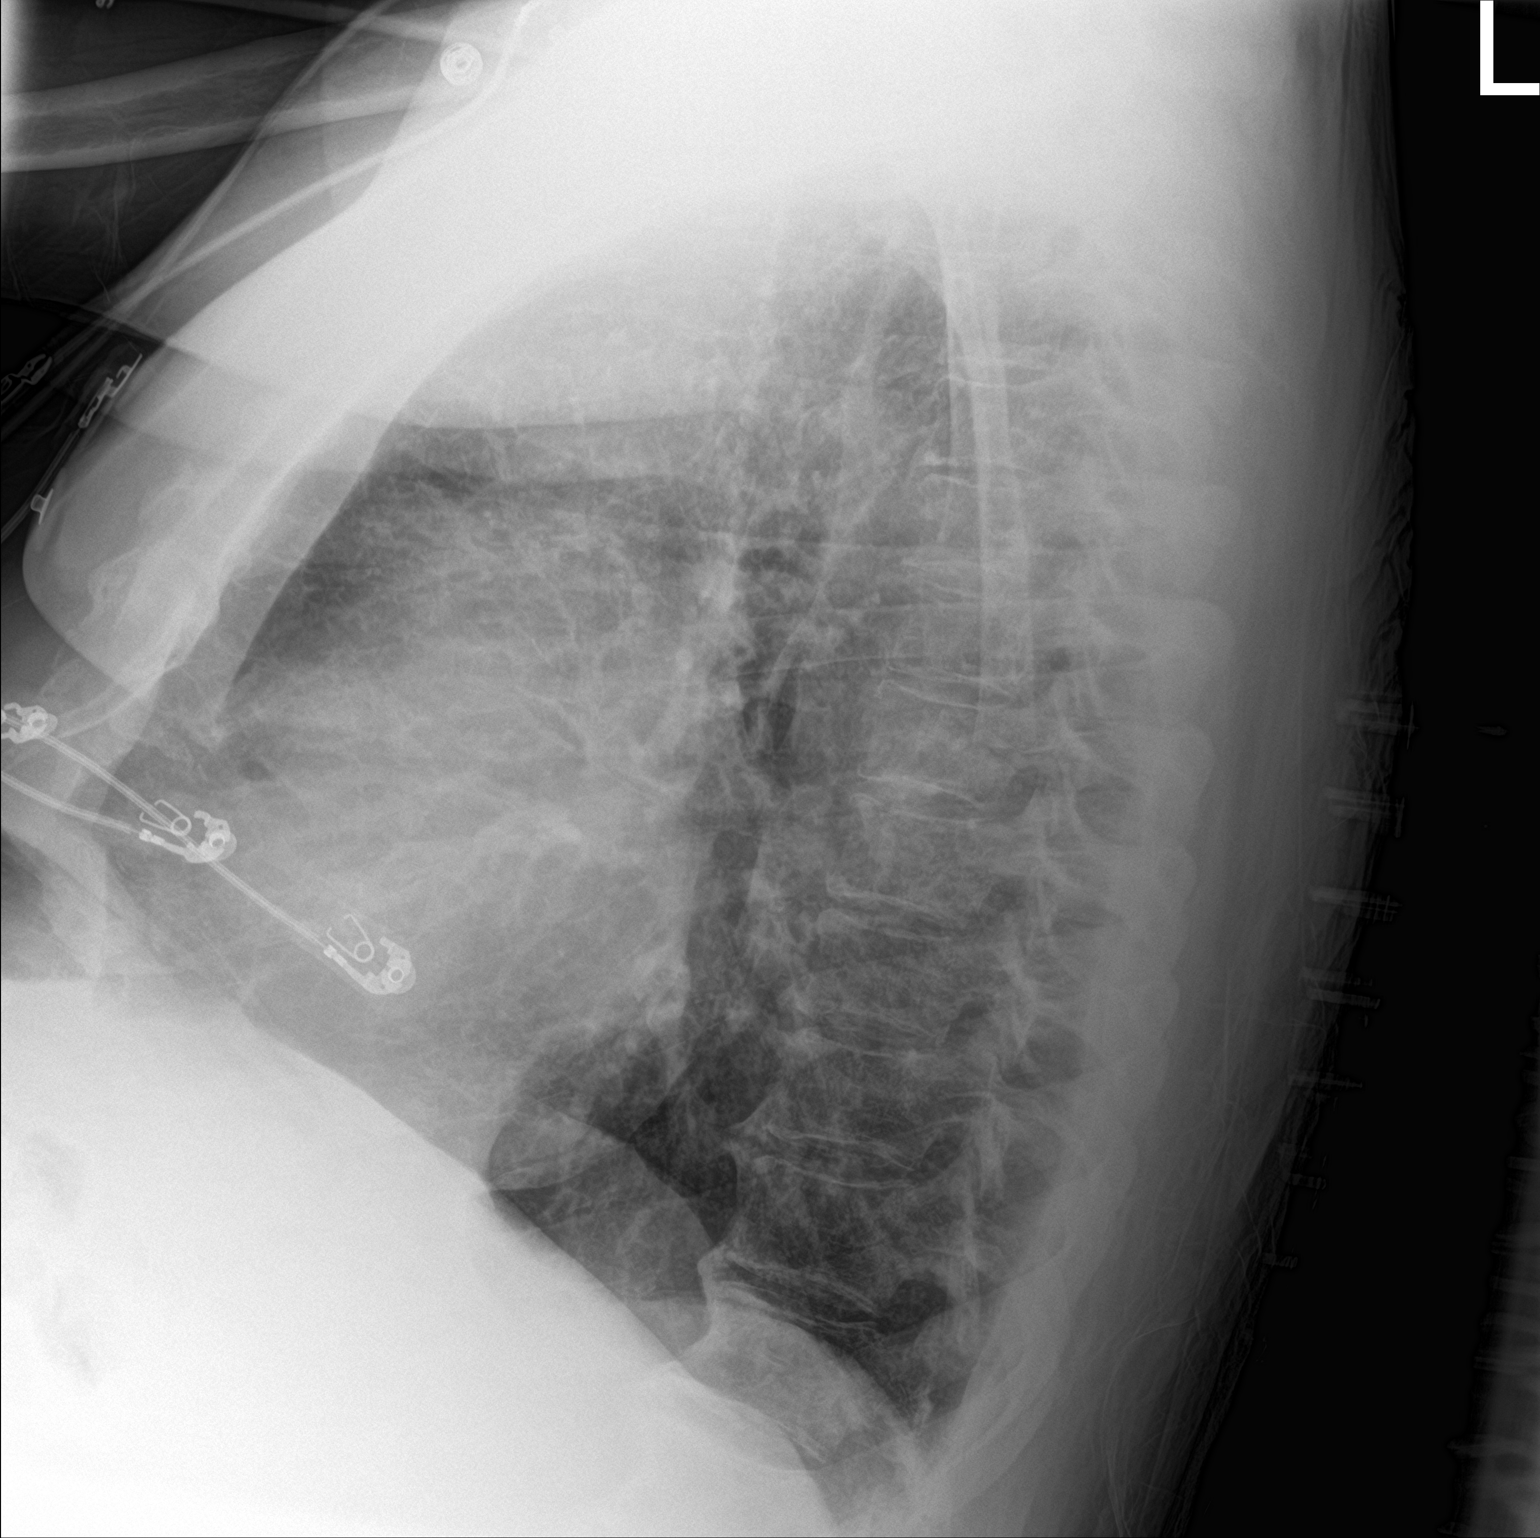

[1 of 1 positions shown; findings below may reference images not displayed]

FINDINGS: Cardiomegaly. Both lungs are clear. Disc degenerative disease of the
thoracic spine.
IMPRESSION: Cardiomegaly without acute abnormality of the lungs.

## 2022-05-02 ENCOUNTER — Ambulatory Visit (HOSPITAL_BASED_OUTPATIENT_CLINIC_OR_DEPARTMENT_OTHER): Payer: Medicaid Other | Attending: Cardiology | Admitting: Cardiology

## 2022-05-02 VITALS — Ht 74.0 in | Wt 260.0 lb

## 2022-05-02 DIAGNOSIS — G4733 Obstructive sleep apnea (adult) (pediatric): Secondary | ICD-10-CM | POA: Diagnosis not present

## 2022-05-03 ENCOUNTER — Ambulatory Visit (HOSPITAL_BASED_OUTPATIENT_CLINIC_OR_DEPARTMENT_OTHER): Payer: Medicaid Other | Attending: Cardiology | Admitting: Cardiology

## 2022-05-08 NOTE — Procedures (Signed)
   Patient Name: Ronnie Dunn, Ronnie Dunn Date: 05/03/2022 Gender: Male D.O.B: 1960/06/14 Age (years): 30 Referring Provider: Armanda Magic MD, ABSM Height (inches): 74 Interpreting Physician: Armanda Magic MD, ABSM Weight (lbs): 260 RPSGT: Flowella Sink BMI: 33 MRN: [id number] Neck Size: 17.50  CLINICAL INFORMATION Sleep Study Type: HST  Indication for sleep study: N/A  Epworth Sleepiness Score: 6  SLEEP STUDY TECHNIQUE A multi-channel overnight portable sleep study was performed. The channels recorded were: nasal airflow, thoracic respiratory movement, and oxygen saturation with a pulse oximetry. Snoring was also monitored.  MEDICATIONS Patient self administered medications include: N/A.  SLEEP ARCHITECTURE Patient was studied for 373.2 minutes. The sleep efficiency was 100.0 % and the patient was supine for 0%. The arousal index was 0.0 per hour.  RESPIRATORY PARAMETERS The overall AHI was 9.0 per hour, with a central apnea index of 0 per hour.  The oxygen nadir was 86% during sleep.  CARDIAC DATA Mean heart rate during sleep was 69.1 bpm.  IMPRESSIONS - Mild obstructive sleep apnea occurred during this study (AHI = 9.0/h). - Moderate oxygen desaturation was noted during this study (Min O2 = 86%). - Patient snored 37% during the sleep.  DIAGNOSIS - Obstructive Sleep Apnea (G47.33)  RECOMMENDATIONS - Recommend ResMed auto CPAP from 4 to 15cm H2O with heated humidity and mask of choice.  - Avoid alcohol, sedatives and other CNS depressants that may worsen sleep apnea and disrupt normal sleep architecture. - Sleep hygiene should be reviewed to assess factors that may improve sleep quality. - Weight management and regular exercise should be initiated or continued. - Return to Sleep Center in 6 weeks.  [Electronically signed] 05/08/2022 07:17 PM  Armanda Magic MD, ABSM Diplomate, American Board of Sleep Medicine

## 2022-05-16 ENCOUNTER — Telehealth: Payer: Self-pay | Admitting: *Deleted

## 2022-05-16 DIAGNOSIS — R400 Somnolence: Secondary | ICD-10-CM

## 2022-05-16 DIAGNOSIS — G4733 Obstructive sleep apnea (adult) (pediatric): Secondary | ICD-10-CM

## 2022-05-16 DIAGNOSIS — I10 Essential (primary) hypertension: Secondary | ICD-10-CM

## 2022-05-16 DIAGNOSIS — J449 Chronic obstructive pulmonary disease, unspecified: Secondary | ICD-10-CM

## 2022-05-16 NOTE — Telephone Encounter (Addendum)
The patient has been notified of the result and verbalized understanding.  All questions (if any) were answered. [name], CMA 05/16/2022 3:42 PM    auto CPAP from 4-15cm H2O with heated humidity and mask of choice.     Order overnight pulse ox on CPAP.  Followup with me in 6 weeks.   Upon patient request DME selection is [facility]. Patient understands he will be contacted by [facility] to set up his cpap. Patient understands to call if [facility] does not contact him with new setup in a timely manner. Patient understands they will be called once confirmation has been received from [facility]/ that they have received their new machine to schedule 10 week follow up appointment.   [facility] notified of new cpap order  Please add to airview Patient was grateful for the call and thanked me.

## 2022-05-16 NOTE — Telephone Encounter (Signed)
-----   Message from Gaynelle Cage, [place] sent at 05/09/2022  8:05 AM EDT -----  ----- Message ----- From: Quintella Reichert, MD Sent: 05/08/2022   7:19 PM EDT To: [facility] Sleep Studies  Please let patient know that they have sleep apnea and recommend treating with CPAP.  Please order an auto CPAP from 4-15cm H2O with heated humidity and mask of choice.  Order overnight pulse ox on CPAP.  Followup with me in 6 weeks.

## 2022-05-27 ENCOUNTER — Telehealth: Payer: Self-pay | Admitting: Internal Medicine

## 2022-05-27 NOTE — Telephone Encounter (Signed)
Called spoke with pt spouse.  Reports pt Medicaid ends the last day of May.  Would like to see if pt could be seen before this date. Spouse thinks pt will qualify for Medicare in Oct.  Advised MD is not in the office.  Will speak with MD on Monday to see if pt can be rescheduled. Will call back with reply.

## 2022-05-27 NOTE — Telephone Encounter (Signed)
Patient's spouse is calling because the patient has an appt on 06/11 with Dr. Izora Ribas. Patient's spouse stated that patient's insurance is going to end in May. She would like to know if the patient could be seen in May instead of June. Please advise.

## 2022-05-30 NOTE — Telephone Encounter (Signed)
Called pt spouse to offer OV today 05/30/22 at 3:20 pm.  Pt does not want to come in today.  OV declined.

## 2022-06-25 ENCOUNTER — Other Ambulatory Visit: Payer: Self-pay | Admitting: Internal Medicine

## 2022-06-27 NOTE — Telephone Encounter (Signed)
Prescription refill request for Xarelto received.  Indication:afib Last office visit:2/24 Weight:117.9  kg Age:62 Scr:1.10  3/24 CrCl:116.11  ml/min  Prescription refilled

## 2022-06-28 ENCOUNTER — Encounter: Payer: Self-pay | Admitting: Internal Medicine

## 2022-06-28 ENCOUNTER — Ambulatory Visit: Payer: Medicaid Other | Attending: Internal Medicine | Admitting: Internal Medicine

## 2022-06-28 VITALS — BP 130/62 | HR 78 | Ht 74.0 in | Wt 267.0 lb

## 2022-06-28 DIAGNOSIS — F1020 Alcohol dependence, uncomplicated: Secondary | ICD-10-CM

## 2022-06-28 DIAGNOSIS — I4821 Permanent atrial fibrillation: Secondary | ICD-10-CM

## 2022-06-28 DIAGNOSIS — I10 Essential (primary) hypertension: Secondary | ICD-10-CM | POA: Diagnosis not present

## 2022-06-28 DIAGNOSIS — F1010 Alcohol abuse, uncomplicated: Secondary | ICD-10-CM

## 2022-06-28 DIAGNOSIS — D6869 Other thrombophilia: Secondary | ICD-10-CM | POA: Insufficient documentation

## 2022-06-28 DIAGNOSIS — I77810 Thoracic aortic ectasia: Secondary | ICD-10-CM | POA: Insufficient documentation

## 2022-06-28 DIAGNOSIS — I5032 Chronic diastolic (congestive) heart failure: Secondary | ICD-10-CM | POA: Diagnosis not present

## 2022-06-28 DIAGNOSIS — F17210 Nicotine dependence, cigarettes, uncomplicated: Secondary | ICD-10-CM

## 2022-06-28 DIAGNOSIS — J438 Other emphysema: Secondary | ICD-10-CM

## 2022-06-28 DIAGNOSIS — G4733 Obstructive sleep apnea (adult) (pediatric): Secondary | ICD-10-CM

## 2022-06-28 NOTE — Progress Notes (Signed)
Cardiology Office Note:    Date:  06/28/2022   ID:  Ronnie Dunn, DOB 12/31/1960, MRN [id number]  PCP:  Lewayne Bunting, MD   [facility] Providers Cardiologist:  Christell Constant, MD     Referring MD: Mayer Masker, PA-C   CC: HFpEF  History of Present Illness:    Ronnie Dunn is a 62 y.o. male with a hx of PAF, COPD, still actively smoking, blood in stool, HTN with DM, alcohol use, OSA, who presents for evaluation 06/01/21. 2023: Preoperative low risk.  Added metolazone and SGLT2i.  Cut down to 7 beers. Aorta mild dilation. 2024: Had syncopal episode and saw PA Lenze. Had insurance concerns. Normal stress test  Patient notes that he is doing OK.   Had 14 polyps removed with colonoscopy.  He has intentionally has lost weight. Down to 4-5 beers on most days. No change in his smoking. There are no interval hospital/ED visit.   Back on Medicaid.  No chest pain or pressure .  No SOB/DOE and no PND/Orthopnea.  Got his CPAP.  Started lifting weights again. No palpitations.  No further syncope.   Past Medical History:  Diagnosis Date   Chronic heart failure with preserved ejection fraction (HCC) 06/01/2021   Chronic obstructive pulmonary disease (HCC) 05/28/2021   Class 1 obesity 03/29/2020   Essential hypertension 03/29/2020   Gastroesophageal reflux disease 05/28/2021   Hyponatremia 03/29/2020   OSA (obstructive sleep apnea) 06/01/2021   Permanent atrial fibrillation (HCC) 05/28/2021   Thoracic aortic aneurysm without rupture (HCC) 06/01/2021   Type 2 diabetes mellitus without complication, without long-term current use of insulin (HCC) 05/28/2021    Past Surgical History:  Procedure Laterality Date   BIOPSY  04/05/2022   Procedure: BIOPSY;  Surgeon: Kathi Der, MD;  Location: [facility];  Service: Gastroenterology;;   COLONOSCOPY WITH PROPOFOL N/A 04/05/2022   Procedure: COLONOSCOPY WITH PROPOFOL;  Surgeon: Kathi Der, MD;  Location: [facility];  Service: Gastroenterology;  Laterality: N/A;   ESOPHAGOGASTRODUODENOSCOPY (EGD) WITH PROPOFOL N/A 04/05/2022   Procedure: ESOPHAGOGASTRODUODENOSCOPY (EGD) WITH PROPOFOL;  Surgeon: Kathi Der, MD;  Location: [facility];  Service: Gastroenterology;  Laterality: N/A;   POLYPECTOMY  04/05/2022   Procedure: POLYPECTOMY;  Surgeon: Kathi Der, MD;  Location: [facility];  Service: Gastroenterology;;    Current Medications: Current Meds  Medication Sig   albuterol (VENTOLIN HFA) 108 (90 Base) MCG/ACT inhaler Inhale 2 puffs into the lungs every 4 (four) hours as needed for wheezing or shortness of breath.   atorvastatin (LIPITOR) 10 MG tablet Take 1 tablet (10 mg total) by mouth daily.   B Complex-C (SUPER B COMPLEX PO) Take 1 tablet by mouth daily.   cetirizine (ZYRTEC) 10 MG tablet Take 10 mg by mouth daily.   Cholecalciferol (VITAMIN D3) 50 MCG (2000 UT) TABS Take 2,000 Units by mouth daily at 6 (six) AM.   colchicine 0.6 MG tablet Take 1 tablet (0.6 mg total) by mouth 2 (two) times daily. (Patient taking differently: Take 0.6 mg by mouth as needed (gout).)   dapagliflozin propanediol (FARXIGA) 10 MG TABS tablet Take 1 tablet (10 mg total) by mouth daily before breakfast.   famotidine (PEPCID) 40 MG tablet Take 40 mg by mouth daily.   Fluticasone-Umeclidin-Vilant (TRELEGY ELLIPTA) 100-62.5-25 MCG/ACT AEPB Inhale 1 puff into the lungs daily.   folic acid (FOLVITE) 1 MG tablet Take 1 tablet by mouth once daily   furosemide (LASIX) 40 MG tablet Take 120 mg by mouth daily.  hydrALAZINE (APRESOLINE) 50 MG tablet Take 1 tablet (50 mg total) by mouth 3 (three) times daily.   lisinopril (ZESTRIL) 5 MG tablet Take 1 tablet by mouth once daily   magnesium oxide (MAG-OX) 400 MG tablet Take 400 mg by mouth daily.   metoprolol succinate (TOPROL-XL) 100 MG 24 hr tablet Take 150 mg by mouth daily.   Multiple Vitamins-Minerals (MULTIVITAMIN WITH MINERALS) tablet Take 1 tablet by mouth  daily.   pantoprazole (PROTONIX) 40 MG tablet Take 1 tablet by mouth once daily   rivaroxaban (XARELTO) 20 MG TABS tablet TAKE 1 TABLET BY MOUTH ONCE DAILY WITH SUPPER   sodium chloride (OCEAN) 0.65 % SOLN nasal spray Place 1 spray into both nostrils as needed for congestion.   spironolactone (ALDACTONE) 25 MG tablet Take 1 tablet (25 mg total) by mouth daily.   Tetrahydrozoline-Zn Sulfate (EYE DROPS RELIEF OP) Place 1 drop into both eyes daily as needed (redness relievee).   traZODone (DESYREL) 50 MG tablet Take 50 mg by mouth at bedtime.   Turmeric (QC TUMERIC COMPLEX) 500 MG CAPS Take 500 mg by mouth daily at 6 (six) AM.   vitamin E 180 MG (400 UNITS) capsule Take 400 Units by mouth daily.   [DISCONTINUED] allopurinol (ZYLOPRIM) 300 MG tablet Take 300 mg by mouth daily.     Allergies:   Patient has no known allergies.   Social History   Socioeconomic History   Marital status: Married    Spouse name: Bjorn Loser   Number of children: 2   Years of education: Land education level: Not on file  Occupational History   Not on file  Tobacco Use   Smoking status: Every Day    Packs/day: .5    Types: Cigarettes    Start date: 1978   Smokeless tobacco: Never  Vaping Use   Vaping Use: Never used  Substance and Sexual Activity   Alcohol use: Yes    Alcohol/week: 20.0 standard drinks of alcohol    Types: 20 Cans of beer per week   Drug use: Not Currently   Sexual activity: Yes    Partners: Female    Birth control/protection: None  Other Topics Concern   Not on file  Social History Narrative   ** Merged History Encounter **       Social Determinants of Health   Financial Resource Strain: Not on file  Food Insecurity: Not on file  Transportation Needs: Not on file  Physical Activity: Not on file  Stress: Not on file  Social Connections: Not on file    Social: has worked 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  Applied for disability in 2023 Comes  with his wife  Family History: Brother has atrial fibrillation. Mother has pacemaker  ROS:   Please see the history of present illness.     EKGs/Labs/Other Studies Reviewed:    The following studies were reviewed today:  EKG:   12/20/21: atrial fibrillation 08/18/21: Atrial fibrillation 06/01/21:  AF rates 72   Cardiac Studies & Procedures     STRESS TESTS  MYOCARDIAL PERFUSION IMAGING 03/22/2022  Narrative   The study is normal. The study is low risk.   No ST deviation was noted.   LV perfusion is normal. There is no evidence of ischemia. There is no evidence of infarction.   Left ventricular function is normal. Nuclear stress EF: 55 %. The left ventricular ejection fraction is normal (55-65%). End diastolic cavity size is normal. End  systolic cavity size is normal.   Prior study not available for comparison.  Normal stress nuclear study with apical thinning but no ischemia; gated EF 55 with normal wall motion.   ECHOCARDIOGRAM  ECHOCARDIOGRAM COMPLETE 06/22/2021  Narrative ECHOCARDIOGRAM REPORT    Patient Name:   [facility] Date of Exam: 06/22/2021 Medical Rec #:  [id number]     Height:       74.0 in Accession #:    [id number]    Weight:       266.8 lb Date of Birth:  14-Mar-1960     BSA:          2.456 m Patient Age:    60 years      BP:           126/66 mmHg Patient Gender: M             HR:           88 bpm. Exam Location:  Inpatient  Procedure: 2D Echo, Cardiac Doppler and Color Doppler  Indications:    Afib  History:        Patient has prior history of Echocardiogram examinations, most recent 03/29/2020. CHF, COPD; Risk Factors:Hypertension and Diabetes.  Sonographer:    Rodrigo Ran RCS Referring Phys: [id number] DAVID MANUEL ORTIZ   Sonographer Comments: Patient is morbidly obese. IMPRESSIONS   1. Left ventricular ejection fraction, by estimation, is 60 to 65%. The left ventricle has normal function. The left ventricle has no regional wall motion  abnormalities. There is mild concentric left ventricular hypertrophy. Left ventricular diastolic function could not be evaluated. 2. Right ventricular systolic function is normal. The right ventricular size is normal. Tricuspid regurgitation signal is inadequate for assessing PA pressure. 3. The mitral valve is grossly normal. Trivial mitral valve regurgitation. No evidence of mitral stenosis. 4. The aortic valve is tricuspid. Aortic valve regurgitation is not visualized. No aortic stenosis is present. 5. Aortic dilatation noted. There is mild dilatation of the aortic root, measuring 40 mm. There is mild dilatation of the ascending aorta, measuring 42 mm. 6. The inferior vena cava is dilated in size with >50% respiratory variability, suggesting right atrial pressure of 8 mmHg.  FINDINGS Left Ventricle: Left ventricular ejection fraction, by estimation, is 60 to 65%. The left ventricle has normal function. The left ventricle has no regional wall motion abnormalities. The left ventricular internal cavity size was normal in size. There is mild concentric left ventricular hypertrophy. Left ventricular diastolic function could not be evaluated due to atrial fibrillation. Left ventricular diastolic function could not be evaluated.  Right Ventricle: The right ventricular size is normal. No increase in right ventricular wall thickness. Right ventricular systolic function is normal. Tricuspid regurgitation signal is inadequate for assessing PA pressure.  Left Atrium: Left atrial size was normal in size.  Right Atrium: Right atrial size was normal in size.  Pericardium: There is no evidence of pericardial effusion. Presence of epicardial fat layer.  Mitral Valve: The mitral valve is grossly normal. Trivial mitral valve regurgitation. No evidence of mitral valve stenosis.  Tricuspid Valve: The tricuspid valve is grossly normal. Tricuspid valve regurgitation is trivial. No evidence of tricuspid  stenosis.  Aortic Valve: The aortic valve is tricuspid. Aortic valve regurgitation is not visualized. No aortic stenosis is present. Aortic valve mean gradient measures 5.0 mmHg. Aortic valve peak gradient measures 9.1 mmHg. Aortic valve area, by VTI measures 2.69 cm.  Pulmonic Valve: The pulmonic valve was grossly normal. Pulmonic valve  regurgitation is not visualized. No evidence of pulmonic stenosis.  Aorta: Aortic dilatation noted. There is mild dilatation of the aortic root, measuring 40 mm. There is mild dilatation of the ascending aorta, measuring 42 mm.  Venous: The inferior vena cava is dilated in size with greater than 50% respiratory variability, suggesting right atrial pressure of 8 mmHg.  IAS/Shunts: The atrial septum is grossly normal.   LEFT VENTRICLE PLAX 2D LVIDd:         4.80 cm   Diastology LVIDs:         3.10 cm   LV e' medial:   9.57 cm/s LV PW:         1.50 cm   LV E/e' medial: 11.8 LV IVS:        1.40 cm LVOT diam:     2.20 cm LV SV:         71 LV SV Index:   29 LVOT Area:     3.80 cm   RIGHT VENTRICLE             IVC RV Basal diam:  3.60 cm     IVC diam: 2.10 cm RV Mid diam:    2.00 cm RV S prime:     14.80 cm/s TAPSE (M-mode): 2.5 cm  LEFT ATRIUM             Index        RIGHT ATRIUM           Index LA diam:        3.60 cm 1.47 cm/m   RA Area:     21.80 cm LA Vol (A2C):   99.0 ml 40.30 ml/m  RA Volume:   64.50 ml  26.26 ml/m LA Vol (A4C):   54.6 ml 22.23 ml/m LA Biplane Vol: 74.6 ml 30.37 ml/m AORTIC VALVE AV Area (Vmax):    2.45 cm AV Area (Vmean):   2.54 cm AV Area (VTI):     2.69 cm AV Vmax:           151.00 cm/s AV Vmean:          109.000 cm/s AV VTI:            0.263 m AV Peak Grad:      9.1 mmHg AV Mean Grad:      5.0 mmHg LVOT Vmax:         97.50 cm/s LVOT Vmean:        72.700 cm/s LVOT VTI:          0.186 m LVOT/AV VTI ratio: 0.71  AORTA Ao Root diam: 4.00 cm Ao Asc diam:  4.20 cm  MITRAL VALVE MV Area (PHT): 5.54 cm      SHUNTS MV Decel Time: 137 msec     Systemic VTI:  0.19 m MV E velocity: 113.00 cm/s  Systemic Diam: 2.20 cm  Lennie Odor MD Electronically signed by Lennie Odor MD Signature Date/Time: 06/22/2021/3:45:57 PM    Final              Recent Labs: 07/07/2021: ALT 5 08/18/2021: NT-Pro BNP 1,297; Platelets 512 04/05/2022: BUN 27; Creatinine, Ser 1.10; Hemoglobin 14.6; Potassium 4.6; Sodium 126  Recent Lipid Panel No results found for: "CHOL", "TRIG", "HDL", "CHOLHDL", "VLDL", "LDLCALC", "LDLDIRECT"  Physical Exam:    VS:  BP 130/62   Pulse 78   Ht 6\' 2"  (1.88 m)   Wt 267 lb (121.1 kg)   SpO2 97%   BMI 34.28 kg/m  Wt Readings from Last 3 Encounters:  06/28/22 267 lb (121.1 kg)  05/02/22 260 lb (117.9 kg)  04/05/22 260 lb (117.9 kg)    Gen: No distress, obese male  smells of smoke Neck: No JVD,  Cardiac: No Rubs or Gallops, IRIR +2 radial pulses, distant heart sounds Respiratory: CTAB normal effort, normal  respiratory rate GI: Soft, nontender, distended with slight fluid wave MS: trace edema;  moves all extremities Integument: Skin feels warm Neuro:  At time of evaluation, alert and oriented to person/place/time/situation  Psych: Normal affect, patient feels fine  ASSESSMENT:    1. Chronic heart failure with preserved ejection fraction (HCC)   2. Permanent atrial fibrillation (HCC)   3. Essential hypertension   4. Other emphysema (HCC)   5. Aortic root dilation (HCC)   6. OSA (obstructive sleep apnea)   7. Alcohol abuse   8. Acquired thrombophilia (HCC)     PLAN:    Permanent Atrial Fibrillation OSA- now on CPAP - Risk factors include HTN, DM, Alcohol Use, COPD - CHADSVASC=3. - continue metoprolol 150 mg PO daily - when sober we may consider rhythm control - continue DOAC, acquired thrombophilia  Chronic Heart Failure Preserved Ejection Fraction  Gout - NYHA class It, Stage C, euvolemic - Diuretic regimen: lasix 120 mg PO  - Discussed the  importance of fluid restriction of < 2 L, salt restriction, and checking daily weights  - Farxiga 10 mg PO daily - spironolactone 25 mg - As he continues to lose weight, we may cut his hydralazine dosing and come off of this first  HLD with DM - atorvastatin 10 mg  - lisinopril 5 mg   Tobacco abuse COPD - pre-contemplative to smoking  Mild aortic dilation  - mild in 2023 - echo   Herma [facility] in six months Me in one year If stops drinking; EP or AF clinic visit for consideration of rhythm control        Medication Adjustments/Labs and Tests Ordered: Current medicines are reviewed at length with the patient today.  Concerns regarding medicines are outlined above.  Orders Placed This Encounter  Procedures   Basic metabolic panel   ECHOCARDIOGRAM COMPLETE   No orders of the defined types were placed in this encounter.   Patient Instructions  Medication Instructions:  Your physician recommends that you continue on your current medications as directed. Please refer to the Current Medication list given to you today.  *If you need a refill on your cardiac medications before your next appointment, please call your pharmacy*   Lab Work: BMP  If you have labs (blood work) drawn today and your tests are completely normal, you will receive your results only by: MyChart Message (if you have MyChart) OR A paper copy in the mail If you have any lab test that is abnormal or we need to change your treatment, we will call you to review the results.   Testing/Procedures: Your physician has requested that you have an echocardiogram. Echocardiography is a painless test that uses sound waves to create images of your heart. It provides your doctor with information about the size and shape of your heart and how well your heart's chambers and valves are working. This procedure takes approximately one hour. There are no restrictions for this procedure. Please do NOT wear cologne,  perfume, aftershave, or lotions (deodorant is allowed). Please arrive 15 minutes prior to your appointment time.    Follow-Up: At [facility], you and your health needs are  our priority.  As part of our continuing mission to provide you with exceptional heart care, we have created designated Provider Care Teams.  These Care Teams include your primary Cardiologist (physician) and Advanced Practice Providers (APPs -  Physician Assistants and Nurse Practitioners) who all work together to provide you with the care you need, when you need it.   Your next appointment:   6 month(s)  Provider:   Jacolyn Reedy, PA-C     Then, Christell Constant, MD will plan to see you again in 1 year(s).        Signed, Christell Constant, MD  06/28/2022 9:33 AM    [facility]

## 2022-06-28 NOTE — Patient Instructions (Signed)
Medication Instructions:  Your physician recommends that you continue on your current medications as directed. Please refer to the Current Medication list given to you today.  *If you need a refill on your cardiac medications before your next appointment, please call your pharmacy*   Lab Work: BMP  If you have labs (blood work) drawn today and your tests are completely normal, you will receive your results only by: MyChart Message (if you have MyChart) OR A paper copy in the mail If you have any lab test that is abnormal or we need to change your treatment, we will call you to review the results.   Testing/Procedures: Your physician has requested that you have an echocardiogram. Echocardiography is a painless test that uses sound waves to create images of your heart. It provides your doctor with information about the size and shape of your heart and how well your heart's chambers and valves are working. This procedure takes approximately one hour. There are no restrictions for this procedure. Please do NOT wear cologne, perfume, aftershave, or lotions (deodorant is allowed). Please arrive 15 minutes prior to your appointment time.    Follow-Up: At [facility], you and your health needs are our priority.  As part of our continuing mission to provide you with exceptional heart care, we have created designated Provider Care Teams.  These Care Teams include your primary Cardiologist (physician) and Advanced Practice Providers (APPs -  Physician Assistants and Nurse Practitioners) who all work together to provide you with the care you need, when you need it.   Your next appointment:   6 month(s)  Provider:   Jacolyn Reedy, PA-C     Then, Christell Constant, MD will plan to see you again in 1 year(s).

## 2022-06-29 LAB — BASIC METABOLIC PANEL
BUN/Creatinine Ratio: 28 — ABNORMAL HIGH (ref 10–24)
BUN: 38 mg/dL — ABNORMAL HIGH (ref 8–27)
CO2: 23 mmol/L (ref 20–29)
Calcium: 9.2 mg/dL (ref 8.6–10.2)
Chloride: 90 mmol/L — ABNORMAL LOW (ref 96–106)
Creatinine, Ser: 1.36 mg/dL — ABNORMAL HIGH (ref 0.76–1.27)
Glucose: 137 mg/dL — ABNORMAL HIGH (ref 70–99)
Potassium: 4.8 mmol/L (ref 3.5–5.2)
Sodium: 128 mmol/L — ABNORMAL LOW (ref 134–144)
eGFR: 59 mL/min/{1.73_m2} — ABNORMAL LOW (ref >59)

## 2022-07-14 ENCOUNTER — Encounter: Payer: Self-pay | Admitting: Cardiology

## 2022-07-22 ENCOUNTER — Ambulatory Visit (HOSPITAL_COMMUNITY): Payer: Medicaid Other | Attending: Cardiology

## 2022-07-22 DIAGNOSIS — I5032 Chronic diastolic (congestive) heart failure: Secondary | ICD-10-CM | POA: Diagnosis not present

## 2022-07-22 LAB — ECHOCARDIOGRAM COMPLETE
Est EF: 55
S' Lateral: 2.7 cm

## 2022-08-16 ENCOUNTER — Ambulatory Visit: Payer: Medicaid Other | Attending: Cardiology | Admitting: Cardiology

## 2022-08-16 ENCOUNTER — Encounter: Payer: Self-pay | Admitting: Cardiology

## 2022-08-16 VITALS — BP 132/78 | HR 82 | Ht 74.0 in | Wt 257.0 lb

## 2022-08-16 DIAGNOSIS — J449 Chronic obstructive pulmonary disease, unspecified: Secondary | ICD-10-CM

## 2022-08-16 DIAGNOSIS — I10 Essential (primary) hypertension: Secondary | ICD-10-CM | POA: Diagnosis not present

## 2022-08-16 DIAGNOSIS — G4733 Obstructive sleep apnea (adult) (pediatric): Secondary | ICD-10-CM

## 2022-08-16 NOTE — Patient Instructions (Signed)
Medication Instructions:  Dr. Mayford Knife has ordered that you start using 2 Liters of Oxygen with your Cpap at night. Someone will call you from your DME company to set this up.   *If you need a refill on your cardiac medications before your next appointment, please call your pharmacy*   Lab Work: None.  If you have labs (blood work) drawn today and your tests are completely normal, you will receive your results only by: MyChart Message (if you have MyChart) OR A paper copy in the mail If you have any lab test that is abnormal or we need to change your treatment, we will call you to review the results.   Testing/Procedures: Dr. Mayford Knife has ordered an overnight oximetry study for you.  This study should be performed while you are using your cpap with 2L of oxygen at night. Someone will call you to set this study up.    Follow-Up: At [facility], you and your health needs are our priority.  As part of our continuing mission to provide you with exceptional heart care, we have created designated Provider Care Teams.  These Care Teams include your primary Cardiologist (physician) and Advanced Practice Providers (APPs -  Physician Assistants and Nurse Practitioners) who all work together to provide you with the care you need, when you need it.  We recommend signing up for the patient portal called "MyChart".  Sign up information is provided on this After Visit Summary.  MyChart is used to connect with patients for Virtual Visits (Telemedicine).  Patients are able to view lab/test results, encounter notes, upcoming appointments, etc.  Non-urgent messages can be sent to your provider as well.   To learn more about what you can do with MyChart, go to [email or web address].    Your next appointment:   1 year(s)  Provider:   Dr. Armanda Magic, MD

## 2022-08-16 NOTE — Addendum Note (Signed)
Addended by: Luellen Pucker on: 08/16/2022 10:05 AM   Modules accepted: Orders

## 2022-08-16 NOTE — Progress Notes (Signed)
Sleep Medicine CONSULT Note    Date:  08/16/2022   ID:  Ronnie Dunn, DOB [birth year], MRN [id number]  PCP:  Lewayne Bunting, MD  Cardiologist: Christell Constant, MD   Chief Complaint  Patient presents with   New Patient (Initial Visit)    Obstructive sleep apnea    History of Present Illness:  Ronnie Dunn is a 62 y.o. male who is being seen today for the evaluation of obstructive sleep at the request of Wynell Balloon, [place].  This is a 62 year old male with a history of Paroxysmal atrial fibrillation, COPD, ongoing tobacco abuse, hypertension, diabetes mellitus, alcohol abuse.  He recently saw Wynell Balloon, PA back in the winter and complained of excessive daytime sleepiness With a STOP-BANG score of 7.   She ordered a home sleep study which demonstrated mild obstructive sleep apnea with an AHI of 9/h.  He was started on auto CPAP from 4 to 15 cm H2O.  He is now referred for sleep medicine consultation to establish sleep care.  he is doing well with his PAP device and thinks that he has gotten used to it.  He tolerates the FF mask and feels the pressure is adequate.  Since going on PAP he feels rested in the am and has no significant daytime sleepiness.  He denies any significant mouth or nasal dryness but has been having some nasal congestion.  He has tried nasal saline spray and Flonase but still has problems with allergies and allergic rhinitis. He does not think that he snores.    Past Medical History:  Diagnosis Date   Chronic heart failure with preserved ejection fraction (HCC) 06/01/2021   Chronic obstructive pulmonary disease (HCC) 05/28/2021   Class 1 obesity 03/29/2020   Essential hypertension 03/29/2020   Gastroesophageal reflux disease 05/28/2021   Hyponatremia 03/29/2020   OSA (obstructive sleep apnea) 06/01/2021   Permanent atrial fibrillation (HCC) 05/28/2021   Thoracic aortic aneurysm without rupture (HCC) 06/01/2021   Type 2 diabetes mellitus without  complication, without long-term current use of insulin (HCC) 05/28/2021    Past Surgical History:  Procedure Laterality Date   BIOPSY  04/05/2022   Procedure: BIOPSY;  Surgeon: Kathi Der, MD;  Location: [facility];  Service: Gastroenterology;;   COLONOSCOPY WITH PROPOFOL N/A 04/05/2022   Procedure: COLONOSCOPY WITH PROPOFOL;  Surgeon: Kathi Der, MD;  Location: [facility];  Service: Gastroenterology;  Laterality: N/A;   ESOPHAGOGASTRODUODENOSCOPY (EGD) WITH PROPOFOL N/A 04/05/2022   Procedure: ESOPHAGOGASTRODUODENOSCOPY (EGD) WITH PROPOFOL;  Surgeon: Kathi Der, MD;  Location: [facility];  Service: Gastroenterology;  Laterality: N/A;   POLYPECTOMY  04/05/2022   Procedure: POLYPECTOMY;  Surgeon: Kathi Der, MD;  Location: [facility];  Service: Gastroenterology;;    Current Medications: Current Meds  Medication Sig   albuterol (VENTOLIN HFA) 108 (90 Base) MCG/ACT inhaler Inhale 2 puffs into the lungs every 4 (four) hours as needed for wheezing or shortness of breath.   allopurinol (ZYLOPRIM) 300 MG tablet Take 300 mg by mouth daily.   atorvastatin (LIPITOR) 10 MG tablet Take 1 tablet (10 mg total) by mouth daily.   B Complex-C (SUPER B COMPLEX PO) Take 1 tablet by mouth daily.   cetirizine (ZYRTEC) 10 MG tablet Take 10 mg by mouth daily.   Cholecalciferol (VITAMIN D3) 50 MCG (2000 UT) TABS Take 2,000 Units by mouth daily at 6 (six) AM.   colchicine 0.6 MG tablet Take 1 tablet (0.6 mg total) by mouth 2 (two) times daily. (  Patient taking differently: Take 0.6 mg by mouth as needed (gout).)   dapagliflozin propanediol (FARXIGA) 10 MG TABS tablet Take 1 tablet (10 mg total) by mouth daily before breakfast.   famotidine (PEPCID) 40 MG tablet Take 40 mg by mouth daily.   Fluticasone-Umeclidin-Vilant (TRELEGY ELLIPTA) 100-62.5-25 MCG/ACT AEPB Inhale 1 puff into the lungs daily.   folic acid (FOLVITE) 1 MG tablet Take 1 tablet by mouth once daily   furosemide  (LASIX) 40 MG tablet Take 120 mg by mouth daily.   hydrALAZINE (APRESOLINE) 50 MG tablet Take 1 tablet (50 mg total) by mouth 3 (three) times daily.   lisinopril (ZESTRIL) 5 MG tablet Take 1 tablet by mouth once daily   magnesium oxide (MAG-OX) 400 MG tablet Take 400 mg by mouth daily.   metoprolol succinate (TOPROL-XL) 100 MG 24 hr tablet Take 150 mg by mouth daily.   Multiple Vitamins-Minerals (MULTIVITAMIN WITH MINERALS) tablet Take 1 tablet by mouth daily.   pantoprazole (PROTONIX) 40 MG tablet Take 1 tablet by mouth once daily   rivaroxaban (XARELTO) 20 MG TABS tablet TAKE 1 TABLET BY MOUTH ONCE DAILY WITH SUPPER   sodium chloride (OCEAN) 0.65 % SOLN nasal spray Place 1 spray into both nostrils as needed for congestion.   spironolactone (ALDACTONE) 25 MG tablet Take 1 tablet (25 mg total) by mouth daily.   Tetrahydrozoline-Zn Sulfate (EYE DROPS RELIEF OP) Place 1 drop into both eyes daily as needed (redness relievee).   traZODone (DESYREL) 50 MG tablet Take 50 mg by mouth at bedtime.   Turmeric (QC TUMERIC COMPLEX) 500 MG CAPS Take 500 mg by mouth daily at 6 (six) AM.   vitamin E 180 MG (400 UNITS) capsule Take 400 Units by mouth daily.    Allergies:   Patient has no known allergies.   Social History   Socioeconomic History   Marital status: Married    Spouse name: Bjorn Dunn   Number of children: 2   Years of education: Land education level: Not on file  Occupational History   Not on file  Tobacco Use   Smoking status: Every Day    Current packs/day: 0.50    Average packs/day: 0.5 packs/day for 46.6 years (23.3 ttl pk-yrs)    Types: Cigarettes    Start date: 1978   Smokeless tobacco: Never  Vaping Use   Vaping status: Never Used  Substance and Sexual Activity   Alcohol use: Yes    Alcohol/week: 20.0 standard drinks of alcohol    Types: 20 Cans of beer per week   Drug use: Not Currently   Sexual activity: Yes    Partners: Female    Birth  control/protection: None  Other Topics Concern   Not on file  Social History Narrative   ** Merged History Encounter **       Social Determinants of Health   Financial Resource Strain: Not on file  Food Insecurity: Not on file  Transportation Needs: Not on file  Physical Activity: Not on file  Stress: Not on file  Social Connections: Not on file     Family History:  The patient's family history includes Hypertension in an other family member.   ROS:   Please see the history of present illness.    ROS All other systems reviewed and are negative.      No data to display             PHYSICAL EXAM:   VS:  BP 132/78  Pulse 82   Ht 6\' 2"  (1.88 m)   Wt 257 lb (116.6 kg)   SpO2 96%   BMI 33.00 kg/m    GEN: Well nourished, well developed, in no acute distress  HEENT: normal  Neck: no JVD, carotid bruits, or masses Cardiac: RRR; no murmurs, rubs, or gallops,no edema.  Intact distal pulses bilaterally.  Respiratory:  diffuse wheezing and rhonchi GI: soft, nontender, nondistended, + BS MS: no deformity or atrophy  Skin: warm and dry, no rash Neuro:  Alert and Oriented x 3, Strength and sensation are intact Psych: euthymic mood, full affect  Wt Readings from Last 3 Encounters:  08/16/22 257 lb (116.6 kg)  06/28/22 267 lb (121.1 kg)  05/02/22 260 lb (117.9 kg)      Studies/Labs Reviewed:   HST, PAP compliance download  Recent Labs: 08/18/2021: NT-Pro BNP 1,297; Platelets 512 04/05/2022: Hemoglobin 14.6 06/28/2022: BUN 38; Creatinine, Ser 1.36; Potassium 4.8; Sodium 128   Additional studies/ records that were reviewed today include:  none    ASSESSMENT:    1. OSA (obstructive sleep apnea)   2. Essential hypertension   3. Chronic obstructive pulmonary disease, unspecified COPD type (HCC)      PLAN:  In order of problems listed above:  OSA - The patient is tolerating PAP therapy well without any problems. The PAP download performed by his DME was  personally reviewed and interpreted by me today and showed an AHI of 0.8 /hr on auto CPAP from 4-15 cm H2O with 80% compliance in using more than 4 hours nightly.  The patient has been using and benefiting from PAP use and will continue to benefit from therapy.  -his ONO on CPAP showed persistent nocturnal hypoxemia despite CPAP and therefore I will add on O2 at 2L with CPAP nightly and repeat ONO -I am also going to refer to ENT for evaluation of his allergies and allergic rhinitis  Hypertension -BP controlled on exam today -Continue prescription drug management with hydralazine 50 mg 3 times daily, lisinopril 5 mg daily, Toprol-XL 150 mg daily and spironolactone 25 mg daily with as needed refills  COPD -he had documented O2 sats in the low 90's on his ONO prior to going to sleep -he has chronic cough, ongoing tobacco use and wheezing on exam today -I will refer to Pulmonary for evaluation with [name] or Marchelle Gearing   Time Spent: 30 minutes total time of encounter, including 20 minutes spent in face-to-face patient care on the date of this encounter. This time includes coordination of care and counseling regarding above mentioned problem list. Remainder of non-face-to-face time involved reviewing chart documents/testing relevant to the patient encounter and documentation in the medical record. I have independently reviewed documentation from referring provider  Medication Adjustments/Labs and Tests Ordered: Current medicines are reviewed at length with the patient today.  Concerns regarding medicines are outlined above.  Medication changes, Labs and Tests ordered today are listed in the Patient Instructions below.  There are no Patient Instructions on file for this visit.   Signed, Armanda Magic, MD  08/16/2022 9:53 AM    [facility] [address] [place], [place], [place]  [address] Phone: [phone]; Fax: [phone]

## 2022-08-18 ENCOUNTER — Telehealth: Payer: Self-pay | Admitting: *Deleted

## 2022-08-18 DIAGNOSIS — G4733 Obstructive sleep apnea (adult) (pediatric): Secondary | ICD-10-CM

## 2022-08-18 DIAGNOSIS — R400 Somnolence: Secondary | ICD-10-CM

## 2022-08-18 DIAGNOSIS — I10 Essential (primary) hypertension: Secondary | ICD-10-CM

## 2022-08-18 NOTE — Telephone Encounter (Addendum)
Per Dr Mayford Knife, order placed for 2L O2 while using cpap placed to [facility] via community message.

## 2022-08-18 NOTE — Telephone Encounter (Signed)
-----   Message from Nurse Alcario Drought E sent at 08/16/2022 10:55 AM EDT ----- Regarding: Patient has cpap, now needs nightly O2 while on cpap Dr. Mayford Knife would like to order 2L O2 while using cpap on this patient.  Alcario Drought

## 2022-09-12 ENCOUNTER — Telehealth: Payer: Self-pay | Admitting: Cardiology

## 2022-09-12 NOTE — Telephone Encounter (Signed)
Patient's wife is calling stating they have not heard anything since the order was placed for oxygen.  Please advise.

## 2022-09-26 ENCOUNTER — Institutional Professional Consult (permissible substitution) (INDEPENDENT_AMBULATORY_CARE_PROVIDER_SITE_OTHER): Payer: Medicaid Other | Admitting: Otolaryngology

## 2022-10-03 ENCOUNTER — Ambulatory Visit (INDEPENDENT_AMBULATORY_CARE_PROVIDER_SITE_OTHER): Payer: Medicaid Other | Admitting: Otolaryngology

## 2022-10-03 ENCOUNTER — Encounter (INDEPENDENT_AMBULATORY_CARE_PROVIDER_SITE_OTHER): Payer: Self-pay | Admitting: Otolaryngology

## 2022-10-03 VITALS — BP 129/73 | HR 82 | Ht 74.0 in | Wt 267.8 lb

## 2022-10-03 DIAGNOSIS — R0982 Postnasal drip: Secondary | ICD-10-CM

## 2022-10-03 DIAGNOSIS — J3489 Other specified disorders of nose and nasal sinuses: Secondary | ICD-10-CM

## 2022-10-03 DIAGNOSIS — Z789 Other specified health status: Secondary | ICD-10-CM

## 2022-10-03 DIAGNOSIS — J3089 Other allergic rhinitis: Secondary | ICD-10-CM | POA: Diagnosis not present

## 2022-10-03 DIAGNOSIS — J342 Deviated nasal septum: Secondary | ICD-10-CM | POA: Diagnosis not present

## 2022-10-03 DIAGNOSIS — Z720 Tobacco use: Secondary | ICD-10-CM

## 2022-10-03 DIAGNOSIS — G4733 Obstructive sleep apnea (adult) (pediatric): Secondary | ICD-10-CM

## 2022-10-03 DIAGNOSIS — R0981 Nasal congestion: Secondary | ICD-10-CM | POA: Diagnosis not present

## 2022-10-03 MED ORDER — MOMETASONE FUROATE POWD: 11 refills | Status: DC

## 2022-10-03 MED ORDER — DESLORATADINE 5 MG PO TABS: 5.0000 mg | ORAL_TABLET | Freq: Every day | ORAL | 3 refills | Status: DC

## 2022-10-03 NOTE — Progress Notes (Signed)
ENT CONSULT:  Reason for Consult: nasal obstruction    HPI: Ronnie Dunn is an 62 y.o. male with hx pAfib on Xarelto, COPD, current smoker, cutting down and currently smokes 1 PPD, previously 2.5 PPD, recent diagnosis of OSA, initiated on CPAP therapy 3 months ago by Dr Mayford Knife, and more recently was advised to add O2 therapy, has not started that yet, who is here for sx of nasal congestion nasal obstruction and difficulties with breathing through his nose, unable to tolerate CPAP because of that. On Zyrtec daily. He is not sure the type of nasal spray he is on, but thinks it is nasal saline. He has nasal drainage, denies hx of sinus infections requiring antibiotics. Never had nasal or sinus surgery No recent sinus imaging. He is not sure if sense of smell is impaired. Not sure about hx of facial trauma. He has not been wearing CPAP x 2-3 weeks because it is hard to breath.   Records Reviewed:  Office note by Dr Mayford Knife 08/16/22  [name] is a 62 y.o. male who is being seen today for the evaluation of obstructive sleep at the request of Wynell Balloon, [place].   This is a 62 year old male with a history of Paroxysmal atrial fibrillation, COPD, ongoing tobacco abuse, hypertension, diabetes mellitus, alcohol abuse.  He recently saw Wynell Balloon, PA back in the winter and complained of excessive daytime sleepiness With a STOP-BANG score of 7.   She ordered a home sleep study which demonstrated mild obstructive sleep apnea with an AHI of 9/h.  He was started on auto CPAP from 4 to 15 cm H2O.  He is now referred for sleep medicine consultation to establish sleep care.   he is doing well with his PAP device and thinks that he has gotten used to it.  He tolerates the FF mask and feels the pressure is adequate.  Since going on PAP he feels rested in the am and has no significant daytime sleepiness.  He denies any significant mouth or nasal dryness but has been having some nasal congestion.  He has tried nasal  saline spray and Flonase but still has problems with allergies and allergic rhinitis. He does not think that he snores.    SA - The patient is tolerating PAP therapy well without any problems. The PAP download performed by his DME was personally reviewed and interpreted by me today and showed an AHI of 0.8 /hr on auto CPAP from 4-15 cm H2O with 80% compliance in using more than 4 hours nightly.  The patient has been using and benefiting from PAP use and will continue to benefit from therapy.  -his ONO on CPAP showed persistent nocturnal hypoxemia despite CPAP and therefore I will add on O2 at 2L with CPAP nightly and repeat ONO -I am also going to refer to ENT for evaluation of his allergies and allergic rhinitis   Hypertension -BP controlled on exam today -Continue prescription drug management with hydralazine 50 mg 3 times daily, lisinopril 5 mg daily, Toprol-XL 150 mg daily and spironolactone 25 mg daily with as needed refills   COPD -he had documented O2 sats in the low 90's on his ONO prior to going to sleep -he has chronic cough, ongoing tobacco use and wheezing on exam today -I will refer to Pulmonary for evaluation with [name] or Marchelle Gearing    Past Medical History:  Diagnosis Date   Chronic heart failure with preserved ejection fraction (HCC) 06/01/2021   Chronic  obstructive pulmonary disease (HCC) 05/28/2021   Class 1 obesity 03/29/2020   Essential hypertension 03/29/2020   Gastroesophageal reflux disease 05/28/2021   Hyponatremia 03/29/2020   OSA (obstructive sleep apnea) 06/01/2021   Permanent atrial fibrillation (HCC) 05/28/2021   Thoracic aortic aneurysm without rupture (HCC) 06/01/2021   Type 2 diabetes mellitus without complication, without long-term current use of insulin (HCC) 05/28/2021    Past Surgical History:  Procedure Laterality Date   BIOPSY  04/05/2022   Procedure: BIOPSY;  Surgeon: Kathi Der, MD;  Location: [facility];  Service:  Gastroenterology;;   COLONOSCOPY WITH PROPOFOL N/A 04/05/2022   Procedure: COLONOSCOPY WITH PROPOFOL;  Surgeon: Kathi Der, MD;  Location: [facility];  Service: Gastroenterology;  Laterality: N/A;   ESOPHAGOGASTRODUODENOSCOPY (EGD) WITH PROPOFOL N/A 04/05/2022   Procedure: ESOPHAGOGASTRODUODENOSCOPY (EGD) WITH PROPOFOL;  Surgeon: Kathi Der, MD;  Location: [facility];  Service: Gastroenterology;  Laterality: N/A;   POLYPECTOMY  04/05/2022   Procedure: POLYPECTOMY;  Surgeon: Kathi Der, MD;  Location: [facility];  Service: Gastroenterology;;    Family History  Problem Relation Age of Onset   Hypertension Other     Social History:  reports that he has been smoking cigarettes. He started smoking about 46 years ago. He has a 23.4 pack-year smoking history. He has never used smokeless tobacco. He reports current alcohol use of about 20.0 standard drinks of alcohol per week. He reports that he does not currently use drugs.  Allergies: No Known Allergies  Medications: I have reviewed the patient's current medications.  The PMH, PSH, Medications, Allergies, and SH were reviewed and updated.  ROS: Constitutional: Negative for fever, weight loss and weight gain. Cardiovascular: Negative for chest pain and dyspnea on exertion. Respiratory: Is not experiencing shortness of breath at rest. Gastrointestinal: Negative for nausea and vomiting. Neurological: Negative for headaches. Psychiatric: The patient is not nervous/anxious  Blood pressure 129/73, pulse 82, height 6\' 2"  (1.88 m), weight 267 lb 12.8 oz (121.5 kg), SpO2 98%.  PHYSICAL EXAM:  Exam: General: Well-developed, well-nourished Communication and Voice: raspy Respiratory Respiratory effort: Equal inspiration and expiration without stridor Cardiovascular Peripheral Vascular: Warm extremities with equal color/perfusion Eyes: No nystagmus with equal extraocular motion bilaterally Neuro/Psych/Balance: Patient  oriented to person, place, and time; Appropriate mood and affect; Gait is intact with no imbalance; Cranial nerves I-XII are intact Head and Face Inspection: Normocephalic and atraumatic without mass or lesion Palpation: Facial skeleton intact without bony stepoffs Salivary Glands: No mass or tenderness Facial Strength: Facial motility symmetric and full bilaterally ENT Pinna: External ear intact and fully developed External canal: Canal is patent with intact skin Tympanic Membrane: Clear and mobile External Nose: No scar or anatomic deformity Internal Nose: Septum is severely deviated to the left with caudal septal spur and mid septal deviation, 95% narrowing of the left nasal passage. No polyp, or purulence. Mucosal edema and erythema present with clear secretions b/l nasal passages  Lips, Teeth, and gums: Mucosa intact multiple missing teeth  TMJ: No pain to palpation with full mobility Oral cavity/oropharynx: No erythema or exudate, no lesions present Nasopharynx: No mass or lesion with intact mucosa Neck Neck and Trachea: Midline trachea without mass or lesion Thyroid: No mass or nodularity Lymphatics: No lymphadenopathy Palpable bony prominence at the right clavicular head (reports hx of it for years due to injury)  Procedure:   PROCEDURE NOTE: nasal endoscopy  Preoperative diagnosis: chronic sinusitis symptoms  Postoperative diagnosis: same  Procedure: Diagnostic nasal endoscopy (16109)  Surgeon: Ashok Croon, M.D.  Anesthesia: Topical lidocaine and Afrin  H&P REVIEW: The patient's history and physical were reviewed today prior to procedure. All medications were reviewed and updated as well. Complications: None Condition is stable throughout exam Indications and consent: The patient presents with symptoms of chronic sinusitis not responding to previous therapies. All the risks, benefits, and potential complications were reviewed with the patient preoperatively and  informed consent was obtained. The time out was completed with confirmation of the correct procedure.   Procedure: The patient was seated upright in the clinic. Topical lidocaine and Afrin were applied to the nasal cavity. After adequate anesthesia had occurred, the rigid nasal endoscope was passed into the nasal cavity. The nasal mucosa, turbinates, septum, and sinus drainage pathways were visualized bilaterally. This revealed no purulence or significant secretions that might be cultured. There were no polyps or sites of significant inflammation. The mucosa was intact and there was no crusting present. The scope was then slowly withdrawn and the patient tolerated the procedure well. There were no complications or blood loss.   Studies Reviewed: CT head done 06/21/2021 EXAM: CT HEAD WITHOUT CONTRAST   TECHNIQUE: Contiguous axial images were obtained from the base of the skull through the vertex without intravenous contrast.   RADIATION DOSE REDUCTION: This exam was performed according to the departmental dose-optimization program which includes automated exposure control, adjustment of the mA and/or kV according to patient size and/or use of iterative reconstruction technique.   COMPARISON:  MRI brain 03/29/2020 and CT head 03/28/2020   FINDINGS: Brain: The brainstem, cerebellum, cerebral peduncles, thalami, basal ganglia, basilar cisterns, and ventricular system appear within normal limits. No intracranial hemorrhage, mass lesion, or acute CVA.   Vascular: There is atherosclerotic calcification of the cavernous carotid arteries bilaterally.   Skull: Unremarkable   Sinuses/Orbits: Unremarkable   Other: No supplemental non-categorized findings.   IMPRESSION: 1. No acute intracranial findings. 2. Atherosclerosis.       Assessment/Plan: Encounter Diagnoses  Name Primary?   Nasal septal deviation    Nasal obstruction Yes   Nasal congestion    Intolerance of continuous  positive airway pressure (CPAP) ventilation    Post-nasal drainage    Environmental and seasonal allergies    Tobacco abuse    OSA (obstructive sleep apnea)     62 year old male with history of smoking and COPD, Afib on Xarelto, recent diagnosis of OSA initiated on CPAP 3 months ago and per record review also requires oxygen therapy, here for CPAP intolerance and symptoms of significant nasal obstruction and difficulties with nasal breathing.  Continues to smoke at least 1 pack a day and not interested in smoking cessation program referral at this time.  On daily Zyrtec without significant relief.  Thinks he tried Flonase in the past not using it right now.   On my exam including nasal endoscopy reveals evidence of significant septal deviation on the left side with caudal septal spur and 95% narrowing of nasal passage, there is also severe mucosal edema and erythema, left mid nasal passage was more open, there were no nasal polyps or purulence on exam.  She did not have any sinus cancer recently but did have CT head done in 2023, which shows septal deviation and well aerated paranasal sinuses that were visible on CT head.   I discussed treatment options for his symptoms and advised that smoking will result in more nasal congestion, we also discussed importance of continuing to cut down on smoking.  I discussed symptoms of medical management of nasal  congestion and since he has failed Flonase, and currently irrigates his nasal passages with saline we will add steroid to the rinse.  Will send Rx to compounding pharmacy to be added to his saline rinses for nasal passages and to do daily.  Will switch Zyrtec to Clarinex.  I also briefly discussed surgical options including septoplasty inferior turbinate reduction but at this time he is not interested in surgical interventions.  All questions have been answered.  Nasal congestion and nasal obstruction Steroid rinses for the nose - use once a day Stop  Zyrtec and start Clarinex 5 mg 1 tab daily  2. Tobacco abuse Consider smoking cessation program at [facility] 3. OSA on CPAP See Dr Mayford Knife for CPAP therapy and O2 therapy  RTC 3 mo  Thank you for allowing me to participate in the care of this patient. Please do not hesitate to contact me with any questions or concerns.   Ashok Croon, MD Otolaryngology [facility] Phone: [phone] Fax: [phone]    10/03/2022, 9:59 AM

## 2022-10-03 NOTE — Patient Instructions (Addendum)
Steroid rinses for the nose Stop Zyrtec and start Clarinex Consider smoking cessation program at [facility] See Dr Mayford Knife for CPAP therapy and O2 therapy  Lloyd Hug[employer] Med Nasal Saline Rinse   - start nasal saline rinses with [employer] available over the counter or online to help with nasal congestion

## 2022-10-04 ENCOUNTER — Telehealth: Payer: Self-pay | Admitting: Otolaryngology

## 2022-10-04 NOTE — Telephone Encounter (Signed)
Pt was seen yesterday and was prescribed medication that is not covered by their insurance. Pt would like an alternative medication that will be covered.

## 2022-10-05 ENCOUNTER — Other Ambulatory Visit (INDEPENDENT_AMBULATORY_CARE_PROVIDER_SITE_OTHER): Payer: Self-pay | Admitting: Physician Assistant

## 2022-10-05 MED ORDER — LORATADINE 10 MG PO TABS: 10.0000 mg | ORAL_TABLET | Freq: Every day | ORAL | 6 refills | Status: DC

## 2022-10-05 NOTE — Telephone Encounter (Signed)
Which medication was it?

## 2022-10-06 NOTE — Telephone Encounter (Signed)
Were you able to verify which medication he needed. If not when you call him remember that I sent in loratadine ( Claritin ) if it was the desloratadine that his insurance wouldn't pay for. If it is another medication please let me know so I can adjust. Thanks

## 2022-10-07 NOTE — Telephone Encounter (Signed)
thanks

## 2022-10-12 ENCOUNTER — Telehealth (INDEPENDENT_AMBULATORY_CARE_PROVIDER_SITE_OTHER): Payer: Self-pay | Admitting: Otolaryngology

## 2022-10-12 NOTE — Telephone Encounter (Signed)
Patient's wife, Ronnie Dunn, left vmail mssg stating that the medication Dr.Soldatova sent in to [facility] on [address]. Is not covered by his insurance.  She would like to know if you can call in something else that will be covered.  Please call her at [phone]

## 2022-10-13 NOTE — Telephone Encounter (Signed)
Which medication is his asking about?

## 2022-10-28 ENCOUNTER — Ambulatory Visit: Payer: Medicaid Other | Admitting: Pulmonary Disease

## 2022-10-28 ENCOUNTER — Encounter: Payer: Self-pay | Admitting: Pulmonary Disease

## 2022-10-28 VITALS — BP 134/86 | HR 77 | Ht 74.0 in | Wt 267.8 lb

## 2022-10-28 DIAGNOSIS — J438 Other emphysema: Secondary | ICD-10-CM | POA: Diagnosis not present

## 2022-10-28 DIAGNOSIS — F17210 Nicotine dependence, cigarettes, uncomplicated: Secondary | ICD-10-CM

## 2022-10-28 DIAGNOSIS — G4733 Obstructive sleep apnea (adult) (pediatric): Secondary | ICD-10-CM

## 2022-10-28 NOTE — Patient Instructions (Signed)
Nice to meet you  No changes to medication  Continue Trelegy 1 puff daily rinse your mouth out with water after use  Continue using albuterol as needed  I will send a message to your sleep doctor about the oxygen order Pulmonary clinic in 6 months or sooner if needed with [name]

## 2022-10-28 NOTE — Progress Notes (Signed)
@Patient  ID: Ronnie Dunn, male    DOB: 04-28-1960, 62 y.o.   MRN: [id number]  Chief Complaint  Patient presents with  . Consult    Pt is here for Consult visit. Pt diagnosed with COPD. Pt c/o DOE and Coughing with gray sputum and sometimes blood.    Referring provider: Quintella Reichert, MD  HPI:   62 y.o. man with history of emphysema who were seen for evaluation of shortness of breath.  Most recent cardiology note reviewed.  Most recent sleep doctor that reviewed.  Overall he doing well.  He was hospitalized 06/2021 with pneumonia.  CT at that time revealed mild emphysematous changes, scattered nodule infiltrates in bilateral bases.  He was treated antibiotics and improved.  Has been on Trelegy.  Helps his breathing time.  Also been following with ENT.  Doing sinus rinses and nasal steroids that helped a lot with but sounds like chronic obstruction on the left nostril.  Breathing better.  Has occasional cough.  Occasional hemoptysis in the past but none recently especially on Trelegy.  Cough overall better.  States that really things started after mixing chemicals and mixing the wrong chemicals and what sounds like chemical pneumonitis.  Since then worsening nasal congestion allergies and cough and shortness of breath.  We discussed at length the role and rationale of inhaler therapy and relationship edema versus asthma triggered by chemical exposure.  Discussed role and rationale of continuing intranasal therapies at the discretion of ENT doctor.  He is eager to continue these things given his improvement.  We discussed his sleep apnea and importance of continuing CPAP.  He states he is post have oxygen bled and he is yet to receive this.  Discussed we would talk with his cardiology office about this.  We talked about sleep cessation.  He has cut back to a pack a day for up to 1/2 packs a day.   Questionaires / Pulmonary Flowsheets:   ACT:      No data to display          MMRC:      No data to display          Epworth:      No data to display          Tests:   FENO:  No results found for: "NITRICOXIDE"  PFT:     No data to display          WALK:      No data to display          Imaging: Personally reviewed and as per EMR and discussion in this note No results found.  Lab Results: Personally reviewed and as per EMR discussion this note CBC    Component Value Date/Time   WBC 6.8 08/18/2021 0855   WBC 7.3 06/25/2021 0537   RBC 4.69 08/18/2021 0855   RBC 3.75 (L) 06/25/2021 0537   HGB 14.6 04/05/2022 0652   HGB 15.4 08/18/2021 0855   HCT 43.0 04/05/2022 0652   HCT 45.8 08/18/2021 0855   PLT 512 (H) 08/18/2021 0855   MCV 98 (H) 08/18/2021 0855   MCH 32.8 08/18/2021 0855   MCH 33.1 06/25/2021 0537   MCHC 33.6 08/18/2021 0855   MCHC 33.0 06/25/2021 0537   RDW 13.8 08/18/2021 0855   LYMPHSABS 1.2 07/07/2021 0920   MONOABS 0.6 06/25/2021 0537   EOSABS 0.1 07/07/2021 0920   BASOSABS 0.1 07/07/2021 0920    BMET  Component Value Date/Time   NA 128 (L) 06/28/2022 0917   K 4.8 06/28/2022 0917   CL 90 (L) 06/28/2022 0917   CO2 23 06/28/2022 0917   GLUCOSE 137 (H) 06/28/2022 0917   GLUCOSE 123 (H) 04/05/2022 0652   BUN 38 (H) 06/28/2022 0917   CREATININE 1.36 (H) 06/28/2022 0917   CALCIUM 9.2 06/28/2022 0917   GFRNONAA >60 06/25/2021 0537   GFRAA  12/17/2006 0500    >60        The eGFR has been calculated using the MDRD equation. This calculation has not been validated in all clinical    BNP    Component Value Date/Time   BNP 77.0 06/21/2021 1815    ProBNP    Component Value Date/Time   PROBNP 1,297 (H) 08/18/2021 0855    Specialty Problems       Pulmonary Problems   Chronic obstructive pulmonary disease (HCC)   OSA (obstructive sleep apnea)    No Known Allergies   There is no immunization history on file for this patient.  Past Medical History:  Diagnosis Date  . Chronic heart failure with  preserved ejection fraction (HCC) 06/01/2021  . Chronic obstructive pulmonary disease (HCC) 05/28/2021  . Class 1 obesity 03/29/2020  . Essential hypertension 03/29/2020  . Gastroesophageal reflux disease 05/28/2021  . Hyponatremia 03/29/2020  . OSA (obstructive sleep apnea) 06/01/2021  . Permanent atrial fibrillation (HCC) 05/28/2021  . Thoracic aortic aneurysm without rupture (HCC) 06/01/2021  . Type 2 diabetes mellitus without complication, without long-term current use of insulin (HCC) 05/28/2021    Tobacco History: Social History   Tobacco Use  Smoking Status Every Day  . Current packs/day: 0.50  . Average packs/day: 0.5 packs/day for 46.8 years (23.4 ttl pk-yrs)  . Types: Cigarettes  . Start date: 1978  Smokeless Tobacco Never   Ready to quit: Not Answered Counseling given: Not Answered   Continue to not smoke  Outpatient Encounter Medications as of 10/28/2022  Medication Sig  . albuterol (VENTOLIN HFA) 108 (90 Base) MCG/ACT inhaler Inhale 2 puffs into the lungs every 4 (four) hours as needed for wheezing or shortness of breath.  . allopurinol (ZYLOPRIM) 300 MG tablet Take 300 mg by mouth daily.  Marland Kitchen atorvastatin (LIPITOR) 10 MG tablet Take 1 tablet (10 mg total) by mouth daily.  . B Complex-C (SUPER B COMPLEX PO) Take 1 tablet by mouth daily.  . Cholecalciferol (VITAMIN D3) 50 MCG (2000 UT) TABS Take 2,000 Units by mouth daily at 6 (six) AM.  . colchicine 0.6 MG tablet Take 1 tablet (0.6 mg total) by mouth 2 (two) times daily. (Patient taking differently: Take 0.6 mg by mouth as needed (gout).)  . dapagliflozin propanediol (FARXIGA) 10 MG TABS tablet Take 1 tablet (10 mg total) by mouth daily before breakfast.  . desloratadine (CLARINEX) 5 MG tablet Take 1 tablet (5 mg total) by mouth daily.  . famotidine (PEPCID) 40 MG tablet Take 40 mg by mouth daily.  . Fluticasone-Umeclidin-Vilant (TRELEGY ELLIPTA) 100-62.5-25 MCG/ACT AEPB Inhale 1 puff into the lungs daily.  . folic  acid (FOLVITE) 1 MG tablet Take 1 tablet by mouth once daily  . furosemide (LASIX) 40 MG tablet Take 120 mg by mouth daily.  . hydrALAZINE (APRESOLINE) 50 MG tablet Take 1 tablet (50 mg total) by mouth 3 (three) times daily.  Marland Kitchen lisinopril (ZESTRIL) 5 MG tablet Take 1 tablet by mouth once daily  . loratadine (CLARITIN) 10 MG tablet Take 1 tablet (10 mg total)  by mouth daily.  . magnesium oxide (MAG-OX) 400 MG tablet Take 400 mg by mouth daily.  . metoprolol succinate (TOPROL-XL) 100 MG 24 hr tablet Take 150 mg by mouth daily.  . Mometasone Furoate POWD Use .6mg /dose in sinus rinse bottle with 8oz of saline. Irrigate each nostril twice daily  . Multiple Vitamins-Minerals (MULTIVITAMIN WITH MINERALS) tablet Take 1 tablet by mouth daily.  . pantoprazole (PROTONIX) 40 MG tablet Take 1 tablet by mouth once daily  . rivaroxaban (XARELTO) 20 MG TABS tablet TAKE 1 TABLET BY MOUTH ONCE DAILY WITH SUPPER  . sodium chloride (OCEAN) 0.65 % SOLN nasal spray Place 1 spray into both nostrils as needed for congestion.  Marland Kitchen spironolactone (ALDACTONE) 25 MG tablet Take 1 tablet (25 mg total) by mouth daily.  Jeananne Rama Sulfate (EYE DROPS RELIEF OP) Place 1 drop into both eyes daily as needed (redness relievee).  . traZODone (DESYREL) 50 MG tablet Take 50 mg by mouth at bedtime.  . Turmeric (QC TUMERIC COMPLEX) 500 MG CAPS Take 500 mg by mouth daily at 6 (six) AM.  . vitamin E 180 MG (400 UNITS) capsule Take 400 Units by mouth daily.   No facility-administered encounter medications on file as of 10/28/2022.     Review of Systems  Review of Systems  No chest pain with exertion no orthopnea or PND.  Comprehensive review of systems otherwise negative. Physical Exam  BP 134/86 (BP Location: Left Arm, Cuff Size: Normal)   Pulse 77   Ht 6\' 2"  (1.88 m)   Wt 267 lb 12.8 oz (121.5 kg)   SpO2 97%   BMI 34.38 kg/m   Wt Readings from Last 5 Encounters:  10/28/22 267 lb 12.8 oz (121.5 kg)  10/03/22 267  lb 12.8 oz (121.5 kg)  08/16/22 257 lb (116.6 kg)  06/28/22 267 lb (121.1 kg)  05/02/22 260 lb (117.9 kg)    BMI Readings from Last 5 Encounters:  10/28/22 34.38 kg/m  10/03/22 34.38 kg/m  08/16/22 33.00 kg/m  06/28/22 34.28 kg/m  05/02/22 33.38 kg/m     Physical Exam General: Sitting in chair, no acute distress Eyes: EOMI, no icterus Neck: Supple, no JVP Pulmonary: Clear, normal work of breathing Abdomen: Asking about his present MSK: No synovitis, no joint effusion Neuro: Normal gait, no weakness Psych: Normal mood, full affect   Assessment & Plan:   Emphysema: On CT scan in 2023.  Likely driver of chronic symptoms.  Improved with Trelegy.  To continue.  Use albuterol as needed.  Dyspnea on exertion: Improved overall over time.  Emphysema better with Trelegy.  Likely cardiac contribution given dilated left atrium, permanent A-fib.  Consider PFTs in the future if needed.  OSA on CPAP: Managed by cardiology office.  Message sent to their office today regarding oxygen bleed and that he is yet to receive.  Smoking assessment and cessation counseling Patient currently smoking: I have advised the patient to quit/stop smoking as soon as possible due to high risk for multiple medical problems.  It will also be very difficult for [place] to manage patient's  respiratory symptoms and status if we continue to expose her lungs to a known irritant. Patient is  willing to quit smoking. I have advised the patient that we can assist and have options of nicotine replacement therapy, provided smoking cessation education today, provided smoking cessation counseling, and provided cessation resources. Follow-up next office visit office visit for assessment of smoking cessation.  I spent 3 minutes in smoking cessation  counseling.    Return in about 6 months (around 04/28/2023) for f/u [name].   Karren Burly, MD 10/28/2022   This appointment required 64 minutes of patient care  (this includes precharting, chart review, review of results, face-to-face care, etc.).

## 2022-11-22 ENCOUNTER — Other Ambulatory Visit (HOSPITAL_COMMUNITY): Payer: Self-pay

## 2023-01-02 ENCOUNTER — Ambulatory Visit (INDEPENDENT_AMBULATORY_CARE_PROVIDER_SITE_OTHER): Payer: Medicaid Other | Admitting: Otolaryngology

## 2023-01-02 ENCOUNTER — Encounter (INDEPENDENT_AMBULATORY_CARE_PROVIDER_SITE_OTHER): Payer: Self-pay | Admitting: Otolaryngology

## 2023-01-02 VITALS — BP 125/75 | HR 76

## 2023-01-02 DIAGNOSIS — J3089 Other allergic rhinitis: Secondary | ICD-10-CM

## 2023-01-02 DIAGNOSIS — Z789 Other specified health status: Secondary | ICD-10-CM

## 2023-01-02 DIAGNOSIS — R0981 Nasal congestion: Secondary | ICD-10-CM

## 2023-01-02 DIAGNOSIS — G4733 Obstructive sleep apnea (adult) (pediatric): Secondary | ICD-10-CM

## 2023-01-02 DIAGNOSIS — R0982 Postnasal drip: Secondary | ICD-10-CM

## 2023-01-02 DIAGNOSIS — Z720 Tobacco use: Secondary | ICD-10-CM

## 2023-01-02 DIAGNOSIS — Z91198 Patient's noncompliance with other medical treatment and regimen for other reason: Secondary | ICD-10-CM

## 2023-01-02 DIAGNOSIS — F17210 Nicotine dependence, cigarettes, uncomplicated: Secondary | ICD-10-CM | POA: Diagnosis not present

## 2023-01-02 DIAGNOSIS — J342 Deviated nasal septum: Secondary | ICD-10-CM

## 2023-01-02 MED ORDER — DESLORATADINE 5 MG PO TABS: 5.0000 mg | ORAL_TABLET | Freq: Every day | ORAL | 3 refills | Status: DC

## 2023-01-02 NOTE — Progress Notes (Signed)
ENT CONSULT:  Update 01/02/23 Discussed the use of AI scribe software for clinical note transcription with the patient, who gave verbal consent to proceed.  History of Present Illness   The patient is a 62 yoM, with a history of chronic nasal congestion, who returns for f/u. He has been managing his symptoms with a nasal wash twice daily, which has led to significant improvement. However, he has not been taking Clarinex, an allergy medication, due to a miscommunication with the pharmacy. The patient also uses a CPAP machine, but reports difficulty tolerating it due to discomfort with the mask and difficulty breathing.  The patient is a current smoker, having reduced his intake from two and a half packs to one pack per day. Despite the reduction, the smoking habit continues to contribute to his nasal obstruction.  The patient's nasal obstruction is also exacerbated by a deviated septum, which has been identified during his last visit on nasal endoscopy. The patient has multiple comorbidities, including lung and heart disease, and is on a blood thinner.  At present, the patient feels that the nasal rinses are providing sufficient relief, and has not expressed a desire for surgical intervention.       Initial Consult 10/03/22 Reason for Consult: chronic nasal obstruction    HPI: Ronnie Dunn is an 62 y.o. male with hx pAfib on Xarelto, COPD, current smoker, cutting down and currently smokes 1 PPD, previously 2.5 PPD, recent diagnosis of OSA, initiated on CPAP therapy 3 months ago by Dr Mayford Knife, and more recently was advised to add O2 therapy, has not started that yet, who is here for sx of nasal congestion nasal obstruction and difficulties with breathing through his nose, unable to tolerate CPAP because of that. On Zyrtec daily. He is not sure the type of nasal spray he is on, but thinks it is nasal saline. He has nasal drainage, denies hx of sinus infections requiring antibiotics. Never had nasal  or sinus surgery No recent sinus imaging. He is not sure if sense of smell is impaired. Not sure about hx of facial trauma. He has not been wearing CPAP x 2-3 weeks because it is hard to breath.   Records Reviewed:  Office note by Dr Mayford Knife 08/16/22  Ronnie Dunn is a 62 y.o. male who is being seen today for the evaluation of obstructive sleep at the request of Wynell Balloon, [place].   This is a 62 year old male with a history of Paroxysmal atrial fibrillation, COPD, ongoing tobacco abuse, hypertension, diabetes mellitus, alcohol abuse.  He recently saw Wynell Balloon, PA back in the winter and complained of excessive daytime sleepiness With a STOP-BANG score of 7.   She ordered a home sleep study which demonstrated mild obstructive sleep apnea with an AHI of 9/h.  He was started on auto CPAP from 4 to 15 cm H2O.  He is now referred for sleep medicine consultation to establish sleep care.   he is doing well with his PAP device and thinks that he has gotten used to it.  He tolerates the FF mask and feels the pressure is adequate.  Since going on PAP he feels rested in the am and has no significant daytime sleepiness.  He denies any significant mouth or nasal dryness but has been having some nasal congestion.  He has tried nasal saline spray and Flonase but still has problems with allergies and allergic rhinitis. He does not think that he snores.    SA - The patient is  tolerating PAP therapy well without any problems. The PAP download performed by his DME was personally reviewed and interpreted by me today and showed an AHI of 0.8 /hr on auto CPAP from 4-15 cm H2O with 80% compliance in using more than 4 hours nightly.  The patient has been using and benefiting from PAP use and will continue to benefit from therapy.  -his ONO on CPAP showed persistent nocturnal hypoxemia despite CPAP and therefore I will add on O2 at 2L with CPAP nightly and repeat ONO -I am also going to refer to ENT for evaluation of his  allergies and allergic rhinitis   Hypertension -BP controlled on exam today -Continue prescription drug management with hydralazine 50 mg 3 times daily, lisinopril 5 mg daily, Toprol-XL 150 mg daily and spironolactone 25 mg daily with as needed refills   COPD -he had documented O2 sats in the low 90's on his ONO prior to going to sleep -he has chronic cough, ongoing tobacco use and wheezing on exam today -I will refer to Pulmonary for evaluation with [name] or Marchelle Gearing    Past Medical History:  Diagnosis Date   Chronic heart failure with preserved ejection fraction (HCC) 06/01/2021   Chronic obstructive pulmonary disease (HCC) 05/28/2021   Class 1 obesity 03/29/2020   Essential hypertension 03/29/2020   Gastroesophageal reflux disease 05/28/2021   Hyponatremia 03/29/2020   OSA (obstructive sleep apnea) 06/01/2021   Permanent atrial fibrillation (HCC) 05/28/2021   Thoracic aortic aneurysm without rupture (HCC) 06/01/2021   Type 2 diabetes mellitus without complication, without long-term current use of insulin (HCC) 05/28/2021    Past Surgical History:  Procedure Laterality Date   BIOPSY  04/05/2022   Procedure: BIOPSY;  Surgeon: Kathi Der, MD;  Location: [facility];  Service: Gastroenterology;;   COLONOSCOPY WITH PROPOFOL N/A 04/05/2022   Procedure: COLONOSCOPY WITH PROPOFOL;  Surgeon: Kathi Der, MD;  Location: [facility];  Service: Gastroenterology;  Laterality: N/A;   ESOPHAGOGASTRODUODENOSCOPY (EGD) WITH PROPOFOL N/A 04/05/2022   Procedure: ESOPHAGOGASTRODUODENOSCOPY (EGD) WITH PROPOFOL;  Surgeon: Kathi Der, MD;  Location: [facility];  Service: Gastroenterology;  Laterality: N/A;   POLYPECTOMY  04/05/2022   Procedure: POLYPECTOMY;  Surgeon: Kathi Der, MD;  Location: [facility];  Service: Gastroenterology;;    Family History  Problem Relation Age of Onset   Hypertension Other     Social History:  reports that he has been smoking  cigarettes. He started smoking about 46 years ago. He has a 23.5 pack-year smoking history. He has never used smokeless tobacco. He reports current alcohol use of about 20.0 standard drinks of alcohol per week. He reports that he does not currently use drugs.  Allergies: Not on File  Medications: I have reviewed the patient's current medications.  The PMH, PSH, Medications, Allergies, and SH were reviewed and updated.  ROS: Constitutional: Negative for fever, weight loss and weight gain. Cardiovascular: Negative for chest pain and dyspnea on exertion. Respiratory: Is not experiencing shortness of breath at rest. Gastrointestinal: Negative for nausea and vomiting. Neurological: Negative for headaches. Psychiatric: The patient is not nervous/anxious  Blood pressure 125/75, pulse 76, SpO2 97%.  PHYSICAL EXAM:  Exam: General: Well-developed, well-nourished Communication and Voice: raspy Respiratory Respiratory effort: Equal inspiration and expiration without stridor Cardiovascular Peripheral Vascular: Warm extremities with equal color/perfusion Eyes: No nystagmus with equal extraocular motion bilaterally Neuro/Psych/Balance: Patient oriented to person, place, and time; Appropriate mood and affect; Gait is intact with no imbalance; Cranial nerves I-XII are intact Head and Face  Inspection: Normocephalic and atraumatic without mass or lesion Palpation: Facial skeleton intact without bony stepoffs Salivary Glands: No mass or tenderness Facial Strength: Facial motility symmetric and full bilaterally ENT Pinna: External ear intact and fully developed External canal: Canal is patent with intact skin Tympanic Membrane: Clear and mobile External Nose: No scar or anatomic deformity Lips, Teeth, and gums: Mucosa intact multiple missing teeth  TMJ: No pain to palpation with full mobility Oral cavity/oropharynx: No erythema or exudate, no lesions present Neck Neck and Trachea: Midline  trachea without mass or lesion  Procedure: none   Studies Reviewed: CT head done 06/21/2021 EXAM: CT HEAD WITHOUT CONTRAST   TECHNIQUE: Contiguous axial images were obtained from the base of the skull through the vertex without intravenous contrast.   RADIATION DOSE REDUCTION: This exam was performed according to the departmental dose-optimization program which includes automated exposure control, adjustment of the mA and/or kV according to patient size and/or use of iterative reconstruction technique.   COMPARISON:  MRI brain 03/29/2020 and CT head 03/28/2020   FINDINGS: Brain: The brainstem, cerebellum, cerebral peduncles, thalami, basal ganglia, basilar cisterns, and ventricular system appear within normal limits. No intracranial hemorrhage, mass lesion, or acute CVA.   Vascular: There is atherosclerotic calcification of the cavernous carotid arteries bilaterally.   Skull: Unremarkable   Sinuses/Orbits: Unremarkable   Other: No supplemental non-categorized findings.   IMPRESSION: 1. No acute intracranial findings. 2. Atherosclerosis.       Assessment/Plan: Encounter Diagnoses  Name Primary?   Nasal septal deviation    Tobacco abuse    OSA (obstructive sleep apnea)    Post-nasal drainage    Intolerance of continuous positive airway pressure (CPAP) ventilation    Environmental and seasonal allergies    Chronic nasal congestion Yes     62 year old male with history of smoking and COPD, Afib on Xarelto, recent diagnosis of OSA initiated on CPAP 3 months ago and per record review also requires oxygen therapy, here for CPAP intolerance and symptoms of significant nasal obstruction and difficulties with nasal breathing.  Continues to smoke at least 1 pack a day and not interested in smoking cessation program referral at this time.  On daily Zyrtec without significant relief.  Thinks he tried Flonase in the past not using it right now.   On my exam including nasal  endoscopy reveals evidence of significant septal deviation on the left side with caudal septal spur and 95% narrowing of nasal passage, there is also severe mucosal edema and erythema, left mid nasal passage was more open, there were no nasal polyps or purulence on exam.  She did not have any sinus cancer recently but did have CT head done in 2023, which shows septal deviation and well aerated paranasal sinuses that were visible on CT head.   Exam with nasal endoscopy Internal Nose: Septum is severely deviated to the left with caudal septal spur and mid septal deviation, 95% narrowing of the left nasal passage. No polyp, or purulence. Mucosal edema and erythema present with clear secretions b/l nasal passages   I discussed treatment options for his symptoms and advised that smoking will result in more nasal congestion, we also discussed importance of continuing to cut down on smoking.  I discussed symptoms of medical management of nasal congestion and since he has failed Flonase, and currently irrigates his nasal passages with saline we will add steroid to the rinse.  Will send Rx to compounding pharmacy to be added to his saline rinses for nasal  passages and to do daily.  Will switch Zyrtec to Clarinex.  I also briefly discussed surgical options including septoplasty inferior turbinate reduction but at this time he is not interested in surgical interventions.  All questions have been answered.  Nasal congestion and nasal obstruction Steroid rinses for the nose - use once a day Stop Zyrtec and start Clarinex 5 mg 1 tab daily  2. Tobacco abuse Consider smoking cessation program at [facility] 3. OSA on CPAP See Dr Mayford Knife for CPAP therapy and O2 therapy  RTC 3 mo  Update 01/02/23 Assessment and Plan    Chronic Nasal Congestion Chronic nasal congestion likely multifactorial due to allergic rhinitis and anatomical obstruction (deviated septum and enlarged turbinates). Symptoms improved with nasal rinses  and steroid use. Smoking exacerbates symptoms. Discussed multifactorial nature of nasal obstruction, including allergies and anatomical issues. Surgery (septoplasty) is an option if medications and nasal rinses are insufficient. Risks of septoplasty include higher risk of epistaxis due to blood thinner use and comorbidities (lung and heart disease).   - Resend Clarinex 5 mg daily prescription - Continue nasal rinses with steroid twice daily  - Apply Vaseline to nostrils after irrigation in the morning and at night - Follow up in 6-12 months for reassessment of sx and refills  CPAP Intolerance Difficulty tolerating CPAP due to nasal obstruction and mask discomfort. Smoking exacerbates nasal obstruction. Current nasal congestion management is helping. - Continue current nasal congestion management - Encourage smoking cessation - spent 3 min counseling the patient about importance of smoking   Smoking Cessation Reduced smoking from two and a half packs to one pack per day. Smoking contributes to nasal congestion and CPAP intolerance. Smoking cessation program previously offered and declined. - Encourage further reduction in smoking - Offered smoking cessation program, spent 3 min discussing importance of quitting smoking - he would like to hold off at this time   Environmental allergies  Discussed importance of switching allergy medications periodically to prevent resistance. Current regimen is effective. - Switch from cetirizine to Clarinex - will consider allergy testing in the future   Follow-up - Follow up in 6-12 months.    I spent 30 minutes in total face-to-face time and in reviewing records during pre-charting, more than 50% of which was spent in counseling and coordination of care, reviewing test results, reviewing medications and treatment regimen and/or in discussing or reviewing the diagnosis, the prognosis and treatment options. Pertinent laboratory and imaging test results that  were available during this visit with the patient were reviewed by me and considered in my medical decision making (see chart for details).      Ashok Croon, MD Otolaryngology [facility] Phone: [phone] Fax: [phone]    01/02/2023, 2:00 PM

## 2023-02-23 ENCOUNTER — Other Ambulatory Visit: Payer: Self-pay | Admitting: Internal Medicine

## 2023-02-23 NOTE — Telephone Encounter (Addendum)
 Prescription refill request for Xarelto  received.  Indication: afib  Last office visit:[place], 08/16/2022 Weight: 121.5 kg  Age: 63 yo  Scr: 1.36, 06/28/2022 CrCl: 96 ml/min   Refill sent.

## 2023-03-18 DIAGNOSIS — G4733 Obstructive sleep apnea (adult) (pediatric): Secondary | ICD-10-CM | POA: Diagnosis not present

## 2023-03-22 ENCOUNTER — Other Ambulatory Visit: Payer: Self-pay | Admitting: Pulmonary Disease

## 2023-03-22 MED ORDER — AZITHROMYCIN 250 MG PO TABS: ORAL_TABLET | ORAL | 0 refills | Status: AC

## 2023-03-22 NOTE — Progress Notes (Signed)
 Patient wife requesting antibiotic for patient given ongoing cough congestion etc.  Z-Pak sent into pharmacy.

## 2023-03-29 ENCOUNTER — Other Ambulatory Visit: Payer: Self-pay | Admitting: Pulmonary Disease

## 2023-03-31 ENCOUNTER — Other Ambulatory Visit: Payer: Self-pay | Admitting: Pulmonary Disease

## 2023-04-03 ENCOUNTER — Emergency Department (HOSPITAL_COMMUNITY)

## 2023-04-03 ENCOUNTER — Other Ambulatory Visit: Payer: Self-pay

## 2023-04-03 ENCOUNTER — Encounter (HOSPITAL_COMMUNITY): Payer: Self-pay | Admitting: Internal Medicine

## 2023-04-03 ENCOUNTER — Inpatient Hospital Stay (HOSPITAL_COMMUNITY)
Admission: EM | Admit: 2023-04-03 | Discharge: 2023-05-18 | DRG: 870 | Disposition: E | Attending: Internal Medicine | Admitting: Internal Medicine

## 2023-04-03 DIAGNOSIS — E871 Hypo-osmolality and hyponatremia: Secondary | ICD-10-CM | POA: Diagnosis present

## 2023-04-03 DIAGNOSIS — J939 Pneumothorax, unspecified: Secondary | ICD-10-CM | POA: Diagnosis not present

## 2023-04-03 DIAGNOSIS — A4102 Sepsis due to Methicillin resistant Staphylococcus aureus: Principal | ICD-10-CM | POA: Diagnosis present

## 2023-04-03 DIAGNOSIS — J15212 Pneumonia due to Methicillin resistant Staphylococcus aureus: Secondary | ICD-10-CM | POA: Diagnosis present

## 2023-04-03 DIAGNOSIS — A419 Sepsis, unspecified organism: Secondary | ICD-10-CM | POA: Diagnosis not present

## 2023-04-03 DIAGNOSIS — R0902 Hypoxemia: Secondary | ICD-10-CM | POA: Diagnosis not present

## 2023-04-03 DIAGNOSIS — Z515 Encounter for palliative care: Secondary | ICD-10-CM | POA: Diagnosis not present

## 2023-04-03 DIAGNOSIS — F10239 Alcohol dependence with withdrawal, unspecified: Secondary | ICD-10-CM | POA: Diagnosis not present

## 2023-04-03 DIAGNOSIS — G936 Cerebral edema: Secondary | ICD-10-CM | POA: Diagnosis not present

## 2023-04-03 DIAGNOSIS — M47816 Spondylosis without myelopathy or radiculopathy, lumbar region: Secondary | ICD-10-CM | POA: Diagnosis present

## 2023-04-03 DIAGNOSIS — J9819 Other pulmonary collapse: Secondary | ICD-10-CM | POA: Diagnosis present

## 2023-04-03 DIAGNOSIS — J90 Pleural effusion, not elsewhere classified: Secondary | ICD-10-CM | POA: Diagnosis not present

## 2023-04-03 DIAGNOSIS — E43 Unspecified severe protein-calorie malnutrition: Secondary | ICD-10-CM | POA: Diagnosis present

## 2023-04-03 DIAGNOSIS — I619 Nontraumatic intracerebral hemorrhage, unspecified: Secondary | ICD-10-CM | POA: Diagnosis not present

## 2023-04-03 DIAGNOSIS — I130 Hypertensive heart and chronic kidney disease with heart failure and stage 1 through stage 4 chronic kidney disease, or unspecified chronic kidney disease: Secondary | ICD-10-CM | POA: Diagnosis present

## 2023-04-03 DIAGNOSIS — F1010 Alcohol abuse, uncomplicated: Secondary | ICD-10-CM | POA: Diagnosis not present

## 2023-04-03 DIAGNOSIS — D539 Nutritional anemia, unspecified: Secondary | ICD-10-CM | POA: Diagnosis present

## 2023-04-03 DIAGNOSIS — R040 Epistaxis: Secondary | ICD-10-CM | POA: Diagnosis not present

## 2023-04-03 DIAGNOSIS — J449 Chronic obstructive pulmonary disease, unspecified: Secondary | ICD-10-CM | POA: Diagnosis not present

## 2023-04-03 DIAGNOSIS — J189 Pneumonia, unspecified organism: Secondary | ICD-10-CM

## 2023-04-03 DIAGNOSIS — E869 Volume depletion, unspecified: Secondary | ICD-10-CM | POA: Diagnosis not present

## 2023-04-03 DIAGNOSIS — T502X5A Adverse effect of carbonic-anhydrase inhibitors, benzothiadiazides and other diuretics, initial encounter: Secondary | ICD-10-CM | POA: Diagnosis present

## 2023-04-03 DIAGNOSIS — I509 Heart failure, unspecified: Secondary | ICD-10-CM | POA: Diagnosis not present

## 2023-04-03 DIAGNOSIS — D4989 Neoplasm of unspecified behavior of other specified sites: Secondary | ICD-10-CM | POA: Diagnosis not present

## 2023-04-03 DIAGNOSIS — N179 Acute kidney failure, unspecified: Secondary | ICD-10-CM | POA: Diagnosis present

## 2023-04-03 DIAGNOSIS — J9601 Acute respiratory failure with hypoxia: Secondary | ICD-10-CM | POA: Diagnosis present

## 2023-04-03 DIAGNOSIS — I4821 Permanent atrial fibrillation: Secondary | ICD-10-CM | POA: Diagnosis present

## 2023-04-03 DIAGNOSIS — K709 Alcoholic liver disease, unspecified: Secondary | ICD-10-CM | POA: Diagnosis present

## 2023-04-03 DIAGNOSIS — E872 Acidosis, unspecified: Secondary | ICD-10-CM | POA: Diagnosis present

## 2023-04-03 DIAGNOSIS — I5032 Chronic diastolic (congestive) heart failure: Secondary | ICD-10-CM | POA: Diagnosis present

## 2023-04-03 DIAGNOSIS — J1520 Pneumonia due to staphylococcus, unspecified: Secondary | ICD-10-CM | POA: Diagnosis present

## 2023-04-03 DIAGNOSIS — N1831 Chronic kidney disease, stage 3a: Secondary | ICD-10-CM | POA: Diagnosis present

## 2023-04-03 DIAGNOSIS — K219 Gastro-esophageal reflux disease without esophagitis: Secondary | ICD-10-CM | POA: Diagnosis present

## 2023-04-03 DIAGNOSIS — E870 Hyperosmolality and hypernatremia: Secondary | ICD-10-CM | POA: Diagnosis not present

## 2023-04-03 DIAGNOSIS — C9000 Multiple myeloma not having achieved remission: Secondary | ICD-10-CM | POA: Diagnosis present

## 2023-04-03 DIAGNOSIS — L89106 Pressure-induced deep tissue damage of unspecified part of back: Secondary | ICD-10-CM | POA: Diagnosis not present

## 2023-04-03 DIAGNOSIS — G9340 Encephalopathy, unspecified: Secondary | ICD-10-CM | POA: Diagnosis present

## 2023-04-03 DIAGNOSIS — R6520 Severe sepsis without septic shock: Secondary | ICD-10-CM | POA: Diagnosis present

## 2023-04-03 DIAGNOSIS — L89326 Pressure-induced deep tissue damage of left buttock: Secondary | ICD-10-CM | POA: Diagnosis not present

## 2023-04-03 DIAGNOSIS — G9341 Metabolic encephalopathy: Secondary | ICD-10-CM | POA: Diagnosis present

## 2023-04-03 DIAGNOSIS — Z79899 Other long term (current) drug therapy: Secondary | ICD-10-CM

## 2023-04-03 DIAGNOSIS — I63331 Cerebral infarction due to thrombosis of right posterior cerebral artery: Secondary | ICD-10-CM | POA: Diagnosis not present

## 2023-04-03 DIAGNOSIS — I63521 Cerebral infarction due to unspecified occlusion or stenosis of right anterior cerebral artery: Secondary | ICD-10-CM | POA: Diagnosis not present

## 2023-04-03 DIAGNOSIS — R918 Other nonspecific abnormal finding of lung field: Secondary | ICD-10-CM | POA: Diagnosis not present

## 2023-04-03 DIAGNOSIS — J439 Emphysema, unspecified: Secondary | ICD-10-CM | POA: Diagnosis present

## 2023-04-03 DIAGNOSIS — E876 Hypokalemia: Secondary | ICD-10-CM | POA: Diagnosis present

## 2023-04-03 DIAGNOSIS — I712 Thoracic aortic aneurysm, without rupture, unspecified: Secondary | ICD-10-CM | POA: Diagnosis present

## 2023-04-03 DIAGNOSIS — R509 Fever, unspecified: Secondary | ICD-10-CM | POA: Diagnosis not present

## 2023-04-03 DIAGNOSIS — R0603 Acute respiratory distress: Secondary | ICD-10-CM | POA: Diagnosis not present

## 2023-04-03 DIAGNOSIS — E8809 Other disorders of plasma-protein metabolism, not elsewhere classified: Secondary | ICD-10-CM | POA: Diagnosis present

## 2023-04-03 DIAGNOSIS — M48061 Spinal stenosis, lumbar region without neurogenic claudication: Secondary | ICD-10-CM | POA: Diagnosis present

## 2023-04-03 DIAGNOSIS — I517 Cardiomegaly: Secondary | ICD-10-CM | POA: Diagnosis not present

## 2023-04-03 DIAGNOSIS — G4733 Obstructive sleep apnea (adult) (pediatric): Secondary | ICD-10-CM | POA: Diagnosis present

## 2023-04-03 DIAGNOSIS — I7143 Infrarenal abdominal aortic aneurysm, without rupture: Secondary | ICD-10-CM | POA: Diagnosis present

## 2023-04-03 DIAGNOSIS — I3139 Other pericardial effusion (noninflammatory): Secondary | ICD-10-CM | POA: Diagnosis not present

## 2023-04-03 DIAGNOSIS — Z1152 Encounter for screening for COVID-19: Secondary | ICD-10-CM

## 2023-04-03 DIAGNOSIS — R531 Weakness: Secondary | ICD-10-CM | POA: Diagnosis not present

## 2023-04-03 DIAGNOSIS — I672 Cerebral atherosclerosis: Secondary | ICD-10-CM | POA: Diagnosis not present

## 2023-04-03 DIAGNOSIS — R109 Unspecified abdominal pain: Secondary | ICD-10-CM | POA: Diagnosis not present

## 2023-04-03 DIAGNOSIS — F17210 Nicotine dependence, cigarettes, uncomplicated: Secondary | ICD-10-CM | POA: Diagnosis present

## 2023-04-03 DIAGNOSIS — R609 Edema, unspecified: Secondary | ICD-10-CM | POA: Diagnosis not present

## 2023-04-03 DIAGNOSIS — I2510 Atherosclerotic heart disease of native coronary artery without angina pectoris: Secondary | ICD-10-CM | POA: Diagnosis present

## 2023-04-03 DIAGNOSIS — Z8249 Family history of ischemic heart disease and other diseases of the circulatory system: Secondary | ICD-10-CM

## 2023-04-03 DIAGNOSIS — Z4682 Encounter for fitting and adjustment of non-vascular catheter: Secondary | ICD-10-CM | POA: Diagnosis not present

## 2023-04-03 DIAGNOSIS — M4807 Spinal stenosis, lumbosacral region: Secondary | ICD-10-CM | POA: Diagnosis not present

## 2023-04-03 DIAGNOSIS — R569 Unspecified convulsions: Secondary | ICD-10-CM | POA: Diagnosis not present

## 2023-04-03 DIAGNOSIS — K7682 Hepatic encephalopathy: Secondary | ICD-10-CM | POA: Diagnosis present

## 2023-04-03 DIAGNOSIS — T17208A Unspecified foreign body in pharynx causing other injury, initial encounter: Secondary | ICD-10-CM | POA: Diagnosis not present

## 2023-04-03 DIAGNOSIS — J440 Chronic obstructive pulmonary disease with acute lower respiratory infection: Secondary | ICD-10-CM | POA: Diagnosis present

## 2023-04-03 DIAGNOSIS — R680 Hypothermia, not associated with low environmental temperature: Secondary | ICD-10-CM | POA: Diagnosis present

## 2023-04-03 DIAGNOSIS — E1122 Type 2 diabetes mellitus with diabetic chronic kidney disease: Secondary | ICD-10-CM | POA: Diagnosis present

## 2023-04-03 DIAGNOSIS — Z452 Encounter for adjustment and management of vascular access device: Secondary | ICD-10-CM | POA: Diagnosis not present

## 2023-04-03 DIAGNOSIS — G8929 Other chronic pain: Secondary | ICD-10-CM | POA: Diagnosis present

## 2023-04-03 DIAGNOSIS — D6869 Other thrombophilia: Secondary | ICD-10-CM | POA: Diagnosis not present

## 2023-04-03 DIAGNOSIS — Z66 Do not resuscitate: Secondary | ICD-10-CM | POA: Diagnosis not present

## 2023-04-03 DIAGNOSIS — I7121 Aneurysm of the ascending aorta, without rupture: Secondary | ICD-10-CM | POA: Diagnosis not present

## 2023-04-03 DIAGNOSIS — Z794 Long term (current) use of insulin: Secondary | ICD-10-CM | POA: Diagnosis not present

## 2023-04-03 DIAGNOSIS — D630 Anemia in neoplastic disease: Secondary | ICD-10-CM | POA: Diagnosis not present

## 2023-04-03 DIAGNOSIS — Z7901 Long term (current) use of anticoagulants: Secondary | ICD-10-CM

## 2023-04-03 DIAGNOSIS — J811 Chronic pulmonary edema: Secondary | ICD-10-CM | POA: Diagnosis not present

## 2023-04-03 DIAGNOSIS — E785 Hyperlipidemia, unspecified: Secondary | ICD-10-CM | POA: Diagnosis present

## 2023-04-03 DIAGNOSIS — D7589 Other specified diseases of blood and blood-forming organs: Secondary | ICD-10-CM | POA: Diagnosis not present

## 2023-04-03 DIAGNOSIS — Z6832 Body mass index (BMI) 32.0-32.9, adult: Secondary | ICD-10-CM | POA: Diagnosis not present

## 2023-04-03 DIAGNOSIS — J432 Centrilobular emphysema: Secondary | ICD-10-CM | POA: Diagnosis not present

## 2023-04-03 DIAGNOSIS — J9811 Atelectasis: Secondary | ICD-10-CM | POA: Diagnosis not present

## 2023-04-03 DIAGNOSIS — K6389 Other specified diseases of intestine: Secondary | ICD-10-CM | POA: Diagnosis not present

## 2023-04-03 DIAGNOSIS — D649 Anemia, unspecified: Secondary | ICD-10-CM | POA: Diagnosis not present

## 2023-04-03 DIAGNOSIS — I6389 Other cerebral infarction: Secondary | ICD-10-CM | POA: Diagnosis not present

## 2023-04-03 DIAGNOSIS — E1165 Type 2 diabetes mellitus with hyperglycemia: Secondary | ICD-10-CM | POA: Diagnosis present

## 2023-04-03 DIAGNOSIS — I6523 Occlusion and stenosis of bilateral carotid arteries: Secondary | ICD-10-CM | POA: Diagnosis not present

## 2023-04-03 DIAGNOSIS — Z7189 Other specified counseling: Secondary | ICD-10-CM | POA: Diagnosis not present

## 2023-04-03 DIAGNOSIS — I10 Essential (primary) hypertension: Secondary | ICD-10-CM | POA: Diagnosis not present

## 2023-04-03 DIAGNOSIS — Z716 Tobacco abuse counseling: Secondary | ICD-10-CM

## 2023-04-03 DIAGNOSIS — J9690 Respiratory failure, unspecified, unspecified whether with hypoxia or hypercapnia: Secondary | ICD-10-CM | POA: Diagnosis not present

## 2023-04-03 DIAGNOSIS — I63531 Cerebral infarction due to unspecified occlusion or stenosis of right posterior cerebral artery: Secondary | ICD-10-CM | POA: Diagnosis not present

## 2023-04-03 DIAGNOSIS — I639 Cerebral infarction, unspecified: Secondary | ICD-10-CM | POA: Diagnosis not present

## 2023-04-03 DIAGNOSIS — E46 Unspecified protein-calorie malnutrition: Secondary | ICD-10-CM | POA: Diagnosis not present

## 2023-04-03 DIAGNOSIS — R0989 Other specified symptoms and signs involving the circulatory and respiratory systems: Secondary | ICD-10-CM | POA: Diagnosis not present

## 2023-04-03 DIAGNOSIS — R29725 NIHSS score 25: Secondary | ICD-10-CM | POA: Diagnosis not present

## 2023-04-03 DIAGNOSIS — E66811 Obesity, class 1: Secondary | ICD-10-CM | POA: Diagnosis present

## 2023-04-03 DIAGNOSIS — R443 Hallucinations, unspecified: Secondary | ICD-10-CM | POA: Diagnosis present

## 2023-04-03 LAB — LIPASE, BLOOD: Lipase: 27 U/L (ref 11–51)

## 2023-04-03 LAB — I-STAT CG4 LACTIC ACID, ED
Lactic Acid, Venous: 1.7 mmol/L (ref 0.5–1.9)
Lactic Acid, Venous: 1.1 mmol/L (ref 0.5–1.9)

## 2023-04-03 LAB — CBC WITH DIFFERENTIAL/PLATELET
Abs Immature Granulocytes: 0.06 10*3/uL (ref 0.00–0.07)
Basophils Absolute: 0 10*3/uL (ref 0.0–0.1)
Basophils Relative: 1 %
Eosinophils Absolute: 0 10*3/uL (ref 0.0–0.5)
Eosinophils Relative: 1 %
HCT: 38.7 % — ABNORMAL LOW (ref 39.0–52.0)
Hemoglobin: 11.8 g/dL — ABNORMAL LOW (ref 13.0–17.0)
Immature Granulocytes: 1 %
Lymphocytes Relative: 13 %
Lymphs Abs: 1 10*3/uL (ref 0.7–4.0)
MCH: 30.7 pg (ref 26.0–34.0)
MCHC: 30.5 g/dL (ref 30.0–36.0)
MCV: 100.8 fL — ABNORMAL HIGH (ref 80.0–100.0)
Monocytes Absolute: 1 10*3/uL (ref 0.1–1.0)
Monocytes Relative: 12 %
Neutro Abs: 5.9 10*3/uL (ref 1.7–7.7)
Neutrophils Relative %: 72 %
Platelets: 337 10*3/uL (ref 150–400)
RBC: 3.84 MIL/uL — ABNORMAL LOW (ref 4.22–5.81)
RDW: 17.8 % — ABNORMAL HIGH (ref 11.5–15.5)
WBC: 8.1 10*3/uL (ref 4.0–10.5)
nRBC: 0 % (ref 0.0–0.2)

## 2023-04-03 LAB — URINALYSIS, W/ REFLEX TO CULTURE (INFECTION SUSPECTED)
Bacteria, UA: NONE SEEN
Bilirubin Urine: NEGATIVE
Glucose, UA: >=500 mg/dL — AB
Hgb urine dipstick: NEGATIVE
Ketones, ur: NEGATIVE mg/dL
Leukocytes,Ua: NEGATIVE
Nitrite: NEGATIVE
Protein, ur: 100 mg/dL — AB
Specific Gravity, Urine: 1.011 (ref 1.005–1.030)
pH: 7 (ref 5.0–8.0)

## 2023-04-03 LAB — BLOOD GAS, ARTERIAL
Acid-Base Excess: 12.3 mmol/L — ABNORMAL HIGH (ref 0.0–2.0)
Bicarbonate: 35.9 mmol/L — ABNORMAL HIGH (ref 20.0–28.0)
Drawn by: 21338
O2 Saturation: 95.1 %
Patient temperature: 37
pCO2 arterial: 41 mmHg (ref 32–48)
pH, Arterial: 7.55 — ABNORMAL HIGH (ref 7.35–7.45)
pO2, Arterial: 70 mmHg — ABNORMAL LOW (ref 83–108)

## 2023-04-03 LAB — GLUCOSE, CAPILLARY: Glucose-Capillary: 133 mg/dL — ABNORMAL HIGH (ref 70–99)

## 2023-04-03 LAB — COMPREHENSIVE METABOLIC PANEL
ALT: <5 U/L (ref 0–44)
AST: 18 U/L (ref 15–41)
Albumin: 2.1 g/dL — ABNORMAL LOW (ref 3.5–5.0)
Alkaline Phosphatase: 70 U/L (ref 38–126)
Anion gap: 12 (ref 5–15)
BUN: 20 mg/dL (ref 8–23)
CO2: 33 mmol/L — ABNORMAL HIGH (ref 22–32)
Calcium: 11.8 mg/dL — ABNORMAL HIGH (ref 8.9–10.3)
Chloride: 89 mmol/L — ABNORMAL LOW (ref 98–111)
Creatinine, Ser: 2 mg/dL — ABNORMAL HIGH (ref 0.61–1.24)
GFR, Estimated: 37 mL/min — ABNORMAL LOW (ref >60)
Glucose, Bld: 113 mg/dL — ABNORMAL HIGH (ref 70–99)
Potassium: 2.6 mmol/L — CL (ref 3.5–5.1)
Sodium: 134 mmol/L — ABNORMAL LOW (ref 135–145)
Total Bilirubin: 0.8 mg/dL (ref 0.0–1.2)
Total Protein: 10.7 g/dL — ABNORMAL HIGH (ref 6.5–8.1)

## 2023-04-03 LAB — PROTIME-INR
INR: 1.9 — ABNORMAL HIGH (ref 0.8–1.2)
Prothrombin Time: 21.7 s — ABNORMAL HIGH (ref 11.4–15.2)

## 2023-04-03 LAB — HIV ANTIBODY (ROUTINE TESTING W REFLEX): HIV Screen 4th Generation wRfx: NONREACTIVE

## 2023-04-03 LAB — AMMONIA: Ammonia: 24 umol/L (ref 9–35)

## 2023-04-03 LAB — TSH: TSH: 3.591 u[IU]/mL (ref 0.350–4.500)

## 2023-04-03 LAB — RESP PANEL BY RT-PCR (RSV, FLU A&B, COVID)  RVPGX2
Influenza A by PCR: NEGATIVE
Influenza B by PCR: NEGATIVE
Resp Syncytial Virus by PCR: NEGATIVE
SARS Coronavirus 2 by RT PCR: NEGATIVE

## 2023-04-03 LAB — MRSA NEXT GEN BY PCR, NASAL: MRSA by PCR Next Gen: NOT DETECTED

## 2023-04-03 LAB — APTT: aPTT: 32 s (ref 24–36)

## 2023-04-03 LAB — BRAIN NATRIURETIC PEPTIDE: B Natriuretic Peptide: 706.8 pg/mL — ABNORMAL HIGH (ref 0.0–100.0)

## 2023-04-03 LAB — ETHANOL: Alcohol, Ethyl (B): <10 mg/dL (ref <10)

## 2023-04-03 MED ORDER — RIVAROXABAN 20 MG PO TABS
20.0000 mg | ORAL_TABLET | Freq: Every day | ORAL | Status: DC
  Administered 2023-04-03 – 2023-04-05 (×3): 20 mg via ORAL
  Filled 2023-04-03 (×3): qty 1

## 2023-04-03 MED ORDER — POTASSIUM CHLORIDE CRYS ER 20 MEQ PO TBCR: 40.0000 meq | EXTENDED_RELEASE_TABLET | Freq: Once | ORAL | Status: DC

## 2023-04-03 MED ORDER — VANCOMYCIN HCL IN DEXTROSE 1-5 GM/200ML-% IV SOLN: 1000.0000 mg | Freq: Once | INTRAVENOUS | Status: DC

## 2023-04-03 MED ORDER — POTASSIUM CHLORIDE 10 MEQ/100ML IV SOLN
10.0000 meq | INTRAVENOUS | Status: AC
  Administered 2023-04-03: 10 meq via INTRAVENOUS
  Filled 2023-04-03 (×2): qty 100

## 2023-04-03 MED ORDER — SODIUM CHLORIDE 0.9 % IV SOLN
2.0000 g | Freq: Two times a day (BID) | INTRAVENOUS | Status: DC
  Administered 2023-04-03 – 2023-04-05 (×3): 2 g via INTRAVENOUS
  Filled 2023-04-03 (×3): qty 12.5

## 2023-04-03 MED ORDER — LACTATED RINGERS IV SOLN: INTRAVENOUS | Status: DC

## 2023-04-03 MED ORDER — HYDRALAZINE HCL 20 MG/ML IJ SOLN
INTRAMUSCULAR | Status: AC
  Administered 2023-04-03: 10 mg via INTRAVENOUS
  Filled 2023-04-03: qty 1

## 2023-04-03 MED ORDER — UMECLIDINIUM BROMIDE 62.5 MCG/ACT IN AEPB
1.0000 | INHALATION_SPRAY | Freq: Every day | RESPIRATORY_TRACT | Status: DC
  Administered 2023-04-04 – 2023-04-05 (×2): 1 via RESPIRATORY_TRACT
  Filled 2023-04-03: qty 7

## 2023-04-03 MED ORDER — POTASSIUM CHLORIDE 10 MEQ/100ML IV SOLN
10.0000 meq | INTRAVENOUS | Status: AC
Start: 2023-04-03 — End: 2023-04-03
  Administered 2023-04-03 (×4): 10 meq via INTRAVENOUS
  Filled 2023-04-03 (×4): qty 100

## 2023-04-03 MED ORDER — ONDANSETRON HCL 4 MG PO TABS: 4.0000 mg | ORAL_TABLET | Freq: Four times a day (QID) | ORAL | Status: DC | PRN

## 2023-04-03 MED ORDER — LACTATED RINGERS IV BOLUS (SEPSIS)
1000.0000 mL | Freq: Once | INTRAVENOUS | Status: AC
  Administered 2023-04-03: 1000 mL via INTRAVENOUS

## 2023-04-03 MED ORDER — ENSURE ENLIVE PO LIQD
237.0000 mL | Freq: Two times a day (BID) | ORAL | Status: DC
Start: 2023-04-04 — End: 2023-04-07
  Administered 2023-04-04 – 2023-04-05 (×2): 237 mL via ORAL

## 2023-04-03 MED ORDER — FUROSEMIDE 10 MG/ML IJ SOLN
80.0000 mg | Freq: Once | INTRAMUSCULAR | Status: AC
  Administered 2023-04-03: 80 mg via INTRAVENOUS
  Filled 2023-04-03: qty 8

## 2023-04-03 MED ORDER — VANCOMYCIN HCL 1250 MG/250ML IV SOLN: 1250.0000 mg | INTRAVENOUS | Status: DC

## 2023-04-03 MED ORDER — HYDRALAZINE HCL 20 MG/ML IJ SOLN
10.0000 mg | Freq: Four times a day (QID) | INTRAMUSCULAR | Status: DC | PRN
  Administered 2023-04-03 – 2023-04-12 (×11): 10 mg via INTRAVENOUS
  Filled 2023-04-03 (×12): qty 1

## 2023-04-03 MED ORDER — ONDANSETRON HCL 4 MG/2ML IJ SOLN: 4.0000 mg | Freq: Four times a day (QID) | INTRAMUSCULAR | Status: DC | PRN

## 2023-04-03 MED ORDER — ACETAMINOPHEN 325 MG PO TABS
650.0000 mg | ORAL_TABLET | Freq: Four times a day (QID) | ORAL | Status: DC | PRN
  Administered 2023-04-03 – 2023-04-05 (×3): 650 mg via ORAL
  Filled 2023-04-03 (×3): qty 2

## 2023-04-03 MED ORDER — ACETAMINOPHEN 650 MG RE SUPP
650.0000 mg | Freq: Four times a day (QID) | RECTAL | Status: DC | PRN
  Administered 2023-04-07: 650 mg via RECTAL
  Filled 2023-04-03: qty 1

## 2023-04-03 MED ORDER — PNEUMOCOCCAL 20-VAL CONJ VACC 0.5 ML IM SUSY: 0.5000 mL | PREFILLED_SYRINGE | INTRAMUSCULAR | Status: DC

## 2023-04-03 MED ORDER — CHLORHEXIDINE GLUCONATE CLOTH 2 % EX PADS
6.0000 | MEDICATED_PAD | Freq: Every day | CUTANEOUS | Status: DC
  Administered 2023-04-03 – 2023-04-20 (×15): 6 via TOPICAL

## 2023-04-03 MED ORDER — ORAL CARE MOUTH RINSE: 15.0000 mL | OROMUCOSAL | Status: DC | PRN

## 2023-04-03 MED ORDER — ORAL CARE MOUTH RINSE
15.0000 mL | OROMUCOSAL | Status: DC
  Administered 2023-04-03 – 2023-04-08 (×9): 15 mL via OROMUCOSAL

## 2023-04-03 MED ORDER — VANCOMYCIN HCL IN DEXTROSE 1-5 GM/200ML-% IV SOLN
1000.0000 mg | Freq: Once | INTRAVENOUS | Status: AC
  Administered 2023-04-03: 1000 mg via INTRAVENOUS
  Filled 2023-04-03: qty 200

## 2023-04-03 MED ORDER — TRAZODONE HCL 50 MG PO TABS
25.0000 mg | ORAL_TABLET | Freq: Every evening | ORAL | Status: DC | PRN
  Administered 2023-04-03 – 2023-04-04 (×2): 25 mg via ORAL
  Filled 2023-04-03 (×2): qty 1

## 2023-04-03 MED ORDER — PANTOPRAZOLE SODIUM 40 MG PO TBEC
40.0000 mg | DELAYED_RELEASE_TABLET | Freq: Every day | ORAL | Status: DC
  Administered 2023-04-03 – 2023-04-05 (×3): 40 mg via ORAL
  Filled 2023-04-03 (×4): qty 1

## 2023-04-03 MED ORDER — INFLUENZA VIRUS VACC SPLIT PF (FLUZONE) 0.5 ML IM SUSY: 0.5000 mL | PREFILLED_SYRINGE | INTRAMUSCULAR | Status: DC

## 2023-04-03 MED ORDER — FLUTICASONE FUROATE-VILANTEROL 100-25 MCG/ACT IN AEPB
1.0000 | INHALATION_SPRAY | Freq: Every day | RESPIRATORY_TRACT | Status: DC
  Administered 2023-04-04 – 2023-04-05 (×2): 1 via RESPIRATORY_TRACT
  Filled 2023-04-03: qty 28

## 2023-04-03 MED ORDER — ALBUTEROL SULFATE (2.5 MG/3ML) 0.083% IN NEBU
2.5000 mg | INHALATION_SOLUTION | RESPIRATORY_TRACT | Status: DC | PRN
  Administered 2023-04-03 – 2023-04-16 (×2): 2.5 mg via RESPIRATORY_TRACT
  Filled 2023-04-03 (×2): qty 3

## 2023-04-03 MED ORDER — SODIUM CHLORIDE 0.9 % IV SOLN
2.0000 g | Freq: Once | INTRAVENOUS | Status: AC
  Administered 2023-04-03: 2 g via INTRAVENOUS
  Filled 2023-04-03: qty 12.5

## 2023-04-03 MED ORDER — METRONIDAZOLE 500 MG/100ML IV SOLN
500.0000 mg | Freq: Once | INTRAVENOUS | Status: AC
  Administered 2023-04-03: 500 mg via INTRAVENOUS
  Filled 2023-04-03: qty 100

## 2023-04-03 MED ORDER — LORATADINE 10 MG PO TABS
10.0000 mg | ORAL_TABLET | Freq: Every day | ORAL | Status: DC
  Administered 2023-04-04 – 2023-04-05 (×2): 10 mg via ORAL
  Filled 2023-04-03 (×2): qty 1

## 2023-04-03 NOTE — H&P (Signed)
 History and Physical  Ronnie Dunn ZOX:[id number] DOB: [birth year] DOA: 04/03/2023  PCP: [name], MD   Chief Complaint: Weakness, mild confusion  HPI: Ronnie Dunn is a 63 y.o. male with medical history significant for COPD, OSA, permanent atrial fibrillation on Xarelto being admitted to the hospital with suspected sepsis due to community-acquired pneumonia.  History provided by his wife, who states for the last for 5 days he has been incredibly weak, mildly confused.  Has not been wanting to eat or drink anything.  He was hypothermic yesterday temperature 94.  He has not had any fevers, no chills.  Has been vaguely nauseated, and also complaining of low back pain.  For the last 4 to 5 days, he has been coughing up thick discolored sputum.  States that last week due to similar complaints, he completed a course of azithromycin prescribed by his pulmonologist [name], this did not seem to improve his symptoms.  Review of Systems: Please see HPI for pertinent positives and negatives. A complete 10 system review of systems are otherwise negative.  Past Medical History:  Diagnosis Date   Chronic heart failure with preserved ejection fraction (HCC) 06/01/2021   Chronic obstructive pulmonary disease (HCC) 05/28/2021   Class 1 obesity 03/29/2020   Essential hypertension 03/29/2020   Gastroesophageal reflux disease 05/28/2021   Hyponatremia 03/29/2020   OSA (obstructive sleep apnea) 06/01/2021   Permanent atrial fibrillation (HCC) 05/28/2021   Thoracic aortic aneurysm without rupture (HCC) 06/01/2021   Type 2 diabetes mellitus without complication, without long-term current use of insulin (HCC) 05/28/2021   Past Surgical History:  Procedure Laterality Date   BIOPSY  04/05/2022   Procedure: BIOPSY;  Surgeon: Kathi Der, MD;  Location: [facility];  Service: Gastroenterology;;   COLONOSCOPY WITH PROPOFOL N/A 04/05/2022   Procedure: COLONOSCOPY WITH PROPOFOL;  Surgeon:  Kathi Der, MD;  Location: [facility];  Service: Gastroenterology;  Laterality: N/A;   ESOPHAGOGASTRODUODENOSCOPY (EGD) WITH PROPOFOL N/A 04/05/2022   Procedure: ESOPHAGOGASTRODUODENOSCOPY (EGD) WITH PROPOFOL;  Surgeon: Kathi Der, MD;  Location: [facility];  Service: Gastroenterology;  Laterality: N/A;   POLYPECTOMY  04/05/2022   Procedure: POLYPECTOMY;  Surgeon: Kathi Der, MD;  Location: [facility];  Service: Gastroenterology;;   Social History:  reports that he has been smoking cigarettes. He started smoking about 47 years ago. He has a 23.6 pack-year smoking history. He has never used smokeless tobacco. He reports current alcohol use of about 20.0 standard drinks of alcohol per week. He reports that he does not currently use drugs.  Not on File  Family History  Problem Relation Age of Onset   Hypertension Other      Prior to Admission medications   Medication Sig Start Date End Date Taking? Authorizing Provider  cetirizine (ZYRTEC) 10 MG tablet Take 10 mg by mouth daily. 02/14/23  Yes [provider]  albuterol (VENTOLIN HFA) 108 (90 Base) MCG/ACT inhaler Inhale 2 puffs into the lungs every 4 (four) hours as needed for wheezing or shortness of breath. 04/21/21   [provider]  allopurinol (ZYLOPRIM) 300 MG tablet Take 300 mg by mouth daily. 08/02/22   [provider]  atorvastatin (LIPITOR) 10 MG tablet Take 1 tablet (10 mg total) by mouth daily. 09/06/21   Abonza, Maritza, PA-C  B Complex-C (SUPER B COMPLEX PO) Take 1 tablet by mouth daily.    [provider]  budesonide (PULMICORT) 0.5 MG/2ML nebulizer solution Add 1 vial to of saline in rinse bottle. Irrigate  sinuses with through each nostril twice daily 12/16/22   [provider]  Cholecalciferol (VITAMIN D3) 50 MCG (2000 UT) TABS Take 2,000 Units by mouth daily at 6 (six) AM.    [provider]  colchicine 0.6 MG tablet Take 1 tablet (0.6 mg  total) by mouth 2 (two) times daily. Patient taking differently: Take 0.6 mg by mouth as needed (gout). 03/15/22 03/16/23  Dyann Kief, PA-C  dapagliflozin propanediol (FARXIGA) 10 MG TABS tablet Take 1 tablet (10 mg total) by mouth daily before breakfast. 06/01/21   Chandrasekhar, Mahesh A, MD  desloratadine (CLARINEX) 5 MG tablet Take 1 tablet (5 mg total) by mouth daily. 01/02/23   Ashok Croon, MD  famotidine (PEPCID) 40 MG tablet Take 40 mg by mouth daily.    [provider]  Fluticasone-Umeclidin-Vilant (TRELEGY ELLIPTA) 100-62.5-25 MCG/ACT AEPB Inhale 1 puff into the lungs daily. 09/16/21   Mayer Masker, PA-C  folic acid (FOLVITE) 1 MG tablet Take 1 tablet by mouth once daily 10/26/21   Abonza, Maritza, PA-C  furosemide (LASIX) 40 MG tablet Take 120 mg by mouth daily.    [provider]  hydrALAZINE (APRESOLINE) 50 MG tablet Take 1 tablet (50 mg total) by mouth 3 (three) times daily. 11/01/21   Mayer Masker, PA-C  lisinopril (ZESTRIL) 5 MG tablet Take 1 tablet by mouth once daily 10/26/21   Abonza, Maritza, PA-C  magnesium oxide (MAG-OX) 400 MG tablet Take 400 mg by mouth daily.    [provider]  metoprolol succinate (TOPROL-XL) 100 MG 24 hr tablet Take 150 mg by mouth daily. 05/10/21   [provider]  Mometasone Furoate POWD Use .6mg /dose in sinus rinse bottle with 8oz of saline. Irrigate each nostril twice daily 10/03/22   Hedges, Tinnie Gens, PA-C  Multiple Vitamins-Minerals (MULTIVITAMIN WITH MINERALS) tablet Take 1 tablet by mouth daily.    [provider]  pantoprazole (PROTONIX) 40 MG tablet Take 1 tablet by mouth once daily 10/26/21   Abonza, Maritza, PA-C  rivaroxaban (XARELTO) 20 MG TABS tablet TAKE 1 TABLET BY MOUTH ONCE DAILY WITH SUPPER 02/23/23   Chandrasekhar, Mahesh A, MD  sodium chloride (OCEAN) 0.65 % SOLN nasal spray Place 1 spray into both nostrils as needed for congestion.    [provider]  spironolactone  (ALDACTONE) 25 MG tablet Take 1 tablet (25 mg total) by mouth daily. 08/12/21   Mayer Masker, PA-C  Tetrahydrozoline-Zn Sulfate (EYE DROPS RELIEF OP) Place 1 drop into both eyes daily as needed (redness relievee).    [provider]  traZODone (DESYREL) 50 MG tablet Take 50 mg by mouth at bedtime.    [provider]  Turmeric (QC TUMERIC COMPLEX) 500 MG CAPS Take 500 mg by mouth daily at 6 (six) AM.    [provider]  vitamin E 180 MG (400 UNITS) capsule Take 400 Units by mouth daily.    [provider]    Physical Exam: BP (!) 169/95   Pulse 63   Temp 99.4 F (37.4 C) (Rectal)   Resp 17   Ht 6\' 2"  (1.88 m)   Wt 113.4 kg   SpO2 92%   BMI 32.10 kg/m  General: Emaciated, chronically ill-appearing gentleman, wearing 5 L nasal cannula oxygen.  He is awake and alert, oriented to self and place. Eyes: EOMI, clear conjuctivae, white sclerea Neck: supple, no masses, trachea mildline  Cardiovascular: Irregularly irregular, no murmurs or rubs, no peripheral edema  Respiratory: Breath sounds are distant on anterior  exam, with diffuse crackles.  Too weak to sit up for pulmonary exam. Abdomen: soft, nontender, nondistended, normal bowel tones heard  Skin: dry, no rashes  Musculoskeletal: no joint effusions, normal range of motion  Psychiatric: appropriate affect, normal speech  Neurologic: extraocular muscles intact, clear speech, moving all extremities with intact sensorium         Labs on Admission:  Basic Metabolic Panel: Recent Labs  Lab 04/03/23 1118  NA 134*  K 2.6*  CL 89*  CO2 33*  GLUCOSE 113*  BUN 20  CREATININE 2.00*  CALCIUM 11.8*   Liver Function Tests: Recent Labs  Lab 04/03/23 1118  AST 18  ALT <5  ALKPHOS 70  BILITOT 0.8  PROT 10.7*  ALBUMIN 2.1*   Recent Labs  Lab 04/03/23 1118  LIPASE 27   Recent Labs  Lab 04/03/23 1118  AMMONIA 24   CBC: Recent Labs  Lab 04/03/23 1118  WBC 8.1  NEUTROABS 5.9  HGB 11.8*   HCT 38.7*  MCV 100.8*  PLT 337   Cardiac Enzymes: No results for input(s): "CKTOTAL", "CKMB", "CKMBINDEX", "TROPONINI" in the last 168 hours. BNP (last 3 results) Recent Labs    04/03/23 1118  BNP 706.8*    ProBNP (last 3 results) No results for input(s): "PROBNP" in the last 8760 hours.  CBG: No results for input(s): "GLUCAP" in the last 168 hours.  Radiological Exams on Admission: DG Chest Port 1 View Result Date: 04/03/2023 CLINICAL DATA:  Four day history of altered mental status and weakness EXAM: PORTABLE CHEST 1 VIEW COMPARISON:  Chest radiograph dated 06/21/2021 FINDINGS: Normal lung volumes. Increased bilateral interstitial and diffuse patchy opacities. Blunting of the costophrenic angles. No pneumothorax. Similar enlarged cardiomediastinal silhouette. No acute osseous abnormality. IMPRESSION: 1. Increased bilateral interstitial and diffuse patchy opacities, likely pulmonary edema. 2. Blunting of the costophrenic angles, which may represent small pleural effusions. 3. Similar cardiomegaly. Electronically Signed   By: Agustin Cree M.D.   On: 04/03/2023 12:43   Assessment/Plan Ronnie Dunn is a 64 y.o. male with medical history significant for COPD, OSA, permanent atrial fibrillation on Xarelto being admitted to the hospital with suspected sepsis due to community-acquired pneumonia.  Severe sepsis-meeting criteria with hypothermia at home, tachypnea, endorgan dysfunction with AKI.  He also has some altered mental status.  Source is likely community-acquired pneumonia, which failed outpatient management. -Inpatient admission -Treat pneumonia as below -Follow-up blood cultures -Started on aggressive IV fluids, given his elevated BNP and normal lactate, will discontinue maintenance IV fluids  Community-acquired pneumonia-meeting sepsis criteria as above.  He wears oxygen intermittently, currently requiring 5 L nasal cannula to maintain O2 saturations, and has been having  discolored sputum for the last for 5 days. -Empiric IV cefepime and IV vancomycin  Emphysema with acute on chronic hypoxic respiratory failure-I suspect this is due to pneumonia as above, he wears oxygen at home only intermittently. -Treat pneumonia as above, continue supplemental oxygen -Continue Trelegy equivalent  AKI-likely due to relative dehydration from reduced p.o. intake over the last few days. -Avoid nephrotoxins -Continue LR infusion -Follow renal function with daily labs  OSA-CPAP at night  Permanent atrial fibrillation-continue Xarelto    Code Status: Full Code  Consults called: None  Admission status: The appropriate patient status for this patient is INPATIENT. Inpatient status is judged to be reasonable and necessary in order to provide the required intensity of service to ensure the patient's safety. The patient's presenting symptoms, physical exam findings, and initial radiographic and  laboratory data in the context of their chronic comorbidities is felt to place them at high risk for further clinical deterioration. Furthermore, it is not anticipated that the patient will be medically stable for discharge from the hospital within 2 midnights of admission.    I certify that at the point of admission it is my clinical judgment that the patient will require inpatient hospital care spanning beyond 2 midnights from the point of admission due to high intensity of service, high risk for further deterioration and high frequency of surveillance required  Time spent: 48 minutes  Kaila Devries Sharlette Dense MD [facility] Pager [phone]  If 7PM-7AM, please contact night-coverage [email or web address] Password [email or web address]  04/03/2023, 2:10 PM

## 2023-04-03 NOTE — ED Triage Notes (Signed)
 Pt BIBA from home for ams and weakness x4 days. Pt has been in bed all that time, sick for 2 weeks prior. Family reports dark urine. Pt has not had etoh in that time either. 20ga RF  189/107 HR 72 RR 26 96% 4L [place] Cbg 136

## 2023-04-03 NOTE — Progress Notes (Signed)
 Patient arrived from the ED with an oxygen saturation of 88% on 5 LPM and a manual blood pressure of 196/112. He was placed on 7 LPM via HFNC with minimal improvement. He was noted to have expiratory wheezes and crackles bilaterally. The provider was paged, see new medication orders. Prior to transfer, he was increased to 10 LPM via HFNC and his oxygen saturation increased to 92-93%. He was transferred with the assistance of the charge nurse and rapid nurse to step-down without further difficulty or issues. Report given to [facility], Charity fundraiser.

## 2023-04-03 NOTE — Progress Notes (Signed)
 Pharmacy Antibiotic Note  Ronnie Dunn is a 63 y.o. male admitted on 04/03/2023 with pneumonia.  Pharmacy has been consulted for Vanco, Cefepime dosing.  Active Problem(s): Weakness, mild confusion x 5d, s/p course of Azithro AKI,  ID:  - Tmax 99.4, WBC 8.1, Scr 2 - LA 1.7>1.1  Vanco 3/17>> Cefepime 3/17>>  Plan: Cefepime 2 g IV q12 hrs. Vanco 2g IV x 1 Vancomycin 1250 mg IV Q 24 hrs. Goal AUC 400-550. Expected AUC: 503 SCr used: 2    Height: 6\' 2"  (188 cm) Weight: 113.4 kg (250 lb) IBW/kg (Calculated) : 82.2  Temp (24hrs), Avg:99.4 F (37.4 C), Min:99.4 F (37.4 C), Max:99.4 F (37.4 C)  Recent Labs  Lab 04/03/23 1111 04/03/23 1118 04/03/23 1432  WBC  --  8.1  --   CREATININE  --  2.00*  --   LATICACIDVEN 1.7  --  1.1    Estimated Creatinine Clearance: 50.6 mL/min (A) (by C-G formula based on SCr of 2 mg/dL (H)).    No Known Allergies  Eston Heslin S. Merilynn Finland, PharmD, BCPS Clinical Staff Pharmacist  Misty Stanley Stillinger 04/03/2023 2:51 PM

## 2023-04-03 NOTE — Sepsis Progress Note (Signed)
 Elink will follow per sepsis protocol.

## 2023-04-03 NOTE — ED Provider Notes (Signed)
 [place] EMERGENCY DEPARTMENT AT [facility] Provider Note   CSN: [id number] Arrival date & time: 04/03/23  1027     History  No chief complaint on file.   WINSLOW EDERER is a 63 y.o. male.  The history is provided by the patient and medical records. No language interpreter was used.  Illness Location:  Sepsis per EMS Severity:  Severe Onset quality:  Gradual Duration:  1 week Timing:  Constant Progression:  Worsening Chronicity:  New Associated symptoms: congestion, cough, fatigue, fever (subj fever and chills), nausea, shortness of breath and vomiting   Associated symptoms: no abdominal pain, no chest pain, no diarrhea, no headaches, no loss of consciousness, no rash, no rhinorrhea and no wheezing        Home Medications Prior to Admission medications   Medication Sig Start Date End Date Taking? Authorizing Provider  albuterol (VENTOLIN HFA) 108 (90 Base) MCG/ACT inhaler Inhale 2 puffs into the lungs every 4 (four) hours as needed for wheezing or shortness of breath. 04/21/21   [provider]  allopurinol (ZYLOPRIM) 300 MG tablet Take 300 mg by mouth daily. 08/02/22   [provider]  atorvastatin (LIPITOR) 10 MG tablet Take 1 tablet (10 mg total) by mouth daily. 09/06/21   Abonza, Maritza, PA-C  B Complex-C (SUPER B COMPLEX PO) Take 1 tablet by mouth daily.    [provider]  budesonide (PULMICORT) 0.5 MG/2ML nebulizer solution Add 1 vial to of saline in rinse bottle. Irrigate sinuses with through each nostril twice daily 12/16/22   [provider]  Cholecalciferol (VITAMIN D3) 50 MCG (2000 UT) TABS Take 2,000 Units by mouth daily at 6 (six) AM.    [provider]  colchicine 0.6 MG tablet Take 1 tablet (0.6 mg total) by mouth 2 (two) times daily. Patient taking differently: Take 0.6 mg by mouth as needed (gout). 03/15/22 03/16/23  Dyann Kief, PA-C  dapagliflozin propanediol (FARXIGA) 10 MG TABS tablet  Take 1 tablet (10 mg total) by mouth daily before breakfast. 06/01/21   Chandrasekhar, Mahesh A, MD  desloratadine (CLARINEX) 5 MG tablet Take 1 tablet (5 mg total) by mouth daily. 01/02/23   Ashok Croon, MD  famotidine (PEPCID) 40 MG tablet Take 40 mg by mouth daily.    [provider]  Fluticasone-Umeclidin-Vilant (TRELEGY ELLIPTA) 100-62.5-25 MCG/ACT AEPB Inhale 1 puff into the lungs daily. 09/16/21   Mayer Masker, PA-C  folic acid (FOLVITE) 1 MG tablet Take 1 tablet by mouth once daily 10/26/21   Abonza, Maritza, PA-C  furosemide (LASIX) 40 MG tablet Take 120 mg by mouth daily.    [provider]  hydrALAZINE (APRESOLINE) 50 MG tablet Take 1 tablet (50 mg total) by mouth 3 (three) times daily. 11/01/21   Mayer Masker, PA-C  lisinopril (ZESTRIL) 5 MG tablet Take 1 tablet by mouth once daily 10/26/21   Abonza, Maritza, PA-C  magnesium oxide (MAG-OX) 400 MG tablet Take 400 mg by mouth daily.    [provider]  metoprolol succinate (TOPROL-XL) 100 MG 24 hr tablet Take 150 mg by mouth daily. 05/10/21   [provider]  Mometasone Furoate POWD Use .6mg /dose in sinus rinse bottle with 8oz of saline. Irrigate each nostril twice daily 10/03/22   Hedges, Tinnie Gens, PA-C  Multiple Vitamins-Minerals (MULTIVITAMIN WITH MINERALS) tablet Take 1 tablet by mouth daily.    [provider]  pantoprazole (PROTONIX) 40 MG tablet Take 1 tablet by mouth once daily 10/26/21  Mayer Masker, PA-C  rivaroxaban (XARELTO) 20 MG TABS tablet TAKE 1 TABLET BY MOUTH ONCE DAILY WITH SUPPER 02/23/23   Chandrasekhar, Mahesh A, MD  sodium chloride (OCEAN) 0.65 % SOLN nasal spray Place 1 spray into both nostrils as needed for congestion.    [provider]  spironolactone (ALDACTONE) 25 MG tablet Take 1 tablet (25 mg total) by mouth daily. 08/12/21   Mayer Masker, PA-C  Tetrahydrozoline-Zn Sulfate (EYE DROPS RELIEF OP) Place 1 drop into both eyes daily as needed (redness  relievee).    [provider]  traZODone (DESYREL) 50 MG tablet Take 50 mg by mouth at bedtime.    [provider]  Turmeric (QC TUMERIC COMPLEX) 500 MG CAPS Take 500 mg by mouth daily at 6 (six) AM.    [provider]  vitamin E 180 MG (400 UNITS) capsule Take 400 Units by mouth daily.    [provider]      Allergies    Patient has no allergy information on record.    Review of Systems   Review of Systems  Constitutional:  Positive for chills, fatigue and fever (subj fever and chills).  HENT:  Positive for congestion. Negative for rhinorrhea.   Respiratory:  Positive for cough and shortness of breath. Negative for chest tightness and wheezing.   Cardiovascular:  Negative for chest pain, palpitations and leg swelling (chronic).  Gastrointestinal:  Positive for nausea and vomiting. Negative for abdominal pain, constipation and diarrhea.  Genitourinary:  Positive for decreased urine volume (darker). Negative for dysuria and frequency.  Musculoskeletal:  Negative for back pain.  Skin:  Negative for rash and wound.  Neurological:  Negative for loss of consciousness, syncope, weakness, light-headedness and headaches.  Psychiatric/Behavioral:  Negative for agitation and confusion.   All other systems reviewed and are negative.   Physical Exam Updated Vital Signs BP (!) 196/112   Pulse 83   Temp 97.7 F (36.5 C) (Oral)   Resp (!) 26   Ht 6\' 2"  (1.88 m)   Wt 113.4 kg   SpO2 (!) 88%   BMI 32.10 kg/m  Physical Exam Vitals and nursing note reviewed.  Constitutional:      General: He is not in acute distress.    Appearance: He is well-developed. He is not ill-appearing, toxic-appearing or diaphoretic.  HENT:     Head: Normocephalic and atraumatic.     Nose: Congestion present. No rhinorrhea.     Mouth/Throat:     Pharynx: No oropharyngeal exudate or posterior oropharyngeal erythema.  Eyes:     Extraocular Movements: Extraocular movements  intact.     Conjunctiva/sclera: Conjunctivae normal.     Pupils: Pupils are equal, round, and reactive to light.  Cardiovascular:     Rate and Rhythm: Regular rhythm. Tachycardia present.     Pulses: Normal pulses.     Heart sounds: No murmur heard. Pulmonary:     Effort: Pulmonary effort is normal. No respiratory distress.     Breath sounds: Rhonchi and rales present. No wheezing.  Chest:     Chest wall: No tenderness.  Abdominal:     General: Abdomen is flat.     Palpations: Abdomen is soft.     Tenderness: There is no abdominal tenderness. There is no guarding or rebound.  Musculoskeletal:        General: No swelling or tenderness.     Cervical back: Neck supple. No tenderness.     Right lower leg: Edema present.  Left lower leg: Edema present.  Skin:    General: Skin is warm and dry.     Capillary Refill: Capillary refill takes less than 2 seconds.     Findings: No erythema or rash.  Neurological:     General: No focal deficit present.     Mental Status: He is alert.     Sensory: No sensory deficit.     Motor: No weakness.  Psychiatric:        Mood and Affect: Mood normal.     ED Results / Procedures / Treatments   Labs (all labs ordered are listed, but only abnormal results are displayed) Labs Reviewed  COMPREHENSIVE METABOLIC PANEL - Abnormal; Notable for the following components:      Result Value   Sodium 134 (*)    Potassium 2.6 (*)    Chloride 89 (*)    CO2 33 (*)    Glucose, Bld 113 (*)    Creatinine, Ser 2.00 (*)    Calcium 11.8 (*)    Total Protein 10.7 (*)    Albumin 2.1 (*)    GFR, Estimated 37 (*)    All other components within normal limits  CBC WITH DIFFERENTIAL/PLATELET - Abnormal; Notable for the following components:   RBC 3.84 (*)    Hemoglobin 11.8 (*)    HCT 38.7 (*)    MCV 100.8 (*)    RDW 17.8 (*)    All other components within normal limits  PROTIME-INR - Abnormal; Notable for the following components:   Prothrombin Time 21.7  (*)    INR 1.9 (*)    All other components within normal limits  BRAIN NATRIURETIC PEPTIDE - Abnormal; Notable for the following components:   B Natriuretic Peptide 706.8 (*)    All other components within normal limits  URINALYSIS, W/ REFLEX TO CULTURE (INFECTION SUSPECTED) - Abnormal; Notable for the following components:   Glucose, UA >=500 (*)    Protein, ur 100 (*)    All other components within normal limits  RESP PANEL BY RT-PCR (RSV, FLU A&B, COVID)  RVPGX2  CULTURE, BLOOD (ROUTINE X 2)  CULTURE, BLOOD (ROUTINE X 2)  APTT  LIPASE, BLOOD  AMMONIA  TSH  ETHANOL  HIV ANTIBODY (ROUTINE TESTING W REFLEX)  BLOOD GAS, ARTERIAL  I-STAT CG4 LACTIC ACID, ED  I-STAT CG4 LACTIC ACID, ED    EKG EKG Interpretation Date/Time:  Monday April 03 2023 10:52:15 EDT Ventricular Rate:  70 PR Interval:    QRS Duration:  168 QT Interval:  470 QTC Calculation: 508 R Axis:   90  Text Interpretation: Atrial fibrillation Nonspecific intraventricular conduction delay Borderline repol abnormality, diffuse leads when compared to prior, similar afib. No STEMI Confirmed by Theda Belfast ([id number]) on 04/03/2023 11:07:35 AM  Radiology DG Chest Port 1 View Result Date: 04/03/2023 CLINICAL DATA:  Four day history of altered mental status and weakness EXAM: PORTABLE CHEST 1 VIEW COMPARISON:  Chest radiograph dated 06/21/2021 FINDINGS: Normal lung volumes. Increased bilateral interstitial and diffuse patchy opacities. Blunting of the costophrenic angles. No pneumothorax. Similar enlarged cardiomediastinal silhouette. No acute osseous abnormality. IMPRESSION: 1. Increased bilateral interstitial and diffuse patchy opacities, likely pulmonary edema. 2. Blunting of the costophrenic angles, which may represent small pleural effusions. 3. Similar cardiomegaly. Electronically Signed   By: Agustin Cree M.D.   On: 04/03/2023 12:43    Procedures Procedures    CRITICAL CARE Performed by: Canary Brim Anslie Spadafora Total  critical care time: 35 minutes Critical care time was  exclusive of separately billable procedures and treating other patients. Critical care was necessary to treat or prevent imminent or life-threatening deterioration. Critical care was time spent personally by me on the following activities: development of treatment plan with patient and/or surrogate as well as nursing, discussions with consultants, evaluation of patient's response to treatment, examination of patient, obtaining history from patient or surrogate, ordering and performing treatments and interventions, ordering and review of laboratory studies, ordering and review of radiographic studies, pulse oximetry and re-evaluation of patient's condition.  Medications Ordered in ED Medications  lactated ringers infusion (has no administration in time range)  lactated ringers bolus 1,000 mL (has no administration in time range)    And  lactated ringers bolus 1,000 mL (has no administration in time range)  ceFEPIme (MAXIPIME) 2 g in sodium chloride 0.9 % 100 mL IVPB (has no administration in time range)  metroNIDAZOLE (FLAGYL) IVPB 500 mg (has no administration in time range)  vancomycin (VANCOCIN) IVPB 1000 mg/200 mL premix (has no administration in time range)    ED Course/ Medical Decision Making/ A&P                                 Medical Decision Making Amount and/or Complexity of Data Reviewed Labs: ordered. Radiology: ordered.  Risk Prescription drug management. Decision regarding hospitalization.    EJAY LASHLEY is a 63 y.o. male with a past medical history significant for hypertension, diabetes, COPD on home oxygen at night, CHF, sleep apnea, previous seizures, previous alcohol abuse although he reports he has not had alcohol drink for 4 weeks, aortic root dilation, and permanent atrial fibrillation on Xarelto who presents by EMS with concern for sepsis.  According to EMS, he has been feeling worse for the last week with  productive cough and shortness of breath.  He has had nausea and vomiting and has had darkened urine.  EMS was concerned with UTI and due to vital signs were concerning for sepsis.  They report his glucose was about 130 but he was warm to touch, tachypneic, hypoxic, and had a suspected urinary source.  He has not had any seizures report but he has not had alcohol drink in several weeks which is different for him.  He has reported some nausea and vomiting.  He denies constipation or diarrhea.  He reports he is having a dry cough but is denying chest pain or shortness of breath.  His oxygen saturations were in the 80s and he normally does not take oxygen during the daytime, only at night.  He is not reporting any focal pain at this time and specifically denies headache neck pain or neck stiffness.  Denies any trauma or falls.  On my exam, lungs had coarseness bilaterally and he was hypoxic on room air.  He will be placed on oxygen.  He is tachypneic but is not tachycardic.  Chest and abdomen were nontender.  Sides were nontender.  Patient moving all extremities.   he has mild edema in the legs at this time.  Pupil symmetric and reactive with normal extraocular movements.  He is answering questions appropriately.  Will activate code sepsis and will order antibiotics.  Given the patient's history of heart failure, new daytime hypoxia, rhonchi and rales on exam, will be careful with fluid so we will give some fluids to start with but will try not to flood him.  He will remain on oxygen.  Will  get x-ray, urinalysis, and other labs.  Will check for COVID/flu/RSV.  Due to his new daytime hypoxia and his vital signs, anticipate admission for sepsis after workup is further.                  Final Clinical Impression(s) / ED Diagnoses Final diagnoses:  Sepsis, due to unspecified organism, unspecified whether acute organ dysfunction present (HCC)  Pneumonia due to infectious organism, unspecified  laterality, unspecified part of lung  AKI (acute kidney injury) (HCC)  Hypokalemia    Rx / DC Orders ED Discharge Orders     None       Clinical Impression: 1. Sepsis, due to unspecified organism, unspecified whether acute organ dysfunction present (HCC)   2. Pneumonia due to infectious organism, unspecified laterality, unspecified part of lung   3. AKI (acute kidney injury) (HCC)   4. Hypokalemia     Disposition: Admit  This note was prepared with assistance of Dragon voice recognition software. Occasional wrong-word or sound-a-like substitutions may have occurred due to the inherent limitations of voice recognition software.      Brenley Priore, Canary Brim, MD 04/03/23 [phone]

## 2023-04-04 DIAGNOSIS — A419 Sepsis, unspecified organism: Secondary | ICD-10-CM | POA: Diagnosis not present

## 2023-04-04 DIAGNOSIS — J189 Pneumonia, unspecified organism: Secondary | ICD-10-CM | POA: Diagnosis not present

## 2023-04-04 LAB — BASIC METABOLIC PANEL
Anion gap: 12 (ref 5–15)
BUN: 21 mg/dL (ref 8–23)
CO2: 30 mmol/L (ref 22–32)
Calcium: 11.2 mg/dL — ABNORMAL HIGH (ref 8.9–10.3)
Chloride: 93 mmol/L — ABNORMAL LOW (ref 98–111)
Creatinine, Ser: 1.81 mg/dL — ABNORMAL HIGH (ref 0.61–1.24)
GFR, Estimated: 41 mL/min — ABNORMAL LOW (ref >60)
Glucose, Bld: 96 mg/dL (ref 70–99)
Potassium: 2.4 mmol/L — CL (ref 3.5–5.1)
Sodium: 135 mmol/L (ref 135–145)

## 2023-04-04 LAB — CBC
HCT: 36.5 % — ABNORMAL LOW (ref 39.0–52.0)
Hemoglobin: 11.2 g/dL — ABNORMAL LOW (ref 13.0–17.0)
MCH: 30.7 pg (ref 26.0–34.0)
MCHC: 30.7 g/dL (ref 30.0–36.0)
MCV: 100 fL (ref 80.0–100.0)
Platelets: 318 10*3/uL (ref 150–400)
RBC: 3.65 MIL/uL — ABNORMAL LOW (ref 4.22–5.81)
RDW: 17.6 % — ABNORMAL HIGH (ref 11.5–15.5)
WBC: 7.1 10*3/uL (ref 4.0–10.5)
nRBC: 0 % (ref 0.0–0.2)

## 2023-04-04 LAB — MAGNESIUM: Magnesium: 1.3 mg/dL — ABNORMAL LOW (ref 1.7–2.4)

## 2023-04-04 MED ORDER — MAGNESIUM SULFATE 4 GM/100ML IV SOLN
4.0000 g | Freq: Once | INTRAVENOUS | Status: AC
Start: 2023-04-04 — End: 2023-04-04
  Administered 2023-04-04: 4 g via INTRAVENOUS
  Filled 2023-04-04: qty 100

## 2023-04-04 MED ORDER — POTASSIUM CHLORIDE CRYS ER 20 MEQ PO TBCR
40.0000 meq | EXTENDED_RELEASE_TABLET | ORAL | Status: AC
  Administered 2023-04-04 (×2): 40 meq via ORAL
  Filled 2023-04-04 (×2): qty 2

## 2023-04-04 MED ORDER — SALINE SPRAY 0.65 % NA SOLN
1.0000 | NASAL | Status: DC | PRN
  Administered 2023-04-04: 1 via NASAL
  Filled 2023-04-04: qty 44

## 2023-04-04 MED ORDER — SPIRONOLACTONE 25 MG PO TABS
25.0000 mg | ORAL_TABLET | Freq: Every day | ORAL | Status: DC
  Administered 2023-04-04 – 2023-04-05 (×2): 25 mg via ORAL
  Filled 2023-04-04 (×2): qty 1

## 2023-04-04 MED ORDER — NICOTINE 21 MG/24HR TD PT24
21.0000 mg | MEDICATED_PATCH | Freq: Every day | TRANSDERMAL | Status: DC
  Administered 2023-04-05 – 2023-04-20 (×16): 21 mg via TRANSDERMAL
  Filled 2023-04-04 (×17): qty 1

## 2023-04-04 MED ORDER — LORAZEPAM 2 MG/ML IJ SOLN
0.5000 mg | Freq: Once | INTRAMUSCULAR | Status: AC
  Administered 2023-04-04: 0.5 mg via INTRAVENOUS
  Filled 2023-04-04: qty 1

## 2023-04-04 MED ORDER — POTASSIUM CHLORIDE 10 MEQ/100ML IV SOLN
10.0000 meq | INTRAVENOUS | Status: AC
  Administered 2023-04-04 (×6): 10 meq via INTRAVENOUS
  Filled 2023-04-04 (×6): qty 100

## 2023-04-04 MED ORDER — METOPROLOL SUCCINATE ER 25 MG PO TB24
50.0000 mg | ORAL_TABLET | Freq: Every day | ORAL | Status: DC
  Administered 2023-04-04 – 2023-04-05 (×2): 50 mg via ORAL
  Filled 2023-04-04: qty 2
  Filled 2023-04-04: qty 1

## 2023-04-04 MED ORDER — FUROSEMIDE 10 MG/ML IJ SOLN
40.0000 mg | Freq: Two times a day (BID) | INTRAMUSCULAR | Status: DC
  Administered 2023-04-04 – 2023-04-06 (×5): 40 mg via INTRAVENOUS
  Filled 2023-04-04 (×5): qty 4

## 2023-04-04 MED ORDER — FAMOTIDINE 20 MG PO TABS
40.0000 mg | ORAL_TABLET | Freq: Every day | ORAL | Status: DC
  Administered 2023-04-04 – 2023-04-05 (×2): 40 mg via ORAL
  Filled 2023-04-04 (×2): qty 2

## 2023-04-04 MED ORDER — ATORVASTATIN CALCIUM 10 MG PO TABS
10.0000 mg | ORAL_TABLET | Freq: Every day | ORAL | Status: DC
  Administered 2023-04-04 – 2023-04-05 (×2): 10 mg via ORAL
  Filled 2023-04-04 (×2): qty 1

## 2023-04-04 MED ORDER — HYDRALAZINE HCL 50 MG PO TABS
50.0000 mg | ORAL_TABLET | Freq: Three times a day (TID) | ORAL | Status: DC
  Administered 2023-04-04 – 2023-04-07 (×6): 50 mg via ORAL
  Filled 2023-04-04 (×6): qty 1

## 2023-04-04 MED ORDER — GUAIFENESIN ER 600 MG PO TB12
600.0000 mg | ORAL_TABLET | Freq: Two times a day (BID) | ORAL | Status: DC
  Administered 2023-04-04 – 2023-04-05 (×3): 600 mg via ORAL
  Filled 2023-04-04 (×3): qty 1

## 2023-04-04 NOTE — TOC Initial Note (Signed)
 Transition of Care [facility]) - Initial/Assessment Note    Patient Details  Name: Ronnie Dunn MRN: [id number] Date of Birth: March 31, 1960  [facility] of [facility]) CM/SW Contact:    Lanier Clam, RN Phone Number: 04/04/2023, 4:16 PM  Clinical Narrative:  d/c plan home.                 Expected Discharge Plan: Home/Self Care Barriers to Discharge: Continued Medical Work up   Patient Goals and CMS Choice Patient states their goals for this hospitalization and ongoing recovery are:: Home CMS Medicare.gov Compare Post Acute Care list provided to:: Patient Choice offered to / list presented to : Patient      Expected Discharge Plan and Services                                              Prior Living Arrangements/Services                       Activities of Daily Living   ADL Screening (condition at time of admission) Independently performs ADLs?: No Does the patient have a NEW difficulty with bathing/dressing/toileting/self-feeding that is expected to last >3 days?: Yes (Initiates electronic notice to provider for possible OT consult) Does the patient have a NEW difficulty with getting in/out of bed, walking, or climbing stairs that is expected to last >3 days?: Yes (Initiates electronic notice to provider for possible PT consult) Does the patient have a NEW difficulty with communication that is expected to last >3 days?: No Is the patient deaf or have difficulty hearing?: Yes Does the patient have difficulty seeing, even when wearing glasses/contacts?: No Does the patient have difficulty concentrating, remembering, or making decisions?: Yes  Permission Sought/Granted                  Emotional Assessment              Admission diagnosis:  Hypokalemia [E87.6] AKI (acute kidney injury) (HCC) [N17.9] Sepsis due to pneumonia (HCC) [J18.9, A41.9] Pneumonia due to infectious organism, unspecified laterality, unspecified part of lung  [J18.9] Sepsis, due to unspecified organism, unspecified whether acute organ dysfunction present [facility]) [A41.9] Patient Active Problem List   Diagnosis Date Noted   Sepsis due to pneumonia (HCC) 04/03/2023   Aortic root dilation (HCC) 06/28/2022   Acquired thrombophilia (HCC) 06/28/2022   Noncompliance with medication regimen 06/24/2021   Obesity (BMI 30-39.9) 06/23/2021   Seizures (HCC) 06/22/2021   Protein-calorie malnutrition, moderate (HCC) 06/22/2021   Hypomagnesemia 06/22/2021   Alcohol abuse 06/22/2021   OSA (obstructive sleep apnea) 06/01/2021   Chronic heart failure with preserved ejection fraction (HCC) 06/01/2021   Type 2 diabetes mellitus without complication, without long-term current use of insulin (HCC) 05/28/2021   Chronic obstructive pulmonary disease (HCC) 05/28/2021   Gastroesophageal reflux disease 05/28/2021   Lower extremity edema 05/28/2021   Permanent atrial fibrillation (HCC) 05/28/2021   Hyponatremia 03/29/2020   Alcohol use disorder, moderate, dependence (HCC) 03/29/2020   Class 1 obesity 03/29/2020   Essential hypertension 03/29/2020   Seizure (HCC) 03/28/2020   PCP:  [name], MD Pharmacy:   [facility] [address] [place] - [address] [place] [address] Phone: [phone] Fax: [phone]  [facility] [place] [place], [place] - [address]  [address] [place] [address] Phone: [phone] Fax: [phone]     Social Drivers of Health (SDOH) Social History: SDOH Screenings   Food Insecurity: No Food Insecurity (04/03/2023)  Housing: Low Risk  (04/03/2023)  Transportation Needs: No Transportation Needs (04/03/2023)  Utilities: Not At Risk (04/03/2023)  Depression (PHQ2-9): Low Risk  (08/04/2021)  Recent Concern: Depression (PHQ2-9) - Medium Risk (05/28/2021)  Social Connections: Moderately Isolated (04/03/2023)  Tobacco Use: High Risk  (04/03/2023)   SDOH Interventions:     Readmission Risk Interventions    06/23/2021   11:48 AM  Readmission Risk Prevention Plan  Transportation Screening Complete  PCP or Specialist Appt within 5-7 Days Complete  Home Care Screening Complete  Medication Review (RN CM) Complete

## 2023-04-04 NOTE — Progress Notes (Signed)
   04/04/23 1417  Spiritual Encounters  Type of Visit Attempt (pt unavailable)  Reason for visit Advance directives  OnCall Visit No   Attempted to secure AD of HCPOA, however patient was sleeping at the time of visit.

## 2023-04-04 NOTE — Progress Notes (Signed)
 PROGRESS NOTE    ZEPPELIN COMMISSO  WUJ:[id number] DOB: [birth year] DOA: 04/03/2023 PCP: [name], MD    Brief Narrative:   Ronnie Dunn is a 63 y.o. male with past medical history significant for COPD, chronic diastolic congestive heart failure, HTN, HLD, paroxysmal atrial fibrillation on anticoagulation with Xarelto, GERD, OSA, tobacco use disorder, alcohol use disorder, obesity who presented to [facility] ED from home via EMS on 04/03/2022 with complaints of productive cough, fever/chills, nausea, progressive weakness/fatigue and shortness of breath over the last 2 weeks.  Spouse also noted that he was confused, not wanting to eat or drink anything.  Also reported to be hypothermic with temperature 94.0 F.  Also has had complaints of low back pain for some time that is chronic.  Sputum thick, green in appearance.  Recently completed a course of azithromycin prescribed by his pulmonologist, [name] which did not improve his symptoms.  Given progression, confusion spouse called EMS and patient was brought to the ED for further evaluation management.  In the ED, temperature 99.4 F, HR 65, RR 23, BP 145/90, SpO2 88% on room air.  WBC 8.1, hemoglobin 11.8, platelet count 337.  Sodium 134, potassium 2.6, chloride 89, CO2 33, glucose 113, BUN 20, creatinine 2.00.  Lipase 27, AST 18, ALT less than 5, total bilirubin 0.8.  Ammonia level 24.  BNP 706.8.  Lactic acid 1.7.  INR 1.9.  COVID/influenza/RSV PCR negative.  TSH 3.591.  Urinalysis unrevealing.  Chest x-ray with increased bilateral interstitial and diffuse patchy opacities likely pulmonary edema, blunting of the costophrenic angles which may represent small pleural effusions, noted cardiomegaly.  Patient was given 2 L LR bolus, started on vancomycin, cefepime and metronidazole by EDP.  TRH consulted for admission for further evaluation management of acute hypoxic respiratory failure likely secondary to community-acquired  pneumonia.  Assessment & Plan:   Acute hypoxic respiratory failure, POA Community-acquired pneumonia Patient presenting to ED with 2-week history of progressive weakness, fatigue, productive cough with green sputum despite outpatient Amoxil with azithromycin.  Spouse noted that patient was hypoxic satting 85% on room air at home in which she is not oxygen dependent.  Also noted to be confused.  Temperature 99.4 on arrival, SpO2 88% on room air.  WBC 8.1, lactic acid 1.7.  COVID/influenza/RSV PCR negative.  MRSA PCR negative. -- Discontinue vancomycin today as MRSA PCR negative -- Cefepime 2 g IV every 12 hours -- Mucinex 600 mg PO BID -- Continue supplemental oxygen, maintain SpO2 greater than 88%; currently on 6 L HFNC -- Incentive spirometry/flutter valve -- Stable for transfer to progressive unit.  Hypokalemia Potassium 2.4 this morning, likely in the setting of poor oral intake and advance present hospitalization. -- Aggressively replete with IV and p.o. potassium today -- Repeat electrolytes in a.m. daily magnesium -- Monitor on telemetry  Hypomagnesemia Magnesium 1.3, will replete. -- Repeat magnesium level in a.m.  COPD Not oxygen dependent at baseline.  Follows with pulmonology outpatient [name]. On Trelegy Ellipta at baseline -- Breo Ellipta 1 puff daily -- Incruse Ellipta 1 puff daily -- Albuterol neb every 2 hours as needed wheezing/shortness of breath -- Continue supplemental oxygen, maintain SpO2 greater than 88%  Chronic diastolic congestive heart failure, decompensated Essential hypertension Home regimen includes metoprolol succinate 50 mg p.o. daily, hydralazine 50 mg p.o. 3 times daily, spironolactone 25 mg p.o. daily, lisinopril 5 mg p.o. daily, furosemide 120 mg p.o. daily.  Patient with elevated BNP 706.8 and with chest  x-ray findings also concerning for pulmonary edema. -- Continue metoprolol succinate 50 mg p.o. daily -- Hydralazine 50 mg p.o. 3 times  daily -- Spironolactone 25 mg p.o. daily -- Start furosemide 40 mg IV every 12 hours -- Hold home lisinopril -- Strict I's and O's and daily weights -- Repeat BMP, BNP in a.m.  Paroxysmal atrial fibrillation -- Metoprolol succinate 50 mg p.o. daily -- Continue Xarelto  GERD -- Protonix 40 mg p.o. daily  HLD -- Atorvastatin 10 mg p.o. daily  Tobacco use disorder EtOH use disorder Counsel need for complete cessation/abstinence. --Nicotine patch  Obesity, class I Body mass index is 31.53 kg/m.   OSA --Continue nocturnal CPAP   DVT prophylaxis:  rivaroxaban (XARELTO) tablet 20 mg    Code Status: Full Code Family Communication: Updated spouse present at bedside this morning  Disposition Plan:  Level of care: Stepdown Status is: Inpatient Remains inpatient appropriate because: Needs further weaning of supplemental oxygen, IV antibiotics    Consultants:  None  Procedures:  None  Antimicrobials: Vancomycin 3/17 - 3/17 Cefepime 3/17>> Metronidazole 3/17 - 3/17   Subjective: Patient seen examined bedside, lying in bed.  Sleeping but arousable.  Spouse present at bedside.  Reports his dyspnea improved.  Remains on 6 L HFNC; improved from 10 L in which he was requiring on admission.  Continues with productive cough, generalized weakness, fatigue.  Spouse reports that he has been progressively worsening over the last 2 weeks to the point which she was not able to ambulate at home.  Patient with no other specific complaints or concerns at this time.  Patient denies headache, no chest pain, no palpitations, no abdominal pain, no fever/chills, no nausea/vomiting/diarrhea.  No acute events overnight per nursing.  Objective: Vitals:   04/04/23 0619 04/04/23 0753 04/04/23 0800 04/04/23 0943  BP: (!) 186/91  (!) 156/71 (!) 156/58  Pulse:  77 73 77  Resp:  (!) 21 19   Temp:   97.9 F (36.6 C)   TempSrc:   Oral   SpO2:  95% 96%   Weight:      Height:         Intake/Output Summary (Last 24 hours) at 04/04/2023 1009 Last data filed at 04/04/2023 0701 Gross per 24 hour  Intake 2030.97 ml  Output 2150 ml  Net -119.03 ml   Filed Weights   04/03/23 1114 04/03/23 1716  Weight: 113.4 kg 111.4 kg    Examination:  Physical Exam: GEN: NAD, alert, ill in appearance, appears older than stated age HEENT: NCAT, PERRL, EOMI, sclera clear, poor dentition PULM: Diminished breath sounds bilateral bases with rhonchi/crackles, no wheezing, normal respiratory effort without accessory muscle use, on 6 L high flow nasal cannula with SpO2 95% at rest CV: RRR w/o M/G/R GI: abd soft, NTND, + BS MSK: + peripheral edema, moves all EXTR independently NEURO: No focal neurological deficit PSYCH: normal mood/affect Integumentary: No concerning rashes/lesions/wounds noted on exposed skin surfaces    Data Reviewed: I have personally reviewed following labs and imaging studies  CBC: Recent Labs  Lab 04/03/23 1118 04/04/23 0244  WBC 8.1 7.1  NEUTROABS 5.9  --   HGB 11.8* 11.2*  HCT 38.7* 36.5*  MCV 100.8* 100.0  PLT 337 318   Basic Metabolic Panel: Recent Labs  Lab 04/03/23 1118 04/04/23 0244  NA 134* 135  K 2.6* 2.4*  CL 89* 93*  CO2 33* 30  GLUCOSE 113* 96  BUN 20 21  CREATININE 2.00* 1.81*  CALCIUM 11.8*  11.2*  MG  --  1.3*   GFR: Estimated Creatinine Clearance: 55.5 mL/min (A) (by C-G formula based on SCr of 1.81 mg/dL (H)). Liver Function Tests: Recent Labs  Lab 04/03/23 1118  AST 18  ALT <5  ALKPHOS 70  BILITOT 0.8  PROT 10.7*  ALBUMIN 2.1*   Recent Labs  Lab 04/03/23 1118  LIPASE 27   Recent Labs  Lab 04/03/23 1118  AMMONIA 24   Coagulation Profile: Recent Labs  Lab 04/03/23 1118  INR 1.9*   Cardiac Enzymes: No results for input(s): "CKTOTAL", "CKMB", "CKMBINDEX", "TROPONINI" in the last 168 hours. BNP (last 3 results) No results for input(s): "PROBNP" in the last 8760 hours. HbA1C: No results for input(s):  "HGBA1C" in the last 72 hours. CBG: Recent Labs  Lab 04/03/23 1712  GLUCAP 133*   Lipid Profile: No results for input(s): "CHOL", "HDL", "LDLCALC", "TRIG", "CHOLHDL", "LDLDIRECT" in the last 72 hours. Thyroid Function Tests: Recent Labs    04/03/23 1118  TSH 3.591   Anemia Panel: No results for input(s): "VITAMINB12", "FOLATE", "FERRITIN", "TIBC", "IRON", "RETICCTPCT" in the last 72 hours. Sepsis Labs: Recent Labs  Lab 04/03/23 1111 04/03/23 1432  LATICACIDVEN 1.7 1.1    Recent Results (from the past 240 hours)  Resp panel by RT-PCR (RSV, Flu A&B, Covid) Anterior Nasal Swab     Status: None   Collection Time: 04/03/23 11:18 AM   Specimen: Anterior Nasal Swab  Result Value Ref Range Status   SARS Coronavirus 2 by RT PCR NEGATIVE NEGATIVE Final    Comment: (NOTE) SARS-CoV-2 target nucleic acids are NOT DETECTED.  The SARS-CoV-2 RNA is generally detectable in upper respiratory specimens during the acute phase of infection. The lowest concentration of SARS-CoV-2 viral copies this assay can detect is 138 copies/mL. A negative result does not preclude SARS-Cov-2 infection and should not be used as the sole basis for treatment or other patient management decisions. A negative result may occur with  improper specimen collection/handling, submission of specimen other than nasopharyngeal swab, presence of viral mutation(s) within the areas targeted by this assay, and inadequate number of viral copies(<138 copies/mL). A negative result must be combined with clinical observations, patient history, and epidemiological information. The expected result is Negative.  Fact Sheet for Patients:  BloggerCourse.com  Fact Sheet for Healthcare Providers:  SeriousBroker.it  This test is no t yet approved or cleared by the [place] FDA and  has been authorized for detection and/or diagnosis of SARS-CoV-2 by FDA under an Emergency Use  Authorization (EUA). This EUA will remain  in effect (meaning this test can be used) for the duration of the COVID-19 declaration under Section 564(b)(1) of the Act, 21 U.S.C.section 360bbb-3(b)(1), unless the authorization is terminated  or revoked sooner.       Influenza A by PCR NEGATIVE NEGATIVE Final   Influenza B by PCR NEGATIVE NEGATIVE Final    Comment: (NOTE) The Xpert Xpress SARS-CoV-2/FLU/RSV plus assay is intended as an aid in the diagnosis of influenza from Nasopharyngeal swab specimens and should not be used as a sole basis for treatment. Nasal washings and aspirates are unacceptable for Xpert Xpress SARS-CoV-2/FLU/RSV testing.  Fact Sheet for Patients: BloggerCourse.com  Fact Sheet for Healthcare Providers: SeriousBroker.it  This test is not yet approved or cleared by the [place] FDA and has been authorized for detection and/or diagnosis of SARS-CoV-2 by FDA under an Emergency Use Authorization (EUA). This EUA will remain in effect (meaning this test can be used) for  the duration of the COVID-19 declaration under Section 564(b)(1) of the Act, 21 U.S.C. section 360bbb-3(b)(1), unless the authorization is terminated or revoked.     Resp Syncytial Virus by PCR NEGATIVE NEGATIVE Final    Comment: (NOTE) Fact Sheet for Patients: BloggerCourse.com  Fact Sheet for Healthcare Providers: SeriousBroker.it  This test is not yet approved or cleared by the [place] FDA and has been authorized for detection and/or diagnosis of SARS-CoV-2 by FDA under an Emergency Use Authorization (EUA). This EUA will remain in effect (meaning this test can be used) for the duration of the COVID-19 declaration under Section 564(b)(1) of the Act, 21 U.S.C. section 360bbb-3(b)(1), unless the authorization is terminated or revoked.  Performed at [facility],  [address] [place], [place] [address]   Blood Culture (routine x 2)     Status: None (Preliminary result)   Collection Time: 04/03/23 11:18 AM   Specimen: BLOOD  Result Value Ref Range Status   Specimen Description   Final    BLOOD LEFT ANTECUBITAL Performed at [facility], [address], [place], [place] [address]    Special Requests   Final    BOTTLES DRAWN AEROBIC AND ANAEROBIC Blood Culture adequate volume Performed at [facility], [address], [place], [place] [address]    Culture   Final    NO GROWTH < 24 HOURS Performed at [facility], [address], [place], [place] [address]    Report Status PENDING  Incomplete  Blood Culture (routine x 2)     Status: None (Preliminary result)   Collection Time: 04/03/23 11:18 AM   Specimen: BLOOD RIGHT WRIST  Result Value Ref Range Status   Specimen Description   Final    BLOOD RIGHT WRIST Performed at [facility], [address] [place], [place] [address]    Special Requests   Final    BOTTLES DRAWN AEROBIC AND ANAEROBIC Blood Culture adequate volume Performed at [facility], [address], [place], [place] [address]    Culture   Final    NO GROWTH < 24 HOURS Performed at [facility], [address], [place], [place] [address]    Report Status PENDING  Incomplete  MRSA Next Gen by PCR, Nasal     Status: None   Collection Time: 04/03/23  5:17 PM   Specimen: Nasal Mucosa; Nasal Swab  Result Value Ref Range Status   MRSA by PCR Next Gen NOT DETECTED NOT DETECTED Final    Comment: (NOTE) The GeneXpert MRSA Assay (FDA approved for NASAL specimens only), is one component of a comprehensive MRSA colonization surveillance program. It is not intended to diagnose MRSA infection nor to guide or monitor treatment for MRSA infections. Test performance is not FDA approved in patients less than 34 years old. Performed at [facility], [address] [place], [place] [address]          Radiology Studies: [facility] Chest Port 1 View Result Date: 04/03/2023 CLINICAL DATA:  Four day history of altered mental status and weakness EXAM: PORTABLE CHEST 1 VIEW COMPARISON:  Chest radiograph dated 06/21/2021 FINDINGS: Normal lung volumes. Increased bilateral interstitial and diffuse patchy opacities. Blunting of the costophrenic angles. No pneumothorax. Similar enlarged cardiomediastinal silhouette. No acute osseous abnormality. IMPRESSION: 1. Increased bilateral interstitial and diffuse patchy opacities, likely pulmonary edema. 2. Blunting of the costophrenic angles, which may represent small pleural effusions. 3. Similar cardiomegaly.  Electronically Signed   By: Agustin Cree M.D.   On: 04/03/2023 12:43        Scheduled Meds:  atorvastatin  10 mg Oral Daily   Chlorhexidine Gluconate Cloth  6 each Topical Daily   feeding supplement  237 mL Oral BID BM   fluticasone furoate-vilanterol  1 puff Inhalation Daily   And   umeclidinium bromide  1 puff Inhalation Daily   hydrALAZINE  50 mg Oral TID   loratadine  10 mg Oral Daily   metoprolol succinate  50 mg Oral Daily   mouth rinse  15 mL Mouth Rinse 4 times per day   pantoprazole  40 mg Oral Daily   potassium chloride  40 mEq Oral Q3H   rivaroxaban  20 mg Oral Q supper   Continuous Infusions:  ceFEPime (MAXIPIME) IV Stopped (04/03/23 2344)   potassium chloride 10 mEq (04/04/23 0942)     LOS: 1 day    Time spent: 52 minutes spent on 04/04/2023 caring for this patient face-to-face including chart review, ordering labs/tests, documenting, discussion with nursing staff, consultants, updating family and interview/physical exam    Alvira Philips [place], DO [facility] Available via Epic secure chat 7am-7pm After these hours, please refer to coverage provider listed on [email or web address] 04/04/2023, 10:09 AM

## 2023-04-04 NOTE — Progress Notes (Signed)
 PT Cancellation Note  Patient Details Name: Ronnie Dunn MRN: [id number] DOB: January 16, 1961   Cancelled Treatment:    Reason Eval/Treat Not Completed: Medical issues which prohibited therapy Low Potassium, perPT guidelines, will see after more normalized.   Blanchard Kelch PT Acute Rehabilitation Services Office [phone]    [name] 04/04/2023, 7:34 AM

## 2023-04-04 NOTE — Progress Notes (Signed)
 Report called. Wife notified at bedside. Wife took patient's bag of clothes home. Patient stable at time of transfer.

## 2023-04-04 NOTE — Progress Notes (Signed)
   04/04/23 0013  BiPAP/CPAP/SIPAP  BiPAP/CPAP/SIPAP Pt Type Adult  Reason BIPAP/CPAP not in use Non-compliant

## 2023-04-04 NOTE — Progress Notes (Signed)
 Patient refusing CPAP.

## 2023-04-04 NOTE — Progress Notes (Signed)
   04/04/23 1631  Spiritual Encounters  Type of Visit Follow up  Care provided to: Pt and family  Reason for visit Advance directives  OnCall Visit No   Responded to request for AD and provided HCPOA education to the spouse. Patient unable to complete at this time due to alertness. Spouse believes that it may not be needed as she is his next of kin, however she will review and discuss with patient and family. If they decide to pursue, patient will have nurse notify chaplain when ready.

## 2023-04-04 NOTE — Progress Notes (Signed)
 OT Cancellation Note  Patient Details Name: Ronnie Dunn MRN: [id number] DOB: 06-Jan-1961   Cancelled Treatment:    Reason Eval/Treat Not Completed: Medical issues which prohibited therapy Patient has potassium level of 2.4. OT to continue to follow and check back as schedule will allow.  Rosalio Loud, MS Acute Rehabilitation Department Office# [phone]  04/04/2023, 6:58 AM

## 2023-04-05 ENCOUNTER — Inpatient Hospital Stay (HOSPITAL_COMMUNITY)

## 2023-04-05 DIAGNOSIS — A419 Sepsis, unspecified organism: Secondary | ICD-10-CM | POA: Diagnosis not present

## 2023-04-05 DIAGNOSIS — J189 Pneumonia, unspecified organism: Secondary | ICD-10-CM | POA: Diagnosis not present

## 2023-04-05 LAB — BASIC METABOLIC PANEL
Anion gap: 12 (ref 5–15)
BUN: 30 mg/dL — ABNORMAL HIGH (ref 8–23)
CO2: 28 mmol/L (ref 22–32)
Calcium: 11.8 mg/dL — ABNORMAL HIGH (ref 8.9–10.3)
Chloride: 96 mmol/L — ABNORMAL LOW (ref 98–111)
Creatinine, Ser: 1.97 mg/dL — ABNORMAL HIGH (ref 0.61–1.24)
GFR, Estimated: 37 mL/min — ABNORMAL LOW (ref >60)
Glucose, Bld: 108 mg/dL — ABNORMAL HIGH (ref 70–99)
Potassium: 2.8 mmol/L — ABNORMAL LOW (ref 3.5–5.1)
Sodium: 136 mmol/L (ref 135–145)

## 2023-04-05 LAB — CBC
HCT: 37.7 % — ABNORMAL LOW (ref 39.0–52.0)
Hemoglobin: 11.8 g/dL — ABNORMAL LOW (ref 13.0–17.0)
MCH: 30.8 pg (ref 26.0–34.0)
MCHC: 31.3 g/dL (ref 30.0–36.0)
MCV: 98.4 fL (ref 80.0–100.0)
Platelets: 346 10*3/uL (ref 150–400)
RBC: 3.83 MIL/uL — ABNORMAL LOW (ref 4.22–5.81)
RDW: 17.8 % — ABNORMAL HIGH (ref 11.5–15.5)
WBC: 7.9 10*3/uL (ref 4.0–10.5)
nRBC: 0 % (ref 0.0–0.2)

## 2023-04-05 LAB — URINALYSIS, ROUTINE W REFLEX MICROSCOPIC
Bilirubin Urine: NEGATIVE
Glucose, UA: 50 mg/dL — AB
Ketones, ur: NEGATIVE mg/dL
Leukocytes,Ua: NEGATIVE
Nitrite: NEGATIVE
Protein, ur: 100 mg/dL — AB
Specific Gravity, Urine: 1.014 (ref 1.005–1.030)
pH: 7 (ref 5.0–8.0)

## 2023-04-05 LAB — BRAIN NATRIURETIC PEPTIDE: B Natriuretic Peptide: 813.4 pg/mL — ABNORMAL HIGH (ref 0.0–100.0)

## 2023-04-05 LAB — MAGNESIUM: Magnesium: 1.9 mg/dL (ref 1.7–2.4)

## 2023-04-05 LAB — PHOSPHORUS: Phosphorus: 2 mg/dL — ABNORMAL LOW (ref 2.5–4.6)

## 2023-04-05 MED ORDER — HALOPERIDOL LACTATE 5 MG/ML IJ SOLN
1.0000 mg | Freq: Four times a day (QID) | INTRAMUSCULAR | Status: DC | PRN
  Administered 2023-04-05 – 2023-04-13 (×6): 1 mg via INTRAVENOUS
  Filled 2023-04-05 (×9): qty 1

## 2023-04-05 MED ORDER — THIAMINE MONONITRATE 100 MG PO TABS
100.0000 mg | ORAL_TABLET | Freq: Every day | ORAL | Status: DC
  Administered 2023-04-08 – 2023-04-14 (×5): 100 mg via ORAL
  Filled 2023-04-05 (×5): qty 1

## 2023-04-05 MED ORDER — POTASSIUM CHLORIDE CRYS ER 20 MEQ PO TBCR
40.0000 meq | EXTENDED_RELEASE_TABLET | Freq: Once | ORAL | Status: AC
  Administered 2023-04-05: 40 meq via ORAL
  Filled 2023-04-05: qty 2

## 2023-04-05 MED ORDER — LORAZEPAM 1 MG PO TABS: 1.0000 mg | ORAL_TABLET | ORAL | Status: DC | PRN

## 2023-04-05 MED ORDER — GADOBUTROL 1 MMOL/ML IV SOLN
10.0000 mL | Freq: Once | INTRAVENOUS | Status: AC | PRN
  Administered 2023-04-05: 10 mL via INTRAVENOUS

## 2023-04-05 MED ORDER — LORAZEPAM 2 MG/ML IJ SOLN
0.5000 mg | Freq: Once | INTRAMUSCULAR | Status: AC
  Administered 2023-04-05: 0.5 mg via INTRAVENOUS
  Filled 2023-04-05: qty 1

## 2023-04-05 MED ORDER — ADULT MULTIVITAMIN W/MINERALS CH: 1.0000 | ORAL_TABLET | Freq: Every day | ORAL | Status: DC

## 2023-04-05 MED ORDER — POTASSIUM PHOSPHATES 15 MMOLE/5ML IV SOLN
30.0000 mmol | Freq: Once | INTRAVENOUS | Status: AC
  Administered 2023-04-05: 30 mmol via INTRAVENOUS
  Filled 2023-04-05: qty 10

## 2023-04-05 MED ORDER — THIAMINE HCL 100 MG/ML IJ SOLN
100.0000 mg | Freq: Every day | INTRAMUSCULAR | Status: DC
  Administered 2023-04-06 – 2023-04-15 (×6): 100 mg via INTRAVENOUS
  Filled 2023-04-05 (×7): qty 2

## 2023-04-05 MED ORDER — FOLIC ACID 1 MG PO TABS: 1.0000 mg | ORAL_TABLET | Freq: Every day | ORAL | Status: DC

## 2023-04-05 MED ORDER — SODIUM CHLORIDE 0.9 % IV SOLN
2.0000 g | INTRAVENOUS | Status: DC
  Administered 2023-04-05 – 2023-04-06 (×2): 2 g via INTRAVENOUS
  Filled 2023-04-05 (×2): qty 20

## 2023-04-05 MED ORDER — LORAZEPAM 2 MG/ML IJ SOLN
0.5000 mg | Freq: Four times a day (QID) | INTRAMUSCULAR | Status: DC | PRN
  Administered 2023-04-05: 0.5 mg via INTRAVENOUS
  Filled 2023-04-05 (×2): qty 1

## 2023-04-05 MED ORDER — LORAZEPAM 2 MG/ML IJ SOLN
1.0000 mg | INTRAMUSCULAR | Status: DC | PRN
  Administered 2023-04-05: 3 mg via INTRAVENOUS
  Administered 2023-04-05 – 2023-04-06 (×3): 4 mg via INTRAVENOUS
  Administered 2023-04-06 – 2023-04-07 (×5): 2 mg via INTRAVENOUS
  Filled 2023-04-05 (×2): qty 2
  Filled 2023-04-05 (×3): qty 1
  Filled 2023-04-05: qty 2
  Filled 2023-04-05: qty 1
  Filled 2023-04-05: qty 2
  Filled 2023-04-05: qty 1

## 2023-04-05 NOTE — Progress Notes (Addendum)
 PROGRESS NOTE    ARMON ORVIS  YQI:[id number] DOB: 26-Aug-1960 DOA: 04/03/2023 PCP: [name], MD   Brief Narrative: 63 yo male  with past medical history significant for COPD, chronic diastolic congestive heart failure, HTN, HLD, paroxysmal atrial fibrillation on anticoagulation with Xarelto, GERD, OSA, tobacco use disorder, alcohol use disorder, obesity who presented to [facility] ED from home via EMS on 04/03/2022 with complaints of productive cough, fever/chills, nausea, progressive weakness/fatigue and shortness of breath over the last 2 weeks.  Spouse also noted that he was confused, not wanting to eat or drink anything.  Also reported to be hypothermic with temperature 94.0 F.  Also has had complaints of low back pain for past two weeks which is new,no falls reported    Sputum thick, green in appearance.  Recently completed a course of azithromycin prescribed by his pulmonologist, [name] which did not improve his symptoms.  Given progression, confusion spouse called EMS and patient was brought to the ED for further evaluation management.   In the ED, temperature 99.4 F, HR 65, RR 23, BP 145/90, SpO2 88% on room air.  WBC 8.1, hemoglobin 11.8, platelet count 337.  Sodium 134, potassium 2.6, chloride 89, CO2 33, glucose 113, BUN 20, creatinine 2.00.  Lipase 27, AST 18, ALT less than 5, total bilirubin 0.8.  Ammonia level 24.  BNP 706.8.  Lactic acid 1.7.  INR 1.9.  COVID/influenza/RSV PCR negative.  TSH 3.591.  Urinalysis unrevealing.  Chest x-ray with increased bilateral interstitial and diffuse patchy opacities likely pulmonary edema, blunting of the costophrenic angles which may represent small pleural effusions, noted cardiomegaly.  Patient was given 2 L LR bolus, started on vancomycin, cefepime and metronidazole by EDP.  TRH consulted for admission for further evaluation management of acute hypoxic respiratory failure likely secondary to community-acquired  pneumonia.  Assessment & Plan:   Principal Problem:   Sepsis due to pneumonia (HCC)  Acute hypoxic respiratory failure, POA Community-acquired pneumonia Patient presenting to ED with 2-week history of progressive weakness, fatigue, productive cough with green sputum despite outpatient Amoxil with azithromycin.  Spouse noted that patient was hypoxic satting 85% on room air at home, patient is not dependent on oxygen during the day he uses at night with the CPAP to sleep.  In the ER he was 88% on room air COVID RSV and influenza were negative temperature was 99.4 white count 8.1 and lactic acid 1.7.  Initially started on vancomycin and cefepime.  Vancomycin stopped as with MRSA PCR was negative, cefepime was stopped 04/05/2023 due to change in mental status, Rocephin started 04/05/2023.  Continue supplemental oxygen to keep sats above 88%.     Hypokalemia -potassium 2.8 continue to replete mag level 1.9 this was repleted   COPD Not oxygen dependent at baseline.  Follows with pulmonology outpatient [name]. On Trelegy Ellipta at baseline -- Breo Ellipta 1 puff daily -- Incruse Ellipta 1 puff daily -- Albuterol neb every 2 hours as needed wheezing/shortness of breath -- Continue supplemental oxygen, maintain SpO2 greater than 88%   Low back pain for the last 2 weeks per wife MRI lumbar spine  Metabolic encephalopathy likely multifactorial -hypoxic respiratory failure wife reports he is more confused which is not his baseline he is hallucinating UA on admission was negative for UTI However wife reports she saw some blood at the tip of his penis Will repeat UA today Renal ultrasound CT head TSH normal, check ammonia B12 folate  (MCV on the  higher end with history of EtOH)  Chronic diastolic congestive heart failure, decompensated Essential hypertension Home regimen includes metoprolol succinate 50 mg p.o. daily, hydralazine 50 mg p.o. 3 times daily, spironolactone 25 mg p.o. daily,  lisinopril 5 mg p.o. daily, furosemide 120 mg p.o. daily.  Patient with elevated BNP 706.8 and with chest x-ray findings also concerning for pulmonary edema. -- Continue metoprolol succinate 50 mg p.o. daily -- Hydralazine 50 mg p.o. 3 times daily -- Spironolactone 25 mg p.o. daily -- Start furosemide 40 mg IV every 12 hours, noted mild bump in creatinine and follow-up in a.m. -- Hold home lisinopril -- Strict I's and O's and daily weights -- Repeat BMP, BNP in a.m. -Chest x-ray shows fluid overload   Paroxysmal atrial fibrillation -- Metoprolol succinate 50 mg p.o. daily -- Continue Xarelto   GERD -- Protonix 40 mg p.o. daily   HLD -- Atorvastatin 10 mg p.o. daily   Tobacco use disorder EtOH use disorder Counsel need for complete cessation/abstinence. --Nicotine patch   Obesity, class I Body mass index is 31.53 kg/m.    OSA --Continue nocturnal CPAP     DVT prophylaxis:  rivaroxaban (XARELTO) tablet 20 mg    Code Status: Full Code Family Communication: dw wife    Estimated body mass index is 29.55 kg/m as calculated from the following:   Height as of this encounter: 6\' 2"  (1.88 m).   Weight as of this encounter: 104.4 kg.  DVT prophylaxis:xarelto Code Status:full Family Communication:dw wife Disposition Plan:  Status is: Inpatient Remains inpatient appropriate because: acute illness   Consultants: none  Procedures:none  Antimicrobials:rocephin  Subjective: Overnight events noted patient with confusion had mittens placed Ativan 1 dose was given this morning mental still mittens still in place does not follow any commands is awake move all extremities trying to climb out of bed.  Discussed with wife who reported patient drinks about about 6 cans of beer and cut down in the last few weeks or so.  Continues to smoke.  She reports she has patient has hallucination and this is something new.  She also complains of a lower back pain more on the right however he has  no urinary complaints other than confusion.  Notes he is on cefepime, this was stopped and Rocephin was started as cefepime itself can cause acute change in mental status. Normal TSH Objective: Vitals:   04/04/23 1612 04/04/23 1948 04/04/23 2107 04/05/23 0500  BP: (!) 169/88 (!) 150/85 (!) 169/94   Pulse: 71 74 84   Resp: 17 20    Temp: 98 F (36.7 C) (!) 97.3 F (36.3 C)    TempSrc: Oral Oral    SpO2: 94% 92%    Weight:    104.4 kg  Height:        Intake/Output Summary (Last 24 hours) at 04/05/2023 1021 Last data filed at 04/05/2023 0645 Gross per 24 hour  Intake 944.44 ml  Output 1950 ml  Net -1005.56 ml   Filed Weights   04/03/23 1114 04/03/23 1716 04/05/23 0500  Weight: 113.4 kg 111.4 kg 104.4 kg    Examination:  General exam: Appears calm and comfortable  Respiratory system: Clear to auscultation. Respiratory effort normal. Cardiovascular system: S1 & S2 heard, RRR. No JVD, murmurs, rubs, gallops or clicks. No pedal edema. Gastrointestinal system: Abdomen is nondistended, soft and nontender. No organomegaly or masses felt. Normal bowel sounds heard. Central nervous system: mittens in place Trying to climb out Extremities: no edema  Data  Reviewed: I have personally reviewed following labs and imaging studies  CBC: Recent Labs  Lab 04/03/23 1118 04/04/23 0244 04/05/23 0735  WBC 8.1 7.1 7.9  NEUTROABS 5.9  --   --   HGB 11.8* 11.2* 11.8*  HCT 38.7* 36.5* 37.7*  MCV 100.8* 100.0 98.4  PLT 337 318 346   Basic Metabolic Panel: Recent Labs  Lab 04/03/23 1118 04/04/23 0244 04/05/23 0735  NA 134* 135 136  K 2.6* 2.4* 2.8*  CL 89* 93* 96*  CO2 33* 30 28  GLUCOSE 113* 96 108*  BUN 20 21 30*  CREATININE 2.00* 1.81* 1.97*  CALCIUM 11.8* 11.2* 11.8*  MG  --  1.3* 1.9  PHOS  --   --  2.0*   GFR: Estimated Creatinine Clearance: 49.5 mL/min (A) (by C-G formula based on SCr of 1.97 mg/dL (H)). Liver Function Tests: Recent Labs  Lab 04/03/23 1118  AST 18   ALT <5  ALKPHOS 70  BILITOT 0.8  PROT 10.7*  ALBUMIN 2.1*   Recent Labs  Lab 04/03/23 1118  LIPASE 27   Recent Labs  Lab 04/03/23 1118  AMMONIA 24   Coagulation Profile: Recent Labs  Lab 04/03/23 1118  INR 1.9*   Cardiac Enzymes: No results for input(s): "CKTOTAL", "CKMB", "CKMBINDEX", "TROPONINI" in the last 168 hours. BNP (last 3 results) No results for input(s): "PROBNP" in the last 8760 hours. HbA1C: No results for input(s): "HGBA1C" in the last 72 hours. CBG: Recent Labs  Lab 04/03/23 1712  GLUCAP 133*   Lipid Profile: No results for input(s): "CHOL", "HDL", "LDLCALC", "TRIG", "CHOLHDL", "LDLDIRECT" in the last 72 hours. Thyroid Function Tests: Recent Labs    04/03/23 1118  TSH 3.591   Anemia Panel: No results for input(s): "VITAMINB12", "FOLATE", "FERRITIN", "TIBC", "IRON", "RETICCTPCT" in the last 72 hours. Sepsis Labs: Recent Labs  Lab 04/03/23 1111 04/03/23 1432  LATICACIDVEN 1.7 1.1    Recent Results (from the past 240 hours)  Resp panel by RT-PCR (RSV, Flu A&B, Covid) Anterior Nasal Swab     Status: None   Collection Time: 04/03/23 11:18 AM   Specimen: Anterior Nasal Swab  Result Value Ref Range Status   SARS Coronavirus 2 by RT PCR NEGATIVE NEGATIVE Final    Comment: (NOTE) SARS-CoV-2 target nucleic acids are NOT DETECTED.  The SARS-CoV-2 RNA is generally detectable in upper respiratory specimens during the acute phase of infection. The lowest concentration of SARS-CoV-2 viral copies this assay can detect is 138 copies/mL. A negative result does not preclude SARS-Cov-2 infection and should not be used as the sole basis for treatment or other patient management decisions. A negative result may occur with  improper specimen collection/handling, submission of specimen other than nasopharyngeal swab, presence of viral mutation(s) within the areas targeted by this assay, and inadequate number of viral copies(<138 copies/mL). A negative  result must be combined with clinical observations, patient history, and epidemiological information. The expected result is Negative.  Fact Sheet for Patients:  BloggerCourse.com  Fact Sheet for Healthcare Providers:  SeriousBroker.it  This test is no t yet approved or cleared by the [place] FDA and  has been authorized for detection and/or diagnosis of SARS-CoV-2 by FDA under an Emergency Use Authorization (EUA). This EUA will remain  in effect (meaning this test can be used) for the duration of the COVID-19 declaration under Section 564(b)(1) of the Act, 21 U.S.C.section 360bbb-3(b)(1), unless the authorization is terminated  or revoked sooner.       Influenza  A by PCR NEGATIVE NEGATIVE Final   Influenza B by PCR NEGATIVE NEGATIVE Final    Comment: (NOTE) The Xpert Xpress SARS-CoV-2/FLU/RSV plus assay is intended as an aid in the diagnosis of influenza from Nasopharyngeal swab specimens and should not be used as a sole basis for treatment. Nasal washings and aspirates are unacceptable for Xpert Xpress SARS-CoV-2/FLU/RSV testing.  Fact Sheet for Patients: BloggerCourse.com  Fact Sheet for Healthcare Providers: SeriousBroker.it  This test is not yet approved or cleared by the [place] FDA and has been authorized for detection and/or diagnosis of SARS-CoV-2 by FDA under an Emergency Use Authorization (EUA). This EUA will remain in effect (meaning this test can be used) for the duration of the COVID-19 declaration under Section 564(b)(1) of the Act, 21 U.S.C. section 360bbb-3(b)(1), unless the authorization is terminated or revoked.     Resp Syncytial Virus by PCR NEGATIVE NEGATIVE Final    Comment: (NOTE) Fact Sheet for Patients: BloggerCourse.com  Fact Sheet for Healthcare Providers: SeriousBroker.it  This  test is not yet approved or cleared by the [place] FDA and has been authorized for detection and/or diagnosis of SARS-CoV-2 by FDA under an Emergency Use Authorization (EUA). This EUA will remain in effect (meaning this test can be used) for the duration of the COVID-19 declaration under Section 564(b)(1) of the Act, 21 U.S.C. section 360bbb-3(b)(1), unless the authorization is terminated or revoked.  Performed at [facility], [address] [place], [place] [address]   Blood Culture (routine x 2)     Status: None (Preliminary result)   Collection Time: 04/03/23 11:18 AM   Specimen: BLOOD  Result Value Ref Range Status   Specimen Description   Final    BLOOD LEFT ANTECUBITAL Performed at [facility], [address], [place], [place] [address]    Special Requests   Final    BOTTLES DRAWN AEROBIC AND ANAEROBIC Blood Culture adequate volume Performed at [facility], [address], [place], [place] [address]    Culture   Final    NO GROWTH 2 DAYS Performed at [facility], [address], [place], [place] [address]    Report Status PENDING  Incomplete  Blood Culture (routine x 2)     Status: None (Preliminary result)   Collection Time: 04/03/23 11:18 AM   Specimen: BLOOD RIGHT WRIST  Result Value Ref Range Status   Specimen Description   Final    BLOOD RIGHT WRIST Performed at [facility], [address] [place], [place] [address]    Special Requests   Final    BOTTLES DRAWN AEROBIC AND ANAEROBIC Blood Culture adequate volume Performed at [facility], [address], [place], [place] [address]    Culture   Final    NO GROWTH 2 DAYS Performed at [facility], [address], [place], [place] [address]    Report Status PENDING  Incomplete  MRSA Next Gen by PCR, Nasal     Status: None   Collection Time: 04/03/23  5:17 PM   Specimen: Nasal Mucosa; Nasal Swab  Result  Value Ref Range Status   MRSA by PCR Next Gen NOT DETECTED NOT DETECTED Final    Comment: (NOTE) The GeneXpert MRSA Assay (FDA approved for NASAL specimens only), is one component of a comprehensive MRSA colonization surveillance program. It is not intended to diagnose MRSA infection nor to guide or monitor treatment for MRSA infections. Test performance is  not FDA approved in patients less than 41 years old. Performed at [facility], [address] [place], [place] [address]          Radiology Studies: [facility] Chest Port 1 View Result Date: 04/03/2023 CLINICAL DATA:  Four day history of altered mental status and weakness EXAM: PORTABLE CHEST 1 VIEW COMPARISON:  Chest radiograph dated 06/21/2021 FINDINGS: Normal lung volumes. Increased bilateral interstitial and diffuse patchy opacities. Blunting of the costophrenic angles. No pneumothorax. Similar enlarged cardiomediastinal silhouette. No acute osseous abnormality. IMPRESSION: 1. Increased bilateral interstitial and diffuse patchy opacities, likely pulmonary edema. 2. Blunting of the costophrenic angles, which may represent small pleural effusions. 3. Similar cardiomegaly. Electronically Signed   By: Agustin Cree M.D.   On: 04/03/2023 12:43   Scheduled Meds:  atorvastatin  10 mg Oral Daily   Chlorhexidine Gluconate Cloth  6 each Topical Daily   famotidine  40 mg Oral Daily   feeding supplement  237 mL Oral BID BM   fluticasone furoate-vilanterol  1 puff Inhalation Daily   And   umeclidinium bromide  1 puff Inhalation Daily   furosemide  40 mg Intravenous Q12H   guaiFENesin  600 mg Oral BID   hydrALAZINE  50 mg Oral TID   loratadine  10 mg Oral Daily   metoprolol succinate  50 mg Oral Daily   nicotine  21 mg Transdermal Daily   mouth rinse  15 mL Mouth Rinse 4 times per day   pantoprazole  40 mg Oral Daily   potassium chloride  40 mEq Oral Once   rivaroxaban  20 mg Oral Q supper   spironolactone  25 mg Oral Daily    Continuous Infusions:  ceFEPime (MAXIPIME) IV 2 g (04/05/23 0019)   potassium PHOSPHATE IVPB (in mmol)       LOS: 2 days    Time spent: 35 min  Alwyn Ren, MD  04/05/2023, 10:21 AM

## 2023-04-05 NOTE — Evaluation (Signed)
 Occupational Therapy Evaluation Patient Details Name: Ronnie Dunn MRN: [id number] DOB: 04/23/1960 Today's Date: 04/05/2023   History of Present Illness   Patient is 30 male who was admitted on 3/17 with SOB, productive cough, fever/chills and weakness. Patient was admitted with acute hypoxic respiratory failure, CAP, hypokalemia, COPD. NGE:XBMW, chronic diastolic congestive heart failure, HTN, HLD, paroxysmal a fib, GERD.     Clinical Impressions Patient is a 63 year old male who was admitted for above. Patient was living at home with independence in ADLs with no AD prior level. Currently patient is confused with wife present during session to assist with patients ability to engage in session. Patient needed +2 for safety with mitts on during session. Patient was noted to have decreased functional activity tolerance, decreased endurance, decreased standing balance, decreased safety awareness, and decreased knowledge of AD/AE impacting participation in ADLs. Anticipate patient will be able to progress to transition back home with family support with no OT follow up when medically stable.      If plan is discharge home, recommend the following:   A lot of help with bathing/dressing/bathroom;Assistance with cooking/housework;Direct supervision/assist for medications management;Supervision due to cognitive status;Help with stairs or ramp for entrance;Assist for transportation;Direct supervision/assist for financial management;A lot of help with walking and/or transfers     Functional Status Assessment   Patient has had a recent decline in their functional status and demonstrates the ability to make significant improvements in function in a reasonable and predictable amount of time.     Equipment Recommendations   None recommended by OT      Precautions/Restrictions   Precautions Precautions: Fall Restrictions Weight Bearing Restrictions Per Provider Order: No     Mobility  Bed Mobility Overal bed mobility: Needs Assistance Bed Mobility: Supine to Sit, Sit to Supine     Supine to sit: +2 for safety/equipment, Min assist, +2 for physical assistance Sit to supine: +2 for physical assistance, +2 for safety/equipment, Max assist   General bed mobility comments: to advance to EOB and back into bed. patient having hard time sequencing transition back into bed with more A needed          Balance Overall balance assessment: Needs assistance   Sitting balance-Leahy Scale: Fair     Standing balance support: Reliant on assistive device for balance, Bilateral upper extremity supported Standing balance-Leahy Scale: Poor                             ADL either performed or assessed with clinical judgement   ADL Overall ADL's : Needs assistance/impaired Eating/Feeding: Maximal assistance Eating/Feeding Details (indicate cue type and reason): with wife assistance. made no moves to reach for food. Grooming: Sitting;Maximal assistance   Upper Body Bathing: Sitting;Maximal assistance   Lower Body Bathing: Bed level;Total assistance   Upper Body Dressing : Sitting;Maximal assistance   Lower Body Dressing: Bed level;Total assistance   Toilet Transfer: +2 for physical assistance;+2 for safety/equipment;Moderate assistance Toilet Transfer Details (indicate cue type and reason): to stand at bedside with patient needing increased cues for safety Toileting- Clothing Manipulation and Hygiene: Maximal assistance;Sit to/from stand               Vision   Additional Comments: difficult to assess with patients current cog level            Pertinent Vitals/Pain Pain Assessment Pain Assessment: No/denies pain     Extremity/Trunk Assessment Upper Extremity Assessment Upper  Extremity Assessment: Difficult to assess due to impaired cognition   Lower Extremity Assessment Lower Extremity Assessment: Defer to PT evaluation       Communication      Cognition Arousal: Lethargic Behavior During Therapy: Flat affect Cognition: Difficult to assess Difficult to assess due to: Hard of hearing/deaf           OT - Cognition Comments: patient was not communicative with therapist but would say some words to wife. patient with recent ativan administration this AM as well. patient able to follow simple commands with increased time. patient also [facility]                                    Home Living Family/patient expects to be discharged to:: Private residence Living Arrangements: Spouse/significant other Available Help at Discharge: Family;Available 24 hours/day Type of Home: House Home Access: Stairs to enter [employer] of Steps: 3 Entrance Stairs-Rails: Right;Left;Can reach both Home Layout: One level     Bathroom Shower/Tub: Tub/shower unit         Home Equipment: Grab bars - tub/shower          Prior Functioning/Environment Prior Level of Function : Independent/Modified Independent                    OT Problem List: Impaired balance (sitting and/or standing);Decreased knowledge of precautions;Decreased cognition;Decreased knowledge of use of DME or AE;Decreased activity tolerance   OT Treatment/Interventions: Self-care/ADL training;DME and/or AE instruction;Therapeutic activities;Balance training;Energy conservation;Patient/family education      OT Goals(Current goals can be found in the care plan section)   Acute Rehab OT Goals Patient Stated Goal: none stated OT Goal Formulation: With patient/family Time For Goal Achievement: 04/19/23 Potential to Achieve Goals: Fair   OT Frequency:  Min 2X/week    Co-evaluation PT/OT/SLP Co-Evaluation/Treatment: Yes Reason for Co-Treatment: Necessary to address cognition/behavior during functional activity PT goals addressed during session: Mobility/safety with mobility OT goals addressed during session: ADL's and self-care      AM-PAC  OT "6 Clicks" Daily Activity     Outcome Measure Help from another person eating meals?: A Lot Help from another person taking care of personal grooming?: Total Help from another person toileting, which includes using toliet, bedpan, or urinal?: Total Help from another person bathing (including washing, rinsing, drying)?: Total Help from another person to put on and taking off regular upper body clothing?: Total Help from another person to put on and taking off regular lower body clothing?: Total 6 Click Score: 7   End of Session Equipment Utilized During Treatment: Gait belt;Rolling walker (2 wheels);Other (comment) (mitts) Nurse Communication: Mobility status  Activity Tolerance: Patient limited by fatigue Patient left: in bed;with family/visitor present;with call bell/phone within reach;with bed alarm set  OT Visit Diagnosis: Unsteadiness on feet (R26.81);Other abnormalities of gait and mobility (R26.89);Muscle weakness (generalized) (M62.81)                Time: 4098-1191 OT Time Calculation (min): 24 min Charges:  OT General Charges $OT Visit: 1 Visit OT Evaluation $OT Eval Moderate Complexity: 1 Mod  Jakita Dutkiewicz OTR/L, MS Acute Rehabilitation Department Office# [phone]   Selinda Flavin 04/05/2023, 11:40 AM

## 2023-04-05 NOTE — Progress Notes (Signed)
   04/05/23 2251  BiPAP/CPAP/SIPAP  BiPAP/CPAP/SIPAP Pt Type Adult  Reason BIPAP/CPAP not in use Non-compliant

## 2023-04-05 NOTE — Evaluation (Signed)
 Physical Therapy Evaluation Patient Details Name: Ronnie Dunn MRN: [id number] DOB: 05-07-1960 Today's Date: 04/05/2023  History of Present Illness  Patient is 43 male who was admitted on 3/17 with SOB, productive cough, fever/chills and weakness. Patient was admitted with acute hypoxic respiratory failure, CAP, hypokalemia, COPD. GLO:VFIE, chronic diastolic congestive heart failure, HTN, HLD, paroxysmal a fib, GERD.  Clinical Impression  On eval, pt required Min A +2 for safety. He walked ~5 feet with a RW. O2 remained >90% on RA-pt not wearing [place] O2 correctly. Repositioned cannula and placed back on O2 end of session. Able to remove one mitten briefly but pt was pretty much constantly trying to get them off. Assisted pt back to bed at end of session for safety. Wife present during session to provide history. Will plan to follow and progress activity as tolerated. Will recommend HHPT f/u at this time.       If plan is discharge home, recommend the following: A little help with walking and/or transfers;A little help with bathing/dressing/bathroom;Assistance with cooking/housework;Assist for transportation;Help with stairs or ramp for entrance   Can travel by private vehicle        Equipment Recommendations None recommended by PT  Recommendations for Other Services       Functional Status Assessment Patient has had a recent decline in their functional status and demonstrates the ability to make significant improvements in function in a reasonable and predictable amount of time.     Precautions / Restrictions Precautions Precautions: Fall Precaution/Restrictions Comments: monitor O2; mittens currently Restrictions Weight Bearing Restrictions Per Provider Order: No      Mobility  Bed Mobility Overal bed mobility: Needs Assistance Bed Mobility: Supine to Sit, Sit to Supine     Supine to sit: Min assist, +2 for physical assistance, +2 for safety/equipment, HOB elevated, Used  rails Sit to supine: Min assist, +2 for physical assistance, +2 for safety/equipment, HOB elevated, Used rails   General bed mobility comments: Increased time. Cues required. Assist provided as needed.    Transfers Overall transfer level: Needs assistance Equipment used: Rolling walker (2 wheels) Transfers: Sit to/from Stand Sit to Stand: Min assist, +2 physical assistance, +2 safety/equipment, From elevated surface           General transfer comment: Assist to rise, steady, control descent. Cues for safety, technique.    Ambulation/Gait Ambulation/Gait assistance: Min assist, +2 physical assistance, +2 safety/equipment Gait Distance (Feet): 5 Feet Assistive device: Rolling walker (2 wheels) Gait Pattern/deviations: Step-through pattern, Decreased stride length       General Gait Details: Pt took a few steps in room with RW. Pt also would release the walker without notice. Cues for safety. Min A to steady.  Stairs            Wheelchair Mobility     Tilt Bed    Modified Rankin (Stroke Patients Only)       Balance Overall balance assessment: Needs assistance         Standing balance support: Reliant on assistive device for balance, Bilateral upper extremity supported Standing balance-Leahy Scale: Poor                               Pertinent Vitals/Pain Pain Assessment Pain Assessment: No/denies pain    Home Living Family/patient expects to be discharged to:: Private residence Living Arrangements: Spouse/significant other Available Help at Discharge: Family;Available 24 hours/day Type of Home: House Home Access: Stairs  to enter Entrance Stairs-Rails: Right;Left;Can reach both Entrance Stairs-Number of Steps: 3   Home Layout: One level Home Equipment: Grab bars - tub/shower      Prior Function Prior Level of Function : Independent/Modified Independent                     Extremity/Trunk Assessment   Upper Extremity  Assessment Upper Extremity Assessment: Defer to OT evaluation    Lower Extremity Assessment Lower Extremity Assessment: Generalized weakness    Cervical / Trunk Assessment Cervical / Trunk Assessment: Normal  Communication   Communication Communication: Impaired Factors Affecting Communication: Hearing impaired    Cognition Arousal: Alert Behavior During Therapy: Flat affect   PT - Cognitive impairments: Orientation, Memory, Problem solving, Safety/Judgement                         Following commands: Impaired Following commands impaired: Follows one step commands with increased time     Cueing       General Comments      Exercises     Assessment/Plan    PT Assessment Patient needs continued PT services  PT Problem List Decreased strength;Decreased activity tolerance;Decreased balance;Decreased mobility;Decreased knowledge of use of DME;Decreased safety awareness;Decreased cognition       PT Treatment Interventions DME instruction;Gait training;Functional mobility training;Therapeutic activities;Therapeutic exercise;Patient/family education;Balance training    PT Goals (Current goals can be found in the Care Plan section)  Acute Rehab PT Goals Patient Stated Goal: pt unable to state. wife hopeful he returns to baseline PT Goal Formulation: With family Time For Goal Achievement: 04/19/23 Potential to Achieve Goals: Good    Frequency Min 2X/week     Co-evaluation PT/OT/SLP Co-Evaluation/Treatment: Yes Reason for Co-Treatment: Necessary to address cognition/behavior during functional activity PT goals addressed during session: Mobility/safety with mobility OT goals addressed during session: ADL's and self-care       AM-PAC PT "6 Clicks" Mobility  Outcome Measure Help needed turning from your back to your side while in a flat bed without using bedrails?: A Little Help needed moving from lying on your back to sitting on the side of a flat bed  without using bedrails?: A Little Help needed moving to and from a bed to a chair (including a wheelchair)?: A Little Help needed standing up from a chair using your arms (e.g., wheelchair or bedside chair)?: A Little Help needed to walk in hospital room?: A Little Help needed climbing 3-5 steps with a railing? : A Lot 6 Click Score: 17    End of Session Equipment Utilized During Treatment: Oxygen;Gait belt Activity Tolerance: Patient tolerated treatment well Patient left: in bed;with call bell/phone within reach;with bed alarm set;with family/visitor present        Time: 1610-9604 PT Time Calculation (min) (ACUTE ONLY): 16 min   Charges:   PT Evaluation $PT Eval Low Complexity: 1 Low   PT General Charges $$ ACUTE PT VISIT: 1 Visit            Faye Ramsay, PT Acute Rehabilitation  Office: [phone]

## 2023-04-05 NOTE — Progress Notes (Signed)
 Pt was A&O X3 when starting night shift and followed commands. Around 2230, pt took off a gown, removed tele monitor, and attempted to get out of bed. Mittens were on. Hospitalist notified and Ativan was given. Pt has not slept and remains out of bed despite unstable gait. Pt refused to have vital taken. Pt on 4L and SpO2 95-96%.

## 2023-04-06 ENCOUNTER — Inpatient Hospital Stay (HOSPITAL_COMMUNITY)

## 2023-04-06 DIAGNOSIS — A419 Sepsis, unspecified organism: Secondary | ICD-10-CM | POA: Diagnosis not present

## 2023-04-06 DIAGNOSIS — J189 Pneumonia, unspecified organism: Secondary | ICD-10-CM | POA: Diagnosis not present

## 2023-04-06 LAB — BASIC METABOLIC PANEL
Anion gap: 12 (ref 5–15)
BUN: 32 mg/dL — ABNORMAL HIGH (ref 8–23)
CO2: 29 mmol/L (ref 22–32)
Calcium: 11.8 mg/dL — ABNORMAL HIGH (ref 8.9–10.3)
Chloride: 96 mmol/L — ABNORMAL LOW (ref 98–111)
Creatinine, Ser: 1.99 mg/dL — ABNORMAL HIGH (ref 0.61–1.24)
GFR, Estimated: 37 mL/min — ABNORMAL LOW (ref >60)
Glucose, Bld: 128 mg/dL — ABNORMAL HIGH (ref 70–99)
Potassium: 2.6 mmol/L — CL (ref 3.5–5.1)
Sodium: 137 mmol/L (ref 135–145)

## 2023-04-06 LAB — BLOOD GAS, VENOUS
Acid-Base Excess: 10.4 mmol/L — ABNORMAL HIGH (ref 0.0–2.0)
Bicarbonate: 32.8 mmol/L — ABNORMAL HIGH (ref 20.0–28.0)
Drawn by: 8327
O2 Saturation: 91.9 %
Patient temperature: 37.3
pCO2, Ven: 36 mmHg — ABNORMAL LOW (ref 44–60)
pH, Ven: 7.57 — ABNORMAL HIGH (ref 7.25–7.43)
pO2, Ven: 64 mmHg — ABNORMAL HIGH (ref 32–45)

## 2023-04-06 LAB — COMPREHENSIVE METABOLIC PANEL
ALT: 5 U/L (ref 0–44)
AST: 27 U/L (ref 15–41)
Albumin: 2 g/dL — ABNORMAL LOW (ref 3.5–5.0)
Alkaline Phosphatase: 70 U/L (ref 38–126)
Anion gap: 17 — ABNORMAL HIGH (ref 5–15)
BUN: 33 mg/dL — ABNORMAL HIGH (ref 8–23)
CO2: 25 mmol/L (ref 22–32)
Calcium: 12.3 mg/dL — ABNORMAL HIGH (ref 8.9–10.3)
Chloride: 100 mmol/L (ref 98–111)
Creatinine, Ser: 2.1 mg/dL — ABNORMAL HIGH (ref 0.61–1.24)
GFR, Estimated: 35 mL/min — ABNORMAL LOW (ref >60)
Glucose, Bld: 120 mg/dL — ABNORMAL HIGH (ref 70–99)
Potassium: 2.8 mmol/L — ABNORMAL LOW (ref 3.5–5.1)
Sodium: 142 mmol/L (ref 135–145)
Total Bilirubin: 0.8 mg/dL (ref 0.0–1.2)
Total Protein: 11.1 g/dL — ABNORMAL HIGH (ref 6.5–8.1)

## 2023-04-06 LAB — AMMONIA: Ammonia: 55 umol/L — ABNORMAL HIGH (ref 9–35)

## 2023-04-06 LAB — PHOSPHORUS: Phosphorus: 3.9 mg/dL (ref 2.5–4.6)

## 2023-04-06 LAB — GLUCOSE, CAPILLARY: Glucose-Capillary: 119 mg/dL — ABNORMAL HIGH (ref 70–99)

## 2023-04-06 MED ORDER — POTASSIUM CHLORIDE 10 MEQ/100ML IV SOLN
10.0000 meq | INTRAVENOUS | Status: AC
  Administered 2023-04-06 (×6): 10 meq via INTRAVENOUS
  Filled 2023-04-06 (×6): qty 100

## 2023-04-06 MED ORDER — METOPROLOL TARTRATE 5 MG/5ML IV SOLN
5.0000 mg | Freq: Once | INTRAVENOUS | Status: AC
  Administered 2023-04-07: 5 mg via INTRAVENOUS
  Filled 2023-04-06: qty 5

## 2023-04-06 MED ORDER — SODIUM CHLORIDE 0.9 % IV SOLN: INTRAVENOUS | Status: DC

## 2023-04-06 MED ORDER — LACTULOSE ENEMA
300.0000 mL | Freq: Two times a day (BID) | ORAL | Status: DC
  Administered 2023-04-06 (×2): 300 mL via RECTAL
  Filled 2023-04-06 (×3): qty 300

## 2023-04-06 MED ORDER — ZOLEDRONIC ACID 4 MG/100ML IV SOLN
4.0000 mg | Freq: Once | INTRAVENOUS | Status: AC
  Administered 2023-04-06: 4 mg via INTRAVENOUS
  Filled 2023-04-06: qty 100

## 2023-04-06 MED ORDER — DILTIAZEM HCL 25 MG/5ML IV SOLN
20.0000 mg | Freq: Once | INTRAVENOUS | Status: DC
  Filled 2023-04-06: qty 5

## 2023-04-06 MED ORDER — CALCITONIN (SALMON) 200 UNIT/ML IJ SOLN: 100.0000 [IU] | Freq: Two times a day (BID) | INTRAMUSCULAR | Status: DC

## 2023-04-06 NOTE — TOC Initial Note (Addendum)
 Transition of Care [facility]) - Initial/Assessment Note    Patient Details  Name: Ronnie Dunn MRN: [id number] Date of Birth: June 21, 1960  [facility] CM/SW Contact:    Lanier Clam, RN Phone Number: 04/06/2023, 11:01 AM  Clinical Narrative: Spoke to spouse about d/c plans agreed to [facility] in past-rep amy agree to accept for HHPT;already active w/[facility] fro home 02-CPAP;may need home 02 more often rep Mitch following.SA resources added to AVS. Has own transport home.                -12:20p-confirmed only has [facility] home 02 nocturnal-if home 02 needed more often will need documented 02 sats.  Expected Discharge Plan: Home w Home Health Services Barriers to Discharge: Continued Medical Work up   Patient Goals and CMS Choice Patient states their goals for this hospitalization and ongoing recovery are:: Home CMS Medicare.gov Compare Post Acute Care list provided to:: Patient Represenative (must comment) (Rhonda(spouse)) Choice offered to / list presented to : Spouse [place] ownership interest in [facility].provided to:: Spouse    Expected Discharge Plan and Services   Discharge Planning Services: CM Consult Post Acute Care Choice: Home Health Living arrangements for the past 2 months: Single Family Home                           HH Arranged: PT HH Agency: [facility] Date [facility] Agency Contacted: 04/06/23 Time HH Agency Contacted: 1100 Representative spoke with at [facility] Agency: Amy  Prior Living Arrangements/Services Living arrangements for the past 2 months: Single Family Home Lives with:: Spouse              Current home services: DME (CPAP;home 02-[facility])    Activities of Daily Living   ADL Screening (condition at time of admission) Independently performs ADLs?: No Does the patient have a NEW difficulty with bathing/dressing/toileting/self-feeding that is expected to last >3 days?: Yes (Initiates electronic notice  to provider for possible OT consult) Does the patient have a NEW difficulty with getting in/out of bed, walking, or climbing stairs that is expected to last >3 days?: Yes (Initiates electronic notice to provider for possible PT consult) Does the patient have a NEW difficulty with communication that is expected to last >3 days?: No Is the patient deaf or have difficulty hearing?: Yes Does the patient have difficulty seeing, even when wearing glasses/contacts?: No Does the patient have difficulty concentrating, remembering, or making decisions?: Yes  Permission Sought/Granted Permission sought to share information with : Case Manager Permission granted to share information with : Yes, Verbal Permission Granted              Emotional Assessment              Admission diagnosis:  Hypokalemia [E87.6] AKI (acute kidney injury) (HCC) [N17.9] Sepsis due to pneumonia (HCC) [J18.9, A41.9] Pneumonia due to infectious organism, unspecified laterality, unspecified part of lung [J18.9] Sepsis, due to unspecified organism, unspecified whether acute organ dysfunction present [facility]) [A41.9] Patient Active Problem List   Diagnosis Date Noted   Sepsis due to pneumonia (HCC) 04/03/2023   Aortic root dilation (HCC) 06/28/2022   Acquired thrombophilia (HCC) 06/28/2022   Noncompliance with medication regimen 06/24/2021   Obesity (BMI 30-39.9) 06/23/2021   Seizures (HCC) 06/22/2021   Protein-calorie malnutrition, moderate (HCC) 06/22/2021   Hypomagnesemia 06/22/2021   Alcohol abuse 06/22/2021   OSA (obstructive sleep apnea) 06/01/2021   Chronic heart failure with  preserved ejection fraction (HCC) 06/01/2021   Type 2 diabetes mellitus without complication, without long-term current use of insulin (HCC) 05/28/2021   Chronic obstructive pulmonary disease (HCC) 05/28/2021   Gastroesophageal reflux disease 05/28/2021   Lower extremity edema 05/28/2021   Permanent atrial fibrillation (HCC) 05/28/2021    Hyponatremia 03/29/2020   Alcohol use disorder, moderate, dependence (HCC) 03/29/2020   Class 1 obesity 03/29/2020   Essential hypertension 03/29/2020   Seizure (HCC) 03/28/2020   PCP:  [name], MD Pharmacy:   [facility] [address] [place] - [address] [place] [address] Phone: [phone] Fax: [phone]  [facility] [place] [place] - [place], [place] - [address] [place] [address] Phone: [phone] Fax: [phone]     Social Drivers of Health (SDOH) Social History: SDOH Screenings   Food Insecurity: No Food Insecurity (04/03/2023)  Housing: Low Risk  (04/03/2023)  Transportation Needs: No Transportation Needs (04/03/2023)  Utilities: Not At Risk (04/03/2023)  Depression (PHQ2-9): Low Risk  (08/04/2021)  Recent Concern: Depression (PHQ2-9) - Medium Risk (05/28/2021)  Social Connections: Moderately Isolated (04/03/2023)  Tobacco Use: High Risk (04/03/2023)   SDOH Interventions:     Readmission Risk Interventions    06/23/2021   11:48 AM  Readmission Risk Prevention Plan  Transportation Screening Complete  PCP or Specialist Appt within 5-7 Days Complete  Home Care Screening Complete  Medication Review (RN CM) Complete

## 2023-04-06 NOTE — Progress Notes (Addendum)
 PROGRESS NOTE    Ronnie Dunn  XBJ:[id number] DOB: April 11, 1960 DOA: 04/03/2023 PCP: [name], MD   Brief Narrative: 63 yo male  with past medical history significant for COPD, chronic diastolic congestive heart failure, HTN, HLD, paroxysmal atrial fibrillation on anticoagulation with Xarelto, GERD, OSA, tobacco use disorder, alcohol use disorder, obesity who presented to [facility] ED from home via EMS on 04/03/2022 with complaints of productive cough, fever/chills, nausea, progressive weakness/fatigue and shortness of breath over the last 2 weeks.  Spouse also noted that he was confused, not wanting to eat or drink anything.  Also reported to be hypothermic with temperature 94.0 F.  Also has had complaints of low back pain for past two weeks which is new,no falls reported    Sputum thick, green in appearance.  Recently completed a course of azithromycin prescribed by his pulmonologist, [name] which did not improve his symptoms.  Given progression, confusion spouse called EMS and patient was brought to the ED for further evaluation management.   In the ED, temperature 99.4 F, HR 65, RR 23, BP 145/90, SpO2 88% on room air.  WBC 8.1, hemoglobin 11.8, platelet count 337.  Sodium 134, potassium 2.6, chloride 89, CO2 33, glucose 113, BUN 20, creatinine 2.00.  Lipase 27, AST 18, ALT less than 5, total bilirubin 0.8.  Ammonia level 24.  BNP 706.8.  Lactic acid 1.7.  INR 1.9.  COVID/influenza/RSV PCR negative.  TSH 3.591.  Urinalysis unrevealing.  Chest x-ray with increased bilateral interstitial and diffuse patchy opacities likely pulmonary edema, blunting of the costophrenic angles which may represent small pleural effusions, noted cardiomegaly.  Patient was given 2 L LR bolus, started on vancomycin, cefepime and metronidazole by EDP.  TRH consulted for admission for further evaluation management of acute hypoxic respiratory failure likely secondary to community-acquired  pneumonia.  Assessment & Plan:   Principal Problem:   Sepsis due to pneumonia (HCC)  Acute hypoxic respiratory failure, POA Community-acquired pneumonia Patient presenting to ED with 2-week history of progressive weakness, fatigue, productive cough with green sputum despite outpatient Amoxil with azithromycin.  Spouse noted that patient was hypoxic satting 85% on room air at home, patient is not dependent on oxygen during the day he uses at night with the CPAP to sleep.  In the ER he was 88% on room air COVID RSV and influenza were negative temperature was 99.4 white count 8.1 and lactic acid 1.7.  Initially started on vancomycin and cefepime.  Vancomycin stopped as with MRSA PCR was negative, cefepime was stopped 04/05/2023 due to change in mental status, Rocephin started 04/05/2023.  Continue supplemental oxygen to keep sats above 88%.     Hypokalemia -due to diuresis with Lasix, potassium 2.6 continue to replete.  Mag was 1.9 yesterday.     COPD Not oxygen dependent at baseline.  Follows with pulmonology outpatient [name]. On Trelegy Ellipta at baseline -- Breo Ellipta 1 puff daily -- Incruse Ellipta 1 puff daily -- Albuterol neb every 2 hours as needed wheezing/shortness of breath -- Continue supplemental oxygen, maintain SpO2 greater than 88%   Low back pain for the last 2 weeks per wife MRI lumbar spine -lumbar spondylosis, moderate bilateral neuroforaminal stenosis at L5-S1, mild spinal canal stenosis, moderate right lateral recess stenosis at L4-L5  Metabolic encephalopathy likely multifactorial -hypoxic respiratory failure/hyperammonia/EtOH /hypercalcemia- wife reports he is more confused which is not his baseline he is hallucinating UA on admission was negative for UTI However wife reports she saw  some blood at the tip of his penis Repeat UA showed glucose hemoglobin and proteinuria with rare bacteria. Renal ultrasound no hydro  CT head remote left cerebellar infarct TSH  normal, check ammonia B12 folate  Ammonia 55 (MCV on the higher end with history of EtOH)  Chronic diastolic congestive heart failure, decompensated Essential hypertension Home regimen includes metoprolol succinate 50 mg p.o. daily, hydralazine 50 mg p.o. 3 times daily, spironolactone 25 mg p.o. daily, lisinopril 5 mg p.o. daily, furosemide 120 mg p.o. daily.  Patient with elevated BNP 706.8 and with chest x-ray findings also concerning for pulmonary edema. -- Continue metoprolol succinate 50 mg p.o. daily -- Hydralazine 50 mg p.o. 3 times daily -- Spironolactone 25 mg p.o. daily -With poor p.o. intake I will hold Lasix repeat chest x-ray -- Hold home lisinopril -- Strict I's and O's and daily weights -- Repeat BMP, BNP in a.m. -Chest x-ray shows fluid overload   Paroxysmal atrial fibrillation -- Metoprolol succinate 50 mg p.o. daily -- Continue Xarelto   GERD -- Protonix 40 mg p.o. daily   HLD -- Atorvastatin 10 mg p.o. daily   Tobacco use disorder EtOH use disorder Counsel need for complete cessation/abstinence. --Nicotine patch   Obesity, class I Body mass index is 31.53 kg/m.    OSA --Continue nocturnal CPAP   Moderate hypercalcemia -corrected calcium 13.3  Unclear etiology  Zometa  Check vitamin D phosphorus PTH and ionized calcium Unable to give IV fluids due to pulmonary edema   DVT prophylaxis:  rivaroxaban (XARELTO) tablet 20 mg    Code Status: Full Code Family Communication: dw wife    Estimated body mass index is 29.72 kg/m as calculated from the following:   Height as of this encounter: 6\' 2"  (1.88 m).   Weight as of this encounter: 105 kg.  DVT prophylaxis:xarelto Code Status:full Family Communication:dw wife Disposition Plan:  Status is: Inpatient Remains inpatient appropriate because: acute illness   Consultants: none  Procedures:none  Antimicrobials:rocephin  Subjective: Overnight patient was delirious hypertensive tachycardic sweat  she profusely had to use Ativan multiple times  Laying in bed with mittens in place  Not really responding much   objective: Vitals:   04/05/23 2232 04/06/23 0429 04/06/23 0513 04/06/23 0633  BP: (!) 166/80  (!) 184/108 (!) 169/98  Pulse: 93  (!) 103 94  Resp: (!) 24  20   Temp:   (!) 97.5 F (36.4 C)   TempSrc:   Oral   SpO2:   93%   Weight:  105 kg    Height:        Intake/Output Summary (Last 24 hours) at 04/06/2023 1244 Last data filed at 04/06/2023 0945 Gross per 24 hour  Intake 0 ml  Output 1800 ml  Net -1800 ml   Filed Weights   04/03/23 1716 04/05/23 0500 04/06/23 0429  Weight: 111.4 kg 104.4 kg 105 kg    Examination:  General exam: Seen in mittens responds to pain Respiratory system: Clear to auscultation. Respiratory effort normal. Cardiovascular system: S1 & S2 heard, RRR. No JVD, murmurs, rubs, gallops or clicks. No pedal edema. Gastrointestinal system: Abdomen is nondistended, soft and nontender. No organomegaly or masses felt. Normal bowel sounds heard. Central nervous system: mittens in place Trying to climb out Extremities: no edema  Data Reviewed: I have personally reviewed following labs and imaging studies  CBC: Recent Labs  Lab 04/03/23 1118 04/04/23 0244 04/05/23 0735  WBC 8.1 7.1 7.9  NEUTROABS 5.9  --   --  HGB 11.8* 11.2* 11.8*  HCT 38.7* 36.5* 37.7*  MCV 100.8* 100.0 98.4  PLT 337 318 346   Basic Metabolic Panel: Recent Labs  Lab 04/03/23 1118 04/04/23 0244 04/05/23 0735 04/06/23 0515  NA 134* 135 136 137  K 2.6* 2.4* 2.8* 2.6*  CL 89* 93* 96* 96*  CO2 33* 30 28 29   GLUCOSE 113* 96 108* 128*  BUN 20 21 30* 32*  CREATININE 2.00* 1.81* 1.97* 1.99*  CALCIUM 11.8* 11.2* 11.8* 11.8*  MG  --  1.3* 1.9  --   PHOS  --   --  2.0*  --    GFR: Estimated Creatinine Clearance: 49.1 mL/min (A) (by C-G formula based on SCr of 1.99 mg/dL (H)). Liver Function Tests: Recent Labs  Lab 04/03/23 1118  AST 18  ALT <5  ALKPHOS 70   BILITOT 0.8  PROT 10.7*  ALBUMIN 2.1*   Recent Labs  Lab 04/03/23 1118  LIPASE 27   Recent Labs  Lab 04/03/23 1118 04/06/23 0515  AMMONIA 24 55*   Coagulation Profile: Recent Labs  Lab 04/03/23 1118  INR 1.9*   Cardiac Enzymes: No results for input(s): "CKTOTAL", "CKMB", "CKMBINDEX", "TROPONINI" in the last 168 hours. BNP (last 3 results) No results for input(s): "PROBNP" in the last 8760 hours. HbA1C: No results for input(s): "HGBA1C" in the last 72 hours. CBG: Recent Labs  Lab 04/03/23 1712  GLUCAP 133*   Lipid Profile: No results for input(s): "CHOL", "HDL", "LDLCALC", "TRIG", "CHOLHDL", "LDLDIRECT" in the last 72 hours. Thyroid Function Tests: No results for input(s): "TSH", "T4TOTAL", "FREET4", "T3FREE", "THYROIDAB" in the last 72 hours.  Anemia Panel: No results for input(s): "VITAMINB12", "FOLATE", "FERRITIN", "TIBC", "IRON", "RETICCTPCT" in the last 72 hours. Sepsis Labs: Recent Labs  Lab 04/03/23 1111 04/03/23 1432  LATICACIDVEN 1.7 1.1    Recent Results (from the past 240 hours)  Resp panel by RT-PCR (RSV, Flu A&B, Covid) Anterior Nasal Swab     Status: None   Collection Time: 04/03/23 11:18 AM   Specimen: Anterior Nasal Swab  Result Value Ref Range Status   SARS Coronavirus 2 by RT PCR NEGATIVE NEGATIVE Final    Comment: (NOTE) SARS-CoV-2 target nucleic acids are NOT DETECTED.  The SARS-CoV-2 RNA is generally detectable in upper respiratory specimens during the acute phase of infection. The lowest concentration of SARS-CoV-2 viral copies this assay can detect is 138 copies/mL. A negative result does not preclude SARS-Cov-2 infection and should not be used as the sole basis for treatment or other patient management decisions. A negative result may occur with  improper specimen collection/handling, submission of specimen other than nasopharyngeal swab, presence of viral mutation(s) within the areas targeted by this assay, and inadequate  number of viral copies(<138 copies/mL). A negative result must be combined with clinical observations, patient history, and epidemiological information. The expected result is Negative.  Fact Sheet for Patients:  BloggerCourse.com  Fact Sheet for Healthcare Providers:  SeriousBroker.it  This test is no t yet approved or cleared by the [place] FDA and  has been authorized for detection and/or diagnosis of SARS-CoV-2 by FDA under an Emergency Use Authorization (EUA). This EUA will remain  in effect (meaning this test can be used) for the duration of the COVID-19 declaration under Section 564(b)(1) of the Act, 21 U.S.C.section 360bbb-3(b)(1), unless the authorization is terminated  or revoked sooner.       Influenza A by PCR NEGATIVE NEGATIVE Final   Influenza B by PCR NEGATIVE NEGATIVE Final  Comment: (NOTE) The Xpert Xpress SARS-CoV-2/FLU/RSV plus assay is intended as an aid in the diagnosis of influenza from Nasopharyngeal swab specimens and should not be used as a sole basis for treatment. Nasal washings and aspirates are unacceptable for Xpert Xpress SARS-CoV-2/FLU/RSV testing.  Fact Sheet for Patients: BloggerCourse.com  Fact Sheet for Healthcare Providers: SeriousBroker.it  This test is not yet approved or cleared by the [place] FDA and has been authorized for detection and/or diagnosis of SARS-CoV-2 by FDA under an Emergency Use Authorization (EUA). This EUA will remain in effect (meaning this test can be used) for the duration of the COVID-19 declaration under Section 564(b)(1) of the Act, 21 U.S.C. section 360bbb-3(b)(1), unless the authorization is terminated or revoked.     Resp Syncytial Virus by PCR NEGATIVE NEGATIVE Final    Comment: (NOTE) Fact Sheet for Patients: BloggerCourse.com  Fact Sheet for Healthcare  Providers: SeriousBroker.it  This test is not yet approved or cleared by the [place] FDA and has been authorized for detection and/or diagnosis of SARS-CoV-2 by FDA under an Emergency Use Authorization (EUA). This EUA will remain in effect (meaning this test can be used) for the duration of the COVID-19 declaration under Section 564(b)(1) of the Act, 21 U.S.C. section 360bbb-3(b)(1), unless the authorization is terminated or revoked.  Performed at [facility], [address], [place], [place] [address]   Blood Culture (routine x 2)     Status: None (Preliminary result)   Collection Time: 04/03/23 11:18 AM   Specimen: BLOOD  Result Value Ref Range Status   Specimen Description   Final    BLOOD LEFT ANTECUBITAL Performed at [facility], [address] [place], [place] [address]    Special Requests   Final    BOTTLES DRAWN AEROBIC AND ANAEROBIC Blood Culture adequate volume Performed at [facility], [address], [place], [place] [address]    Culture   Final    NO GROWTH 3 DAYS Performed at [facility], [address] [place], [place] [address]    Report Status PENDING  Incomplete  Blood Culture (routine x 2)     Status: None (Preliminary result)   Collection Time: 04/03/23 11:18 AM   Specimen: BLOOD RIGHT WRIST  Result Value Ref Range Status   Specimen Description   Final    BLOOD RIGHT WRIST Performed at [facility], [address] [place], [place] [address]    Special Requests   Final    BOTTLES DRAWN AEROBIC AND ANAEROBIC Blood Culture adequate volume Performed at [facility], [address] [place], [place] [address]    Culture   Final    NO GROWTH 3 DAYS Performed at [facility], [address], [place], [place] [address]    Report Status PENDING  Incomplete  MRSA Next Gen by PCR, Nasal     Status: None   Collection Time:  04/03/23  5:17 PM   Specimen: Nasal Mucosa; Nasal Swab  Result Value Ref Range Status   MRSA by PCR Next Gen NOT DETECTED NOT DETECTED Final    Comment: (NOTE) The GeneXpert MRSA Assay (FDA approved for NASAL specimens only), is one component of a comprehensive MRSA colonization surveillance program. It is not intended to diagnose MRSA infection nor to guide or monitor treatment for MRSA infections. Test performance is not FDA approved in patients less than 82 years old. Performed at [facility], [address] [place], [place] [address]          Radiology Studies: US RENAL Result Date: 04/05/2023 CLINICAL DATA:  Abdominal pain. EXAM: RENAL / URINARY TRACT ULTRASOUND COMPLETE COMPARISON:  None Available. FINDINGS: Right Kidney: Renal measurements: 11.4 x 7.1 x 5 cm = volume: 210 mL. Normal parenchymal echogenicity. No hydronephrosis. No visualized stone or focal lesion. Left Kidney: Renal measurements: 14 x 7.6 x 5.6 cm = volume: 313 mL. Normal parenchymal echogenicity. No hydronephrosis. No visualized stone or focal lesion. Bladder: Not visualized.  The patient voided prior to the exam. Other: None. IMPRESSION: 1. Normal sonographic appearance of the kidneys. 2. Urinary bladder is decompressed and not assessed on the current exam. Electronically Signed   By: Narda Rutherford M.D.   On: 04/05/2023 19:45   CT HEAD WO CONTRAST ( ) Result Date: 04/05/2023 CLINICAL DATA:  Initial evaluation for mental status change, unknown cause. EXAM: CT HEAD WITHOUT CONTRAST TECHNIQUE: Contiguous axial images were obtained from the base of the skull through the vertex without intravenous contrast. RADIATION DOSE REDUCTION: This exam was performed according to the departmental dose-optimization program which includes automated exposure control, adjustment of the mA and/or kV according to patient size and/or use of iterative reconstruction technique. COMPARISON:  CT from 06/21/2021  FINDINGS: Brain: Cerebral volume within normal limits. 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 No acute intracranial hemorrhage. No acute large vessel territory infarct. No mass lesion or midline shift. No hydrocephalus or extra-axial fluid collection. Vascular: No abnormal hyperdense vessel. Calcified atherosclerosis present at the skull base. Skull: Scalp soft tissues within normal limits.  Calvarium intact. Sinuses/Orbits: Globes and orbital soft tissues within normal limits. Paranasal sinuses are largely clear. No significant mastoid effusion. Other: None. IMPRESSION: 1. No acute intracranial abnormality. 2. 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 Electronically Signed   By: Rise Mu M.D.   On: 04/05/2023 19:27   MR Lumbar Spine W Wo Contrast Result Date: 04/05/2023 CLINICAL DATA:  Acute low back pain for the past 2 weeks. EXAM: MRI LUMBAR SPINE WITHOUT AND WITH CONTRAST TECHNIQUE: Multiplanar and multiecho pulse sequences of the lumbar spine were obtained without and with intravenous contrast. CONTRAST:  10mL GADAVIST GADOBUTROL 1 MMOL/ML IV SOLN COMPARISON:  None Available. FINDINGS: Segmentation: Assumed standard. The last well-formed disc space is designated L5-S1 for the purposes of this report. Alignment:  Physiologic. Vertebrae: No fracture or evidence of discitis. Decreased T2 and T1 marrow signal with diffuse low-level bone marrow increased STIR signal and enhancement. No focal bone lesion. Schmorl's node involving the right aspect of the L2 superior endplate. Conus medullaris and cauda equina: Conus extends to the L1 level. Conus and cauda equina appear normal. No abnormal intrathecal enhancement. Paraspinal and other soft tissues: Infrarenal abdominal aortic aneurysm measuring 3.9 cm. Disc levels: T12-L1:  Negative. L1-L2:  Minimal disc bulging.  No stenosis. L2-L3:   Negative disc.  Mild left facet arthropathy.  No stenosis. L3-L4: Mild disc bulging and bilateral facet arthropathy. Prominent posterior epidural fat. Mild spinal canal stenosis. No neuroforaminal stenosis. L4-L5: Mild disc bulging with bulky right far lateral disc osteophyte complex. Mild bilateral facet arthropathy. Prominent posterior epidural fat. Mild spinal canal stenosis. Moderate right lateral recess stenosis. Mild right neuroforaminal stenosis. No left neuroforaminal stenosis. L5-S1: Disc bulging and endplate spurring asymmetric to the right. Prominent right foraminal disc protrusion. Mild bilateral facet arthropathy. Moderate bilateral neuroforaminal stenosis. No spinal canal  stenosis. IMPRESSION: 1. Multilevel lumbar spondylosis as described above. Moderate bilateral neuroforaminal stenosis at L5-S1. 2. Mild spinal canal stenosis and moderate right lateral recess stenosis at L4-L5. 3. Diffuse low-level bone marrow increased STIR signal and enhancement, nonspecific but can be seen in the setting of anemia, smoking, obesity, or other hematologic disorders. 4. 3.9 cm infrarenal abdominal aortic aneurysm. Recommend follow-up ultrasound in 3 years. Electronically Signed   By: Obie Dredge M.D.   On: 04/05/2023 19:19   Scheduled Meds:  Chlorhexidine Gluconate Cloth  6 each Topical Daily   famotidine  40 mg Oral Daily   feeding supplement  237 mL Oral BID BM   fluticasone furoate-vilanterol  1 puff Inhalation Daily   And   umeclidinium bromide  1 puff Inhalation Daily   folic acid  1 mg Oral Daily   guaiFENesin  600 mg Oral BID   hydrALAZINE  50 mg Oral TID   lactulose  300 mL Rectal BID   loratadine  10 mg Oral Daily   metoprolol succinate  50 mg Oral Daily   multivitamin with minerals  1 tablet Oral Daily   nicotine  21 mg Transdermal Daily   mouth rinse  15 mL Mouth Rinse 4 times per day   pantoprazole  40 mg Oral Daily   rivaroxaban  20 mg Oral Q supper   spironolactone  25 mg Oral  Daily   thiamine  100 mg Oral Daily   Or   thiamine  100 mg Intravenous Daily   Continuous Infusions:  cefTRIAXone (ROCEPHIN)  IV 2 g (04/05/23 1136)   potassium chloride 10 mEq (04/06/23 1126)     LOS: 3 days    Time spent: 35 min  Alwyn Ren, MD  04/06/2023, 12:44 PM

## 2023-04-06 NOTE — Progress Notes (Signed)
 Wife Bjorn Loser) of patient called and wanted a call back from MD regarding pt condition and results of CT scan. Bjorn Loser stated that she wants to talk to doctor in person, but concern that husband will get worked up like yesterday and want to go home. Patient CIWA scores between 18-20 requiring Ativan. Due to drowsiness unable to give oral medications. Patient b/p elevated requiring prn hydralazine during the shift. Will continue to monitor.

## 2023-04-06 NOTE — Progress Notes (Signed)
   04/06/23 2200  BiPAP/CPAP/SIPAP  BiPAP/CPAP/SIPAP Pt Type Adult  Reason BIPAP/CPAP not in use Non-compliant

## 2023-04-07 ENCOUNTER — Inpatient Hospital Stay (HOSPITAL_COMMUNITY)

## 2023-04-07 DIAGNOSIS — R509 Fever, unspecified: Secondary | ICD-10-CM

## 2023-04-07 DIAGNOSIS — J449 Chronic obstructive pulmonary disease, unspecified: Secondary | ICD-10-CM

## 2023-04-07 DIAGNOSIS — A419 Sepsis, unspecified organism: Secondary | ICD-10-CM | POA: Diagnosis not present

## 2023-04-07 DIAGNOSIS — J9601 Acute respiratory failure with hypoxia: Secondary | ICD-10-CM

## 2023-04-07 DIAGNOSIS — N179 Acute kidney failure, unspecified: Secondary | ICD-10-CM | POA: Diagnosis not present

## 2023-04-07 DIAGNOSIS — G9341 Metabolic encephalopathy: Secondary | ICD-10-CM | POA: Diagnosis not present

## 2023-04-07 DIAGNOSIS — F1010 Alcohol abuse, uncomplicated: Secondary | ICD-10-CM

## 2023-04-07 DIAGNOSIS — K709 Alcoholic liver disease, unspecified: Secondary | ICD-10-CM | POA: Diagnosis not present

## 2023-04-07 DIAGNOSIS — J189 Pneumonia, unspecified organism: Secondary | ICD-10-CM | POA: Diagnosis not present

## 2023-04-07 LAB — CBC
HCT: 43.4 % (ref 39.0–52.0)
Hemoglobin: 13.1 g/dL (ref 13.0–17.0)
MCH: 30.4 pg (ref 26.0–34.0)
MCHC: 30.2 g/dL (ref 30.0–36.0)
MCV: 100.7 fL — ABNORMAL HIGH (ref 80.0–100.0)
Platelets: 363 10*3/uL (ref 150–400)
RBC: 4.31 MIL/uL (ref 4.22–5.81)
RDW: 18.5 % — ABNORMAL HIGH (ref 11.5–15.5)
WBC: 9.8 10*3/uL (ref 4.0–10.5)
nRBC: 0.3 % — ABNORMAL HIGH (ref 0.0–0.2)

## 2023-04-07 LAB — COMPREHENSIVE METABOLIC PANEL
ALT: 9 U/L (ref 0–44)
AST: 26 U/L (ref 15–41)
Albumin: 2.2 g/dL — ABNORMAL LOW (ref 3.5–5.0)
Alkaline Phosphatase: 72 U/L (ref 38–126)
Anion gap: 15 (ref 5–15)
BUN: 43 mg/dL — ABNORMAL HIGH (ref 8–23)
CO2: 23 mmol/L (ref 22–32)
Calcium: 12.2 mg/dL — ABNORMAL HIGH (ref 8.9–10.3)
Chloride: 107 mmol/L (ref 98–111)
Creatinine, Ser: 2.37 mg/dL — ABNORMAL HIGH (ref 0.61–1.24)
GFR, Estimated: 30 mL/min — ABNORMAL LOW (ref >60)
Glucose, Bld: 120 mg/dL — ABNORMAL HIGH (ref 70–99)
Potassium: 3.2 mmol/L — ABNORMAL LOW (ref 3.5–5.1)
Sodium: 145 mmol/L (ref 135–145)
Total Bilirubin: 1.1 mg/dL (ref 0.0–1.2)
Total Protein: 11.2 g/dL — ABNORMAL HIGH (ref 6.5–8.1)

## 2023-04-07 LAB — RAPID URINE DRUG SCREEN, HOSP PERFORMED
Amphetamines: NOT DETECTED
Barbiturates: NOT DETECTED
Benzodiazepines: POSITIVE — AB
Cocaine: NOT DETECTED
Opiates: NOT DETECTED
Tetrahydrocannabinol: NOT DETECTED

## 2023-04-07 LAB — BLOOD GAS, ARTERIAL
Acid-Base Excess: 10.2 mmol/L — ABNORMAL HIGH (ref 0.0–2.0)
Bicarbonate: 33.1 mmol/L — ABNORMAL HIGH (ref 20.0–28.0)
Drawn by: 25770
O2 Saturation: 98.5 %
Patient temperature: 39.4
pCO2 arterial: 41 mmHg (ref 32–48)
pH, Arterial: 7.52 — ABNORMAL HIGH (ref 7.35–7.45)
pO2, Arterial: 101 mmHg (ref 83–108)

## 2023-04-07 LAB — BRAIN NATRIURETIC PEPTIDE: B Natriuretic Peptide: 275.6 pg/mL — ABNORMAL HIGH (ref 0.0–100.0)

## 2023-04-07 LAB — GLUCOSE, CAPILLARY
Glucose-Capillary: 118 mg/dL — ABNORMAL HIGH (ref 70–99)
Glucose-Capillary: 127 mg/dL — ABNORMAL HIGH (ref 70–99)

## 2023-04-07 LAB — LACTIC ACID, PLASMA
Lactic Acid, Venous: 2 mmol/L (ref 0.5–1.9)
Lactic Acid, Venous: 2.4 mmol/L (ref 0.5–1.9)

## 2023-04-07 LAB — FOLATE: Folate: >40 ng/mL (ref >5.9)

## 2023-04-07 LAB — PROCALCITONIN: Procalcitonin: 1.34 ng/mL

## 2023-04-07 LAB — AMMONIA: Ammonia: 67 umol/L — ABNORMAL HIGH (ref 9–35)

## 2023-04-07 MED ORDER — FUROSEMIDE 10 MG/ML IJ SOLN
40.0000 mg | Freq: Four times a day (QID) | INTRAMUSCULAR | Status: DC
  Administered 2023-04-07: 40 mg via INTRAVENOUS
  Filled 2023-04-07: qty 4

## 2023-04-07 MED ORDER — LACTATED RINGERS IV BOLUS
250.0000 mL | Freq: Once | INTRAVENOUS | Status: AC
  Administered 2023-04-07: 250 mL via INTRAVENOUS

## 2023-04-07 MED ORDER — LACTULOSE 10 GM/15ML PO SOLN
30.0000 g | Freq: Two times a day (BID) | ORAL | Status: DC
  Administered 2023-04-07 – 2023-04-18 (×17): 30 g
  Filled 2023-04-07 (×19): qty 45

## 2023-04-07 MED ORDER — ACETAMINOPHEN 325 MG PO TABS
650.0000 mg | ORAL_TABLET | Freq: Four times a day (QID) | ORAL | Status: DC | PRN
  Administered 2023-04-12 – 2023-04-19 (×5): 650 mg
  Filled 2023-04-07 (×6): qty 2

## 2023-04-07 MED ORDER — LACTATED RINGERS IV SOLN: INTRAVENOUS | Status: DC

## 2023-04-07 MED ORDER — ACETAMINOPHEN 650 MG RE SUPP: 650.0000 mg | Freq: Four times a day (QID) | RECTAL | Status: DC | PRN

## 2023-04-07 MED ORDER — ARFORMOTEROL TARTRATE 15 MCG/2ML IN NEBU
15.0000 ug | INHALATION_SOLUTION | Freq: Two times a day (BID) | RESPIRATORY_TRACT | Status: DC
  Administered 2023-04-07 – 2023-04-21 (×28): 15 ug via RESPIRATORY_TRACT
  Filled 2023-04-07 (×29): qty 2

## 2023-04-07 MED ORDER — ADULT MULTIVITAMIN LIQUID CH
15.0000 mL | Freq: Every day | ORAL | Status: DC
  Administered 2023-04-08 – 2023-04-20 (×12): 15 mL
  Filled 2023-04-07 (×12): qty 15

## 2023-04-07 MED ORDER — ALBUMIN HUMAN 25 % IV SOLN
12.5000 g | Freq: Once | INTRAVENOUS | Status: AC
  Administered 2023-04-07: 12.5 g via INTRAVENOUS
  Filled 2023-04-07: qty 50

## 2023-04-07 MED ORDER — LACTATED RINGERS IV BOLUS: 250.0000 mL | Freq: Once | INTRAVENOUS | Status: DC

## 2023-04-07 MED ORDER — ONDANSETRON HCL 4 MG/2ML IJ SOLN
4.0000 mg | Freq: Four times a day (QID) | INTRAMUSCULAR | Status: DC | PRN
Start: 2023-04-07 — End: 2023-04-13

## 2023-04-07 MED ORDER — METOPROLOL TARTRATE 25 MG/10 ML ORAL SUSPENSION
25.0000 mg | Freq: Two times a day (BID) | ORAL | Status: DC
  Administered 2023-04-07 – 2023-04-08 (×2): 25 mg
  Filled 2023-04-07 (×2): qty 10

## 2023-04-07 MED ORDER — PIPERACILLIN-TAZOBACTAM 3.375 G IVPB
3.3750 g | Freq: Three times a day (TID) | INTRAVENOUS | Status: DC
  Administered 2023-04-07 – 2023-04-08 (×3): 3.375 g via INTRAVENOUS
  Filled 2023-04-07 (×4): qty 50

## 2023-04-07 MED ORDER — FOLIC ACID 1 MG PO TABS
1.0000 mg | ORAL_TABLET | Freq: Every day | ORAL | Status: DC
  Administered 2023-04-08 – 2023-04-20 (×12): 1 mg
  Filled 2023-04-07 (×12): qty 1

## 2023-04-07 MED ORDER — GUAIFENESIN 100 MG/5ML PO LIQD
15.0000 mL | Freq: Four times a day (QID) | ORAL | Status: DC
  Administered 2023-04-07 – 2023-04-08 (×3): 15 mL
  Filled 2023-04-07 (×4): qty 20

## 2023-04-07 MED ORDER — ONDANSETRON HCL 4 MG PO TABS: 4.0000 mg | ORAL_TABLET | Freq: Four times a day (QID) | ORAL | Status: DC | PRN

## 2023-04-07 MED ORDER — IOHEXOL 9 MG/ML PO SOLN
500.0000 mL | ORAL | Status: AC
Start: 2023-04-07 — End: 2023-04-07
  Administered 2023-04-07 (×2): 500 mL via ORAL

## 2023-04-07 MED ORDER — FAMOTIDINE 20 MG PO TABS
40.0000 mg | ORAL_TABLET | Freq: Every day | ORAL | Status: DC
  Administered 2023-04-08 – 2023-04-16 (×8): 40 mg
  Filled 2023-04-07 (×8): qty 2

## 2023-04-07 MED ORDER — METOPROLOL TARTRATE 5 MG/5ML IV SOLN
5.0000 mg | Freq: Four times a day (QID) | INTRAVENOUS | Status: AC | PRN
  Administered 2023-04-07 (×3): 5 mg via INTRAVENOUS
  Filled 2023-04-07 (×3): qty 5

## 2023-04-07 MED ORDER — LACTATED RINGERS IV BOLUS: 500.0000 mL | Freq: Once | INTRAVENOUS | Status: DC

## 2023-04-07 MED ORDER — TRAZODONE HCL 50 MG PO TABS
25.0000 mg | ORAL_TABLET | Freq: Every evening | ORAL | Status: DC | PRN
  Administered 2023-04-15 – 2023-04-16 (×2): 25 mg
  Filled 2023-04-07 (×2): qty 1

## 2023-04-07 MED ORDER — RIVAROXABAN 20 MG PO TABS
20.0000 mg | ORAL_TABLET | Freq: Every day | ORAL | Status: DC
  Administered 2023-04-07: 20 mg
  Filled 2023-04-07: qty 1

## 2023-04-07 MED ORDER — LORAZEPAM 1 MG PO TABS
1.0000 mg | ORAL_TABLET | ORAL | Status: AC | PRN
  Filled 2023-04-07: qty 1

## 2023-04-07 MED ORDER — REVEFENACIN 175 MCG/3ML IN SOLN
175.0000 ug | Freq: Every day | RESPIRATORY_TRACT | Status: DC
  Administered 2023-04-07 – 2023-04-21 (×15): 175 ug via RESPIRATORY_TRACT
  Filled 2023-04-07 (×14): qty 3

## 2023-04-07 MED ORDER — LORAZEPAM 2 MG/ML IJ SOLN
1.0000 mg | INTRAMUSCULAR | Status: AC | PRN
  Administered 2023-04-07: 2 mg via INTRAVENOUS
  Administered 2023-04-08: 1 mg via INTRAVENOUS
  Filled 2023-04-07 (×2): qty 1

## 2023-04-07 MED ORDER — BUDESONIDE 0.5 MG/2ML IN SUSP
0.5000 mg | Freq: Two times a day (BID) | RESPIRATORY_TRACT | Status: DC
  Administered 2023-04-07 – 2023-04-21 (×28): 0.5 mg via RESPIRATORY_TRACT
  Filled 2023-04-07 (×28): qty 2

## 2023-04-07 MED ORDER — IOHEXOL 9 MG/ML PO SOLN
ORAL | Status: AC
  Filled 2023-04-07: qty 1000

## 2023-04-07 MED ORDER — HYDRALAZINE HCL 50 MG PO TABS
50.0000 mg | ORAL_TABLET | Freq: Three times a day (TID) | ORAL | Status: DC
  Administered 2023-04-07 – 2023-04-08 (×2): 50 mg
  Filled 2023-04-07 (×4): qty 1

## 2023-04-07 NOTE — Consult Note (Addendum)
 NAME:  MARQUARIUS LOFTON, MRN:  [id number], DOB:  05-Nov-1960, LOS: 4 ADMISSION DATE:  04/03/2023, CONSULTATION DATE:  04/07/23 REFERRING MD:  Jerolyn Center CHIEF COMPLAINT:  AMS   History of Present Illness:  Ronnie Dunn is a 63 y.o. male who has a PMH as below including but not limited to COPD, OSA on CPAP, chronic diastolic CHF, HTN, HLD, PAF on Xarelto, tobacco dependence, alcohol use.  He is followed by [name] as an outpatient. He presented to [facility] ED on 3/17 with cough, fever/chills, nausea, weakness, fatigue, dyspnea x 2 weeks.  He had some confusion on and off and decreased appetite.  He had also complained of low back pain which was new and was not associated with any recent fall or trauma.  He had productive sputum, green in color.  He recently completed a course of azithromycin as an outpatient but had no relief with this.  Due to ongoing symptoms, he presented to the ED for further workup.  Initial chest x-ray showed bilateral interstitial opacities.  He was started on IV Lasix.  Renal function worsened, he had renal ultrasound on 3/19 that showed normal kidneys.  Follow-up chest x-ray showed cardiomegaly and ongoing interstitial opacities with small pleural effusions.  Diuresis was continued; however, renal function worsened further.  Was initially on vancomycin and cefepime however cefepime was stopped due to altered mental status.  This was switched to ceftriaxone on 3/19.  Overnight 3/20, he developed A-fib RVR.  He received Lopressor with improvement in heart rate.  Early a.m. 3/21 he had altered mental status and new fever to 102.9 rectally.  He was subsequently transferred to the ICU and PCCM and infectious disease were asked to see in consultation.  Upon arrival to the ICU, he is minimally responsive but is protecting his airway.  He has heart rates in the 110s to 120s, blood pressure stable.  Mucous membranes are very dry.  ABG was obtained and was normal (7.52/41/21). He has clear urine  in his Foley.  Antibiotics were escalated from ceftriaxone to Zosyn.  Infectious disease has seen him and we have agreed to assess CT chest/abdomen/pelvis.  Pertinent  Medical History:  has Seizure (HCC); Hyponatremia; Alcohol use disorder, moderate, dependence (HCC); Class 1 obesity; Essential hypertension; Type 2 diabetes mellitus without complication, without long-term current use of insulin (HCC); Chronic obstructive pulmonary disease (HCC); Gastroesophageal reflux disease; Lower extremity edema; Permanent atrial fibrillation (HCC); OSA (obstructive sleep apnea); Chronic heart failure with preserved ejection fraction (HCC); Seizures (HCC); Protein-calorie malnutrition, moderate (HCC); Hypomagnesemia; Alcohol abuse; Obesity (BMI 30-39.9); Noncompliance with medication regimen; Aortic root dilation (HCC); Acquired thrombophilia (HCC); and Sepsis due to pneumonia [facility]) on their problem list.  Significant Hospital Events: Including procedures, antibiotic start and stop dates in addition to other pertinent events   3/17 admit  Interim History / Subjective:  Altered but protecting airway. Vitals stable.  Objective:  Blood pressure 136/80, pulse 86, temperature (!) 102.9 F (39.4 C), temperature source Rectal, resp. rate (!) 27, height 6\' 2"  (1.88 m), weight 103.9 kg, SpO2 96%.        Intake/Output Summary (Last 24 hours) at 04/07/2023 1138 Last data filed at 04/07/2023 1130 Gross per 24 hour  Intake 812.12 ml  Output 730 ml  Net 82.12 ml   Filed Weights   04/05/23 0500 04/06/23 0429 04/07/23 0500  Weight: 104.4 kg 105 kg 103.9 kg    Examination: General: Adult male, chronically ill appearing. Neuro: Altered, minimally responsive but is protecting  airway. Does not follow commands. HEENT: [place]/AT. Sclerae anicteric. MM dry. Cardiovascular: Tachy, Caren Hazy, no M/R/G.  Lungs: Respirations even and unlabored.  CTA bilaterally, No W/R/R. Abdomen: Obese. BS x 4, soft, NT/ND.  Musculoskeletal: No  gross deformities, no edema.  Skin: Intact, warm, no rashes.   Labs/imaging personally reviewed:  CT head 3/19 > neg. Renal US 3/19 > neg. CT chest/abd/pelv 3/21 >   Assessment & Plan:   Acute metabolic encephalopathy - multifactorial 2/2 hyperammonemia, EtOH, presumed early developing sepsis of unclear etiology. CT head, TSH, ABG normal. Folate elevated, B12 pending. - Supportive care as below. - Place cortrak and switch Lactulose to per NGT. - D/c rectal Lactulose. - Switch abx from Ceftriaxone to Zosyn. - ID on board. - F/u on CT chest/abd/pelv.  Sepsis of unclear etiology - some suspicion for early aspiration PNA. Some congestion vs infiltrate on CXR, CT pending. - Change abx as above, switch from Ceftriaxone to Zosyn. - Follow cultures. - Add RVP. - F/u CT chest/abd/pelv. - F/u lactic acid, PCT. - Consider repeat echo.  Acute hypoxic respiratory failure - 2/2 above + baseline COPD (though not on O2), OSA. Hx COPD, OSA, tobacco dependence. - Continue supplemental O2 as needed to maintain SpO2 > 92%. - No need for BiPAP or intubation currently. - CPAP nocturnally. - Triple therapy nebs in lieu of PTA Trelegy. - F/u on BNP. - Tobacco cessation counseling.  AKI. Hypokalemia - s/p repletion. - Hold diuresis for now. - Gentle fluids. - F/u BNP. - Follow BMP.  Hx EtOH dependence. - Ativan per CIWA, holding for now given AMS.  Hx dCHF, A.fib on Xarelto, HTN, HLD. - Hold diuresis for now, appears a bit dry on exam. - F/u on BNP. - Hold PO antihypertensives for now. - Continue Xarelto. - Consider repeat echo.   Best practice (evaluated daily):  Diet/type: NPO DVT prophylaxis: DOAC Pressure ulcer(s): pressure ulcer assessment deferred  GI prophylaxis: N/A Lines: N/A Foley:  N/A Code Status:  full code Last date of multidisciplinary goals of care discussion: None yet.  Labs   CBC: Recent Labs  Lab 04/03/23 1118 04/04/23 0244 04/05/23 0735  WBC 8.1 7.1  7.9  NEUTROABS 5.9  --   --   HGB 11.8* 11.2* 11.8*  HCT 38.7* 36.5* 37.7*  MCV 100.8* 100.0 98.4  PLT 337 318 346    Basic Metabolic Panel: Recent Labs  Lab 04/04/23 0244 04/05/23 0735 04/06/23 0515 04/06/23 0948 04/07/23 0508  NA 135 136 137 142 145  K 2.4* 2.8* 2.6* 2.8* 3.2*  CL 93* 96* 96* 100 107  CO2 30 28 29 25 23   GLUCOSE 96 108* 128* 120* 120*  BUN 21 30* 32* 33* 43*  CREATININE 1.81* 1.97* 1.99* 2.10* 2.37*  CALCIUM 11.2* 11.8* 11.8* 12.3* 12.2*  MG 1.3* 1.9  --   --   --   PHOS  --  2.0*  --  3.9  --    GFR: Estimated Creatinine Clearance: 41 mL/min (A) (by C-G formula based on SCr of 2.37 mg/dL (H)). Recent Labs  Lab 04/03/23 1111 04/03/23 1118 04/03/23 1432 04/04/23 0244 04/05/23 0735  WBC  --  8.1  --  7.1 7.9  LATICACIDVEN 1.7  --  1.1  --   --     Liver Function Tests: Recent Labs  Lab 04/03/23 1118 04/06/23 0948 04/07/23 0508  AST 18 27 26   ALT 5 5 9   ALKPHOS 70 70 72  BILITOT 0.8 0.8 1.1  PROT  10.7* 11.1* 11.2*  ALBUMIN 2.1* 2.0* 2.2*   Recent Labs  Lab 04/03/23 1118  LIPASE 27   Recent Labs  Lab 04/03/23 1118 04/06/23 0515 04/07/23 0601  AMMONIA 24 55* 67*    ABG    Component Value Date/Time   PHART 7.52 (H) 04/07/2023 1026   PCO2ART 41 04/07/2023 1026   PO2ART 101 04/07/2023 1026   HCO3 33.1 (H) 04/07/2023 1026   TCO2 22 04/05/2022 0652   ACIDBASEDEF 2.0 03/28/2020 1222   O2SAT 98.5 04/07/2023 1026     Coagulation Profile: Recent Labs  Lab 04/03/23 1118  INR 1.9*    Cardiac Enzymes: No results for input(s): "CKTOTAL", "CKMB", "CKMBINDEX", "TROPONINI" in the last 168 hours.  HbA1C: Hgb A1c MFr Bld  Date/Time Value Ref Range Status  06/23/2021 05:07 AM 7.2 (H) 4.8 - 5.6 % Final    Comment:    (NOTE) Pre diabetes:          5.7%-6.4%  Diabetes:              >6.4%  Glycemic control for   <7.0% adults with diabetes     CBG: Recent Labs  Lab 04/03/23 1712 04/06/23 1514  GLUCAP 133* 119*     Review of Systems:   Unable to obtain as pt is encephalopathic.  Past Medical History:  He,  has a past medical history of Chronic heart failure with preserved ejection fraction (HCC) (06/01/2021), Chronic obstructive pulmonary disease (HCC) (05/28/2021), Class 1 obesity (03/29/2020), Essential hypertension (03/29/2020), Gastroesophageal reflux disease (05/28/2021), Hyponatremia (03/29/2020), OSA (obstructive sleep apnea) (06/01/2021), Permanent atrial fibrillation (HCC) (05/28/2021), Thoracic aortic aneurysm without rupture (HCC) (06/01/2021), and Type 2 diabetes mellitus without complication, without long-term current use of insulin (HCC) (05/28/2021).   Surgical History:   Past Surgical History:  Procedure Laterality Date   BIOPSY  04/05/2022   Procedure: BIOPSY;  Surgeon: Kathi Der, MD;  Location: [facility];  Service: Gastroenterology;;   COLONOSCOPY WITH PROPOFOL N/A 04/05/2022   Procedure: COLONOSCOPY WITH PROPOFOL;  Surgeon: Kathi Der, MD;  Location: [facility];  Service: Gastroenterology;  Laterality: N/A;   ESOPHAGOGASTRODUODENOSCOPY (EGD) WITH PROPOFOL N/A 04/05/2022   Procedure: ESOPHAGOGASTRODUODENOSCOPY (EGD) WITH PROPOFOL;  Surgeon: Kathi Der, MD;  Location: [facility];  Service: Gastroenterology;  Laterality: N/A;   POLYPECTOMY  04/05/2022   Procedure: POLYPECTOMY;  Surgeon: Kathi Der, MD;  Location: [facility];  Service: Gastroenterology;;     Social History:   reports that he has been smoking cigarettes. He started smoking about 47 years ago. He has a 23.6 pack-year smoking history. He has never used smokeless tobacco. He reports current alcohol use of about 20.0 standard drinks of alcohol per week. He reports that he does not currently use drugs.   Family History:  His family history includes Hypertension in an other family member.   Allergies No Known Allergies   Home Medications  Prior to Admission medications   Medication  Sig Start Date End Date Taking? Authorizing Provider  albuterol (VENTOLIN HFA) 108 (90 Base) MCG/ACT inhaler Inhale 2 puffs into the lungs every 4 (four) hours as needed for wheezing or shortness of breath. 04/21/21  Yes [provider]  allopurinol (ZYLOPRIM) 300 MG tablet Take 300 mg by mouth daily as needed (gout). 08/02/22  Yes [provider]  atorvastatin (LIPITOR) 10 MG tablet Take 1 tablet (10 mg total) by mouth daily. 09/06/21  Yes Abonza, Maritza, PA-C  B Complex-C (SUPER B COMPLEX PO) Take 1 tablet by mouth daily.  Yes [provider]  cetirizine (ZYRTEC) 10 MG tablet Take 10 mg by mouth daily. 02/14/23  Yes [provider]  Cholecalciferol (VITAMIN D3) 50 MCG (2000 UT) TABS Take 2,000 Units by mouth daily at 6 (six) AM.   Yes [provider]  colchicine 0.6 MG tablet Take 1 tablet (0.6 mg total) by mouth 2 (two) times daily. Patient taking differently: Take 0.6 mg by mouth as needed (gout). 03/15/22 04/04/23 Yes Dyann Kief, PA-C  dapagliflozin propanediol (FARXIGA) 10 MG TABS tablet Take 1 tablet (10 mg total) by mouth daily before breakfast. 06/01/21  Yes Chandrasekhar, Mahesh A, MD  famotidine (PEPCID) 40 MG tablet Take 40 mg by mouth daily.   Yes [provider]  Fluticasone-Umeclidin-Vilant (TRELEGY ELLIPTA) 100-62.5-25 MCG/ACT AEPB Inhale 1 puff into the lungs daily. 09/16/21  Yes Mayer Masker, PA-C  folic acid (FOLVITE) 1 MG tablet Take 1 tablet by mouth once daily 10/26/21  Yes Abonza, Maritza, PA-C  furosemide (LASIX) 80 MG tablet Take 40-120 mg by mouth daily as needed for fluid or edema.   Yes [provider]  hydrALAZINE (APRESOLINE) 50 MG tablet Take 1 tablet (50 mg total) by mouth 3 (three) times daily. 11/01/21  Yes Abonza, Maritza, PA-C  lisinopril (ZESTRIL) 5 MG tablet Take 1 tablet by mouth once daily 10/26/21  Yes Abonza, Maritza, PA-C  magnesium oxide (MAG-OX) 400 MG tablet Take 400 mg by mouth daily.   Yes  [provider]  metoprolol succinate (TOPROL-XL) 100 MG 24 hr tablet Take 100 mg by mouth daily. 05/10/21  Yes [provider]  Multiple Vitamins-Minerals (MULTIVITAMIN WITH MINERALS) tablet Take 1 tablet by mouth daily.   Yes [provider]  pantoprazole (PROTONIX) 40 MG tablet Take 1 tablet by mouth once daily Patient taking differently: Take 40 mg by mouth at bedtime. 10/26/21  Yes Abonza, Maritza, PA-C  rivaroxaban (XARELTO) 20 MG TABS tablet TAKE 1 TABLET BY MOUTH ONCE DAILY WITH SUPPER 02/23/23  Yes Chandrasekhar, Mahesh A, MD  sodium chloride (OCEAN) 0.65 % SOLN nasal spray Place 1 spray into both nostrils as needed for congestion.   Yes [provider]  Tetrahydrozoline-Zn Sulfate (EYE DROPS RELIEF OP) Place 1 drop into both eyes daily as needed (dry eyes).   Yes [provider]  traZODone (DESYREL) 100 MG tablet Take 100 mg by mouth at bedtime.   Yes [provider]  Turmeric (QC TUMERIC COMPLEX) 500 MG CAPS Take 500 mg by mouth daily at 6 (six) AM.   Yes [provider]  vitamin E 180 MG (400 UNITS) capsule Take 400 Units by mouth daily.   Yes [provider]  budesonide (PULMICORT) 0.5 MG/2ML nebulizer solution Take 0.5 mg by nebulization daily. Patient not taking: Reported on 04/04/2023 12/16/22   [provider]  spironolactone (ALDACTONE) 25 MG tablet Take 1 tablet (25 mg total) by mouth daily. 08/12/21   Mayer Masker, PA-C     Critical care time: 40 min.   Rutherford Guys, PA - [facility] For pager details, please see AMION or use Epic chat  After 1900, please call [facility] for cross coverage needs 04/07/2023, 11:38 AM

## 2023-04-07 NOTE — Progress Notes (Signed)
 Initial Nutrition Assessment  DOCUMENTATION CODES:   Not applicable  INTERVENTION:  - If unable to advance oral diet in the next 1-2 days, recommend tube feeds via Cortrak. Would recommend: Osmolite 1.5 at 60 ml/h (1440 ml per day) *Would recommend starting at 16mL/hr and advancing by 10mL Q8H  Prosource TF20 60 ml BID Provides 2320 kcal, 130 gm protein, 1097 ml free water daily  - Monitor magnesium, potassium, and phosphorus BID for at least 3 days, MD to replete as needed, as pt may be at risk for refeeding syndrome.  - FWF per CCM. Consider Q4H.  - Recommend continuing Multivitamin with minerals, folic acid, and thiamine for history of etoh use.  - Monitor weight trends.  - Will monitor for diet advancement.   NUTRITION DIAGNOSIS:   Inadequate oral intake related to inability to eat as evidenced by NPO status.  GOAL:   Patient will meet greater than or equal to 90% of their needs  MONITOR:   Diet advancement, Labs, Weight trends  REASON FOR ASSESSMENT:   Other (Comment) (Cortrak)    ASSESSMENT:   63 y.o. male who has a PMH significant for COPD, OSA on CPAP, CHF, HTN, HLD, alcohol use who presented with cough, fever/chills, nausea, weakness, fatigue, dyspnea x 2 weeks. Admitted for sepsis and acute metabolic encephalopathy.  3/17 Admit 3/21 Rapid called, patient with AMS, transferred to ICU; Cortrak placed  Patient sleeping at time of visit, wife at bedside. Wife reports patient's UBW to be around 265# and shares he has been trying to lose weight over the past few months.  Per EMR, patient weighed at 267# in October and weighed on admission at 245#. This is a 22# or 8% weight loss in 5.5 months, which is borderline significant for the time frame.   Wife reports patient was eating his typical 1-2 meals a day at home but notes they were much smaller portions due to him trying to lose weight. As an example wife reported patient previously would eat 2  cheeseburgers and fries at a meal but recently would only eat 3/4 of a cheeseburger and only a couple fries.   Was initially eating well at beginning of admission (50-75% of meals per RN documentation). However, had 0% of meals yesterday and RN today reporting patient not alert enough to safely swallow at this time.  CCM ordered Cortrak which was placed after visit. Per xray, tip in distal stomach or proximal duodenum.   Discussed patient with CCM PA. No plan to start tube feeds via Cortrak today. Hopeful patient may be able to wake up enough to eat. Will provide tube feed recs in case TF needs to be started over the weekend.   Admit weight: 245# Current weight: 229# I&O's: -3.4L   Medications reviewed and include: 1mg  folic acid, MVI, 100mg  thiamine  Labs reviewed:  K+ 3.2 Creatinine 2.37 HA1C 7.2 (in June 2023)   NUTRITION - FOCUSED PHYSICAL EXAM:  Flowsheet Row Most Recent Value  Orbital Region Moderate depletion  Upper Arm Region Mild depletion  Thoracic and Lumbar Region No depletion  Buccal Region Moderate depletion  Temple Region Severe depletion  Clavicle Bone Region Mild depletion  Clavicle and Acromion Bone Region Mild depletion  Scapular Bone Region Unable to assess  Dorsal Hand No depletion  Patellar Region Mild depletion  Anterior Thigh Region Mild depletion  Posterior Calf Region Moderate depletion  Edema (RD Assessment) None  Hair Reviewed  Eyes Unable to assess  Mouth Reviewed  Cydney Ok  2 teeth,  wears dentures per wife]  Skin Reviewed  Nails Reviewed       Diet Order:   Diet Order             Diet NPO time specified  Diet effective now                   EDUCATION NEEDS:  Education needs have been addressed  Skin:  Skin Assessment: Reviewed RN Assessment  Last BM:  3/20 - type 7  Height:  Ht Readings from Last 1 Encounters:  04/03/23 6\' 2"  (1.88 m)   Weight:  Wt Readings from Last 1 Encounters:  04/07/23 103.9 kg   Ideal Body  Weight:  86.36 kg  BMI:  Body mass index is 29.41 kg/m.  Estimated Nutritional Needs:  Kcal:  2300-2500 kcals Protein:  110-130 grams Fluid:  >/= 2.3L    Shelle Iron RD, LDN Contact via Secure Chat.

## 2023-04-07 NOTE — Progress Notes (Addendum)
    Patient Name: AWAD GLADD           DOB: Jun 13, 1960  MRN: [id number]      Admission Date: 04/03/2023  Attending Provider: Alwyn Ren, MD  Primary Diagnosis: Sepsis due to pneumonia [facility])   Level of care: Progressive    CROSS COVER NOTE   Date of Service   04/07/2023   BERNON ARVISO, 63 y.o. male, was admitted on 04/03/2023 for Sepsis due to pneumonia [facility]).    HPI/Events of Note   Atrial fibrillation with RVR, HR 120-140s. Hx PAF on p.o. metoprolol and Xarelto Initially tachycardia was thought to be due to EtOH withdrawal symptoms.  However patient is now resting and HR sustaining 140s. Will treat RVR with IV Lopressor.  RN to continue treating EtOH withdrawal symptoms with Ativan.   Addendum: Controlled after metoprolol, HR 90-100.   Interventions/ Plan   EKG PRN IV Lopressor        Anthoney Harada, DNP, ACNPC- AG [facility]

## 2023-04-07 NOTE — Progress Notes (Signed)
   04/06/23 2239  Assess: MEWS Score  Temp 99.5 F (37.5 C)  BP (!) 171/107  MAP (mmHg) 126  Pulse Rate (!) 134  Assess: if the MEWS score is Yellow or Red  Were vital signs accurate and taken at a resting state? Yes  Does the patient meet 2 or more of the SIRS criteria? Yes  Does the patient have a confirmed or suspected source of infection? Yes  MEWS guidelines implemented  No, previously red, continue vital signs every 4 hours  Notify: Charge Nurse/RN  Name of Charge Nurse/RN Notified [place], RN  Provider Notification  Provider Name/Title Anthoney Harada, NP  Date Provider Notified 04/06/23  Time Provider Notified 2220  Method of Notification Page  Notification Reason Other (Comment) (Ongoing Etoh withrawal)  Provider response See new orders  Date of Provider Response 04/06/23

## 2023-04-07 NOTE — Progress Notes (Signed)
 Pharmacy Antibiotic Note  Ronnie Dunn is a 63 y.o. male admitted on 04/03/2023 with pneumonia.  PMH is significant for COPD, CHF.  He had worsening symptoms despite recent outpatient Azithromycin.  He was initially treated with Cefepime and vancomycin and then narrowed to Ceftriaxone.  He was transferred to ICU on 3/21 and Pharmacy has been consulted for Zosyn dosing.  Plan: Zosyn 3.375g IV q8h (4 hour infusion). Pharmacy will sign off notes, but will continue to follow up peripherally for renal function, culture results, and clinical course.   Height: 6\' 2"  (188 cm) Weight: 103.9 kg (229 lb 0.9 oz) IBW/kg (Calculated) : 82.2  Temp (24hrs), Avg:100.2 F (37.9 C), Min:98.9 F (37.2 C), Max:102.9 F (39.4 C)  Recent Labs  Lab 04/03/23 1111 04/03/23 1118 04/03/23 1118 04/03/23 1432 04/04/23 0244 04/05/23 0735 04/06/23 0515 04/06/23 0948 04/07/23 0508  WBC  --  8.1  --   --  7.1 7.9  --   --   --   CREATININE  --  2.00*   < >  --  1.81* 1.97* 1.99* 2.10* 2.37*  LATICACIDVEN 1.7  --   --  1.1  --   --   --   --   --    < > = values in this interval not displayed.    Estimated Creatinine Clearance: 41 mL/min (A) (by C-G formula based on SCr of 2.37 mg/dL (H)).    No Known Allergies  Antimicrobials this admission: 3/17 Cefepime >> 3/19 3/17 Vancomycin >> 3/18 3/19 Ceftriaxone >> 3/21 3/21 Zosyn >>   Dose adjustments this admission:   Microbiology results: 3/17 Resp panel: neg covid, flu, rsv 3/17 MRSA PCR: not detected 3/17 BCx: ngtd 3/21 BCx:  3/21 MRSA PCR:   Thank you for allowing pharmacy to be a part of this patient's care.  Lynann Beaver PharmD, BCPS [facility] pharmacy [phone] 04/07/2023 10:33 AM

## 2023-04-07 NOTE — Progress Notes (Addendum)
 PROGRESS NOTE    Ronnie Dunn  NWG:[id number] DOB: November 23, 1960 DOA: 04/03/2023 PCP: [name], MD   Brief Narrative: 63 yo male  with past medical history significant for COPD, chronic diastolic congestive heart failure, HTN, HLD, paroxysmal atrial fibrillation on anticoagulation with Xarelto, GERD, OSA, tobacco use disorder, alcohol use disorder, obesity who presented to [facility] ED from home via EMS on 04/03/2022 with complaints of productive cough, fever/chills, nausea, progressive weakness/fatigue and shortness of breath over the last 2 weeks.  Spouse also noted that he was confused, not wanting to eat or drink anything.  Also reported to be hypothermic with temperature 94.0 F.  Also has had complaints of low back pain for past two weeks which is new,no falls reported    Sputum thick, green in appearance.  Recently completed a course of azithromycin prescribed by his pulmonologist, [name] which did not improve his symptoms.  Given progression, confusion spouse called EMS and patient was brought to the ED for further evaluation management.   In the ED, temperature 99.4 F, HR 65, RR 23, BP 145/90, SpO2 88% on room air.  WBC 8.1, hemoglobin 11.8, platelet count 337.  Sodium 134, potassium 2.6, chloride 89, CO2 33, glucose 113, BUN 20, creatinine 2.00.  Lipase 27, AST 18, ALT less than 5, total bilirubin 0.8.  Ammonia level 24.  BNP 706.8.  Lactic acid 1.7.  INR 1.9.  COVID/influenza/RSV PCR negative.  TSH 3.591.  Urinalysis unrevealing.  Chest x-ray with increased bilateral interstitial and diffuse patchy opacities likely pulmonary edema, blunting of the costophrenic angles which may represent small pleural effusions, noted cardiomegaly.  Patient was given 2 L LR bolus, started on vancomycin, cefepime and metronidazole by EDP.  TRH consulted for admission for further evaluation management of acute hypoxic respiratory failure likely secondary to community-acquired  pneumonia.  Assessment & Plan:   Principal Problem:   Sepsis due to pneumonia [facility]) Active Problems:   AKI (acute kidney injury) (HCC)   Acute metabolic encephalopathy  Acute hypoxic respiratory failure, POA Community-acquired pneumonia Patient presenting to ED with 2-week history of progressive weakness, fatigue, productive cough with green sputum despite outpatient Amoxil with azithromycin.  Spouse noted that patient was hypoxic satting 85% on room air at home, patient is not dependent on oxygen during the day he uses at night with the CPAP to sleep.  In the ER he was 88% on room air COVID RSV and influenza were negative temperature was 99.4 white count 8.1 and lactic acid 1.7.  Initially started on vancomycin and cefepime.  Vancomycin stopped as with MRSA PCR was negative, cefepime was stopped 04/05/2023 due to change in mental status, Rocephin started 04/05/2023.  Patient was transferred to stepdown on 3/21 due to respiratory distress A-fib RVR and hide temp of 102 rectally.  ABG was 7.5 2/41/101  continue supplemental oxygen to keep sats above 88%.     Hypokalemia -potassium 3.2 continue repletion.     COPD Not oxygen dependent at baseline.  Follows with pulmonology outpatient [name]. On Trelegy Ellipta at baseline -- Breo Ellipta 1 puff daily -- Incruse Ellipta 1 puff daily -- Albuterol neb every 2 hours as needed wheezing/shortness of breath -- Continue supplemental oxygen, maintain SpO2 greater than 88%   Low back pain for the last 2 weeks per wife MRI lumbar spine -lumbar spondylosis, moderate bilateral neuroforaminal stenosis at L5-S1, mild spinal canal stenosis, moderate right lateral recess stenosis at L4-L5  Metabolic encephalopathy likely multifactorial -hypoxic respiratory  failure/hyperammonia/EtOH /hypercalcemia- wife reports he is more confused which is not his baseline he is hallucinating UA on admission was negative for UTI However wife reports she saw some blood  at the tip of his penis Repeat UA showed glucose hemoglobin and proteinuria with rare bacteria. Renal ultrasound no hydro  CT head remote left cerebellar infarct TSH normal  Ammonia 67 up from 55 core track placed in stepdown for lactulose  Chronic diastolic congestive heart failure, decompensated Essential hypertension Home regimen includes metoprolol succinate 50 mg p.o. daily, hydralazine 50 mg p.o. 3 times daily, spironolactone 25 mg p.o. daily, lisinopril 5 mg p.o. daily, furosemide 120 mg p.o. daily.  Patient with elevated BNP 706.8 and with chest x-ray findings also concerning for pulmonary edema.-Chest x-ray shows fluid overload lasix currently on hold   Paroxysmal atrial fibrillation -- Metoprolol succinate 50 mg p.o. daily -- Continue Xarelto   GERD -- Protonix 40 mg p.o. daily   HLD -- Atorvastatin 10 mg p.o. daily   Tobacco use disorder EtOH use disorder Counsel need for complete cessation/abstinence. --Nicotine patch   Obesity, class I Body mass index is 31.53 kg/m.    OSA --Continue nocturnal CPAP   Moderate hypercalcemia -corrected calcium 13.6 and trending up  Unclear etiology ?malignancy?myeloma Zometa  3/20 Ordered yest vitamin D phosphorus PTH and ionized calcium -lab reports that they cannot process these labs today as he does not have serum in his blood?? Spep upep   DVT prophylaxis:  rivaroxaban (XARELTO) tablet 20 mg    Code Status: Full Code Family Communication: dw wife    Estimated body mass index is 29.41 kg/m as calculated from the following:   Height as of this encounter: 6\' 2"  (1.88 m).   Weight as of this encounter: 103.9 kg.  DVT prophylaxis:xarelto Code Status:full Family Communication:dw wife Disposition Plan:  Status is: Inpatient Remains inpatient appropriate because: acute illness   Consultants: none  Procedures:none  Antimicrobials:rocephin  Subjective: Patient had A-fib RVR overnight with fever got 10 mg of Ativan  and worsening creatinine and mental status moved him to the stepdown unit today  objective: Vitals:   04/07/23 1100 04/07/23 1200 04/07/23 1210 04/07/23 1300  BP: 136/80 (!) 163/100  (!) 154/101  Pulse: 86 (!) 136 (!) 115 (!) 102  Resp: (!) 27 (!) 29 19 (!) 23  Temp:  (!) 100.6 F (38.1 C)    TempSrc:  Rectal    SpO2: 96% 96% 95% 97%  Weight:      Height:        Intake/Output Summary (Last 24 hours) at 04/07/2023 1421 Last data filed at 04/07/2023 1200 Gross per 24 hour  Intake 948.85 ml  Output 730 ml  Net 218.85 ml   Filed Weights   04/05/23 0500 04/06/23 0429 04/07/23 0500  Weight: 104.4 kg 105 kg 103.9 kg    Examination:  General exam: Seen in mittens responds to pain Respiratory system: Rhonchi to auscultation.  Tachypneic cardiovascular system: Irregular tachycardic gastrointestinal system: Abdomen is nondistended, soft and nontender. No organomegaly or masses felt. Normal bowel sounds heard. Central nervous system: mittens in place Extremities: Edema  Data Reviewed: I have personally reviewed following labs and imaging studies  CBC: Recent Labs  Lab 04/03/23 1118 04/04/23 0244 04/05/23 0735  WBC 8.1 7.1 7.9  NEUTROABS 5.9  --   --   HGB 11.8* 11.2* 11.8*  HCT 38.7* 36.5* 37.7*  MCV 100.8* 100.0 98.4  PLT 337 318 346   Basic Metabolic Panel: Recent  Labs  Lab 04/04/23 0244 04/05/23 0735 04/06/23 0515 04/06/23 0948 04/07/23 0508  NA 135 136 137 142 145  K 2.4* 2.8* 2.6* 2.8* 3.2*  CL 93* 96* 96* 100 107  CO2 30 28 29 25 23   GLUCOSE 96 108* 128* 120* 120*  BUN 21 30* 32* 33* 43*  CREATININE 1.81* 1.97* 1.99* 2.10* 2.37*  CALCIUM 11.2* 11.8* 11.8* 12.3* 12.2*  MG 1.3* 1.9  --   --   --   PHOS  --  2.0*  --  3.9  --    GFR: Estimated Creatinine Clearance: 41 mL/min (A) (by C-G formula based on SCr of 2.37 mg/dL (H)). Liver Function Tests: Recent Labs  Lab 04/03/23 1118 04/06/23 0948 04/07/23 0508  AST 18 27 26   ALT 5 5 9   ALKPHOS 70 70  72  BILITOT 0.8 0.8 1.1  PROT 10.7* 11.1* 11.2*  ALBUMIN 2.1* 2.0* 2.2*   Recent Labs  Lab 04/03/23 1118  LIPASE 27   Recent Labs  Lab 04/03/23 1118 04/06/23 0515 04/07/23 0601  AMMONIA 24 55* 67*   Coagulation Profile: Recent Labs  Lab 04/03/23 1118  INR 1.9*   Cardiac Enzymes: No results for input(s): "CKTOTAL", "CKMB", "CKMBINDEX", "TROPONINI" in the last 168 hours. BNP (last 3 results) No results for input(s): "PROBNP" in the last 8760 hours. HbA1C: No results for input(s): "HGBA1C" in the last 72 hours. CBG: Recent Labs  Lab 04/03/23 1712 04/06/23 1514 04/07/23 1149  GLUCAP 133* 119* 118*   Lipid Profile: No results for input(s): "CHOL", "HDL", "LDLCALC", "TRIG", "CHOLHDL", "LDLDIRECT" in the last 72 hours. Thyroid Function Tests: No results for input(s): "TSH", "T4TOTAL", "FREET4", "T3FREE", "THYROIDAB" in the last 72 hours.  Anemia Panel: Recent Labs    04/06/23 2352  FOLATE >40.0   Sepsis Labs: Recent Labs  Lab 04/03/23 1111 04/03/23 1432 04/07/23 1315  PROCALCITON  --   --  1.34  LATICACIDVEN 1.7 1.1  --     Recent Results (from the past 240 hours)  Resp panel by RT-PCR (RSV, Flu A&B, Covid) Anterior Nasal Swab     Status: None   Collection Time: 04/03/23 11:18 AM   Specimen: Anterior Nasal Swab  Result Value Ref Range Status   SARS Coronavirus 2 by RT PCR NEGATIVE NEGATIVE Final    Comment: (NOTE) SARS-CoV-2 target nucleic acids are NOT DETECTED.  The SARS-CoV-2 RNA is generally detectable in upper respiratory specimens during the acute phase of infection. The lowest concentration of SARS-CoV-2 viral copies this assay can detect is 138 copies/mL. A negative result does not preclude SARS-Cov-2 infection and should not be used as the sole basis for treatment or other patient management decisions. A negative result may occur with  improper specimen collection/handling, submission of specimen other than nasopharyngeal swab, presence of  viral mutation(s) within the areas targeted by this assay, and inadequate number of viral copies(<138 copies/mL). A negative result must be combined with clinical observations, patient history, and epidemiological information. The expected result is Negative.  Fact Sheet for Patients:  BloggerCourse.com  Fact Sheet for Healthcare Providers:  SeriousBroker.it  This test is no t yet approved or cleared by the [place] FDA and  has been authorized for detection and/or diagnosis of SARS-CoV-2 by FDA under an Emergency Use Authorization (EUA). This EUA will remain  in effect (meaning this test can be used) for the duration of the COVID-19 declaration under Section 564(b)(1) of the Act, 21 U.S.C.section 360bbb-3(b)(1), unless the authorization is terminated  or revoked sooner.       Influenza A by PCR NEGATIVE NEGATIVE Final   Influenza B by PCR NEGATIVE NEGATIVE Final    Comment: (NOTE) The Xpert Xpress SARS-CoV-2/FLU/RSV plus assay is intended as an aid in the diagnosis of influenza from Nasopharyngeal swab specimens and should not be used as a sole basis for treatment. Nasal washings and aspirates are unacceptable for Xpert Xpress SARS-CoV-2/FLU/RSV testing.  Fact Sheet for Patients: BloggerCourse.com  Fact Sheet for Healthcare Providers: SeriousBroker.it  This test is not yet approved or cleared by the [place] FDA and has been authorized for detection and/or diagnosis of SARS-CoV-2 by FDA under an Emergency Use Authorization (EUA). This EUA will remain in effect (meaning this test can be used) for the duration of the COVID-19 declaration under Section 564(b)(1) of the Act, 21 U.S.C. section 360bbb-3(b)(1), unless the authorization is terminated or revoked.     Resp Syncytial Virus by PCR NEGATIVE NEGATIVE Final    Comment: (NOTE) Fact Sheet for  Patients: BloggerCourse.com  Fact Sheet for Healthcare Providers: SeriousBroker.it  This test is not yet approved or cleared by the [place] FDA and has been authorized for detection and/or diagnosis of SARS-CoV-2 by FDA under an Emergency Use Authorization (EUA). This EUA will remain in effect (meaning this test can be used) for the duration of the COVID-19 declaration under Section 564(b)(1) of the Act, 21 U.S.C. section 360bbb-3(b)(1), unless the authorization is terminated or revoked.  Performed at [facility], [address] [place], [place] [address]   Blood Culture (routine x 2)     Status: None (Preliminary result)   Collection Time: 04/03/23 11:18 AM   Specimen: BLOOD  Result Value Ref Range Status   Specimen Description   Final    BLOOD LEFT ANTECUBITAL Performed at [facility], [address], [place], [place] [address]    Special Requests   Final    BOTTLES DRAWN AEROBIC AND ANAEROBIC Blood Culture adequate volume Performed at [facility], [address] [place], [place] [address]    Culture   Final    NO GROWTH 4 DAYS Performed at [facility], [address] [place], [place] [address]    Report Status PENDING  Incomplete  Blood Culture (routine x 2)     Status: None (Preliminary result)   Collection Time: 04/03/23 11:18 AM   Specimen: BLOOD RIGHT WRIST  Result Value Ref Range Status   Specimen Description   Final    BLOOD RIGHT WRIST Performed at [facility], [address] [place], [place] [address]    Special Requests   Final    BOTTLES DRAWN AEROBIC AND ANAEROBIC Blood Culture adequate volume Performed at [facility], [address] [place], [place] [address]    Culture   Final    NO GROWTH 4 DAYS Performed at [facility], [address], [place], [place] [address]    Report Status PENDING   Incomplete  MRSA Next Gen by PCR, Nasal     Status: None   Collection Time: 04/03/23  5:17 PM   Specimen: Nasal Mucosa; Nasal Swab  Result Value Ref Range Status   MRSA by PCR Next Gen NOT DETECTED NOT DETECTED Final    Comment: (NOTE) The GeneXpert MRSA Assay (FDA approved for NASAL specimens only), is one component of a comprehensive MRSA colonization surveillance program. It is not intended to diagnose MRSA infection nor to  guide or monitor treatment for MRSA infections. Test performance is not FDA approved in patients less than 51 years old. Performed at [facility], [address], [place], [place] [address]          Radiology Studies: DG Chest 1 View Result Date: 04/07/2023 CLINICAL DATA:  Hypoxia. EXAM: CHEST  1 VIEW COMPARISON:  April 06, 2023. FINDINGS: Stable cardiomegaly. Right lung is clear. Minimal left basilar subsegmental atelectasis is noted. Bony thorax is unremarkable. IMPRESSION: Minimal left basilar subsegmental atelectasis. Electronically Signed   By: Lupita Raider M.D.   On: 04/07/2023 13:14   DG Abd Portable 1V Result Date: 04/07/2023 CLINICAL DATA:  NG tube placement EXAM: PORTABLE ABDOMEN - 1 VIEW COMPARISON:  None Available. FINDINGS: Limited x-ray for tube placement has feeding tube with tip extending to the right mid abdomen, possibly distal stomach or proximal duodenum. There are air-filled loops of distended bowel throughout the mid abdomen including some mildly dilated loops. Please correlate with history. Enlarged heart. IMPRESSION: Limited x-ray for tube placement has feeding tube extending to the right side of the abdomen, possibly distal stomach or proximal duodenum Electronically Signed   By: Karen Kays M.D.   On: 04/07/2023 13:00   DG Chest 1 View Result Date: 04/06/2023 CLINICAL DATA:  Hypoxia. EXAM: CHEST  1 VIEW COMPARISON:  Radiograph 04/03/2023 FINDINGS: The heart is enlarged. Increase in diffuse bilateral interstitial and  airspace opacities. Suspected small pleural effusions, left greater than right. No pneumothorax. IMPRESSION: 1. Cardiomegaly with diffuse bilateral interstitial and airspace opacities, increased from prior. Findings may represent pulmonary edema or multifocal pneumonia. 2. Suspected small pleural effusions, left greater than right. Electronically Signed   By: Narda Rutherford M.D.   On: 04/06/2023 18:45   US RENAL Result Date: 04/05/2023 CLINICAL DATA:  Abdominal pain. EXAM: RENAL / URINARY TRACT ULTRASOUND COMPLETE COMPARISON:  None Available. FINDINGS: Right Kidney: Renal measurements: 11.4 x 7.1 x 5 cm = volume: 210 mL. Normal parenchymal echogenicity. No hydronephrosis. No visualized stone or focal lesion. Left Kidney: Renal measurements: 14 x 7.6 x 5.6 cm = volume: 313 mL. Normal parenchymal echogenicity. No hydronephrosis. No visualized stone or focal lesion. Bladder: Not visualized.  The patient voided prior to the exam. Other: None. IMPRESSION: 1. Normal sonographic appearance of the kidneys. 2. Urinary bladder is decompressed and not assessed on the current exam. Electronically Signed   By: Narda Rutherford M.D.   On: 04/05/2023 19:45   CT HEAD WO CONTRAST ( ) Result Date: 04/05/2023 CLINICAL DATA:  Initial evaluation for mental status change, unknown cause. EXAM: CT HEAD WITHOUT CONTRAST TECHNIQUE: Contiguous axial images were obtained from the base of the skull through the vertex without intravenous contrast. RADIATION DOSE REDUCTION: This exam was performed according to the departmental dose-optimization program which includes automated exposure control, adjustment of the mA and/or kV according to patient size and/or use of iterative reconstruction technique. COMPARISON:  CT from 06/21/2021 FINDINGS: Brain: Cerebral volume within normal limits. 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 No acute intracranial hemorrhage. No acute  large vessel territory infarct. No mass lesion or midline shift. No hydrocephalus or extra-axial fluid collection. Vascular: No abnormal hyperdense vessel. Calcified atherosclerosis present at the skull base. Skull: Scalp soft tissues within normal limits.  Calvarium intact. Sinuses/Orbits: Globes and orbital soft tissues within normal limits. Paranasal sinuses are largely clear. No significant mastoid effusion. Other: None. IMPRESSION: 1. No acute intracranial abnormality. 2.  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 Electronically Signed   By: Rise Mu M.D.   On: 04/05/2023 19:27   MR Lumbar Spine W Wo Contrast Result Date: 04/05/2023 CLINICAL DATA:  Acute low back pain for the past 2 weeks. EXAM: MRI LUMBAR SPINE WITHOUT AND WITH CONTRAST TECHNIQUE: Multiplanar and multiecho pulse sequences of the lumbar spine were obtained without and with intravenous contrast. CONTRAST:  10mL GADAVIST GADOBUTROL 1 MMOL/ML IV SOLN COMPARISON:  None Available. FINDINGS: Segmentation: Assumed standard. The last well-formed disc space is designated L5-S1 for the purposes of this report. Alignment:  Physiologic. Vertebrae: No fracture or evidence of discitis. Decreased T2 and T1 marrow signal with diffuse low-level bone marrow increased STIR signal and enhancement. No focal bone lesion. Schmorl's node involving the right aspect of the L2 superior endplate. Conus medullaris and cauda equina: Conus extends to the L1 level. Conus and cauda equina appear normal. No abnormal intrathecal enhancement. Paraspinal and other soft tissues: Infrarenal abdominal aortic aneurysm measuring 3.9 cm. Disc levels: T12-L1:  Negative. L1-L2:  Minimal disc bulging.  No stenosis. L2-L3:  Negative disc.  Mild left facet arthropathy.  No stenosis. L3-L4: Mild disc bulging and bilateral facet arthropathy. Prominent posterior epidural fat. Mild spinal canal stenosis. No neuroforaminal stenosis.  L4-L5: Mild disc bulging with bulky right far lateral disc osteophyte complex. Mild bilateral facet arthropathy. Prominent posterior epidural fat. Mild spinal canal stenosis. Moderate right lateral recess stenosis. Mild right neuroforaminal stenosis. No left neuroforaminal stenosis. L5-S1: Disc bulging and endplate spurring asymmetric to the right. Prominent right foraminal disc protrusion. Mild bilateral facet arthropathy. Moderate bilateral neuroforaminal stenosis. No spinal canal stenosis. IMPRESSION: 1. Multilevel lumbar spondylosis as described above. Moderate bilateral neuroforaminal stenosis at L5-S1. 2. Mild spinal canal stenosis and moderate right lateral recess stenosis at L4-L5. 3. Diffuse low-level bone marrow increased STIR signal and enhancement, nonspecific but can be seen in the setting of anemia, smoking, obesity, or other hematologic disorders. 4. 3.9 cm infrarenal abdominal aortic aneurysm. Recommend follow-up ultrasound in 3 years. Electronically Signed   By: Obie Dredge M.D.   On: 04/05/2023 19:19   Scheduled Meds:  arformoterol  15 mcg Nebulization BID   budesonide (PULMICORT) nebulizer solution  0.5 mg Nebulization BID   Chlorhexidine Gluconate Cloth  6 each Topical Daily   famotidine  40 mg Oral Daily   folic acid  1 mg Oral Daily   guaiFENesin  600 mg Oral BID   hydrALAZINE  50 mg Oral TID   lactulose  30 g Per Tube BID   metoprolol succinate  50 mg Oral Daily   multivitamin with minerals  1 tablet Oral Daily   nicotine  21 mg Transdermal Daily   mouth rinse  15 mL Mouth Rinse 4 times per day   pantoprazole  40 mg Oral Daily   revefenacin  175 mcg Nebulization Daily   rivaroxaban  20 mg Oral Q supper   thiamine  100 mg Oral Daily   Or   thiamine  100 mg Intravenous Daily   Continuous Infusions:  piperacillin-tazobactam (ZOSYN)  IV 12.5 mL/hr at 04/07/23 1200     LOS: 4 days    Time spent: 35 min  Alwyn Ren, MD  04/07/2023, 2:21 PM

## 2023-04-07 NOTE — Procedures (Signed)
 CORTRAK TUBE INSERTION   A 10 F Cortrak tube has been placed. The tube was secured at the right nare at 85 cm by a member of the Cortrak tube team.  X-ray is required, abdominal x-ray has been ordered. Please confirm tube placement before using the Cortrak tube.   If the tube becomes dislodged please keep the tube and contact the Cortrak team at [phone] for replacement.  If after hours and replacement cannot be delayed, place a NG tube and confirm placement with an abdominal x-ray.  Donnita Falls, RN 04/07/2023 12:00 PM

## 2023-04-07 NOTE — Progress Notes (Signed)
 Red mews, called rapid. MD aware. Placed foley catheter, gave tylenol and hydralazine. Pt transferred to ICU.

## 2023-04-07 NOTE — Progress Notes (Signed)
Notified Lab that ABG being sent for analysis. 

## 2023-04-07 NOTE — TOC Progression Note (Signed)
 Transition of Care [facility]) - Progression Note    Patient Details  Name: Ronnie Dunn MRN: [id number] Date of Birth: 1960/02/02  Transition of Care [facility]) CM/SW Contact  Alistair Senft, Olegario Messier, RN Phone Number: 04/07/2023, 10:50 AM  Clinical Narrative:  Noted afib rvr transferred to ICU.Continue to monitor.     Expected Discharge Plan: Home w Home Health Services Barriers to Discharge: Continued Medical Work up  Expected Discharge Plan and Services   Discharge Planning Services: CM Consult Post Acute Care Choice: Home Health Living arrangements for the past 2 months: Single Family Home                           HH Arranged: PT HH Agency: [facility] Date [facility] Agency Contacted: 04/06/23 Time HH Agency Contacted: 1100 Representative spoke with at [facility] Agency: Amy   Social Determinants of Health (SDOH) Interventions SDOH Screenings   Food Insecurity: No Food Insecurity (04/03/2023)  Housing: Low Risk  (04/03/2023)  Transportation Needs: No Transportation Needs (04/03/2023)  Utilities: Not At Risk (04/03/2023)  Depression (PHQ2-9): Low Risk  (08/04/2021)  Recent Concern: Depression (PHQ2-9) - Medium Risk (05/28/2021)  Social Connections: Moderately Isolated (04/03/2023)  Tobacco Use: High Risk (04/03/2023)    Readmission Risk Interventions    04/06/2023   11:05 AM 06/23/2021   11:48 AM  Readmission Risk Prevention Plan  Transportation Screening Complete Complete  PCP or Specialist Appt within 5-7 Days  Complete  PCP or Specialist Appt within 3-5 Days Complete   Home Care Screening  Complete  Medication Review (RN CM)  Complete  HRI or Home Care Consult Complete   Social Work Consult for Recovery Care Planning/Counseling Complete   Palliative Care Screening Complete   Medication Review Oceanographer) Complete

## 2023-04-07 NOTE — Consult Note (Addendum)
 Date of Admission:  04/03/2023          Reason for Consult: Fevers, sepsis    Referring Provider: Blanchard Mane, MD   Assessment:  Fever, sepsis of unknown source--potentially due to aspiration in context of encephalopathy Alcoholic liver disease AKI Encephalopathy --presumably from alcohol withdrawal, hepatic encaphalopathy though no cirrhosis on CT at [facility] in 2019 COPD OSA AF w RVR  Plan:  Agree with change to zosyn Repeat blood cultures were taken but will not likely be revealing I have ordered a chest abdomen pelvis without contrast. Ensure screened for viral hepatides and HIV  I will follow-up and check on the patient tomorrow  Principal Problem:   Sepsis due to pneumonia [facility]) Active Problems:   AKI (acute kidney injury) (HCC)   Acute metabolic encephalopathy   Scheduled Meds:  arformoterol  15 mcg Nebulization BID   budesonide (PULMICORT) nebulizer solution  0.5 mg Nebulization BID   Chlorhexidine Gluconate Cloth  6 each Topical Daily   famotidine  40 mg Oral Daily   folic acid  1 mg Oral Daily   guaiFENesin  600 mg Oral BID   hydrALAZINE  50 mg Oral TID   iohexol  500 mL Oral Q1H   lactulose  30 g Per Tube BID   metoprolol succinate  50 mg Oral Daily   multivitamin with minerals  1 tablet Oral Daily   nicotine  21 mg Transdermal Daily   mouth rinse  15 mL Mouth Rinse 4 times per day   pantoprazole  40 mg Oral Daily   revefenacin  175 mcg Nebulization Daily   rivaroxaban  20 mg Oral Q supper   thiamine  100 mg Oral Daily   Or   thiamine  100 mg Intravenous Daily   Continuous Infusions:  albumin human     lactated ringers     lactated ringers     piperacillin-tazobactam (ZOSYN)  IV Stopped (04/07/23 1523)   PRN Meds:.acetaminophen **OR** acetaminophen, albuterol, haloperidol lactate, hydrALAZINE, LORazepam, LORazepam **OR** LORazepam, metoprolol tartrate, ondansetron **OR** ondansetron (ZOFRAN) IV, mouth rinse, sodium chloride,  traZODone  HPI: Ronnie Dunn is a 63 y.o. male with past medical history significant for COPD obstructive sleep apnea on CPAP CHF hypertension paroxysmal atrial fibrillation tobacco alcohol abuse who presented to the emerged department was along and was having team for cough fevers chills nausea and weakness.  He was experiencing some confusion waxed and waned and also had some decreased appetite he also apparently was complaining of some chronic low back pain.  Blood cultures were taken on admission which were negative and he was initially started on vancomycin cefepime and metronidazole.  Dust x-ray performed admission showed lateral interstitial diffuse patchy opacities with blunting of the costophrenic angles  He was ultimately narrowed to ceftriaxone  In the interim he had MRI of the lumbar spine with and without contrast which showed multilevel lumbar spondylosis and some mild spinal canal stenosis moderate right lateral recess stenosis L4-L5.  3.9 cm infrarenal abdominal aortic aneurysm was identified.  He also had CT of the head with contrast that day no intracranial abnormalities noted.  On the 20th he developed atrial fibrillation with rapid ventricular response.  He also then became profoundly confused and became febrile to 102.9 rectally.  In the ICU when I examined him he was minimally responsive and nonconversant.  He has been receiving lactulose actively via enema though not clear if this is causing him to have  bowel movements.  His ammonia level has been found to be quite high.ABG does not show hypercapnia.  Consulted to help in the workup of his fever and presumed septic picture.  I agree with broadening him to Zosyn.  I would think that aspiration pneumonia would be high on my list of causes for his acute febrile episode.  It is.  I still do not think he had meningitis or encephalitis as he would have presented with a headache and confusion that was more florid at  admission rather that developed during admission I suspect the confusion is likely largely being driven by alcohol withdrawal +/- hepatic encephalopathy along with some metabolic complications.  I have ordered a CT of the chest abdomen pelvis without contrast to investigate his lungs as well as abdomen.        Evaluation of the patient requires complex antimicrobial therapy evaluation, counseling , isolation needs to reduce disease transmission and risk assessment and mitigation.    CRITICAL CARE Performed by: Paulette Blanch Dam   Total critical care time: 32 minutes  Critical care time was exclusive of separately billable procedures and treating other patients.  Critical care was necessary to treat or prevent imminent or life-threatening deterioration.  Critical care was time spent personally by me on the following activities: development of treatment plan with patient and/or surrogate as well as nursing, discussions with consultants, evaluation of patient's response to treatment, examination of patient, obtaining history from patient or surrogate, ordering and performing treatments and interventions, ordering and review of laboratory studies, ordering and review of radiographic studies, pulse oximetry and re-evaluation of patient's condition.   Review of Systems: ROS  Past Medical History:  Diagnosis Date   Chronic heart failure with preserved ejection fraction (HCC) 06/01/2021   Chronic obstructive pulmonary disease (HCC) 05/28/2021   Class 1 obesity 03/29/2020   Essential hypertension 03/29/2020   Gastroesophageal reflux disease 05/28/2021   Hyponatremia 03/29/2020   OSA (obstructive sleep apnea) 06/01/2021   Permanent atrial fibrillation (HCC) 05/28/2021   Thoracic aortic aneurysm without rupture (HCC) 06/01/2021   Type 2 diabetes mellitus without complication, without long-term current use of insulin (HCC) 05/28/2021    Social History   Tobacco Use   Smoking status:  Every Day    Current packs/day: 0.50    Average packs/day: 0.5 packs/day for 47.2 years (23.6 ttl pk-yrs)    Types: Cigarettes    Start date: 1978   Smokeless tobacco: Never  Vaping Use   Vaping status: Never Used  Substance Use Topics   Alcohol use: Yes    Alcohol/week: 20.0 standard drinks of alcohol    Types: 20 Cans of beer per week   Drug use: Not Currently    Family History  Problem Relation Age of Onset   Hypertension Other    No Known Allergies  OBJECTIVE: Blood pressure (!) 154/101, pulse (!) 102, temperature (!) 100.6 F (38.1 C), temperature source Rectal, resp. rate (!) 23, height 6\' 2"  (1.88 m), weight 103.9 kg, SpO2 97%.  Physical Exam Constitutional:      Appearance: He is morbidly obese. He is ill-appearing.  HENT:     Head: Normocephalic and atraumatic.  Eyes:     General:        Right eye: No discharge.        Left eye: No discharge.  Cardiovascular:     Rate and Rhythm: Tachycardia present. Rhythm irregular.     Heart sounds: No murmur heard.    No friction  rub. No gallop.  Pulmonary:     Breath sounds: No wheezing or rhonchi.  Abdominal:     General: There is no distension.     Palpations: There is no mass.     Tenderness: There is no abdominal tenderness.  Neurological:     General: No focal deficit present.     Mental Status: He is lethargic.     Lab Results Lab Results  Component Value Date   WBC 9.8 04/07/2023   HGB 13.1 04/07/2023   HCT 43.4 04/07/2023   MCV 100.7 (H) 04/07/2023   PLT 363 04/07/2023    Lab Results  Component Value Date   CREATININE 2.37 (H) 04/07/2023   BUN 43 (H) 04/07/2023   NA 145 04/07/2023   K 3.2 (L) 04/07/2023   CL 107 04/07/2023   CO2 23 04/07/2023    Lab Results  Component Value Date   ALT 9 04/07/2023   AST 26 04/07/2023   ALKPHOS 72 04/07/2023   BILITOT 1.1 04/07/2023     Microbiology: Recent Results (from the past 240 hours)  Resp panel by RT-PCR (RSV, Flu A&B, Covid) Anterior Nasal  Swab     Status: None   Collection Time: 04/03/23 11:18 AM   Specimen: Anterior Nasal Swab  Result Value Ref Range Status   SARS Coronavirus 2 by RT PCR NEGATIVE NEGATIVE Final    Comment: (NOTE) SARS-CoV-2 target nucleic acids are NOT DETECTED.  The SARS-CoV-2 RNA is generally detectable in upper respiratory specimens during the acute phase of infection. The lowest concentration of SARS-CoV-2 viral copies this assay can detect is 138 copies/mL. A negative result does not preclude SARS-Cov-2 infection and should not be used as the sole basis for treatment or other patient management decisions. A negative result may occur with  improper specimen collection/handling, submission of specimen other than nasopharyngeal swab, presence of viral mutation(s) within the areas targeted by this assay, and inadequate number of viral copies(<138 copies/mL). A negative result must be combined with clinical observations, patient history, and epidemiological information. The expected result is Negative.  Fact Sheet for Patients:  BloggerCourse.com  Fact Sheet for Healthcare Providers:  SeriousBroker.it  This test is no t yet approved or cleared by the [place] FDA and  has been authorized for detection and/or diagnosis of SARS-CoV-2 by FDA under an Emergency Use Authorization (EUA). This EUA will remain  in effect (meaning this test can be used) for the duration of the COVID-19 declaration under Section 564(b)(1) of the Act, 21 U.S.C.section 360bbb-3(b)(1), unless the authorization is terminated  or revoked sooner.       Influenza A by PCR NEGATIVE NEGATIVE Final   Influenza B by PCR NEGATIVE NEGATIVE Final    Comment: (NOTE) The Xpert Xpress SARS-CoV-2/FLU/RSV plus assay is intended as an aid in the diagnosis of influenza from Nasopharyngeal swab specimens and should not be used as a sole basis for treatment. Nasal washings and aspirates  are unacceptable for Xpert Xpress SARS-CoV-2/FLU/RSV testing.  Fact Sheet for Patients: BloggerCourse.com  Fact Sheet for Healthcare Providers: SeriousBroker.it  This test is not yet approved or cleared by the [place] FDA and has been authorized for detection and/or diagnosis of SARS-CoV-2 by FDA under an Emergency Use Authorization (EUA). This EUA will remain in effect (meaning this test can be used) for the duration of the COVID-19 declaration under Section 564(b)(1) of the Act, 21 U.S.C. section 360bbb-3(b)(1), unless the authorization is terminated or revoked.     Resp Syncytial  Virus by PCR NEGATIVE NEGATIVE Final    Comment: (NOTE) Fact Sheet for Patients: BloggerCourse.com  Fact Sheet for Healthcare Providers: SeriousBroker.it  This test is not yet approved or cleared by the [place] FDA and has been authorized for detection and/or diagnosis of SARS-CoV-2 by FDA under an Emergency Use Authorization (EUA). This EUA will remain in effect (meaning this test can be used) for the duration of the COVID-19 declaration under Section 564(b)(1) of the Act, 21 U.S.C. section 360bbb-3(b)(1), unless the authorization is terminated or revoked.  Performed at [facility], [address] [place], [place] [address]   Blood Culture (routine x 2)     Status: None (Preliminary result)   Collection Time: 04/03/23 11:18 AM   Specimen: BLOOD  Result Value Ref Range Status   Specimen Description   Final    BLOOD LEFT ANTECUBITAL Performed at [facility], [address], [place], [place] [address]    Special Requests   Final    BOTTLES DRAWN AEROBIC AND ANAEROBIC Blood Culture adequate volume Performed at [facility], [address], [place], [place] [address]    Culture   Final    NO GROWTH 4 DAYS Performed at [facility], [address] [place], [place] [address]    Report Status PENDING  Incomplete  Blood Culture (routine x 2)     Status: None (Preliminary result)   Collection Time: 04/03/23 11:18 AM   Specimen: BLOOD RIGHT WRIST  Result Value Ref Range Status   Specimen Description   Final    BLOOD RIGHT WRIST Performed at [facility], [address] [place], [place] [address]    Special Requests   Final    BOTTLES DRAWN AEROBIC AND ANAEROBIC Blood Culture adequate volume Performed at [facility], [address] [place], [place] [address]    Culture   Final    NO GROWTH 4 DAYS Performed at [facility], [address], [place], [place] [address]    Report Status PENDING  Incomplete  MRSA Next Gen by PCR, Nasal     Status: None   Collection Time: 04/03/23  5:17 PM   Specimen: Nasal Mucosa; Nasal Swab  Result Value Ref Range Status   MRSA by PCR Next Gen NOT DETECTED NOT DETECTED Final    Comment: (NOTE) The GeneXpert MRSA Assay (FDA approved for NASAL specimens only), is one component of a comprehensive MRSA colonization surveillance program. It is not intended to diagnose MRSA infection nor to guide or monitor treatment for MRSA infections. Test performance is not FDA approved in patients less than 25 years old. Performed at [facility], [address] [place], [place] [address]     Acey Lav, MD [facility] [phone] pager  04/07/2023, 3:03 PM

## 2023-04-07 NOTE — Progress Notes (Signed)
 PT Cancellation Note  Patient Details Name: ROBERTSON COLCLOUGH MRN: [id number] DOB: 1960/09/05   Cancelled Treatment:    Reason Eval/Treat Not Completed: Other (comment) Pt transferred to ICU. Will hold PT treatment & f/u as able.  Aleda Grana, PT, DPT 04/07/23, 10:27 AM  Sandi Mariscal 04/07/2023, 10:27 AM

## 2023-04-08 ENCOUNTER — Inpatient Hospital Stay (HOSPITAL_COMMUNITY)

## 2023-04-08 ENCOUNTER — Encounter (HOSPITAL_COMMUNITY): Payer: Self-pay | Admitting: Internal Medicine

## 2023-04-08 DIAGNOSIS — R509 Fever, unspecified: Secondary | ICD-10-CM | POA: Diagnosis not present

## 2023-04-08 DIAGNOSIS — G9340 Encephalopathy, unspecified: Secondary | ICD-10-CM | POA: Diagnosis present

## 2023-04-08 DIAGNOSIS — J189 Pneumonia, unspecified organism: Secondary | ICD-10-CM | POA: Diagnosis not present

## 2023-04-08 DIAGNOSIS — R0603 Acute respiratory distress: Secondary | ICD-10-CM | POA: Diagnosis not present

## 2023-04-08 DIAGNOSIS — G9341 Metabolic encephalopathy: Secondary | ICD-10-CM | POA: Diagnosis not present

## 2023-04-08 DIAGNOSIS — N179 Acute kidney failure, unspecified: Secondary | ICD-10-CM

## 2023-04-08 DIAGNOSIS — K709 Alcoholic liver disease, unspecified: Secondary | ICD-10-CM | POA: Diagnosis not present

## 2023-04-08 DIAGNOSIS — C9000 Multiple myeloma not having achieved remission: Secondary | ICD-10-CM | POA: Diagnosis not present

## 2023-04-08 DIAGNOSIS — J9601 Acute respiratory failure with hypoxia: Secondary | ICD-10-CM | POA: Diagnosis not present

## 2023-04-08 DIAGNOSIS — F1010 Alcohol abuse, uncomplicated: Secondary | ICD-10-CM | POA: Diagnosis not present

## 2023-04-08 DIAGNOSIS — A419 Sepsis, unspecified organism: Secondary | ICD-10-CM | POA: Diagnosis not present

## 2023-04-08 LAB — ECHOCARDIOGRAM COMPLETE
AR max vel: 2.93 cm2
AV Area VTI: 3.07 cm2
AV Area mean vel: 2.71 cm2
AV Mean grad: 3 mmHg
AV Peak grad: 5.4 mmHg
Ao pk vel: 1.16 m/s
Area-P 1/2: 3.78 cm2
Height: 74 in
S' Lateral: 3.9 cm
Weight: 3742.53 [oz_av]

## 2023-04-08 LAB — PHOSPHORUS: Phosphorus: 2.5 mg/dL (ref 2.5–4.6)

## 2023-04-08 LAB — CULTURE, BLOOD (ROUTINE X 2)
Culture: NO GROWTH
Culture: NO GROWTH
Special Requests: ADEQUATE
Special Requests: ADEQUATE

## 2023-04-08 LAB — AMMONIA: Ammonia: 61 umol/L — ABNORMAL HIGH (ref 9–35)

## 2023-04-08 LAB — MAGNESIUM: Magnesium: 1.8 mg/dL (ref 1.7–2.4)

## 2023-04-08 LAB — CBC
HCT: 39.2 % (ref 39.0–52.0)
Hemoglobin: 12.2 g/dL — ABNORMAL LOW (ref 13.0–17.0)
MCH: 30.9 pg (ref 26.0–34.0)
MCHC: 31.1 g/dL (ref 30.0–36.0)
MCV: 99.2 fL (ref 80.0–100.0)
Platelets: 289 10*3/uL (ref 150–400)
RBC: 3.95 MIL/uL — ABNORMAL LOW (ref 4.22–5.81)
RDW: 18.2 % — ABNORMAL HIGH (ref 11.5–15.5)
WBC: 8.9 10*3/uL (ref 4.0–10.5)
nRBC: 0.2 % (ref 0.0–0.2)

## 2023-04-08 LAB — COMPREHENSIVE METABOLIC PANEL
ALT: 7 U/L (ref 0–44)
AST: 22 U/L (ref 15–41)
Albumin: 1.8 g/dL — ABNORMAL LOW (ref 3.5–5.0)
Alkaline Phosphatase: 77 U/L (ref 38–126)
Anion gap: 12 (ref 5–15)
BUN: 58 mg/dL — ABNORMAL HIGH (ref 8–23)
CO2: 26 mmol/L (ref 22–32)
Calcium: 11 mg/dL — ABNORMAL HIGH (ref 8.9–10.3)
Chloride: 108 mmol/L (ref 98–111)
Creatinine, Ser: 2.91 mg/dL — ABNORMAL HIGH (ref 0.61–1.24)
GFR, Estimated: 23 mL/min — ABNORMAL LOW (ref >60)
Glucose, Bld: 123 mg/dL — ABNORMAL HIGH (ref 70–99)
Potassium: 2.7 mmol/L — CL (ref 3.5–5.1)
Sodium: 146 mmol/L — ABNORMAL HIGH (ref 135–145)
Total Bilirubin: 1 mg/dL (ref 0.0–1.2)
Total Protein: 10.8 g/dL — ABNORMAL HIGH (ref 6.5–8.1)

## 2023-04-08 LAB — VITAMIN D 25 HYDROXY (VIT D DEFICIENCY, FRACTURES): Vit D, 25-Hydroxy: 73.8 ng/mL (ref 30–100)

## 2023-04-08 MED ORDER — METOPROLOL TARTRATE 25 MG/10 ML ORAL SUSPENSION
50.0000 mg | Freq: Two times a day (BID) | ORAL | Status: DC
  Administered 2023-04-09: 50 mg
  Filled 2023-04-08 (×2): qty 20

## 2023-04-08 MED ORDER — LACTATED RINGERS IV BOLUS
500.0000 mL | Freq: Once | INTRAVENOUS | Status: AC
  Administered 2023-04-09: 500 mL via INTRAVENOUS

## 2023-04-08 MED ORDER — LACTATED RINGERS IV BOLUS
1000.0000 mL | Freq: Once | INTRAVENOUS | Status: AC
  Administered 2023-04-08: 1000 mL via INTRAVENOUS

## 2023-04-08 MED ORDER — PANTOPRAZOLE SODIUM 40 MG IV SOLR
40.0000 mg | INTRAVENOUS | Status: DC
  Administered 2023-04-08 – 2023-04-20 (×13): 40 mg via INTRAVENOUS
  Filled 2023-04-08 (×13): qty 10

## 2023-04-08 MED ORDER — LACTATED RINGERS IV BOLUS
500.0000 mL | Freq: Once | INTRAVENOUS | Status: AC
  Administered 2023-04-08: 500 mL via INTRAVENOUS

## 2023-04-08 MED ORDER — METOPROLOL TARTRATE 25 MG/10 ML ORAL SUSPENSION
25.0000 mg | Freq: Once | ORAL | Status: AC
  Administered 2023-04-08: 25 mg
  Filled 2023-04-08: qty 10

## 2023-04-08 MED ORDER — DEXMEDETOMIDINE HCL IN NACL 200 MCG/50ML IV SOLN
0.0000 ug/kg/h | INTRAVENOUS | Status: DC
  Administered 2023-04-08: 0.7 ug/kg/h via INTRAVENOUS
  Administered 2023-04-08: 0.4 ug/kg/h via INTRAVENOUS
  Administered 2023-04-08: 0.6 ug/kg/h via INTRAVENOUS
  Administered 2023-04-08: 0.5 ug/kg/h via INTRAVENOUS
  Administered 2023-04-08: 0.7 ug/kg/h via INTRAVENOUS
  Administered 2023-04-09: 0.9 ug/kg/h via INTRAVENOUS
  Administered 2023-04-09: 0.6 ug/kg/h via INTRAVENOUS
  Administered 2023-04-09: 0.8 ug/kg/h via INTRAVENOUS
  Administered 2023-04-09: 0.6 ug/kg/h via INTRAVENOUS
  Administered 2023-04-09: 0.8 ug/kg/h via INTRAVENOUS
  Administered 2023-04-09: 0.4 ug/kg/h via INTRAVENOUS
  Administered 2023-04-10: 0.8 ug/kg/h via INTRAVENOUS
  Administered 2023-04-10 (×3): 0.9 ug/kg/h via INTRAVENOUS
  Filled 2023-04-08 (×5): qty 50
  Filled 2023-04-08: qty 100
  Filled 2023-04-08 (×7): qty 50
  Filled 2023-04-08: qty 100
  Filled 2023-04-08: qty 50

## 2023-04-08 MED ORDER — DEXTROSE 5 % IV SOLN: INTRAVENOUS | Status: AC

## 2023-04-08 MED ORDER — ACETAMINOPHEN 160 MG/5ML PO SOLN
650.0000 mg | Freq: Four times a day (QID) | ORAL | Status: DC | PRN
  Administered 2023-04-08 – 2023-04-20 (×9): 650 mg
  Filled 2023-04-08 (×9): qty 20.3

## 2023-04-08 MED ORDER — CALCITONIN (SALMON) 200 UNIT/ML IJ SOLN
400.0000 [IU] | Freq: Two times a day (BID) | INTRAMUSCULAR | Status: AC
  Administered 2023-04-08 (×2): 400 [IU] via SUBCUTANEOUS
  Filled 2023-04-08 (×2): qty 2

## 2023-04-08 MED ORDER — POTASSIUM CHLORIDE 10 MEQ/100ML IV SOLN
10.0000 meq | INTRAVENOUS | Status: AC
  Administered 2023-04-08 (×4): 10 meq via INTRAVENOUS
  Filled 2023-04-08 (×4): qty 100

## 2023-04-08 MED ORDER — RIVAROXABAN 15 MG PO TABS
15.0000 mg | ORAL_TABLET | Freq: Every day | ORAL | Status: DC
  Administered 2023-04-08 – 2023-04-09 (×2): 15 mg
  Filled 2023-04-08 (×2): qty 1

## 2023-04-08 NOTE — Progress Notes (Signed)
  Echocardiogram 2D Echocardiogram has been performed.  Ronnie Dunn Ronnie Dunn 04/08/2023, 1:57 PM

## 2023-04-08 NOTE — Progress Notes (Addendum)
 eLink Physician-Brief Progress Note Patient Name: Ronnie Dunn DOB: [birth year] MRN: [id number]   Date of Service  04/08/2023  HPI/Events of Note  63 year old male who initially presented with acute encephalopathy found to have acute kidney injury and multiple myeloma.  eICU Interventions  Responded to hypotension earlier in the day with fluid bolus. Repeat 500 cc bolus.  Net -600 cc for the stay so far.   2239 - Unable to do MRI of brain. Would not tolerate laying flat. Sats dropped, restlessness, and tachycardia.   Intervention Category Intermediate Interventions: Hypotension - evaluation and management  Marthe Dant 04/08/2023, 9:23 PM

## 2023-04-08 NOTE — Progress Notes (Signed)
   Per RN- calmer with pecedex but SBP lower sid  Plan  Flid bolus    SIGNATURE    Dr. Kalman Shan, M.D., F.C.C.P,  Pulmonary and Critical Care Medicine Staff Physician, [facility] Director - Interstitial Lung Disease  Program  Pulmonary Fibrosis [facility] [address] [place], [place], [address]   Pager: [phone], If no answer  -> Check AMION or Try [phone] Telephone (clinical office): [phone] Telephone (research): [phone]  3:39 PM 04/08/2023

## 2023-04-08 NOTE — Consult Note (Signed)
 Renal Service Consult Note [facility]  KARTHIKEYA Dunn 04/08/2023 Ronnie Krabbe, MD Requesting Physician: Dr. Jerolyn Center  Reason for Consult: Renal failure HPI: The patient is a 63 y.o. year-old w/ PMH as below who presented to ED from home for AMS and gen weakness for the last 4 days. Pt had been in bed all that time and was sick for 2 wks prior. Family reported dark urine. No etoh or drugs.  Patient admitted for community acquired pneumonia and acute hypoxic respiratory failure.  IV Vanco and cefepime were started.  Patient was hallucinating, also BNP was 700 and chest x-ray was showing vascular congestion possibly pulmonary edema.  He was started on IV Lasix 40 mg twice daily on 3/19.  Lisinopril was held.  Creatinine went up from 1.8 to 2.3 so the Lasix was held.  Calcium is high at 12.2 (peak here) and total protein was high.  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  Creatinine is 2.9 today. We are asked to see for renal failure.   Pt seen in ICU room.  Patient is confused and does not provide any history.   IP medications of interest: Calcitonin 400u bid dq, PPI, ensure/ enlive Lasix 80mg  x 1 on 3/17, 40mg  IV bid from 3/18 - 3/20, 40mg  IV once on 3/21 Hydralazine 50 tid, Lactulose, lorazepam, toprol xl (dc'd), metoprolol tartrate 5 mg IV yest, aldactone 25 every day on 3/18-3/19, zoledrenic acid 4 mg on 3/20 IV cefepime for 3 days then stopped IV Rocephin for 2 days and stopped Precedex drip currently ongoing  Zosyn IV 3.375 g every 8 hours ongoing IV vancomycin got 1 dose on 3/17 then DC'd IV hydralazine got 8 doses of 10 mg over 5 day stretch IV ativan for withdrawal SQ calcitonin 400 units bid started today Zometa 4mg  given on 3/20 No IV CT contrast   Vital signs-blood pressures have been high or normal since admission basically nothing under 130/80 I/o's = 6.5 L in and 8.8 L UOP = net neg 2.3 L   CXR on 3/17,  3/20, 3/21 - all showed CM +/- vasc congestion, don't see any edema  PMH Heart failure with preserved ejection fraction COPD Hypertension Hyponatremia Obstructive sleep apnea Permanent atrial fibrillation Thoracic aortic aneurysm without rupture Type 2 diabetes without long-term use of insulin   ROS - n/a   Past Medical History  Past Medical History:  Diagnosis Date   Chronic heart failure with preserved ejection fraction (HCC) 06/01/2021   Chronic obstructive pulmonary disease (HCC) 05/28/2021   Class 1 obesity 03/29/2020   Essential hypertension 03/29/2020   Gastroesophageal reflux disease 05/28/2021   Hyponatremia 03/29/2020   OSA (obstructive sleep apnea) 06/01/2021   Permanent atrial fibrillation (HCC) 05/28/2021   Thoracic aortic aneurysm without rupture (HCC) 06/01/2021   Type 2 diabetes mellitus without complication, without long-term current use of insulin (HCC) 05/28/2021   Past Surgical History  Past Surgical History:  Procedure Laterality Date   BIOPSY  04/05/2022   Procedure: BIOPSY;  Surgeon: Kathi Der, MD;  Location: [facility];  Service: Gastroenterology;;   COLONOSCOPY WITH PROPOFOL N/A 04/05/2022   Procedure: COLONOSCOPY WITH PROPOFOL;  Surgeon: Kathi Der, MD;  Location: [facility];  Service: Gastroenterology;  Laterality: N/A;   ESOPHAGOGASTRODUODENOSCOPY (EGD) WITH PROPOFOL N/A 04/05/2022   Procedure: ESOPHAGOGASTRODUODENOSCOPY (EGD) WITH PROPOFOL;  Surgeon: Kathi Der, MD;  Location: [facility];  Service: Gastroenterology;  Laterality: N/A;   POLYPECTOMY  04/05/2022  Procedure: POLYPECTOMY;  Surgeon: Kathi Der, MD;  Location: [facility];  Service: Gastroenterology;;   Family History  Family History  Problem Relation Age of Onset   Hypertension Other    Social History  reports that he has been smoking cigarettes. He started smoking about 47 years ago. He has a 23.6 pack-year smoking history. He has never used  smokeless tobacco. He reports current alcohol use of about 20.0 standard drinks of alcohol per week. He reports that he does not currently use drugs. Allergies No Known Allergies Home medications Prior to Admission medications   Medication Sig Start Date End Date Taking? Authorizing Provider  albuterol (VENTOLIN HFA) 108 (90 Base) MCG/ACT inhaler Inhale 2 puffs into the lungs every 4 (four) hours as needed for wheezing or shortness of breath. 04/21/21  Yes [provider]  allopurinol (ZYLOPRIM) 300 MG tablet Take 300 mg by mouth daily as needed (gout). 08/02/22  Yes [provider]  atorvastatin (LIPITOR) 10 MG tablet Take 1 tablet (10 mg total) by mouth daily. 09/06/21  Yes Abonza, Maritza, PA-C  B Complex-C (SUPER B COMPLEX PO) Take 1 tablet by mouth daily.   Yes [provider]  cetirizine (ZYRTEC) 10 MG tablet Take 10 mg by mouth daily. 02/14/23  Yes [provider]  Cholecalciferol (VITAMIN D3) 50 MCG (2000 UT) TABS Take 2,000 Units by mouth daily at 6 (six) AM.   Yes [provider]  colchicine 0.6 MG tablet Take 1 tablet (0.6 mg total) by mouth 2 (two) times daily. Patient taking differently: Take 0.6 mg by mouth as needed (gout). 03/15/22 04/04/23 Yes Dyann Kief, PA-C  dapagliflozin propanediol (FARXIGA) 10 MG TABS tablet Take 1 tablet (10 mg total) by mouth daily before breakfast. 06/01/21  Yes Chandrasekhar, Mahesh A, MD  famotidine (PEPCID) 40 MG tablet Take 40 mg by mouth daily.   Yes [provider]  Fluticasone-Umeclidin-Vilant (TRELEGY ELLIPTA) 100-62.5-25 MCG/ACT AEPB Inhale 1 puff into the lungs daily. 09/16/21  Yes Mayer Masker, PA-C  folic acid (FOLVITE) 1 MG tablet Take 1 tablet by mouth once daily 10/26/21  Yes Abonza, Maritza, PA-C  furosemide (LASIX) 80 MG tablet Take 40-120 mg by mouth daily as needed for fluid or edema.   Yes [provider]  hydrALAZINE (APRESOLINE) 50 MG tablet Take 1 tablet (50 mg total) by  mouth 3 (three) times daily. 11/01/21  Yes Abonza, Maritza, PA-C  lisinopril (ZESTRIL) 5 MG tablet Take 1 tablet by mouth once daily 10/26/21  Yes Abonza, Maritza, PA-C  magnesium oxide (MAG-OX) 400 MG tablet Take 400 mg by mouth daily.   Yes [provider]  metoprolol succinate (TOPROL-XL) 100 MG 24 hr tablet Take 100 mg by mouth daily. 05/10/21  Yes [provider]  Multiple Vitamins-Minerals (MULTIVITAMIN WITH MINERALS) tablet Take 1 tablet by mouth daily.   Yes [provider]  pantoprazole (PROTONIX) 40 MG tablet Take 1 tablet by mouth once daily Patient taking differently: Take 40 mg by mouth at bedtime. 10/26/21  Yes Abonza, Maritza, PA-C  rivaroxaban (XARELTO) 20 MG TABS tablet TAKE 1 TABLET BY MOUTH ONCE DAILY WITH SUPPER 02/23/23  Yes Chandrasekhar, Mahesh A, MD  sodium chloride (OCEAN) 0.65 % SOLN nasal spray Place 1 spray into both nostrils as needed for congestion.   Yes [provider]  Tetrahydrozoline-Zn Sulfate (EYE DROPS RELIEF OP) Place 1 drop into both eyes daily as needed (dry eyes).   Yes [provider]  traZODone (DESYREL) 100 MG tablet Take  100 mg by mouth at bedtime.   Yes [provider]  Turmeric (QC TUMERIC COMPLEX) 500 MG CAPS Take 500 mg by mouth daily at 6 (six) AM.   Yes [provider]  vitamin E 180 MG (400 UNITS) capsule Take 400 Units by mouth daily.   Yes [provider]  budesonide (PULMICORT) 0.5 MG/2ML nebulizer solution Take 0.5 mg by nebulization daily. Patient not taking: Reported on 04/04/2023 12/16/22   [provider]  spironolactone (ALDACTONE) 25 MG tablet Take 1 tablet (25 mg total) by mouth daily. 08/12/21   Mayer Masker, PA-C     Vitals:   04/08/23 1031 04/08/23 1033 04/08/23 1230 04/08/23 1243  BP: (!) 183/116 (!) 183/116  (!) 155/72  Pulse:  (!) 109  100  Resp:      Temp:   99.5 F (37.5 C)   TempSrc:   Oral   SpO2:      Weight:      Height:        Exam Gen stuporous, disheveled, not in distress, Tracy O2 HFNC 6 lpm  No rash, cyanosis or gangrene Poor skin turgor Sclera anicteric, throat clear  No jvd or bruits Chest clear bilat to bases, no rales/ wheezing RRR no MRG Abd soft obese ntnd no mass or ascites +bs GU normal male MS no joint effusions or deformity Ext no LE or UE edema, no other edema Neuro as above   Renal-related home meds: Lasix 40-120 mg daily as needed Lisinopril 5 mg daily Others: Pulmicort, Xarelto, Farxiga, colchicine, allopurinol, PRNs  Date   Creat  eGFR (ml/min) 2008   1.04- 1.97 2022   0.69- 0.98 May- aug 2023 0.77- 1.26 Mar 2022  1.27 June 2022  1.36 04/03/23  2.00   3/18   1.81 3/19   1.97 3/20   1.99 3/21   2.37 04/08/23  2.91    Renal ultrasound-11/14 cm kidneys without hydronephrosis Urinalysis 3/19: mod Hgb, prot 100, 0-5 WBCs, 11-20 RBC, rare bacteria Echo 3/22: LVEF 60-65%   Assessment/ Plan: AKI - b/l creat 1.1- 1.3 from mid 2024. Pt was diuresed w/ IV lasix 40-80mg  for about 3-4 days, net neg 2 L overall. BP's were high then normal, today there are low BP's prob due to sedation. Pt was hypercalcemic on presentation as well. UA unremarkable, renal US no obstruction. Suspect initial AKI due to hypercalcemia, 2nd rise may be from diuresing. Pt looks a bit dry. Agree w/ gentle IVF's/ LR. Will follow.  Hypernatremia - 3/19 urinalysis: Sodium up to 146 today, will add some D5W at 65 cc/hr. F/u labs in am.  Hypercalcemia - getting calcitonin, and sp Zometa, per oncology. 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  COPD HTN - on at least 3 bp lowering meds at home. Per pmd.  Atrial fib - on xarelto Tobacco/ etoh use disorder - in withdrawal per primary notes      Ronnie Moselle  MD [facility] 04/08/2023, 2:14 PM  Recent Labs  Lab 04/06/23 0948 04/07/23 0508 04/07/23 1251 04/08/23 0830  HGB  --   --  13.1 12.2*  ALBUMIN 2.0* 2.2*  --  1.8*  CALCIUM 12.3* 12.2*  --  11.0*  PHOS  3.9  --   --  2.5  CREATININE 2.10* 2.37*  --  2.91*  K 2.8* 3.2*  --  2.7*   Inpatient medications:  arformoterol  15 mcg Nebulization BID   budesonide (PULMICORT) nebulizer solution  0.5 mg Nebulization BID  calcitonin  400 Units Subcutaneous BID   Chlorhexidine Gluconate Cloth  6 each Topical Daily   famotidine  40 mg Per Tube Daily   folic acid  1 mg Per Tube Daily   hydrALAZINE  50 mg Per Tube TID   lactulose  30 g Per Tube BID   metoprolol tartrate  50 mg Per Tube BID   multivitamin  15 mL Per Tube Daily   nicotine  21 mg Transdermal Daily   mouth rinse  15 mL Mouth Rinse 4 times per day   pantoprazole (PROTONIX) IV  40 mg Intravenous Q24H   revefenacin  175 mcg Nebulization Daily   rivaroxaban  15 mg Per Tube Q supper   thiamine  100 mg Oral Daily   Or   thiamine  100 mg Intravenous Daily    dexmedetomidine (PRECEDEX) IV infusion 0.6 mcg/kg/hr (04/08/23 1353)   lactated ringers 50 mL/hr at 04/08/23 1353   potassium chloride 10 mEq (04/08/23 1354)   acetaminophen (TYLENOL) oral liquid 160 mg/5 mL, acetaminophen **OR** acetaminophen, albuterol, haloperidol lactate, hydrALAZINE, LORazepam, LORazepam **OR** LORazepam, metoprolol tartrate, ondansetron **OR** ondansetron (ZOFRAN) IV, mouth rinse, sodium chloride, traZODone

## 2023-04-08 NOTE — Plan of Care (Signed)
  Problem: Clinical Measurements: Goal: Will remain free from infection Outcome: Not Progressing Goal: Respiratory complications will improve Outcome: Not Progressing Goal: Cardiovascular complication will be avoided Outcome: Not Progressing   Problem: Activity: Goal: Risk for activity intolerance will decrease Outcome: Not Progressing   Problem: Coping: Goal: Level of anxiety will decrease Outcome: Not Progressing   Problem: Elimination: Goal: Will not experience complications related to bowel motility Outcome: Not Progressing Goal: Will not experience complications related to urinary retention Outcome: Not Progressing

## 2023-04-08 NOTE — Consult Note (Signed)
 Ronnie Dunn is a 63 year old white male.  Unfortunately, he is only not able to give me much history at all.  He was admitted on 04/03/2023.  He apparently was brought in because of pulmonary issues.  He had a cough.  He has a history of COPD.  His history of atrial fibrillation.  He came in brought in by his wife.  He was confused and weak.  He was somewhat hypothermic.  He does have a history of tobacco use and alcohol use.  When he came in, his white count was 8.1.  Hemoglobin 11.8.  Platelet count 337,000.  His sodium 134.  Potassium 2.6.  BUN 20 creatinine 2.0.  Calcium 11.8 with an albumin of 2.1.  Total protein 10.7.  He had a CT of the body yesterday.  This shows small bilateral pleural effusions.  He has small pericardial effusion.  He had a infrarenal abdominal aortic aneurysm.  Bones looked fine.  There is no obvious adenopathy.  Had a CT of the brain.  This showed no acute intracranial abnormalities.  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  He had an MRI of the lumbar spine.  This was on 04/05/2023.  This showed spondylolysis.  He had spinal canal stenosis at L4-L5.  He had low-level bone marrow signal.  Cultures of all been negative.  Urinalysis make urine showed high glucose.  He had moderate blood when repeated on 04/05/2023.  This morning, he really is not all that responsive.   On physical exam, his temperature is 99.  Pulse 98.  Blood pressure 191/89.  He is head and neck exam shows no adenopathy in the neck.  There is no palpable thyroid.  There is no obvious scleral icterus.  His lungs are with some scattered wheezes bilaterally.  He has good air movement.  Cardiac exam tachycardic and irregular consistent with atrial fibrillation.  Abdomen is obese but soft.  Bowel sounds are present.  He has no obvious fluid wave.  No palpable liver or spleen tip.  Extremities shows some mild edema in the legs bilaterally.  Neurological exam shows no focal neurological deficits.  He  also has some cognitive impairment.  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  His total protein is quite high.  His calcium is quite high.  His albumin is low.  I would give him calcitonin for his hypercalcemia.  Again, I have to believe that he is going to have myeloma or possibly Waldenstrom's.  I will check a serum viscosity on him make sure that there is no hyperviscosity that could be causing some of the mental status changes.  Ultimately, I suspect is by going need to have a bone marrow biopsy done.  We will follow along.  Hopefully, he will become more alert so I can talk to them.  Obviously, he will get incredible care from everybody down in the ICU.   Christin Bach, MD  Fayrene Fearing 1:5

## 2023-04-08 NOTE — Progress Notes (Signed)
 PROGRESS NOTE    Ronnie Dunn  ZOX:[id number] DOB: 15-Jul-1960 DOA: 04/03/2023 PCP: [name], MD   Brief Narrative: 63 yo male  with past medical history significant for COPD, chronic diastolic congestive heart failure, HTN, HLD, paroxysmal atrial fibrillation on anticoagulation with Xarelto, GERD, OSA, tobacco use disorder, alcohol use disorder, obesity who presented to [facility] ED from home via EMS on 04/03/2022 with complaints of productive cough, fever/chills, nausea, progressive weakness/fatigue and shortness of breath over the last 2 weeks.  Spouse also noted that he was confused, not wanting to eat or drink anything.  Also reported to be hypothermic with temperature 94.0 F.  Also has had complaints of low back pain for past two weeks which is new,no falls reported    Sputum thick, green in appearance.  Recently completed a course of azithromycin prescribed by his pulmonologist, [name] which did not improve his symptoms.  Given progression, confusion spouse called EMS and patient was brought to the ED for further evaluation management.   In the ED, temperature 99.4 F, HR 65, RR 23, BP 145/90, SpO2 88% on room air.  WBC 8.1, hemoglobin 11.8, platelet count 337.  Sodium 134, potassium 2.6, chloride 89, CO2 33, glucose 113, BUN 20, creatinine 2.00.  Lipase 27, AST 18, ALT less than 5, total bilirubin 0.8.  Ammonia level 24.  BNP 706.8.  Lactic acid 1.7.  INR 1.9.  COVID/influenza/RSV PCR negative.  TSH 3.591.  Urinalysis unrevealing.  Chest x-ray with increased bilateral interstitial and diffuse patchy opacities likely pulmonary edema, blunting of the costophrenic angles which may represent small pleural effusions, noted cardiomegaly.  Patient was given 2 L LR bolus, started on vancomycin, cefepime and metronidazole by EDP.  TRH consulted for admission for further evaluation management of acute hypoxic respiratory failure likely secondary to community-acquired  pneumonia.  Assessment & Plan:   Principal Problem:   Sepsis due to pneumonia [facility]) Active Problems:   AKI (acute kidney injury) (HCC)   Acute metabolic encephalopathy  FUO  COVID RSV and influenza were negative temperature was 99.4 white count 8.1 and lactic acid 1.7.  Initially started on vancomycin and cefepime.  Vancomycin stopped as with MRSA PCR was negative, cefepime was stopped 04/05/2023 due to change in mental status, Rocephin started 04/05/2023.  Patient was transferred to stepdown on 3/21 due to respiratory distress  PCCM started zosyn and ID was consulted CT chest abdomen and pelvis did not reveal any evidence of acute infection. Antibiotics were stopped 3/22   Rule out multiple myeloma/Waldenstrm's macroglobulinemia appreciate oncology input.  SPEP UPEP pending ordered checking  serum viscosity.  A-fib RVR has history of atrial fibrillation on Xarelto and beta-blockers continue  Hypokalemia -potassium 2.7 replete   COPD Not oxygen dependent at baseline.  Follows with pulmonology outpatient [name]. On Trelegy Ellipta at baseline   Low back pain for the last 2 weeks per wife MRI lumbar spine -lumbar spondylosis, moderate bilateral neuroforaminal stenosis at L5-S1, mild spinal canal stenosis, moderate right lateral recess stenosis at L4-L5  Metabolic encephalopathy likely multifactorial -hypoxic respiratory failure/hyperammonia/EtOH /hypercalcemia/?  Seizures- wife reports he is more confused which is not his baseline he is hallucinating UA on admission was negative for UTI CT head remote left cerebellar infarct TSH normal  Ammonia 67 up from 55 core track placed in stepdown for lactulose Continue lactulose EEG MRI brain  Chronic diastolic congestive heart failure, decompensated Essential hypertension Home regimen includes metoprolol succinate 50 mg p.o. daily, hydralazine 50  mg p.o. 3 times daily, spironolactone 25 mg p.o. daily, lisinopril 5 mg p.o. daily,  furosemide 120 mg p.o. daily.  Patient with elevated BNP 706.8 and with chest x-ray findings also concerning for pulmonary edema.-Chest x-ray shows fluid overload lasix currently on hold   Paroxysmal atrial fibrillation -- Metoprolol succinate 50 mg p.o. daily -- Continue Xarelto   GERD -- Protonix 40 mg p.o. daily   HLD -- Atorvastatin 10 mg p.o. daily   Tobacco use disorder EtOH use disorder Counsel need for complete cessation/abstinence. --Nicotine patch   Obesity, class I Body mass index is 31.53 kg/m.    OSA --Continue nocturnal CPAP   Moderate hypercalcemia -status post Zometa  Calcitonin unclear etiology ?malignancy?myeloma Await vitamin D phosphorus PTH ionized calcium SPEP UPEP  AKI creatinine worsening daily ultrasound of the kidneys showed no hydronephrosis.  Appreciate nephrology, gentle IV fluids. DVT prophylaxis:  rivaroxaban (XARELTO) tablet 20 mg    Code Status: Full Code Family Communication: dw wife    Estimated body mass index is 30.03 kg/m as calculated from the following:   Height as of this encounter: 6\' 2"  (1.88 m).   Weight as of this encounter: 106.1 kg.  DVT prophylaxis:xarelto Code Status:full Family Communication:dw wife and daughter on the phone Disposition Plan:  Status is: Inpatient Remains inpatient appropriate because: acute illness   Consultants: none  Procedures:none  Antimicrobials:rocephin  Subjective:  Remains restless and encephalopathic objective: Vitals:   04/08/23 1031 04/08/23 1033 04/08/23 1230 04/08/23 1243  BP: (!) 183/116 (!) 183/116  (!) 155/72  Pulse:  (!) 109  100  Resp:      Temp:   99.5 F (37.5 C)   TempSrc:   Oral   SpO2:      Weight:      Height:        Intake/Output Summary (Last 24 hours) at 04/08/2023 1402 Last data filed at 04/08/2023 1353 Gross per 24 hour  Intake 2045.05 ml  Output 1235 ml  Net 810.05 ml   Filed Weights   04/06/23 0429 04/07/23 0500 04/08/23 0100  Weight: 105 kg  103.9 kg 106.1 kg    Examination:  General exam: Seen in mittens responds to pain Respiratory system: Rhonchi to auscultation.  Tachypneic cardiovascular system: Irregular tachycardic gastrointestinal system: Abdomen is nondistended, soft and nontender. No organomegaly or masses felt. Normal bowel sounds heard. Central nervous system: mittens in place Extremities: Edema  Data Reviewed: I have personally reviewed following labs and imaging studies  CBC: Recent Labs  Lab 04/03/23 1118 04/04/23 0244 04/05/23 0735 04/07/23 1251 04/08/23 0830  WBC 8.1 7.1 7.9 9.8 8.9  NEUTROABS 5.9  --   --   --   --   HGB 11.8* 11.2* 11.8* 13.1 12.2*  HCT 38.7* 36.5* 37.7* 43.4 39.2  MCV 100.8* 100.0 98.4 100.7* 99.2  PLT 337 318 346 363 289   Basic Metabolic Panel: Recent Labs  Lab 04/04/23 0244 04/05/23 0735 04/06/23 0515 04/06/23 0948 04/07/23 0508 04/08/23 0311 04/08/23 0830  NA 135 136 137 142 145  --  146*  K 2.4* 2.8* 2.6* 2.8* 3.2*  --  2.7*  CL 93* 96* 96* 100 107  --  108  CO2 30 28 29 25 23   --  26  GLUCOSE 96 108* 128* 120* 120*  --  123*  BUN 21 30* 32* 33* 43*  --  58*  CREATININE 1.81* 1.97* 1.99* 2.10* 2.37*  --  2.91*  CALCIUM 11.2* 11.8* 11.8* 12.3* 12.2*  --  11.0*  MG 1.3* 1.9  --   --   --  1.8  --   PHOS  --  2.0*  --  3.9  --   --  2.5   GFR: Estimated Creatinine Clearance: 33.7 mL/min (A) (by C-G formula based on SCr of 2.91 mg/dL (H)). Liver Function Tests: Recent Labs  Lab 04/03/23 1118 04/06/23 0948 04/07/23 0508 04/08/23 0830  AST 18 27 26 22   ALT 5 5 9 7   ALKPHOS 70 70 72 77  BILITOT 0.8 0.8 1.1 1.0  PROT 10.7* 11.1* 11.2* 10.8*  ALBUMIN 2.1* 2.0* 2.2* 1.8*   Recent Labs  Lab 04/03/23 1118  LIPASE 27   Recent Labs  Lab 04/03/23 1118 04/06/23 0515 04/07/23 0601  AMMONIA 24 55* 67*   Coagulation Profile: Recent Labs  Lab 04/03/23 1118  INR 1.9*   Cardiac Enzymes: No results for input(s): "CKTOTAL", "CKMB", "CKMBINDEX",  "TROPONINI" in the last 168 hours. BNP (last 3 results) No results for input(s): "PROBNP" in the last 8760 hours. HbA1C: No results for input(s): "HGBA1C" in the last 72 hours. CBG: Recent Labs  Lab 04/03/23 1712 04/06/23 1514 04/07/23 1149 04/07/23 2054  GLUCAP 133* 119* 118* 127*   Lipid Profile: No results for input(s): "CHOL", "HDL", "LDLCALC", "TRIG", "CHOLHDL", "LDLDIRECT" in the last 72 hours. Thyroid Function Tests: No results for input(s): "TSH", "T4TOTAL", "FREET4", "T3FREE", "THYROIDAB" in the last 72 hours.  Anemia Panel: Recent Labs    04/06/23 2352  FOLATE >40.0   Sepsis Labs: Recent Labs  Lab 04/03/23 1111 04/03/23 1432 04/07/23 1315 04/07/23 1552  PROCALCITON  --   --  1.34  --   LATICACIDVEN 1.7 1.1 2.0* 2.4*    Recent Results (from the past 240 hours)  Resp panel by RT-PCR (RSV, Flu A&B, Covid) Anterior Nasal Swab     Status: None   Collection Time: 04/03/23 11:18 AM   Specimen: Anterior Nasal Swab  Result Value Ref Range Status   SARS Coronavirus 2 by RT PCR NEGATIVE NEGATIVE Final    Comment: (NOTE) SARS-CoV-2 target nucleic acids are NOT DETECTED.  The SARS-CoV-2 RNA is generally detectable in upper respiratory specimens during the acute phase of infection. The lowest concentration of SARS-CoV-2 viral copies this assay can detect is 138 copies/mL. A negative result does not preclude SARS-Cov-2 infection and should not be used as the sole basis for treatment or other patient management decisions. A negative result may occur with  improper specimen collection/handling, submission of specimen other than nasopharyngeal swab, presence of viral mutation(s) within the areas targeted by this assay, and inadequate number of viral copies(<138 copies/mL). A negative result must be combined with clinical observations, patient history, and epidemiological information. The expected result is Negative.  Fact Sheet for Patients:   BloggerCourse.com  Fact Sheet for Healthcare Providers:  SeriousBroker.it  This test is no t yet approved or cleared by the [place] FDA and  has been authorized for detection and/or diagnosis of SARS-CoV-2 by FDA under an Emergency Use Authorization (EUA). This EUA will remain  in effect (meaning this test can be used) for the duration of the COVID-19 declaration under Section 564(b)(1) of the Act, 21 U.S.C.section 360bbb-3(b)(1), unless the authorization is terminated  or revoked sooner.       Influenza A by PCR NEGATIVE NEGATIVE Final   Influenza B by PCR NEGATIVE NEGATIVE Final    Comment: (NOTE) The Xpert Xpress SARS-CoV-2/FLU/RSV plus assay is intended as an aid in the diagnosis of  influenza from Nasopharyngeal swab specimens and should not be used as a sole basis for treatment. Nasal washings and aspirates are unacceptable for Xpert Xpress SARS-CoV-2/FLU/RSV testing.  Fact Sheet for Patients: BloggerCourse.com  Fact Sheet for Healthcare Providers: SeriousBroker.it  This test is not yet approved or cleared by the [place] FDA and has been authorized for detection and/or diagnosis of SARS-CoV-2 by FDA under an Emergency Use Authorization (EUA). This EUA will remain in effect (meaning this test can be used) for the duration of the COVID-19 declaration under Section 564(b)(1) of the Act, 21 U.S.C. section 360bbb-3(b)(1), unless the authorization is terminated or revoked.     Resp Syncytial Virus by PCR NEGATIVE NEGATIVE Final    Comment: (NOTE) Fact Sheet for Patients: BloggerCourse.com  Fact Sheet for Healthcare Providers: SeriousBroker.it  This test is not yet approved or cleared by the [place] FDA and has been authorized for detection and/or diagnosis of SARS-CoV-2 by FDA under an Emergency Use  Authorization (EUA). This EUA will remain in effect (meaning this test can be used) for the duration of the COVID-19 declaration under Section 564(b)(1) of the Act, 21 U.S.C. section 360bbb-3(b)(1), unless the authorization is terminated or revoked.  Performed at [facility], [address] [place], [place] [address]   Blood Culture (routine x 2)     Status: None   Collection Time: 04/03/23 11:18 AM   Specimen: BLOOD  Result Value Ref Range Status   Specimen Description   Final    BLOOD LEFT ANTECUBITAL Performed at [facility], [address], [place], [place] [address]    Special Requests   Final    BOTTLES DRAWN AEROBIC AND ANAEROBIC Blood Culture adequate volume Performed at [facility], [address], [place], [place] [address]    Culture   Final    NO GROWTH 5 DAYS Performed at [facility], [address], [place], [place] [address]    Report Status 04/08/2023 FINAL  Final  Blood Culture (routine x 2)     Status: None   Collection Time: 04/03/23 11:18 AM   Specimen: BLOOD RIGHT WRIST  Result Value Ref Range Status   Specimen Description   Final    BLOOD RIGHT WRIST Performed at [facility], [address], [place], [place] [address]    Special Requests   Final    BOTTLES DRAWN AEROBIC AND ANAEROBIC Blood Culture adequate volume Performed at [facility], [address], [place], [place] [address]    Culture   Final    NO GROWTH 5 DAYS Performed at [facility], [address] [place], [place] [address]    Report Status 04/08/2023 FINAL  Final  MRSA Next Gen by PCR, Nasal     Status: None   Collection Time: 04/03/23  5:17 PM   Specimen: Nasal Mucosa; Nasal Swab  Result Value Ref Range Status   MRSA by PCR Next Gen NOT DETECTED NOT DETECTED Final    Comment: (NOTE) The GeneXpert MRSA Assay (FDA approved for NASAL specimens only), is one component of a  comprehensive MRSA colonization surveillance program. It is not intended to diagnose MRSA infection nor to guide or monitor treatment for MRSA infections. Test performance is not FDA approved in patients less than 27 years old. Performed at [facility], [address] [place], [place] [address]   Culture, blood (Routine X 2) w Reflex to ID Panel  Status: None (Preliminary result)   Collection Time: 04/07/23  1:15 PM   Specimen: BLOOD LEFT HAND  Result Value Ref Range Status   Specimen Description   Final    BLOOD LEFT HAND Performed at [facility], [address], [place], [place] [address]    Special Requests   Final    BOTTLES DRAWN AEROBIC ONLY Blood Culture adequate volume Performed at [facility], [address], [place], [place] [address]    Culture   Final    NO GROWTH < 24 HOURS Performed at [facility], [address], [place], [place] [address]    Report Status PENDING  Incomplete  Culture, blood (Routine X 2) w Reflex to ID Panel     Status: None (Preliminary result)   Collection Time: 04/07/23  2:08 PM   Specimen: BLOOD RIGHT ARM  Result Value Ref Range Status   Specimen Description   Final    BLOOD RIGHT ARM Performed at [facility], [address] [place], [place] [address]    Special Requests   Final    BOTTLES DRAWN AEROBIC AND ANAEROBIC Blood Culture results may not be optimal due to an inadequate volume of blood received in culture bottles Performed at [facility], [address], [place], [place] [address]    Culture   Final    NO GROWTH < 24 HOURS Performed at [facility], [address] [place], [place] [address]    Report Status PENDING  Incomplete         Radiology Studies: CT CHEST ABDOMEN PELVIS WO CONTRAST Result Date: 04/07/2023 CLINICAL DATA:  Sepsis EXAM: CT CHEST, ABDOMEN AND PELVIS WITHOUT CONTRAST TECHNIQUE: Multidetector CT imaging of the  chest, abdomen and pelvis was performed following the standard protocol without IV contrast. RADIATION DOSE REDUCTION: This exam was performed according to the departmental dose-optimization program which includes automated exposure control, adjustment of the mA and/or kV according to patient size and/or use of iterative reconstruction technique. COMPARISON:  None Available. FINDINGS: CT CHEST FINDINGS Cardiovascular: Aortic atherosclerosis. Cardiomegaly. Three-vessel coronary artery calcifications. Small pericardial effusion. Mediastinum/Nodes: No enlarged mediastinal, hilar, or axillary lymph nodes. Thyroid gland, trachea, and esophagus demonstrate no significant findings. Lungs/Pleura: Mild centrilobular and paraseptal emphysema. Small bilateral pleural effusions Musculoskeletal: No chest wall abnormality. No acute osseous findings. CT ABDOMEN PELVIS FINDINGS Hepatobiliary: No solid liver abnormality is seen. No gallstones, gallbladder wall thickening, or biliary dilatation. Pancreas: Unremarkable. No pancreatic ductal dilatation or surrounding inflammatory changes. Spleen: Normal in size without significant abnormality. Adrenals/Urinary Tract: Adrenal glands are unremarkable. Kidneys are normal, without renal calculi, solid lesion, or hydronephrosis. Bladder is decompressed by Foley catheter. Stomach/Bowel: Stomach is within normal limits. Esophagogastric tube with tip in the proximal duodenal. Appendix appears normal. No evidence of bowel wall thickening, distention, or inflammatory changes. Vascular/Lymphatic: Aortic atherosclerosis. Infrarenal abdominal aortic aneurysm measuring up to 4.2 x 3.5 cm in caliber (series 2, image 69). No enlarged abdominal or pelvic lymph nodes. Reproductive: No mass or other abnormality. Other: Small fat containing inguinal hernias.  No ascites. Musculoskeletal: No acute osseous findings. IMPRESSION: 1.  Small bilateral pleural effusions. 2.  Small pericardial effusion. 3.  Infrarenal abdominal aortic aneurysm measuring up to 4.2 x 3.5 cm in caliber. Recommend follow-up CT or MR as appropriate in 12 months and referral to or continued care with vascular specialist. (Ref.: J Vasc Surg. 2018; 67:2-77 and J Am Coll Radiol 2013;10(10):789-794.) 4.  Coronary  artery disease. Aortic Atherosclerosis (ICD10-I70.0) and Emphysema (ICD10-J43.9). Electronically Signed   By: Jearld Lesch M.D.   On: 04/07/2023 17:50   DG Chest 1 View Result Date: 04/07/2023 CLINICAL DATA:  Hypoxia. EXAM: CHEST  1 VIEW COMPARISON:  April 06, 2023. FINDINGS: Stable cardiomegaly. Right lung is clear. Minimal left basilar subsegmental atelectasis is noted. Bony thorax is unremarkable. IMPRESSION: Minimal left basilar subsegmental atelectasis. Electronically Signed   By: Lupita Raider M.D.   On: 04/07/2023 13:14   DG Abd Portable 1V Result Date: 04/07/2023 CLINICAL DATA:  NG tube placement EXAM: PORTABLE ABDOMEN - 1 VIEW COMPARISON:  None Available. FINDINGS: Limited x-ray for tube placement has feeding tube with tip extending to the right mid abdomen, possibly distal stomach or proximal duodenum. There are air-filled loops of distended bowel throughout the mid abdomen including some mildly dilated loops. Please correlate with history. Enlarged heart. IMPRESSION: Limited x-ray for tube placement has feeding tube extending to the right side of the abdomen, possibly distal stomach or proximal duodenum Electronically Signed   By: Karen Kays M.D.   On: 04/07/2023 13:00   DG Chest 1 View Result Date: 04/06/2023 CLINICAL DATA:  Hypoxia. EXAM: CHEST  1 VIEW COMPARISON:  Radiograph 04/03/2023 FINDINGS: The heart is enlarged. Increase in diffuse bilateral interstitial and airspace opacities. Suspected small pleural effusions, left greater than right. No pneumothorax. IMPRESSION: 1. Cardiomegaly with diffuse bilateral interstitial and airspace opacities, increased from prior. Findings may represent pulmonary edema or  multifocal pneumonia. 2. Suspected small pleural effusions, left greater than right. Electronically Signed   By: Narda Rutherford M.D.   On: 04/06/2023 18:45   Scheduled Meds:  arformoterol  15 mcg Nebulization BID   budesonide (PULMICORT) nebulizer solution  0.5 mg Nebulization BID   calcitonin  400 Units Subcutaneous BID   Chlorhexidine Gluconate Cloth  6 each Topical Daily   famotidine  40 mg Per Tube Daily   folic acid  1 mg Per Tube Daily   hydrALAZINE  50 mg Per Tube TID   lactulose  30 g Per Tube BID   metoprolol tartrate  50 mg Per Tube BID   multivitamin  15 mL Per Tube Daily   nicotine  21 mg Transdermal Daily   mouth rinse  15 mL Mouth Rinse 4 times per day   pantoprazole (PROTONIX) IV  40 mg Intravenous Q24H   revefenacin  175 mcg Nebulization Daily   rivaroxaban  15 mg Per Tube Q supper   thiamine  100 mg Oral Daily   Or   thiamine  100 mg Intravenous Daily   Continuous Infusions:  dexmedetomidine (PRECEDEX) IV infusion 0.6 mcg/kg/hr (04/08/23 1353)   lactated ringers 50 mL/hr at 04/08/23 1353   piperacillin-tazobactam (ZOSYN)  IV Stopped (04/08/23 0650)   potassium chloride 10 mEq (04/08/23 1354)     LOS: 5 days    Time spent: 35 min  Alwyn Ren, MD  04/08/2023, 2:02 PM

## 2023-04-08 NOTE — Progress Notes (Signed)
 NAME:  Ronnie Dunn, MRN:  [id number], DOB:  [birth year], LOS: 5 ADMISSION DATE:  04/03/2023, CONSULTATION DATE:  04/07/23 REFERRING MD:  Jerolyn Center CHIEF COMPLAINT:  AMS   History of Present Illness:  Ronnie Dunn is a 63 y.o. male who has a PMH as below including but not limited to COPD, OSA on CPAP, chronic diastolic CHF, HTN, HLD, PAF on Xarelto, tobacco dependence, alcohol use.  He is followed by [name] as an outpatient. He presented to [facility] ED on 3/17 with cough, fever/chills, nausea, weakness, fatigue, dyspnea x 2 weeks.  He had some confusion on and off and decreased appetite.  He had also complained of low back pain which was new and was not associated with any recent fall or trauma.  He had productive sputum, green in color.  He recently completed a course of azithromycin as an outpatient but had no relief with this.  Due to ongoing symptoms, he presented to the ED for further workup.  Initial chest x-ray showed bilateral interstitial opacities.  He was started on IV Lasix.  Renal function worsened, he had renal ultrasound on 3/19 that showed normal kidneys.  Follow-up chest x-ray showed cardiomegaly and ongoing interstitial opacities with small pleural effusions.  Diuresis was continued; however, renal function worsened further.  Was initially on vancomycin and cefepime however cefepime was stopped due to altered mental status.  This was switched to ceftriaxone on 3/19.  Overnight 3/20, he developed A-fib RVR.  He received Lopressor with improvement in heart rate.  Early a.m. 3/21 he had altered mental status and new fever to 102.9 rectally.  He was subsequently transferred to the ICU and PCCM and infectious disease were asked to see in consultation.  Upon arrival to the ICU, he is minimally responsive but is protecting his airway.  He has heart rates in the 110s to 120s, blood pressure stable.  Mucous membranes are very dry.  ABG was obtained and was normal (7.52/41/21). He has clear urine  in his Foley.  Antibiotics were escalated from ceftriaxone to Zosyn.  Infectious disease has seen him and we have agreed to assess CT chest/abdomen/pelvis.  Pertinent  Medical History:  has Seizure (HCC); Hyponatremia; Alcohol use disorder, moderate, dependence (HCC); Class 1 obesity; Essential hypertension; Type 2 diabetes mellitus without complication, without long-term current use of insulin (HCC); Chronic obstructive pulmonary disease (HCC); Gastroesophageal reflux disease; Lower extremity edema; Permanent atrial fibrillation (HCC); OSA (obstructive sleep apnea); Chronic heart failure with preserved ejection fraction (HCC); Seizures (HCC); Protein-calorie malnutrition, moderate (HCC); Hypomagnesemia; Alcohol abuse; Obesity (BMI 30-39.9); Noncompliance with medication regimen; Aortic root dilation (HCC); Acquired thrombophilia (HCC); Sepsis due to pneumonia (HCC); AKI (acute kidney injury) (HCC); and Acute metabolic encephalopathy on their problem list.  Significant Hospital Events: Including procedures, antibiotic start and stop dates in addition to other pertinent events   3/17 admit 3/21 - Altered but protecting airway. Vitals stable.  - ID Consult: Fever, sepsis of unknown source--potentially due to aspiration in context of encephalopathy. Alcoholic liver disease. 3/AKI. 4Encephalopathy --presumably from alcohol withdrawal, hepatic encaphalopathy though no cirrhosis on CT at [facility] in 2019  Interim History / Subjective:   3/22 - HEme conslt - cocern for myleoma v Waldenstrom ( .  His total protein is quite high.  His calcium is quite high.  His albumin is low.) Getting seruum viscolity due to Acue encephalpthy. Pan CT   - unremarlkable. ID thinks low odds for infection. ID recommending MRI. RN/Wife report Obutndatioin with restlessness -  on prn ativan. Last drink > 2 weeks go  Wife describes 2022 admit for Low NA - and seziure like activity but no EEG or neuro conslt in record      Objective:  Blood pressure (!) 191/89, pulse 98, temperature 99 F (37.2 C), temperature source Axillary, resp. rate (!) 22, height 6\' 2"  (1.88 m), weight 106.1 kg, SpO2 94%.        Intake/Output Summary (Last 24 hours) at 04/08/2023 1026 Last data filed at 04/08/2023 0904 Gross per 24 hour  Intake 1738.21 ml  Output 785 ml  Net 953.21 ml   Filed Weights   04/06/23 0429 04/07/23 0500 04/08/23 0100  Weight: 105 kg 103.9 kg 106.1 kg    Examination: General: Adult male, chronically ill appearing. Neuro: Altered, minimally responsive but is protecting airway. Does not follow commands. HEENT: [place]/AT. Sclerae anicteric. MM dry. Cardiovascular: Tachy, Caren Hazy, no M/R/G.  Lungs: Respirations even and unlabored.  CTA bilaterally, No W/R/R. Abdomen: Obese. BS x 4, soft, NT/ND.  Musculoskeletal: No gross deformities, no edema.  Skin: Intact, warm, no rashes.   Labs/imaging personally reviewed:  CT head 3/19 > neg. Renal US 3/19 > neg. CT chest/abd/pelv 3/21 >   Assessment & Plan:  HX OF ETOH Acute metabolic encephalopathy - multifactorial 2/2 hyperammonemia, EtOH, presumed early developing sepsis of unclear etiology. CT head neg, TSH, ABG normal. Folate elevated, B12 pending.   3/22- restless obtundation continues. Unclear cause though Multiple Myeloma ias a DDx  PLAN  - do ceribell for weekend d/w Dr Otelia Limes -> then get Regular EEG - MRI brain wo contrast (has low GFR)  - START PRCEDEX -a wait serum viscosity - avoid cephalosporins - PRN ATIVAN  Sepsis of unclear etiology - some suspicion for early aspiration PNA. Some congestion vs infiltrate on CXR,  3/22 0 ID does not think there is infection o ratleast low propb. Not recommending LP  Plan Anti-infectives (From admission, onward)    Start     Dose/Rate Route Frequency Ordered Stop   04/07/23 1030  piperacillin-tazobactam (ZOSYN) IVPB 3.375 g        3.375 g 12.5 mL/hr over 240 Minutes Intravenous Every 8 hours  04/07/23 1011     04/05/23 1200  cefTRIAXone (ROCEPHIN) 2 g in sodium chloride 0.9 % 100 mL IVPB  Status:  Discontinued        2 g 200 mL/hr over 30 Minutes Intravenous Every 24 hours 04/05/23 1047 04/07/23 0958   04/04/23 1300  vancomycin (VANCOREADY) IVPB 1250 mg/250 mL  Status:  Discontinued        1,250 mg 166.7 mL/hr over 90 Minutes Intravenous Every 24 hours 04/03/23 1453 04/04/23 0637   04/03/23 2330  ceFEPIme (MAXIPIME) 2 g in sodium chloride 0.9 % 100 mL IVPB  Status:  Discontinued        2 g 200 mL/hr over 30 Minutes Intravenous Every 12 hours 04/03/23 1445 04/05/23 1047   04/03/23 1215  vancomycin (VANCOCIN) IVPB 1000 mg/200 mL premix       Placed in "And" Linked Group   1,000 mg 200 mL/hr over 60 Minutes Intravenous  Once 04/03/23 1113 04/03/23 1525   04/03/23 1115  vancomycin (VANCOCIN) IVPB 1000 mg/200 mL premix       Placed in "And" Linked Group   1,000 mg 200 mL/hr over 60 Minutes Intravenous  Once 04/03/23 1113 04/03/23 1420   04/03/23 1100  ceFEPIme (MAXIPIME) 2 g in sodium chloride 0.9 % 100 mL IVPB  2 g 200 mL/hr over 30 Minutes Intravenous  Once 04/03/23 1054 04/03/23 1154   04/03/23 1100  metroNIDAZOLE (FLAGYL) IVPB 500 mg        500 mg 100 mL/hr over 60 Minutes Intravenous  Once 04/03/23 1054 04/03/23 1300   04/03/23 1100  vancomycin (VANCOCIN) IVPB 1000 mg/200 mL premix  Status:  Discontinued        1,000 mg 200 mL/hr over 60 Minutes Intravenous  Once 04/03/23 1054 04/03/23 1112       Acute hypoxic respiratory failure - 2/2 above + baseline COPD (though not on O2), OSA. Hx COPD, OSA, tobacco dependence.  3/22 - protecing airway but at risk for decompensation  PLNA - Continue supplemental O2 as needed to maintain SpO2 > 92%. - No need for BiPAP or intubation currently. - CPAP nocturnally. -. DC due to mental status - Triple therapy nebs in lieu of PTA Trelegy. - F/u on BNP. - Tobacco cessation counseling.  AKI.  3/22: WORSENING  PLAN -  Hold diuresis for now. - Gentle fluids. = Monitor Creat iwht calcitonin Rx   Hypercalcemia Hypokalemia Hyper PRoteineiam Hypoalbiuminema  Concern for myleoma. Seen by Dr Abbie Sons 04/08/23  Plan  - await serium viscosity - calcitonin per heme - 24h UPEPE/UIFFE.Light chainrs   Hx dCHF, A.fib on Xarelto, HTN, HLD. - Hold diuresis for now, appears a bit dry on exam. - F/u on BNP. - Hold PO antihypertensives for now. - Continue Xarelto. - getr repeat echo.   Best practice (evaluated daily):  Diet/type: NPO DVT prophylaxis: DOAC Pressure ulcer(s): pressure ulcer assessment deferred  GI prophylaxis: N/A Lines: N/A Foley:  N/A Code Status:  full code Last date of multidisciplinary goals of care discussion: None yet.   61 - wife updated   ATTESTATION & SIGNATURE   The patient RANDOLF SANSOUCIE is critically ill with multiple organ systems failure and requires high complexity decision making for assessment and support, frequent evaluation and titration of therapies, application of advanced monitoring technologies and extensive interpretation of multiple databases and discussion with other appropriate health care personnel such as bedside nurses, social workers, case Production designer, theatre/television/film, consultants, respiratory therapists, nutritionists, secretaries etc.,  Critical care time includes but is not restricted to just documentation time. Documentation can happen in parallel or sequential to care time depending on case mix urgency and priorities for the shift. So, overall critical Care Time devoted to patient care services described in this note is  32  Minutes.   This time reflects time of care of this signee Dr Kalman Shan which includ does not reflect procedure time, or teaching time or supervisory time of PA/NP/Med student/Med Resident etc but could involve care discussion time     Dr. Kalman Shan, M.D., [facility].C.P Pulmonary and Critical Care Medicine Staff Physician, [place]  [facility]  Pulmonary and Critical Care Pager: [phone], If no answer or between  15:00h - 7:00h: call [phone]  04/08/2023 10:26 AM    LABS    PULMONARY Recent Labs  Lab 04/03/23 1713 04/06/23 1633 04/07/23 1026  PHART 7.55*  --  7.52*  PCO2ART 41  --  41  PO2ART 70*  --  101  HCO3 35.9* 32.8* 33.1*  O2SAT 95.1 91.9 98.5    CBC Recent Labs  Lab 04/05/23 0735 04/07/23 1251 04/08/23 0830  HGB 11.8* 13.1 12.2*  HCT 37.7* 43.4 39.2  WBC 7.9 9.8 8.9  PLT 346 363 289    COAGULATION Recent Labs  Lab 04/03/23 1118  INR 1.9*    CARDIAC  No results for input(s): "TROPONINI" in the last 168 hours. No results for input(s): "PROBNP" in the last 168 hours.   CHEMISTRY Recent Labs  Lab 04/04/23 0244 04/05/23 0735 04/06/23 0515 04/06/23 0948 04/07/23 0508 04/08/23 0830  NA 135 136 137 142 145 146*  K 2.4* 2.8* 2.6* 2.8* 3.2* 2.7*  CL 93* 96* 96* 100 107 108  CO2 30 28 29 25 23 26   GLUCOSE 96 108* 128* 120* 120* 123*  BUN 21 30* 32* 33* 43* 58*  CREATININE 1.81* 1.97* 1.99* 2.10* 2.37* 2.91*  CALCIUM 11.2* 11.8* 11.8* 12.3* 12.2* 11.0*  MG 1.3* 1.9  --   --   --   --   PHOS  --  2.0*  --  3.9  --  2.5   Estimated Creatinine Clearance: 33.7 mL/min (A) (by C-G formula based on SCr of 2.91 mg/dL (H)).   LIVER Recent Labs  Lab 04/03/23 1118 04/06/23 0948 04/07/23 0508 04/08/23 0830  AST 18 27 26 22   ALT 5 5 9 7   ALKPHOS 70 70 72 77  BILITOT 0.8 0.8 1.1 1.0  PROT 10.7* 11.1* 11.2* 10.8*  ALBUMIN 2.1* 2.0* 2.2* 1.8*  INR 1.9*  --   --   --      INFECTIOUS Recent Labs  Lab 04/03/23 1432 04/07/23 1315 04/07/23 1552  LATICACIDVEN 1.1 2.0* 2.4*  PROCALCITON  --  1.34  --      ENDOCRINE CBG (last 3)  Recent Labs    04/06/23 1514 04/07/23 1149 04/07/23 2054  GLUCAP 119* 118* 127*         IMAGING x48h  - image(s) personally visualized  -   highlighted in bold CT CHEST ABDOMEN PELVIS WO CONTRAST Result Date:  04/07/2023 CLINICAL DATA:  Sepsis EXAM: CT CHEST, ABDOMEN AND PELVIS WITHOUT CONTRAST TECHNIQUE: Multidetector CT imaging of the chest, abdomen and pelvis was performed following the standard protocol without IV contrast. RADIATION DOSE REDUCTION: This exam was performed according to the departmental dose-optimization program which includes automated exposure control, adjustment of the mA and/or kV according to patient size and/or use of iterative reconstruction technique. COMPARISON:  None Available. FINDINGS: CT CHEST FINDINGS Cardiovascular: Aortic atherosclerosis. Cardiomegaly. Three-vessel coronary artery calcifications. Small pericardial effusion. Mediastinum/Nodes: No enlarged mediastinal, hilar, or axillary lymph nodes. Thyroid gland, trachea, and esophagus demonstrate no significant findings. Lungs/Pleura: Mild centrilobular and paraseptal emphysema. Small bilateral pleural effusions Musculoskeletal: No chest wall abnormality. No acute osseous findings. CT ABDOMEN PELVIS FINDINGS Hepatobiliary: No solid liver abnormality is seen. No gallstones, gallbladder wall thickening, or biliary dilatation. Pancreas: Unremarkable. No pancreatic ductal dilatation or surrounding inflammatory changes. Spleen: Normal in size without significant abnormality. Adrenals/Urinary Tract: Adrenal glands are unremarkable. Kidneys are normal, without renal calculi, solid lesion, or hydronephrosis. Bladder is decompressed by Foley catheter. Stomach/Bowel: Stomach is within normal limits. Esophagogastric tube with tip in the proximal duodenal. Appendix appears normal. No evidence of bowel wall thickening, distention, or inflammatory changes. Vascular/Lymphatic: Aortic atherosclerosis. Infrarenal abdominal aortic aneurysm measuring up to 4.2 x 3.5 cm in caliber (series 2, image 69). No enlarged abdominal or pelvic lymph nodes. Reproductive: No mass or other abnormality. Other: Small fat containing inguinal hernias.  No ascites.  Musculoskeletal: No acute osseous findings. IMPRESSION: 1.  Small bilateral pleural effusions. 2.  Small pericardial effusion. 3. Infrarenal abdominal aortic aneurysm measuring up to 4.2 x 3.5 cm in caliber. Recommend follow-up CT or MR as appropriate in 12  months and referral to or continued care with vascular specialist. (Ref.: J Vasc Surg. 2018; 67:2-77 and J Am Coll Radiol 2013;10(10):789-794.) 4.  Coronary artery disease. Aortic Atherosclerosis (ICD10-I70.0) and Emphysema (ICD10-J43.9). Electronically Signed   By: Jearld Lesch M.D.   On: 04/07/2023 17:50   DG Chest 1 View Result Date: 04/07/2023 CLINICAL DATA:  Hypoxia. EXAM: CHEST  1 VIEW COMPARISON:  April 06, 2023. FINDINGS: Stable cardiomegaly. Right lung is clear. Minimal left basilar subsegmental atelectasis is noted. Bony thorax is unremarkable. IMPRESSION: Minimal left basilar subsegmental atelectasis. Electronically Signed   By: Lupita Raider M.D.   On: 04/07/2023 13:14   DG Abd Portable 1V Result Date: 04/07/2023 CLINICAL DATA:  NG tube placement EXAM: PORTABLE ABDOMEN - 1 VIEW COMPARISON:  None Available. FINDINGS: Limited x-ray for tube placement has feeding tube with tip extending to the right mid abdomen, possibly distal stomach or proximal duodenum. There are air-filled loops of distended bowel throughout the mid abdomen including some mildly dilated loops. Please correlate with history. Enlarged heart. IMPRESSION: Limited x-ray for tube placement has feeding tube extending to the right side of the abdomen, possibly distal stomach or proximal duodenum Electronically Signed   By: Karen Kays M.D.   On: 04/07/2023 13:00   DG Chest 1 View Result Date: 04/06/2023 CLINICAL DATA:  Hypoxia. EXAM: CHEST  1 VIEW COMPARISON:  Radiograph 04/03/2023 FINDINGS: The heart is enlarged. Increase in diffuse bilateral interstitial and airspace opacities. Suspected small pleural effusions, left greater than right. No pneumothorax. IMPRESSION: 1.  Cardiomegaly with diffuse bilateral interstitial and airspace opacities, increased from prior. Findings may represent pulmonary edema or multifocal pneumonia. 2. Suspected small pleural effusions, left greater than right. Electronically Signed   By: Narda Rutherford M.D.   On: 04/06/2023 18:45

## 2023-04-08 NOTE — Progress Notes (Signed)
 Subjective: Patient is encephalopathic   Antibiotics:  Anti-infectives (From admission, onward)    Start     Dose/Rate Route Frequency Ordered Stop   04/07/23 1030  piperacillin-tazobactam (ZOSYN) IVPB 3.375 g  Status:  Discontinued        3.375 g 12.5 mL/hr over 240 Minutes Intravenous Every 8 hours 04/07/23 1011 04/08/23 1411   04/05/23 1200  cefTRIAXone (ROCEPHIN) 2 g in sodium chloride 0.9 % 100 mL IVPB  Status:  Discontinued        2 g 200 mL/hr over 30 Minutes Intravenous Every 24 hours 04/05/23 1047 04/07/23 0958   04/04/23 1300  vancomycin (VANCOREADY) IVPB 1250 mg/250 mL  Status:  Discontinued        1,250 mg 166.7 mL/hr over 90 Minutes Intravenous Every 24 hours 04/03/23 1453 04/04/23 0637   04/03/23 2330  ceFEPIme (MAXIPIME) 2 g in sodium chloride 0.9 % 100 mL IVPB  Status:  Discontinued        2 g 200 mL/hr over 30 Minutes Intravenous Every 12 hours 04/03/23 1445 04/05/23 1047   04/03/23 1215  vancomycin (VANCOCIN) IVPB 1000 mg/200 mL premix       Placed in "And" Linked Group   1,000 mg 200 mL/hr over 60 Minutes Intravenous  Once 04/03/23 1113 04/03/23 1525   04/03/23 1115  vancomycin (VANCOCIN) IVPB 1000 mg/200 mL premix       Placed in "And" Linked Group   1,000 mg 200 mL/hr over 60 Minutes Intravenous  Once 04/03/23 1113 04/03/23 1420   04/03/23 1100  ceFEPIme (MAXIPIME) 2 g in sodium chloride 0.9 % 100 mL IVPB        2 g 200 mL/hr over 30 Minutes Intravenous  Once 04/03/23 1054 04/03/23 1154   04/03/23 1100  metroNIDAZOLE (FLAGYL) IVPB 500 mg        500 mg 100 mL/hr over 60 Minutes Intravenous  Once 04/03/23 1054 04/03/23 1300   04/03/23 1100  vancomycin (VANCOCIN) IVPB 1000 mg/200 mL premix  Status:  Discontinued        1,000 mg 200 mL/hr over 60 Minutes Intravenous  Once 04/03/23 1054 04/03/23 1112       Medications: Scheduled Meds:  arformoterol  15 mcg Nebulization BID   budesonide (PULMICORT) nebulizer solution  0.5 mg Nebulization BID    calcitonin  400 Units Subcutaneous BID   Chlorhexidine Gluconate Cloth  6 each Topical Daily   famotidine  40 mg Per Tube Daily   folic acid  1 mg Per Tube Daily   hydrALAZINE  50 mg Per Tube TID   lactulose  30 g Per Tube BID   metoprolol tartrate  50 mg Per Tube BID   multivitamin  15 mL Per Tube Daily   nicotine  21 mg Transdermal Daily   mouth rinse  15 mL Mouth Rinse 4 times per day   pantoprazole (PROTONIX) IV  40 mg Intravenous Q24H   revefenacin  175 mcg Nebulization Daily   rivaroxaban  15 mg Per Tube Q supper   thiamine  100 mg Oral Daily   Or   thiamine  100 mg Intravenous Daily   Continuous Infusions:  dexmedetomidine (PRECEDEX) IV infusion 0.6 mcg/kg/hr (04/08/23 1353)   lactated ringers 50 mL/hr at 04/08/23 1353   potassium chloride 10 mEq (04/08/23 1354)   PRN Meds:.acetaminophen (TYLENOL) oral liquid 160 mg/5 mL, acetaminophen **OR** acetaminophen, albuterol, haloperidol lactate, hydrALAZINE, LORazepam, LORazepam **OR** LORazepam, metoprolol tartrate, ondansetron **OR** ondansetron (  ZOFRAN) IV, mouth rinse, sodium chloride, traZODone    Objective: Weight change: 2.2 kg  Intake/Output Summary (Last 24 hours) at 04/08/2023 1412 Last data filed at 04/08/2023 1353 Gross per 24 hour  Intake 2045.05 ml  Output 1235 ml  Net 810.05 ml   Blood pressure (!) 155/72, pulse 100, temperature 99.5 F (37.5 C), temperature source Oral, resp. rate (!) 22, height 6\' 2"  (1.88 m), weight 106.1 kg, SpO2 94%. Temp:  [98.3 F (36.8 C)-101.1 F (38.4 C)] 99.5 F (37.5 C) (03/22 1230) Pulse Rate:  [80-125] 100 (03/22 1243) Resp:  [21-38] 22 (03/22 0614) BP: (127-216)/(72-141) 155/72 (03/22 1243) SpO2:  [91 %-99 %] 94 % (03/22 0614) Weight:  [106.1 kg] 106.1 kg (03/22 0100)  Physical Exam: Physical Exam Constitutional:      Appearance: He is obese. He is ill-appearing.  HENT:     Head: Normocephalic and atraumatic.  Eyes:     General:        Right eye: No discharge.         Left eye: No discharge.     Extraocular Movements: Extraocular movements intact.     Conjunctiva/sclera: Conjunctivae normal.  Cardiovascular:     Rate and Rhythm: Rhythm irregular.     Heart sounds: No murmur heard.    No friction rub. No gallop.  Pulmonary:     Effort: No respiratory distress.     Breath sounds: No wheezing.  Abdominal:     General: There is no distension.     Tenderness: There is no abdominal tenderness.  Neurological:     Mental Status: He is disoriented.      CBC:    BMET Recent Labs    04/07/23 0508 04/08/23 0830  NA 145 146*  K 3.2* 2.7*  CL 107 108  CO2 23 26  GLUCOSE 120* 123*  BUN 43* 58*  CREATININE 2.37* 2.91*  CALCIUM 12.2* 11.0*     Liver Panel  Recent Labs    04/07/23 0508 04/08/23 0830  PROT 11.2* 10.8*  ALBUMIN 2.2* 1.8*  AST 26 22  ALT 9 7  ALKPHOS 72 77  BILITOT 1.1 1.0       Sedimentation Rate No results for input(s): "ESRSEDRATE" in the last 72 hours. C-Reactive Protein No results for input(s): "CRP" in the last 72 hours.  Micro Results: Recent Results (from the past 720 hours)  Resp panel by RT-PCR (RSV, Flu A&B, Covid) Anterior Nasal Swab     Status: None   Collection Time: 04/03/23 11:18 AM   Specimen: Anterior Nasal Swab  Result Value Ref Range Status   SARS Coronavirus 2 by RT PCR NEGATIVE NEGATIVE Final    Comment: (NOTE) SARS-CoV-2 target nucleic acids are NOT DETECTED.  The SARS-CoV-2 RNA is generally detectable in upper respiratory specimens during the acute phase of infection. The lowest concentration of SARS-CoV-2 viral copies this assay can detect is 138 copies/mL. A negative result does not preclude SARS-Cov-2 infection and should not be used as the sole basis for treatment or other patient management decisions. A negative result may occur with  improper specimen collection/handling, submission of specimen other than nasopharyngeal swab, presence of viral mutation(s) within the areas  targeted by this assay, and inadequate number of viral copies(<138 copies/mL). A negative result must be combined with clinical observations, patient history, and epidemiological information. The expected result is Negative.  Fact Sheet for Patients:  BloggerCourse.com  Fact Sheet for Healthcare Providers:  SeriousBroker.it  This test is no t  yet approved or cleared by the [place] and  has been authorized for detection and/or diagnosis of SARS-CoV-2 by FDA under an Emergency Use Authorization (EUA). This EUA will remain  in effect (meaning this test can be used) for the duration of the COVID-19 declaration under Section 564(b)(1) of the Act, 21 U.S.C.section 360bbb-3(b)(1), unless the authorization is terminated  or revoked sooner.       Influenza A by PCR NEGATIVE NEGATIVE Final   Influenza B by PCR NEGATIVE NEGATIVE Final    Comment: (NOTE) The Xpert Xpress SARS-CoV-2/FLU/RSV plus assay is intended as an aid in the diagnosis of influenza from Nasopharyngeal swab specimens and should not be used as a sole basis for treatment. Nasal washings and aspirates are unacceptable for Xpert Xpress SARS-CoV-2/FLU/RSV testing.  Fact Sheet for Patients: BloggerCourse.com  Fact Sheet for Healthcare Providers: SeriousBroker.it  This test is not yet approved or cleared by the [place] FDA and has been authorized for detection and/or diagnosis of SARS-CoV-2 by FDA under an Emergency Use Authorization (EUA). This EUA will remain in effect (meaning this test can be used) for the duration of the COVID-19 declaration under Section 564(b)(1) of the Act, 21 U.S.C. section 360bbb-3(b)(1), unless the authorization is terminated or revoked.     Resp Syncytial Virus by PCR NEGATIVE NEGATIVE Final    Comment: (NOTE) Fact Sheet for  Patients: BloggerCourse.com  Fact Sheet for Healthcare Providers: SeriousBroker.it  This test is not yet approved or cleared by the [place] FDA and has been authorized for detection and/or diagnosis of SARS-CoV-2 by FDA under an Emergency Use Authorization (EUA). This EUA will remain in effect (meaning this test can be used) for the duration of the COVID-19 declaration under Section 564(b)(1) of the Act, 21 U.S.C. section 360bbb-3(b)(1), unless the authorization is terminated or revoked.  Performed at [facility], [address] [place], [place] [address]   Blood Culture (routine x 2)     Status: None   Collection Time: 04/03/23 11:18 AM   Specimen: BLOOD  Result Value Ref Range Status   Specimen Description   Final    BLOOD LEFT ANTECUBITAL Performed at [facility], [address] [place], [place] [address]    Special Requests   Final    BOTTLES DRAWN AEROBIC AND ANAEROBIC Blood Culture adequate volume Performed at [facility], [address], [place], [place] [address]    Culture   Final    NO GROWTH 5 DAYS Performed at [facility], [address] [place], [place] [address]    Report Status 04/08/2023 FINAL  Final  Blood Culture (routine x 2)     Status: None   Collection Time: 04/03/23 11:18 AM   Specimen: BLOOD RIGHT WRIST  Result Value Ref Range Status   Specimen Description   Final    BLOOD RIGHT WRIST Performed at [facility], [address], [place], [place] [address]    Special Requests   Final    BOTTLES DRAWN AEROBIC AND ANAEROBIC Blood Culture adequate volume Performed at [facility], [address] [place], [place] [address]    Culture   Final    NO GROWTH 5 DAYS Performed at [facility], [address], [place], [place] [address]    Report Status 04/08/2023 FINAL  Final  MRSA Next Gen by PCR,  Nasal     Status: None   Collection Time: 04/03/23  5:17  PM   Specimen: Nasal Mucosa; Nasal Swab  Result Value Ref Range Status   MRSA by PCR Next Gen NOT DETECTED NOT DETECTED Final    Comment: (NOTE) The GeneXpert MRSA Assay (FDA approved for NASAL specimens only), is one component of a comprehensive MRSA colonization surveillance program. It is not intended to diagnose MRSA infection nor to guide or monitor treatment for MRSA infections. Test performance is not FDA approved in patients less than 30 years old. Performed at [facility], [address] [place], [place] [address]   Culture, blood (Routine X 2) w Reflex to ID Panel     Status: None (Preliminary result)   Collection Time: 04/07/23  1:15 PM   Specimen: BLOOD LEFT HAND  Result Value Ref Range Status   Specimen Description   Final    BLOOD LEFT HAND Performed at [facility], [address] [place], [place] [address]    Special Requests   Final    BOTTLES DRAWN AEROBIC ONLY Blood Culture adequate volume Performed at [facility], [address] [place], [place] [address]    Culture   Final    NO GROWTH < 24 HOURS Performed at [facility], [address] [place], [place] [address]    Report Status PENDING  Incomplete  Culture, blood (Routine X 2) w Reflex to ID Panel     Status: None (Preliminary result)   Collection Time: 04/07/23  2:08 PM   Specimen: BLOOD RIGHT ARM  Result Value Ref Range Status   Specimen Description   Final    BLOOD RIGHT ARM Performed at [facility], [address] [place], [place] [address]    Special Requests   Final    BOTTLES DRAWN AEROBIC AND ANAEROBIC Blood Culture results may not be optimal due to an inadequate volume of blood received in culture bottles Performed at [facility], [address], [place], [place] [address]    Culture   Final    NO GROWTH < 24 HOURS Performed at [facility], [address] [place], [place] [address]    Report Status PENDING  Incomplete    Studies/Results: CT CHEST ABDOMEN PELVIS WO CONTRAST Result Date: 04/07/2023 CLINICAL DATA:  Sepsis EXAM: CT CHEST, ABDOMEN AND PELVIS WITHOUT CONTRAST TECHNIQUE: Multidetector CT imaging of the chest, abdomen and pelvis was performed following the standard protocol without IV contrast. RADIATION DOSE REDUCTION: This exam was performed according to the departmental dose-optimization program which includes automated exposure control, adjustment of the mA and/or kV according to patient size and/or use of iterative reconstruction technique. COMPARISON:  None Available. FINDINGS: CT CHEST FINDINGS Cardiovascular: Aortic atherosclerosis. Cardiomegaly. Three-vessel coronary artery calcifications. Small pericardial effusion. Mediastinum/Nodes: No enlarged mediastinal, hilar, or axillary lymph nodes. Thyroid gland, trachea, and esophagus demonstrate no significant findings. Lungs/Pleura: Mild centrilobular and paraseptal emphysema. Small bilateral pleural effusions Musculoskeletal: No chest wall abnormality. No acute osseous findings. CT ABDOMEN PELVIS FINDINGS Hepatobiliary: No solid liver abnormality is seen. No gallstones, gallbladder wall thickening, or biliary dilatation. Pancreas: Unremarkable. No pancreatic ductal dilatation or surrounding inflammatory changes. Spleen: Normal in size without significant abnormality. Adrenals/Urinary Tract: Adrenal glands are unremarkable. Kidneys are normal, without renal calculi, solid lesion, or hydronephrosis. Bladder is decompressed by Foley catheter. Stomach/Bowel: Stomach is within normal limits. Esophagogastric tube with tip in the proximal duodenal. Appendix appears normal. No evidence of bowel wall thickening, distention, or inflammatory changes. Vascular/Lymphatic: Aortic atherosclerosis. Infrarenal  abdominal aortic aneurysm measuring up to 4.2 x 3.5 cm in caliber (series 2, image 69).  No enlarged abdominal or pelvic lymph nodes. Reproductive: No mass or other abnormality. Other: Small fat containing inguinal hernias.  No ascites. Musculoskeletal: No acute osseous findings. IMPRESSION: 1.  Small bilateral pleural effusions. 2.  Small pericardial effusion. 3. Infrarenal abdominal aortic aneurysm measuring up to 4.2 x 3.5 cm in caliber. Recommend follow-up CT or MR as appropriate in 12 months and referral to or continued care with vascular specialist. (Ref.: J Vasc Surg. 2018; 67:2-77 and J Am Coll Radiol 2013;10(10):789-794.) 4.  Coronary artery disease. Aortic Atherosclerosis (ICD10-I70.0) and Emphysema (ICD10-J43.9). Electronically Signed   By: Jearld Lesch M.D.   On: 04/07/2023 17:50   DG Chest 1 View Result Date: 04/07/2023 CLINICAL DATA:  Hypoxia. EXAM: CHEST  1 VIEW COMPARISON:  April 06, 2023. FINDINGS: Stable cardiomegaly. Right lung is clear. Minimal left basilar subsegmental atelectasis is noted. Bony thorax is unremarkable. IMPRESSION: Minimal left basilar subsegmental atelectasis. Electronically Signed   By: Lupita Raider M.D.   On: 04/07/2023 13:14   DG Abd Portable 1V Result Date: 04/07/2023 CLINICAL DATA:  NG tube placement EXAM: PORTABLE ABDOMEN - 1 VIEW COMPARISON:  None Available. FINDINGS: Limited x-ray for tube placement has feeding tube with tip extending to the right mid abdomen, possibly distal stomach or proximal duodenum. There are air-filled loops of distended bowel throughout the mid abdomen including some mildly dilated loops. Please correlate with history. Enlarged heart. IMPRESSION: Limited x-ray for tube placement has feeding tube extending to the right side of the abdomen, possibly distal stomach or proximal duodenum Electronically Signed   By: Karen Kays M.D.   On: 04/07/2023 13:00   DG Chest 1 View Result Date: 04/06/2023 CLINICAL DATA:  Hypoxia. EXAM: CHEST  1 VIEW COMPARISON:  Radiograph 04/03/2023 FINDINGS: The heart is enlarged. Increase in  diffuse bilateral interstitial and airspace opacities. Suspected small pleural effusions, left greater than right. No pneumothorax. IMPRESSION: 1. Cardiomegaly with diffuse bilateral interstitial and airspace opacities, increased from prior. Findings may represent pulmonary edema or multifocal pneumonia. 2. Suspected small pleural effusions, left greater than right. Electronically Signed   By: Narda Rutherford M.D.   On: 04/06/2023 18:45      Assessment/Plan:  INTERVAL HISTORY:  Patient CT chest abdomen pelvis was unremarkable for infection.  Dr. Myna Hidalgo seen the patient for suspected multiple myeloma  Principal Problem:   Encephalopathy Active Problems:   Sepsis due to pneumonia (HCC)   AKI (acute kidney injury) (HCC)   Acute metabolic encephalopathy    Ronnie Dunn is a 63 y.o. male with past medical history significant for COPD obstructive sleep apnea heart failure hypertension paroxysmal atrial fibrillation tobacco abuse and alcohol abuse (she apparently was already attempting to cut back on so that his atrial fibrillation could be properly addressed by his cardiologist who roughly 2 weeks ago began to feel unwell per his wife who is at the bedside.  He was having episodes of hallucination and confusion, seeing things that were not there.  During this time.  He had no alcohol whatsoever and per the wife no alcohol for at least 2 weeks prior to admission.  When he was initially admitted to the hospital he was thought to possibly have pneumonia based on some interstitial opacities seen on chest x-ray his progressive encephalopathy in the hospital was then thought to potentially be due to alcohol withdrawal or other metabolic derangement or even potentially related to  hepatic encephalopathy.  Looking through the records there is no evidence of cirrhosis on imaging of the liver and he has no history of having suffered from hepatic encephalopathy esophageal varices or spontaneous  bacterial peritonitis.  When I saw the patient yesterday I thought that certainly an aspiration event could have occurred while he was antibiotic on antibiotics for community-acquired pneumonia and I agreed with broadening him to Zosyn as well as getting a CT chest abdomen pelvis the latter is completely normal.  Patient is now been evaluated by Dr. Myna Hidalgo due to concerns that he might have a plasmacytic disorder and Dr. Myna Hidalgo is fairly confident that he does indeed have this in with a high likelihood of multiple myeloma or Waldenstrm's  Certainly MM is known to cause fevers and might explain his ongoing fevers despite antibiotics   #1 FUO: I would ascribe them to likely being related to his presumed plasmacytic disease which may indeed be multiple myeloma we have found no targets for antibiotics based on blood cultures or imaging including CT chest abdomen and pelvis.  The patient is not hypotensive  and is at times hypertensive.  I do not see a role for antibiotics at this point in time and he is in the ICU where he is going to be closely monitored  #2 Encephalopathy: Unclear to me why he has this but would recommend MRI of the brain to look for potential metastasis to the brain for example  #3 Renal disease: This was discovered within the last several months and being worked up by his primary care physician I wonder also if he might have multiple myeloma affecting the kidney.  CRITICAL CARE Performed by: Paulette Blanch [place]   Total critical care time: 30 minutes  Critical care time was exclusive of separately billable procedures and treating other patients.  Critical care was necessary to treat or prevent imminent or life-threatening deterioration.  Critical care was time spent personally by me on the following activities: development of treatment plan with patient and/or surrogate as well as nursing, discussions with consultants, evaluation of patient's response to treatment,  examination of patient, obtaining history from patient or surrogate, ordering and performing treatments and interventions, ordering and review of laboratory studies, ordering and review of radiographic studies, pulse oximetry and re-evaluation of patient's condition.  Evaluation of the patient requires complex antimicrobial therapy evaluation, counseling , isolation needs to reduce disease transmission and risk assessment and mitigation.      LOS: 5 days   Acey Lav 04/08/2023, 2:12 PM

## 2023-04-08 NOTE — Progress Notes (Signed)
   Ceribell - per RN 2h will complete shortly after 6pm. D/ Dr Melynda Ripple  - No seizures  Plan  - keep ceribell till 2h complete -> then dc it - MRI afer ceribell    SIGNATURE    Dr. Kalman Shan, M.D., F.C.C.P,  Pulmonary and Critical Care Medicine Staff Physician, [facility] Center Director - Interstitial Lung Disease  Program  Pulmonary Fibrosis [facility] Network at [facility] [place], [place], [address]   Pager: [phone], If no answer  -> Check AMION or Try [phone] Telephone (clinical office): [phone] Telephone (research): [phone]  5:42 PM 04/08/2023

## 2023-04-09 ENCOUNTER — Encounter (HOSPITAL_COMMUNITY)

## 2023-04-09 ENCOUNTER — Inpatient Hospital Stay (HOSPITAL_COMMUNITY)

## 2023-04-09 DIAGNOSIS — R569 Unspecified convulsions: Secondary | ICD-10-CM

## 2023-04-09 DIAGNOSIS — D6869 Other thrombophilia: Secondary | ICD-10-CM

## 2023-04-09 LAB — GLUCOSE, CAPILLARY: Glucose-Capillary: 144 mg/dL — ABNORMAL HIGH (ref 70–99)

## 2023-04-09 LAB — RENAL FUNCTION PANEL
Albumin: 1.7 g/dL — ABNORMAL LOW (ref 3.5–5.0)
Anion gap: 10 (ref 5–15)
BUN: 60 mg/dL — ABNORMAL HIGH (ref 8–23)
CO2: 25 mmol/L (ref 22–32)
Calcium: 9.7 mg/dL (ref 8.9–10.3)
Chloride: 111 mmol/L (ref 98–111)
Creatinine, Ser: 2.4 mg/dL — ABNORMAL HIGH (ref 0.61–1.24)
GFR, Estimated: 30 mL/min — ABNORMAL LOW (ref >60)
Glucose, Bld: 139 mg/dL — ABNORMAL HIGH (ref 70–99)
Phosphorus: 3.1 mg/dL (ref 2.5–4.6)
Potassium: 3.1 mmol/L — ABNORMAL LOW (ref 3.5–5.1)
Sodium: 146 mmol/L — ABNORMAL HIGH (ref 135–145)

## 2023-04-09 LAB — MAGNESIUM
Magnesium: 1.8 mg/dL (ref 1.7–2.4)
Magnesium: 1.6 mg/dL — ABNORMAL LOW (ref 1.7–2.4)

## 2023-04-09 LAB — LACTIC ACID, PLASMA
Lactic Acid, Venous: 1.2 mmol/L (ref 0.5–1.9)
Lactic Acid, Venous: 1.3 mmol/L (ref 0.5–1.9)

## 2023-04-09 LAB — PHOSPHORUS
Phosphorus: 2.8 mg/dL (ref 2.5–4.6)
Phosphorus: 2.5 mg/dL (ref 2.5–4.6)

## 2023-04-09 LAB — AMMONIA: Ammonia: 63 umol/L — ABNORMAL HIGH (ref 9–35)

## 2023-04-09 MED ORDER — OSMOLITE 1.5 CAL PO LIQD
1000.0000 mL | ORAL | Status: DC
  Administered 2023-04-09 – 2023-04-12 (×3): 1000 mL
  Filled 2023-04-09 (×8): qty 1000

## 2023-04-09 MED ORDER — METOPROLOL TARTRATE 25 MG/10 ML ORAL SUSPENSION: 25.0000 mg | Freq: Two times a day (BID) | ORAL | Status: DC

## 2023-04-09 MED ORDER — HYDRALAZINE HCL 25 MG PO TABS
25.0000 mg | ORAL_TABLET | Freq: Three times a day (TID) | ORAL | Status: DC
  Administered 2023-04-09 – 2023-04-15 (×10): 25 mg
  Filled 2023-04-09 (×10): qty 1

## 2023-04-09 MED ORDER — ORAL CARE MOUTH RINSE: 15.0000 mL | OROMUCOSAL | Status: DC | PRN

## 2023-04-09 MED ORDER — DEXTROSE 5 % IV SOLN: INTRAVENOUS | Status: AC

## 2023-04-09 MED ORDER — POTASSIUM CHLORIDE 10 MEQ/100ML IV SOLN
10.0000 meq | INTRAVENOUS | Status: AC
  Administered 2023-04-09 (×4): 10 meq via INTRAVENOUS
  Filled 2023-04-09 (×4): qty 100

## 2023-04-09 MED ORDER — MAGNESIUM SULFATE 2 GM/50ML IV SOLN
2.0000 g | Freq: Once | INTRAVENOUS | Status: AC
  Administered 2023-04-09: 2 g via INTRAVENOUS
  Filled 2023-04-09: qty 50

## 2023-04-09 MED ORDER — LACTATED RINGERS IV SOLN: INTRAVENOUS | Status: DC

## 2023-04-09 MED ORDER — PROSOURCE TF20 ENFIT COMPATIBL EN LIQD
60.0000 mL | Freq: Two times a day (BID) | ENTERAL | Status: DC
  Administered 2023-04-09 – 2023-04-13 (×8): 60 mL
  Filled 2023-04-09 (×9): qty 60

## 2023-04-09 NOTE — Procedures (Signed)
 Patient Name: BRENIN HEIDELBERGER  MRN: [id number]  Epilepsy Attending: Charlsie Quest  Referring Physician/Provider: Kalman Shan, MD  Date: 04/08/2023 Duration: 2 Hours 48 mins  Patient history: 63yo M with ams. EEG to evaluate for seizure  Level of alertness: comatose/ lethargic  AEDs during EEG study: None  Technical aspects: This EEG was obtained using a 10 lead EEG system positioned circumferentially without any parasagittal coverage (rapid EEG). Computer selected EEG is reviewed as  well as background features and all clinically significant events.  Description: EEG showed continuous generalized 3 to 6 Hz theta-delta slowing admixed with 12-14hz  beta activity. Hyperventilation and photic stimulation were not performed.     ABNORMALITY - Continuous slow, generalized  IMPRESSION: This limited ceribell EEG is suggestive of moderate to severe diffuse encephalopathy. No seizures or epileptiform discharges were seen throughout the recording.  Johann Santone Annabelle Harman

## 2023-04-09 NOTE — Progress Notes (Signed)
 Patient transported to MRI via bed 04/08/23 at 2145. Transferred to MRI table and changed monitoring equipment to MRI-compatible system. Within 5 minutes of transfer, patient became restless and face color turned purple-gray. O2 sats dropped from 99 to 92 and steadily decreasing, with HR increased from 70s to 115. Patient was quickly transferred back to the ICU bed and HOB elevated to 30 degrees with improvement in color and O2 saturation. MRI procedure aborted due to inability to tolerate lying flat for the 20-minute procedure. [employer] RN notified as well as Anthoney Harada NP for [facility].   Cindy S. Clelia Croft BSN, RN, [employer], CCRN 04/09/2023 12:21 AM

## 2023-04-09 NOTE — Progress Notes (Signed)
   More awake with Precedex weaned.  Having bradycardia but it is only in the 50s.  And instructed nurse that we could handle bradycardia up to 45 as long as blood pressure is fine with Precedex.  Continues on 6 L nasal cannula.  CT chest reviewed.  Nothing acute.  Continue to monitor in the ICU.  Will need hematology input with viscosity and multiple myeloma workup ASAP.      SIGNATURE    Dr. Kalman Shan, M.D., F.C.C.P,  Pulmonary and Critical Care Medicine Staff Physician, [facility] Center Director - Interstitial Lung Disease  Program  Pulmonary Fibrosis [facility] Network at [facility] [place], [place], [address]   Pager: [phone], If no answer  -> Check AMION or Try [phone] Telephone (clinical office): [phone] Telephone (research): [phone]  5:39 PM 04/09/2023

## 2023-04-09 NOTE — Progress Notes (Signed)
       Date: 04/09/2023  Patient name: Ronnie Dunn  Medical record number: [id number]  Date of birth: [birth year]    I continue to NOT suspect a bacterial or infectious disease as being causative agent here and suspect multiple  myeloma or other hematological conditoin is causing his encephalopathy and metabolic derangements.  I will sign off for now but please do not hesitate to bring [place] back into his case if the need arises. I think the MRI of the brain when it can be done will be helpful     Acey Lav 04/09/2023, 4:15 PM

## 2023-04-09 NOTE — Progress Notes (Signed)
 PROGRESS NOTE    KONSTANTINE Dunn  FAO:[id number] DOB: [birth year] DOA: 04/03/2023 PCP: [name], MD   Brief Narrative: 63 yo male  with past medical history significant for COPD, chronic diastolic congestive heart failure, HTN, HLD, paroxysmal atrial fibrillation on anticoagulation with Xarelto, GERD, OSA, tobacco use disorder, alcohol use disorder, obesity who presented to [facility] ED from home via EMS on 04/03/2022 with complaints of productive cough, fever/chills, nausea, progressive weakness/fatigue and shortness of breath over the last 2 weeks.  Spouse also noted that he was confused, not wanting to eat or drink anything.  Also reported to be hypothermic with temperature 94.0 F.  Also has had complaints of low back pain for past two weeks which is new,no falls reported    Sputum thick, green in appearance.  Recently completed a course of azithromycin prescribed by his pulmonologist, [name] which did not improve his symptoms.  Given progression, confusion spouse called EMS and patient was brought to the ED for further evaluation management.   In the ED, temperature 99.4 F, HR 65, RR 23, BP 145/90, SpO2 88% on room air.  WBC 8.1, hemoglobin 11.8, platelet count 337.  Sodium 134, potassium 2.6, chloride 89, CO2 33, glucose 113, BUN 20, creatinine 2.00.  Lipase 27, AST 18, ALT less than 5, total bilirubin 0.8.  Ammonia level 24.  BNP 706.8.  Lactic acid 1.7.  INR 1.9.  COVID/influenza/RSV PCR negative.  TSH 3.591.  Urinalysis unrevealing.  Chest x-ray with increased bilateral interstitial and diffuse patchy opacities likely pulmonary edema, blunting of the costophrenic angles which may represent small pleural effusions, noted cardiomegaly.  Patient was given 2 L LR bolus, started on vancomycin, cefepime and metronidazole by EDP.  TRH consulted for admission for further evaluation management of acute hypoxic respiratory failure likely secondary to community-acquired  pneumonia.  Assessment & Plan:   Principal Problem:   Encephalopathy Active Problems:   Sepsis due to pneumonia (HCC)   AKI (acute kidney injury) (HCC)   Acute metabolic encephalopathy  FUO  COVID RSV and influenza were negative temperature was 99.4 white count 8.1 and lactic acid 1.7.  Initially started on vancomycin and cefepime.  Vancomycin stopped as with MRSA PCR was negative, cefepime was stopped 04/05/2023 due to change in mental status, Rocephin started 04/05/2023.  Patient was transferred to stepdown on 3/21 due to respiratory distress  PCCM started zosyn and ID was consulted CT chest abdomen and pelvis did not reveal any evidence of acute infection. Antibiotics were stopped 3/22   Rule out multiple myeloma/Waldenstrm's macroglobulinemia appreciate oncology input.  SPEP UPEP pending  Kappa lambda chains ordered Apparently lab cannot process some of these labs  ?  Due to high viscosity  A-fib RVR has history of atrial fibrillation on Xarelto and beta-blockers continue  Hypokalemia -3.1 continue to replete  Hyponatremia fluids changed to hypotonic fluids continue slow rate  COPD Not oxygen dependent at baseline.  Follows with pulmonology outpatient [name]. On Trelegy Ellipta at baseline   Low back pain for the last 2 weeks per wife MRI lumbar spine -lumbar spondylosis, moderate bilateral neuroforaminal stenosis at L5-S1, mild spinal canal stenosis, moderate right lateral recess stenosis at L4-L5  Metabolic encephalopathy likely multifactorial -hypoxic respiratory failure/hyperammonia/EtOH /hypercalcemia/?  Seizures- wife reports he is more confused which is not his baseline he is hallucinating UA on admission was negative for UTI CT head remote left cerebellar infarct TSH normal  Ammonia 63 from 67 up from 55 core track  placed in stepdown for lactulose Continue lactulose Cerebel eeg moderate encephalopathy no seizure activity is noted  For EEG tomorrow  MRI brain  could not be done as patient became hypoxic with spine  Chronic diastolic congestive heart failure, decompensated Essential hypertension Home regimen includes metoprolol succinate 50 mg p.o. daily, hydralazine 50 mg p.o. 3 times daily, spironolactone 25 mg p.o. daily, lisinopril 5 mg p.o. daily, furosemide 120 mg p.o. daily.  Patient with elevated BNP 706.8 and with chest x-ray findings also concerning for pulmonary edema.-Chest x-ray shows fluid overload lasix currently on hold   Paroxysmal atrial fibrillation -- Metoprolol succinate 50 mg p.o. daily -- Continue Xarelto   GERD -- Protonix 40 mg p.o. daily   HLD -- Atorvastatin 10 mg p.o. daily   Tobacco use disorder EtOH use disorder Counsel need for complete cessation/abstinence. --Nicotine patch   Obesity, class I Body mass index is 31.53 kg/m.    OSA --Continue nocturnal CPAP   Moderate hypercalcemia -status post Zometa  Calcitonin unclear etiology ?malignancy?myeloma-phosphorus 2.8, vitamin D 73.8 Await PTH SPEP UPEP  CT chest suspicious for multiple myeloma with scattered lytic lesions small pericardial effusion similar left lobe collapse consolidation, pulmonary artery hypertension.  AKI creatinine improvement with slow IV fluids.  ultrasound of the kidneys showed no hydronephrosis.  Appreciate nephrology, gentle IV fluids.  DVT prophylaxis:  rivaroxaban (XARELTO) tablet 20 mg    Code Status: Full Code Family Communication: dw wife    Estimated body mass index is 30.06 kg/m as calculated from the following:   Height as of this encounter: 6\' 2"  (1.88 m).   Weight as of this encounter: 106.2 kg.  DVT prophylaxis:xarelto Code Status:full Family Communication:dw wife and daughter on the phone Disposition Plan:  Status is: Inpatient Remains inpatient appropriate because: acute illness   Consultants: none  Procedures:none  Antimicrobials:none Subjective: Restless opens eyes looks at me when I called his  name nods head up and down and responds to my questions which is new since yesterday Still on 6 L HFNC objective: Vitals:   04/09/23 0956 04/09/23 0958 04/09/23 1031 04/09/23 1253  BP: 125/83 125/83    Pulse:  86    Resp:      Temp:    98 F (36.7 C)  TempSrc:    Oral  SpO2:   93%   Weight:      Height:        Intake/Output Summary (Last 24 hours) at 04/09/2023 1327 Last data filed at 04/09/2023 1206 Gross per 24 hour  Intake 4381.73 ml  Output 725 ml  Net 3656.73 ml   Filed Weights   04/07/23 0500 04/08/23 0100 04/09/23 0406  Weight: 103.9 kg 106.1 kg 106.2 kg    Examination:  General exam: Seen in mittens Respiratory system: Rhonchi to auscultation.  Tachypneic cardiovascular system: Irregular tachycardic gastrointestinal system: Abdomen is nondistended, soft and nontender. No organomegaly or masses felt. Normal bowel sounds heard. Central nervous system: mittens in place Extremities: Edema  Data Reviewed: I have personally reviewed following labs and imaging studies  CBC: Recent Labs  Lab 04/03/23 1118 04/04/23 0244 04/05/23 0735 04/07/23 1251 04/08/23 0830  WBC 8.1 7.1 7.9 9.8 8.9  NEUTROABS 5.9  --   --   --   --   HGB 11.8* 11.2* 11.8* 13.1 12.2*  HCT 38.7* 36.5* 37.7* 43.4 39.2  MCV 100.8* 100.0 98.4 100.7* 99.2  PLT 337 318 346 363 289   Basic Metabolic Panel: Recent Labs  Lab 04/04/23 0244  04/05/23 0735 04/06/23 0515 04/06/23 0948 04/07/23 0508 04/08/23 0311 04/08/23 0830 04/09/23 0555  NA 135 136 137 142 145  --  146* 146*  K 2.4* 2.8* 2.6* 2.8* 3.2*  --  2.7* 3.1*  CL 93* 96* 96* 100 107  --  108 111  CO2 30 28 29 25 23   --  26 25  GLUCOSE 96 108* 128* 120* 120*  --  123* 139*  BUN 21 30* 32* 33* 43*  --  58* 60*  CREATININE 1.81* 1.97* 1.99* 2.10* 2.37*  --  2.91* 2.40*  CALCIUM 11.2* 11.8* 11.8* 12.3* 12.2*  --  11.0* 9.7  MG 1.3* 1.9  --   --   --  1.8  --   --   PHOS  --  2.0*  --  3.9  --   --  2.5 3.1   GFR: Estimated  Creatinine Clearance: 40.9 mL/min (A) (by C-G formula based on SCr of 2.4 mg/dL (H)). Liver Function Tests: Recent Labs  Lab 04/03/23 1118 04/06/23 0948 04/07/23 0508 04/08/23 0830 04/09/23 0555  AST 18 27 26 22   --   ALT 5 5 9 7   --   ALKPHOS 70 70 72 77  --   BILITOT 0.8 0.8 1.1 1.0  --   PROT 10.7* 11.1* 11.2* 10.8*  --   ALBUMIN 2.1* 2.0* 2.2* 1.8* 1.7*   Recent Labs  Lab 04/03/23 1118  LIPASE 27   Recent Labs  Lab 04/03/23 1118 04/06/23 0515 04/07/23 0601 04/08/23 1852 04/09/23 0555  AMMONIA 24 55* 67* 61* 63*   Coagulation Profile: Recent Labs  Lab 04/03/23 1118  INR 1.9*   Cardiac Enzymes: No results for input(s): "CKTOTAL", "CKMB", "CKMBINDEX", "TROPONINI" in the last 168 hours. BNP (last 3 results) No results for input(s): "PROBNP" in the last 8760 hours. HbA1C: No results for input(s): "HGBA1C" in the last 72 hours. CBG: Recent Labs  Lab 04/03/23 1712 04/06/23 1514 04/07/23 1149 04/07/23 2054  GLUCAP 133* 119* 118* 127*   Lipid Profile: No results for input(s): "CHOL", "HDL", "LDLCALC", "TRIG", "CHOLHDL", "LDLDIRECT" in the last 72 hours. Thyroid Function Tests: No results for input(s): "TSH", "T4TOTAL", "FREET4", "T3FREE", "THYROIDAB" in the last 72 hours.  Anemia Panel: Recent Labs    04/06/23 2352  FOLATE >40.0   Sepsis Labs: Recent Labs  Lab 04/07/23 1315 04/07/23 1552 04/08/23 2359 04/09/23 0555  PROCALCITON 1.34  --   --   --   LATICACIDVEN 2.0* 2.4* 1.3 1.2    Recent Results (from the past 240 hours)  Resp panel by RT-PCR (RSV, Flu A&B, Covid) Anterior Nasal Swab     Status: None   Collection Time: 04/03/23 11:18 AM   Specimen: Anterior Nasal Swab  Result Value Ref Range Status   SARS Coronavirus 2 by RT PCR NEGATIVE NEGATIVE Final    Comment: (NOTE) SARS-CoV-2 target nucleic acids are NOT DETECTED.  The SARS-CoV-2 RNA is generally detectable in upper respiratory specimens during the acute phase of infection. The  lowest concentration of SARS-CoV-2 viral copies this assay can detect is 138 copies/mL. A negative result does not preclude SARS-Cov-2 infection and should not be used as the sole basis for treatment or other patient management decisions. A negative result may occur with  improper specimen collection/handling, submission of specimen other than nasopharyngeal swab, presence of viral mutation(s) within the areas targeted by this assay, and inadequate number of viral copies(<138 copies/mL). A negative result must be combined with clinical observations, patient history,  and epidemiological information. The expected result is Negative.  Fact Sheet for Patients:  BloggerCourse.com  Fact Sheet for Healthcare Providers:  SeriousBroker.it  This test is no t yet approved or cleared by the [place] FDA and  has been authorized for detection and/or diagnosis of SARS-CoV-2 by FDA under an Emergency Use Authorization (EUA). This EUA will remain  in effect (meaning this test can be used) for the duration of the COVID-19 declaration under Section 564(b)(1) of the Act, 21 U.S.C.section 360bbb-3(b)(1), unless the authorization is terminated  or revoked sooner.       Influenza A by PCR NEGATIVE NEGATIVE Final   Influenza B by PCR NEGATIVE NEGATIVE Final    Comment: (NOTE) The Xpert Xpress SARS-CoV-2/FLU/RSV plus assay is intended as an aid in the diagnosis of influenza from Nasopharyngeal swab specimens and should not be used as a sole basis for treatment. Nasal washings and aspirates are unacceptable for Xpert Xpress SARS-CoV-2/FLU/RSV testing.  Fact Sheet for Patients: BloggerCourse.com  Fact Sheet for Healthcare Providers: SeriousBroker.it  This test is not yet approved or cleared by the [place] FDA and has been authorized for detection and/or diagnosis of SARS-CoV-2 by FDA under  an Emergency Use Authorization (EUA). This EUA will remain in effect (meaning this test can be used) for the duration of the COVID-19 declaration under Section 564(b)(1) of the Act, 21 U.S.C. section 360bbb-3(b)(1), unless the authorization is terminated or revoked.     Resp Syncytial Virus by PCR NEGATIVE NEGATIVE Final    Comment: (NOTE) Fact Sheet for Patients: BloggerCourse.com  Fact Sheet for Healthcare Providers: SeriousBroker.it  This test is not yet approved or cleared by the [place] FDA and has been authorized for detection and/or diagnosis of SARS-CoV-2 by FDA under an Emergency Use Authorization (EUA). This EUA will remain in effect (meaning this test can be used) for the duration of the COVID-19 declaration under Section 564(b)(1) of the Act, 21 U.S.C. section 360bbb-3(b)(1), unless the authorization is terminated or revoked.  Performed at [facility], [address], [place], [place] [address]   Blood Culture (routine x 2)     Status: None   Collection Time: 04/03/23 11:18 AM   Specimen: BLOOD  Result Value Ref Range Status   Specimen Description   Final    BLOOD LEFT ANTECUBITAL Performed at [facility], [address] [place], [place] [address]    Special Requests   Final    BOTTLES DRAWN AEROBIC AND ANAEROBIC Blood Culture adequate volume Performed at [facility], [address] [place], [place] [address]    Culture   Final    NO GROWTH 5 DAYS Performed at [facility], [address], [place], [place] [address]    Report Status 04/08/2023 FINAL  Final  Blood Culture (routine x 2)     Status: None   Collection Time: 04/03/23 11:18 AM   Specimen: BLOOD RIGHT WRIST  Result Value Ref Range Status   Specimen Description   Final    BLOOD RIGHT WRIST Performed at [facility], [address], [place], [place] [address]    Special  Requests   Final    BOTTLES DRAWN AEROBIC AND ANAEROBIC Blood Culture adequate volume Performed at [facility], [address] [place], [place] [address]    Culture   Final    NO GROWTH 5 DAYS Performed at [facility], [address] [place], [place] [address]  Report Status 04/08/2023 FINAL  Final  MRSA Next Gen by PCR, Nasal     Status: None   Collection Time: 04/03/23  5:17 PM   Specimen: Nasal Mucosa; Nasal Swab  Result Value Ref Range Status   MRSA by PCR Next Gen NOT DETECTED NOT DETECTED Final    Comment: (NOTE) The GeneXpert MRSA Assay (FDA approved for NASAL specimens only), is one component of a comprehensive MRSA colonization surveillance program. It is not intended to diagnose MRSA infection nor to guide or monitor treatment for MRSA infections. Test performance is not FDA approved in patients less than 13 years old. Performed at [facility], [address] [place], [place] [address]   Culture, blood (Routine X 2) w Reflex to ID Panel     Status: None (Preliminary result)   Collection Time: 04/07/23  1:15 PM   Specimen: BLOOD LEFT HAND  Result Value Ref Range Status   Specimen Description   Final    BLOOD LEFT HAND Performed at [facility], [address], [place], [place] [address]    Special Requests   Final    BOTTLES DRAWN AEROBIC ONLY Blood Culture adequate volume Performed at [facility], [address], [place], [place] [address]    Culture   Final    NO GROWTH 2 DAYS Performed at [facility], [address], [place], [place] [address]    Report Status PENDING  Incomplete  Culture, blood (Routine X 2) w Reflex to ID Panel     Status: None (Preliminary result)   Collection Time: 04/07/23  2:08 PM   Specimen: BLOOD RIGHT ARM  Result Value Ref Range Status   Specimen Description   Final    BLOOD RIGHT ARM Performed at [facility], [address] [place],  [place] [address]    Special Requests   Final    BOTTLES DRAWN AEROBIC AND ANAEROBIC Blood Culture results may not be optimal due to an inadequate volume of blood received in culture bottles Performed at [facility], [address], [place], [place] [address]    Culture   Final    NO GROWTH 2 DAYS Performed at [facility], [address], [place], [place] [address]    Report Status PENDING  Incomplete         Radiology Studies: Rapid EEG Result Date: 04/09/2023 Charlsie Quest, MD     04/09/2023  8:41 AM Patient Name: Ronnie Dunn MRN: [id number] Epilepsy Attending: Charlsie Quest Referring Physician/Provider: Kalman Shan, MD Date: 04/08/2023 Duration: 2 Hours 48 mins Patient history: 63yo M with ams. EEG to evaluate for seizure Level of alertness: comatose/ lethargic AEDs during EEG study: None Technical aspects: This EEG was obtained using a 10 lead EEG system positioned circumferentially without any parasagittal coverage (rapid EEG). Computer selected EEG is reviewed as  well as background features and all clinically significant events. Description: EEG showed continuous generalized 3 to 6 Hz theta-delta slowing admixed with 12-14hz  beta activity. Hyperventilation and photic stimulation were not performed.   ABNORMALITY - Continuous slow, generalized IMPRESSION: This limited ceribell EEG is suggestive of moderate to severe diffuse encephalopathy. No seizures or epileptiform discharges were seen throughout the recording. Charlsie Quest   DG CHEST PORT 1 VIEW Result Date: 04/08/2023 CLINICAL DATA:  Follow-up pneumothorax EXAM: PORTABLE CHEST 1 VIEW COMPARISON:  04/07/2023 FINDINGS: Cardiac shadow is enlarged but stable. Feeding catheter is noted extending  towards the stomach. The lungs are well aerated bilaterally. No focal pneumothorax or effusion is seen. Mild central vascular congestion is noted. No bony abnormality is seen. IMPRESSION: Mild vascular congestion  without significant edema. No evidence of pneumothorax. Electronically Signed   By: Alcide Clever M.D.   On: 04/08/2023 22:33   ECHOCARDIOGRAM COMPLETE Result Date: 04/08/2023    ECHOCARDIOGRAM REPORT   Patient Name:   IYAD DEROO Date of Exam: 04/08/2023 Medical Rec #:  [id number]     Height:       74.0 in Accession #:    [id number]    Weight:       233.9 lb Date of Birth:  1960/12/12     BSA:          2.323 m Patient Age:    62 years      BP:           183/116 mmHg Patient Gender: M             HR:           100 bpm. Exam Location:  Inpatient Procedure: 2D Echo, Cardiac Doppler and Color Doppler (Both Spectral and Color            Flow Doppler were utilized during procedure). Indications:    Acute Respiratory Distress  History:        Patient has prior history of Echocardiogram examinations, most                 recent 07/22/2022. Risk Factors:Hypertension, Diabetes, Current                 Smoker and Sleep Apnea.  Sonographer:    Karma Ganja Referring Phys: 20 MURALI RAMASWAMY  Sonographer Comments: Technically difficult study due to poor echo windows. IMPRESSIONS  1. Left ventricular ejection fraction, by estimation, is 55 to 60%. The left ventricle has normal function. The left ventricle has no regional wall motion abnormalities. There is mild left ventricular hypertrophy. Left ventricular diastolic parameters are indeterminate.  2. Right ventricular systolic function is moderately reduced. The right ventricular size is mildly enlarged. Mildly increased right ventricular wall thickness. There is normal pulmonary artery systolic pressure. The estimated right ventricular systolic pressure is 33.2 mmHg.  3. Left atrial size was mildly dilated.  4. Right atrial size was severely dilated.  5. A small pericardial effusion is present.  6. The mitral valve is normal in structure. No evidence of mitral valve regurgitation. No evidence of mitral stenosis.  7. The aortic valve was not well visualized. Aortic valve  regurgitation is not visualized. No aortic stenosis is present.  8. The inferior vena cava is dilated in size with >50% respiratory variability, suggesting right atrial pressure of 8 mmHg. Comparison(s): A prior study was performed on 06/22/2021. RV is dilated and reduced function compared to prior study. FINDINGS  Left Ventricle: Left ventricular ejection fraction, by estimation, is 55 to 60%. The left ventricle has normal function. The left ventricle has no regional wall motion abnormalities. The left ventricular internal cavity size was normal in size. There is  mild left ventricular hypertrophy. Left ventricular diastolic parameters are indeterminate. Right Ventricle: The right ventricular size is mildly enlarged. Mildly increased right ventricular wall thickness. Right ventricular systolic function is moderately reduced. There is normal pulmonary artery systolic pressure. The tricuspid regurgitant velocity is 2.51 m/s, and with an assumed right atrial pressure of 8 mmHg, the estimated right ventricular systolic pressure is 33.2 mmHg. Left  Atrium: Left atrial size was mildly dilated. Right Atrium: Right atrial size was severely dilated. Pericardium: A small pericardial effusion is present. Mitral Valve: The mitral valve is normal in structure. No evidence of mitral valve regurgitation. No evidence of mitral valve stenosis. Tricuspid Valve: The tricuspid valve is normal in structure. Tricuspid valve regurgitation is mild . No evidence of tricuspid stenosis. Aortic Valve: The aortic valve was not well visualized. Aortic valve regurgitation is not visualized. No aortic stenosis is present. Aortic valve mean gradient measures 3.0 mmHg. Aortic valve peak gradient measures 5.4 mmHg. Aortic valve area, by VTI measures 3.07 cm. Pulmonic Valve: The pulmonic valve was normal in structure. Pulmonic valve regurgitation is not visualized. No evidence of pulmonic stenosis. Aorta: The aortic root is normal in size and  structure. Venous: The inferior vena cava is dilated in size with greater than 50% respiratory variability, suggesting right atrial pressure of 8 mmHg. IAS/Shunts: The interatrial septum was not well visualized.  LEFT VENTRICLE PLAX 2D LVIDd:         5.20 cm   Diastology LVIDs:         3.90 cm   LV e' medial:    8.45 cm/s LV PW:         1.20 cm   LV E/e' medial:  10.5 LV IVS:        1.20 cm   LV e' lateral:   6.82 cm/s LVOT diam:     2.20 cm   LV E/e' lateral: 13.0 LV SV:         51 LV SV Index:   22 LVOT Area:     3.80 cm  RIGHT VENTRICLE            IVC RV Basal diam:  4.50 cm    IVC diam: 2.40 cm RV S prime:     9.28 cm/s TAPSE (M-mode): 0.8 cm LEFT ATRIUM              Index        RIGHT ATRIUM           Index LA diam:        4.70 cm  2.02 cm/m   RA Area:     33.90 cm LA Vol (A2C):   111.0 ml 47.78 ml/m  RA Volume:   139.00 ml 59.83 ml/m LA Vol (A4C):   59.2 ml  25.48 ml/m LA Biplane Vol: 87.3 ml  37.58 ml/m  AORTIC VALVE AV Area (Vmax):    2.93 cm AV Area (Vmean):   2.71 cm AV Area (VTI):     3.07 cm AV Vmax:           116.33 cm/s AV Vmean:          77.367 cm/s AV VTI:            0.166 m AV Peak Grad:      5.4 mmHg AV Mean Grad:      3.0 mmHg LVOT Vmax:         89.62 cm/s LVOT Vmean:        55.080 cm/s LVOT VTI:          0.134 m LVOT/AV VTI ratio: 0.81  AORTA Ao Root diam: 3.90 cm MITRAL VALVE               TRICUSPID VALVE MV Area (PHT): 3.78 cm    TR Peak grad:   25.2 mmHg MV Decel Time: 201 msec    TR Vmax:  251.00 cm/s MV E velocity: 88.43 cm/s                            SHUNTS                            Systemic VTI:  0.13 m                            Systemic Diam: 2.20 cm Sunit Tolia Electronically signed by Tessa Lerner Signature Date/Time: 04/08/2023/2:44:11 PM    Final    CT CHEST ABDOMEN PELVIS WO CONTRAST Result Date: 04/07/2023 CLINICAL DATA:  Sepsis EXAM: CT CHEST, ABDOMEN AND PELVIS WITHOUT CONTRAST TECHNIQUE: Multidetector CT imaging of the chest, abdomen and pelvis was performed  following the standard protocol without IV contrast. RADIATION DOSE REDUCTION: This exam was performed according to the departmental dose-optimization program which includes automated exposure control, adjustment of the mA and/or kV according to patient size and/or use of iterative reconstruction technique. COMPARISON:  None Available. FINDINGS: CT CHEST FINDINGS Cardiovascular: Aortic atherosclerosis. Cardiomegaly. Three-vessel coronary artery calcifications. Small pericardial effusion. Mediastinum/Nodes: No enlarged mediastinal, hilar, or axillary lymph nodes. Thyroid gland, trachea, and esophagus demonstrate no significant findings. Lungs/Pleura: Mild centrilobular and paraseptal emphysema. Small bilateral pleural effusions Musculoskeletal: No chest wall abnormality. No acute osseous findings. CT ABDOMEN PELVIS FINDINGS Hepatobiliary: No solid liver abnormality is seen. No gallstones, gallbladder wall thickening, or biliary dilatation. Pancreas: Unremarkable. No pancreatic ductal dilatation or surrounding inflammatory changes. Spleen: Normal in size without significant abnormality. Adrenals/Urinary Tract: Adrenal glands are unremarkable. Kidneys are normal, without renal calculi, solid lesion, or hydronephrosis. Bladder is decompressed by Foley catheter. Stomach/Bowel: Stomach is within normal limits. Esophagogastric tube with tip in the proximal duodenal. Appendix appears normal. No evidence of bowel wall thickening, distention, or inflammatory changes. Vascular/Lymphatic: Aortic atherosclerosis. Infrarenal abdominal aortic aneurysm measuring up to 4.2 x 3.5 cm in caliber (series 2, image 69). No enlarged abdominal or pelvic lymph nodes. Reproductive: No mass or other abnormality. Other: Small fat containing inguinal hernias.  No ascites. Musculoskeletal: No acute osseous findings. IMPRESSION: 1.  Small bilateral pleural effusions. 2.  Small pericardial effusion. 3. Infrarenal abdominal aortic aneurysm measuring  up to 4.2 x 3.5 cm in caliber. Recommend follow-up CT or MR as appropriate in 12 months and referral to or continued care with vascular specialist. (Ref.: J Vasc Surg. 2018; 67:2-77 and J Am Coll Radiol 2013;10(10):789-794.) 4.  Coronary artery disease. Aortic Atherosclerosis (ICD10-I70.0) and Emphysema (ICD10-J43.9). Electronically Signed   By: Jearld Lesch M.D.   On: 04/07/2023 17:50   Scheduled Meds:  arformoterol  15 mcg Nebulization BID   budesonide (PULMICORT) nebulizer solution  0.5 mg Nebulization BID   Chlorhexidine Gluconate Cloth  6 each Topical Daily   famotidine  40 mg Per Tube Daily   feeding supplement (PROSource TF20)  60 mL Per Tube BID   folic acid  1 mg Per Tube Daily   hydrALAZINE  25 mg Per Tube TID   lactulose  30 g Per Tube BID   multivitamin  15 mL Per Tube Daily   nicotine  21 mg Transdermal Daily   pantoprazole (PROTONIX) IV  40 mg Intravenous Q24H   revefenacin  175 mcg Nebulization Daily   rivaroxaban  15 mg Per Tube Q supper   thiamine  100 mg Oral Daily   Or  thiamine  100 mg Intravenous Daily   Continuous Infusions:  dexmedetomidine (PRECEDEX) IV infusion 0.7 mcg/kg/hr (04/09/23 1206)   dextrose 85 mL/hr at 04/09/23 1207   feeding supplement (OSMOLITE 1.5 CAL)     lactated ringers 65 mL/hr at 04/09/23 1206     LOS: 6 days   Time spent: 35 min  Alwyn Ren, MD  04/09/2023, 1:27 PM

## 2023-04-09 NOTE — Plan of Care (Signed)
  Problem: Education: Goal: Knowledge of General Education information will improve Description: Including pain rating scale, medication(s)/side effects and non-pharmacologic comfort measures Outcome: Progressing   Problem: Health Behavior/Discharge Planning: Goal: Ability to manage health-related needs will improve Outcome: Progressing   Problem: Clinical Measurements: Goal: Ability to maintain clinical measurements within normal limits will improve Outcome: Progressing Goal: Will remain free from infection Outcome: Progressing Goal: Diagnostic test results will improve Outcome: Progressing Goal: Respiratory complications will improve Outcome: Progressing Goal: Cardiovascular complication will be avoided Outcome: Progressing   Problem: Activity: Goal: Risk for activity intolerance will decrease Outcome: Progressing   Problem: Nutrition: Goal: Adequate nutrition will be maintained Outcome: Progressing   Problem: Coping: Goal: Level of anxiety will decrease Outcome: Progressing   Problem: Elimination: Goal: Will not experience complications related to bowel motility Outcome: Progressing Goal: Will not experience complications related to urinary retention Outcome: Progressing   Problem: Pain Managment: Goal: General experience of comfort will improve and/or be controlled Outcome: Progressing   Problem: Safety: Goal: Ability to remain free from injury will improve Outcome: Progressing   Problem: Skin Integrity: Goal: Risk for impaired skin integrity will decrease Outcome: Progressing   Problem: Education: Goal: Ability to demonstrate management of disease process will improve Outcome: Progressing Goal: Ability to verbalize understanding of medication therapies will improve Outcome: Progressing   Problem: Activity: Goal: Capacity to carry out activities will improve Outcome: Progressing   Problem: Cardiac: Goal: Ability to achieve and maintain adequate  cardiopulmonary perfusion will improve Outcome: Progressing   Cindy S. Clelia Croft BSN, RN, [employer], CCRN 04/09/2023 3:11 AM

## 2023-04-09 NOTE — Progress Notes (Signed)
 eLink Physician-Brief Progress Note Patient Name: Ronnie Dunn DOB: 1960/03/07 MRN: [id number]   Date of Service  04/09/2023  HPI/Events of Note  Patient has been having ongoing delirium-mostly hyperactive delirium.  Has been on Precedex infusion but has intermittent heart rates into the 30s with pauses although this is nonsustained.  Has Ativan and Haldol on board, but given that he remains deliriogenic, benzos are less preferred.  Asking for clarity about how to manage his ongoing delirium  eICU Interventions  Maintain Precedex and continue to utilize this as a maintenance med-okay with heart rates in the 40s sustained as long as remaining hemodynamics are stable.  Utilize Haldol as needed for breakthrough agitated delirium.  Discontinue Ativan.     Intervention Category Minor Interventions: Agitation / anxiety - evaluation and management  [name] 04/09/2023, 9:56 PM

## 2023-04-09 NOTE — Progress Notes (Signed)
    Patient Name: Ronnie Dunn           DOB: 05-31-1960  MRN: [id number]      Admission Date: 04/03/2023  Attending Provider: Alwyn Ren, MD  Primary Diagnosis: Encephalopathy   Level of care: Stepdown    CROSS COVER NOTE   Date of Service   04/09/2023   Ronnie Dunn, 63 y.o. male, was admitted on 04/03/2023 for Encephalopathy.    HPI/Events of Note   Bedside RN concerned with hypotension despite patient receiving 500 cc fluid bolus earlier tonight. Additional 500 cc fluid bolus ordered.  Patient also having low-grade fever, lactic acid ordered.   Interventions/ Plan   Patient has received a total of 1 L fluid bolus tonight Lactic acid-negative        Anthoney Harada, DNP, [employer] [facility] [place]

## 2023-04-09 NOTE — Progress Notes (Addendum)
 NAME:  [name], MRN:  [id number], DOB:  04-30-1960, LOS: 6 ADMISSION DATE:  04/03/2023, CONSULTATION DATE:  04/07/23 REFERRING MD:  Ronnie Dunn CHIEF COMPLAINT:  AMS   BRIEF  Ronnie Dunn is a 63 y.o. male who has a PMH as below including but not limited to COPD, OSA on CPAP, chronic diastolic CHF, HTN, HLD, PAF on Xarelto, tobacco dependence, alcohol use.  He is followed by Ronnie Dunn as an outpatient. He presented to [facility] ED on 3/17 with cough, fever/chills, nausea, weakness, fatigue, dyspnea x 2 weeks.  He had some confusion on and off and decreased appetite.  He had also complained of low back pain which was new and was not associated with any recent fall or trauma.  He had productive sputum, green in color.  He recently completed a course of azithromycin as an outpatient but had no relief with this.  Due to ongoing symptoms, he presented to the ED for further workup.  Initial chest x-ray showed bilateral interstitial opacities.  He was started on IV Lasix.  Renal function worsened, he had renal ultrasound on 3/19 that showed normal kidneys.  Follow-up chest x-ray showed cardiomegaly and ongoing interstitial opacities with small pleural effusions.  Diuresis was continued; however, renal function worsened further.  Was initially on vancomycin and cefepime however cefepime was stopped due to altered mental status.  This was switched to ceftriaxone on 3/19.  Overnight 3/20, he developed A-fib RVR.  He received Lopressor with improvement in heart rate.  Early a.m. 3/21 he had altered mental status and new fever to 102.9 rectally.  He was subsequently transferred to the ICU and PCCM and infectious disease were asked to see in consultation.  Upon arrival to the ICU, he is minimally responsive but is protecting his airway.  He has heart rates in the 110s to 120s, blood pressure stable.  Mucous membranes are very dry.  ABG was obtained and was normal (7.52/41/21). He has clear urine in his Foley.   Antibiotics were escalated from ceftriaxone to Zosyn.  Infectious disease has seen him and we have agreed to assess CT chest/abdomen/pelvis.  Pertinent  Medical History:  has Seizure (HCC); Hyponatremia; Alcohol use disorder, moderate, dependence (HCC); Class 1 obesity; Essential hypertension; Type 2 diabetes mellitus without complication, without long-term current use of insulin (HCC); Chronic obstructive pulmonary disease (HCC); Gastroesophageal reflux disease; Lower extremity edema; Permanent atrial fibrillation (HCC); OSA (obstructive sleep apnea); Chronic heart failure with preserved ejection fraction (HCC); Seizures (HCC); Protein-calorie malnutrition, moderate (HCC); Hypomagnesemia; Alcohol abuse; Obesity (BMI 30-39.9); Noncompliance with medication regimen; Aortic root dilation (HCC); Acquired thrombophilia (HCC); Sepsis due to pneumonia (HCC); AKI (acute kidney injury) (HCC); Acute metabolic encephalopathy; and Encephalopathy on their problem list.  Significant Hospital Events: Including procedures, antibiotic start and stop dates in addition to other pertinent events   3/17 admit 3.19 - BNPP 800 3/20 - HFNC O2 need 3/21 - Altered but protecting airway. Vitals stable.Marland Kitchen 7L [place] O2 need. BN 275 (down from 813)  - ID Consult: Fever, sepsis of unknown source--potentially due to aspiration in context of encephalopathy. Alcoholic liver disease. 3/AKI. 4Encephalopathy --presumably from alcohol withdrawal, hepatic encaphalopathy though no cirrhosis on CT at [facility] in 2019   3/22 - HEme conslt - cocern for myleoma v Waldenstrom ( .  His total protein is quite high.  His calcium is quite high.  His albumin is low.) Getting seruum viscolity due to Acue encephalpthy. Pan CT   - unremarlkable. ID thinks low odds  for infection. ID recommending MRI. Ronnie Dunn report Obutndatioin with restlessness - on prn ativan. Last drink > 2 weeks go . Wife describes 2022 admit for Low NA - and seziure like activity but no  EEG or neuro conslt in record.  Started Precedex    Interim History / Subjective:   3/23- Currently on 6 L nasal cannula and on supine.  - onoging for several days  RN reported that lab is saying, "His blood is too thick to obtain a serum protein, parathyroid, viscosity. " .    Kidney injury continues with BUN 60 and creatinine 2.4 mg percent slightly down.  Renal continuing fluids at D5 water  --Low-grade fever persist but white count normal. ->  Antibiotics all stopped yesterda by ID  -Precedex continues.  Also getting trazodone, nicotine patch, Ativan as needed, Haldol, lactulose => bit more responsive today [nurse.cerebral EEG without any seizures but showing encephalopathy.  MRI brain could not be done because patient desaturated while supine.  Ammonia 65.    Objective:  Blood pressure 125/83, pulse 86, temperature 99.9 F (37.7 C), temperature source Axillary, resp. rate (!) 22, height 6\' 2"  (1.88 m), weight 106.2 kg, SpO2 93%.        Intake/Output Summary (Last 24 hours) at 04/09/2023 1155 Last data filed at 04/09/2023 0600 Gross per 24 hour  Intake 3887.66 ml  Output 1175 ml  Net 2712.66 ml   Filed Weights   04/07/23 0500 04/08/23 0100 04/09/23 0406  Weight: 103.9 kg 106.1 kg 106.2 kg    Examination: General Appearance:  Looks criticall ill OBESE - + Head:  Normocephalic, without obvious abnormality, atraumatic Eyes:  PERRL - YES, conjunctiva/corneas - muddy     Ears:  Normal external ear canals, both ears Nose:  G tube - yes Throat:  ETT TUBE - no , OG tube - no Neck:  Supple,  No enlargement/tenderness/nodules Lungs: Clear to auscultation bilaterally, Heart:  S1 and S2 normal, no murmur, CVP - no.  Pressors - no Abdomen:  Soft, no masses, no organomegaly Genitalia / Rectal:  Not done Extremities:  Extremities- itnact Skin:  ntact in exposed areas . Sacral area - not examined Neurologic:  Sedation - precedex gtt, trazodone as needed, Ativan as needed, Haldol as  needed, getting scheduled lactulose-> RASS - -3 . Moves all 4s - yes. CAM-ICU - PISITIVE . Orientation - NOT oriatned      Labs/imaging personally reviewed:  CT head 3/19 > neg. Renal US 3/19 > neg. CT chest/abd/pelv 3/21 >  Cerebral EEG 04/08/2023: No seizures.  Assessment & Plan:  HX OF ETOH Acute metabolic encephalopathy - multifactorial 2/2 hyperammonemia, EtOH, presumed early developing sepsis of unclear etiology = . CT head neg, TSH, ABG normal. Folate elevated, B12 pending. -Stable EEG 04/08/2023: No seizures but shows encephalopathy.   3/23- restlessness +  obtundation continues. Unclear cause though Multiple Myeloma ias a DDx.  Better controlled with Precedex.  Unable to do MRI because of desaturations    PLAN   -  Regular EEG ordered for 04/10/2023 - MRI brain wo contrast (has low GFR) -> when possible and more stable  -Continue PRCEDEX -Serum viscosity needed: Unable to do because serum is viscous] - avoid cephalosporins - PRN ATIVAN  Concern for sepsis of unclear etiology - some suspicion for early aspiration PNA. Some congestion vs infiltrate on CXR, oral antibiotic stopped 04/08/2023 infectious diseases  04/09/2023: Off antibiotics.  ID consult does not think patient has infectious problem  Plan - Monitor  off antibiotics   Acute hypoxic respiratory failure - 2/2 above + baseline COPD (though not on O2), OSA. -Baseline 2 L nasal cannula at night Hx COPD, OSA, tobacco dependence.  3/22 and 3/23  - protecing airway but at risk for decompensation.  Needing 6 L oxygen off for several days..  Encephalopathy prevents BiPAP or CPAP  PLNA - Continue supplemental O2 as needed to maintain SpO2 > 92%. -RTriple therapy nebs in lieu of PTA Trelegy. -Smoking cessation counseling at discharge  AKI.  3/23 - steady, renal following. On d5W  PLAN Per renal   Hypercalcemia Hypokalemia Hyper PRoteineiam Hypoalbiuminema Hyperviscosity - lab unable to sample many test.  -Despite patient being on Xarelto  Concern for myleoma. Seen by Dr Abbie Sons 04/08/23  Plan  - await serium viscosity -unable to send - calcitonin per heme - 24h UPEPE/UIFFE.Light chainrs -in progress and pending -Get Doppler lower extremity 04/10/23 (continue Xarelto for A-fib]   Hx dCHF, A.fib on Xarelto, HTN, HLD.  -Echo 04/08/2023: Normal left ventricle ejection fraction but right ventricular dysfunction present and is new compared to July 2024. Currently on schedled lopressr and hydralazine.  Having bradycardia with Precedex.  PLAN - Hold diuresis for now, appears a bit dry on exam. - F/u on BNP. -Continue hydralazine scheduled -Stop pressure while on Precedex. - Continue Xarelto. -Do hydralazine as needed -Might need right heart catheterization   Best practice (evaluated daily):  Diet/type: NPO -RD consultation for tube feeds DVT prophylaxis: DOAC Xarelto Pressure ulcer(s): pressure ulcer assessment deferred  GI prophylaxis: Pepcid Lines: N/A Foley:  N/A Code Status:  full code Last date of multidisciplinary goals of care discussion: None yet.   322 - wife updated 04/09/2023: Wife updated at the bedside.    ATTESTATION & SIGNATURE   The patient Ronnie Dunn is critically ill with multiple organ systems failure and requires high complexity decision making for assessment and support, frequent evaluation and titration of therapies, application of advanced monitoring technologies and extensive interpretation of multiple databases and discussion with other appropriate health care personnel such as bedside nurses, social workers, case Production designer, theatre/television/film, consultants, respiratory therapists, nutritionists, secretaries etc.,  Critical care time includes but is not restricted to just documentation time. Documentation can happen in parallel or sequential to care time depending on case mix urgency and priorities for the shift. So, overall critical Care Time devoted to patient care services  described in this note is  30  Minutes.   This time reflects time of care of this signee Dr Kalman Shan which includ does not reflect procedure time, or teaching time or supervisory time of PA/NP/Med student/Med Resident etc but could involve care discussion time     Dr. Kalman Shan, M.D., [facility].C.P Pulmonary and Critical Care Medicine Staff Physician, [facility] Pager: [phone], If no answer or between  15:00h - 7:00h: call [phone]  04/09/2023 12:50 PM     LABS    PULMONARY Recent Labs  Lab 04/03/23 1713 04/06/23 1633 04/07/23 1026  PHART 7.55*  --  7.52*  PCO2ART 41  --  41  PO2ART 70*  --  101  HCO3 35.9* 32.8* 33.1*  O2SAT 95.1 91.9 98.5    CBC Recent Labs  Lab 04/05/23 0735 04/07/23 1251 04/08/23 0830  HGB 11.8* 13.1 12.2*  HCT 37.7* 43.4 39.2  WBC 7.9 9.8 8.9  PLT 346 363 289    COAGULATION Recent Labs  Lab 04/03/23 1118  INR 1.9*    CARDIAC  No results for input(s): "TROPONINI" in the last 168 hours. No results for input(s): "PROBNP" in the last 168 hours.   CHEMISTRY Recent Labs  Lab 04/04/23 0244 04/05/23 0735 04/06/23 0515 04/06/23 0948 04/07/23 0508 04/08/23 0311 04/08/23 0830 04/09/23 0555  NA 135 136 137 142 145  --  146* 146*  K 2.4* 2.8* 2.6* 2.8* 3.2*  --  2.7* 3.1*  CL 93* 96* 96* 100 107  --  108 111  CO2 30 28 29 25 23   --  26 25  GLUCOSE 96 108* 128* 120* 120*  --  123* 139*  BUN 21 30* 32* 33* 43*  --  58* 60*  CREATININE 1.81* 1.97* 1.99* 2.10* 2.37*  --  2.91* 2.40*  CALCIUM 11.2* 11.8* 11.8* 12.3* 12.2*  --  11.0* 9.7  MG 1.3* 1.9  --   --   --  1.8  --   --   PHOS  --  2.0*  --  3.9  --   --  2.5 3.1   Estimated Creatinine Clearance: 40.9 mL/min (A) (by C-G formula based on SCr of 2.4 mg/dL (H)).   LIVER Recent Labs  Lab 04/03/23 1118 04/06/23 0948 04/07/23 0508 04/08/23 0830 04/09/23 0555  AST 18 27 26 22   --   ALT 5 5 9 7   --   ALKPHOS  70 70 72 77  --   BILITOT 0.8 0.8 1.1 1.0  --   PROT 10.7* 11.1* 11.2* 10.8*  --   ALBUMIN 2.1* 2.0* 2.2* 1.8* 1.7*  INR 1.9*  --   --   --   --      INFECTIOUS Recent Labs  Lab 04/07/23 1315 04/07/23 1552 04/08/23 2359 04/09/23 0555  LATICACIDVEN 2.0* 2.4* 1.3 1.2  PROCALCITON 1.34  --   --   --      ENDOCRINE CBG (last 3)  Recent Labs    04/06/23 1514 04/07/23 1149 04/07/23 2054  GLUCAP 119* 118* 127*         IMAGING x48h  - image(s) personally visualized  -   highlighted in bold Rapid EEG Result Date: 04/09/2023 Charlsie Quest, MD     04/09/2023  8:41 AM Patient Name: Ronnie Dunn MRN: [id number] Epilepsy Attending: Charlsie Quest Referring Physician/Provider: Kalman Shan, MD Date: 04/08/2023 Duration: 2 Hours 48 mins Patient history: 63yo M with ams. EEG to evaluate for seizure Level of alertness: comatose/ lethargic AEDs during EEG study: None Technical aspects: This EEG was obtained using a 10 lead EEG system positioned circumferentially without any parasagittal coverage (rapid EEG). Computer selected EEG is reviewed as  well as background features and all clinically significant events. Description: EEG showed continuous generalized 3 to 6 Hz theta-delta slowing admixed with 12-14hz  beta activity. Hyperventilation and photic stimulation were not performed.   ABNORMALITY - Continuous slow, generalized IMPRESSION: This limited ceribell EEG is suggestive of moderate to severe diffuse encephalopathy. No seizures or epileptiform discharges were seen throughout the recording. Charlsie Quest   DG CHEST PORT 1 VIEW Result Date: 04/08/2023 CLINICAL DATA:  Follow-up pneumothorax EXAM: PORTABLE CHEST 1 VIEW COMPARISON:  04/07/2023 FINDINGS: Cardiac shadow is enlarged but stable. Feeding catheter is noted extending towards the stomach. The lungs are well aerated bilaterally. No focal pneumothorax or effusion is seen. Mild central vascular congestion is noted. No bony  abnormality is seen. IMPRESSION: Mild vascular congestion without significant edema. No evidence of pneumothorax. Electronically Signed  By: Alcide Clever M.D.   On: 04/08/2023 22:33   ECHOCARDIOGRAM COMPLETE Result Date: 04/08/2023    ECHOCARDIOGRAM REPORT   Patient Name:   Ronnie Dunn Date of Exam: 04/08/2023 Medical Rec #:  [id number]     Height:       74.0 in Accession #:    [id number]    Weight:       233.9 lb Date of Birth:  03/24/1960     BSA:          2.323 m Patient Age:    62 years      BP:           183/116 mmHg Patient Gender: M             HR:           100 bpm. Exam Location:  Inpatient Procedure: 2D Echo, Cardiac Doppler and Color Doppler (Both Spectral and Color            Flow Doppler were utilized during procedure). Indications:    Acute Respiratory Distress  History:        Patient has prior history of Echocardiogram examinations, most                 recent 07/22/2022. Risk Factors:Hypertension, Diabetes, Current                 Smoker and Sleep Apnea.  Sonographer:    Karma Ganja Referring Phys: 84 Ray Gervasi  Sonographer Comments: Technically difficult study due to poor echo windows. IMPRESSIONS  1. Left ventricular ejection fraction, by estimation, is 55 to 60%. The left ventricle has normal function. The left ventricle has no regional wall motion abnormalities. There is mild left ventricular hypertrophy. Left ventricular diastolic parameters are indeterminate.  2. Right ventricular systolic function is moderately reduced. The right ventricular size is mildly enlarged. Mildly increased right ventricular wall thickness. There is normal pulmonary artery systolic pressure. The estimated right ventricular systolic pressure is 33.2 mmHg.  3. Left atrial size was mildly dilated.  4. Right atrial size was severely dilated.  5. A small pericardial effusion is present.  6. The mitral valve is normal in structure. No evidence of mitral valve regurgitation. No evidence of mitral stenosis.  7.  The aortic valve was not well visualized. Aortic valve regurgitation is not visualized. No aortic stenosis is present.  8. The inferior vena cava is dilated in size with >50% respiratory variability, suggesting right atrial pressure of 8 mmHg. Comparison(s): A prior study was performed on 06/22/2021. RV is dilated and reduced function compared to prior study. FINDINGS  Left Ventricle: Left ventricular ejection fraction, by estimation, is 55 to 60%. The left ventricle has normal function. The left ventricle has no regional wall motion abnormalities. The left ventricular internal cavity size was normal in size. There is  mild left ventricular hypertrophy. Left ventricular diastolic parameters are indeterminate. Right Ventricle: The right ventricular size is mildly enlarged. Mildly increased right ventricular wall thickness. Right ventricular systolic function is moderately reduced. There is normal pulmonary artery systolic pressure. The tricuspid regurgitant velocity is 2.51 m/s, and with an assumed right atrial pressure of 8 mmHg, the estimated right ventricular systolic pressure is 33.2 mmHg. Left Atrium: Left atrial size was mildly dilated. Right Atrium: Right atrial size was severely dilated. Pericardium: A small pericardial effusion is present. Mitral Valve: The mitral valve is normal in structure. No evidence of mitral valve regurgitation. No evidence of mitral valve stenosis.  Tricuspid Valve: The tricuspid valve is normal in structure. Tricuspid valve regurgitation is mild . No evidence of tricuspid stenosis. Aortic Valve: The aortic valve was not well visualized. Aortic valve regurgitation is not visualized. No aortic stenosis is present. Aortic valve mean gradient measures 3.0 mmHg. Aortic valve peak gradient measures 5.4 mmHg. Aortic valve area, by VTI measures 3.07 cm. Pulmonic Valve: The pulmonic valve was normal in structure. Pulmonic valve regurgitation is not visualized. No evidence of pulmonic  stenosis. Aorta: The aortic root is normal in size and structure. Venous: The inferior vena cava is dilated in size with greater than 50% respiratory variability, suggesting right atrial pressure of 8 mmHg. IAS/Shunts: The interatrial septum was not well visualized.  LEFT VENTRICLE PLAX 2D LVIDd:         5.20 cm   Diastology LVIDs:         3.90 cm   LV e' medial:    8.45 cm/s LV PW:         1.20 cm   LV E/e' medial:  10.5 LV IVS:        1.20 cm   LV e' lateral:   6.82 cm/s LVOT diam:     2.20 cm   LV E/e' lateral: 13.0 LV SV:         51 LV SV Index:   22 LVOT Area:     3.80 cm  RIGHT VENTRICLE            IVC RV Basal diam:  4.50 cm    IVC diam: 2.40 cm RV S prime:     9.28 cm/s TAPSE (M-mode): 0.8 cm LEFT ATRIUM              Index        RIGHT ATRIUM           Index LA diam:        4.70 cm  2.02 cm/m   RA Area:     33.90 cm LA Vol (A2C):   111.0 ml 47.78 ml/m  RA Volume:   139.00 ml 59.83 ml/m LA Vol (A4C):   59.2 ml  25.48 ml/m LA Biplane Vol: 87.3 ml  37.58 ml/m  AORTIC VALVE AV Area (Vmax):    2.93 cm AV Area (Vmean):   2.71 cm AV Area (VTI):     3.07 cm AV Vmax:           116.33 cm/s AV Vmean:          77.367 cm/s AV VTI:            0.166 m AV Peak Grad:      5.4 mmHg AV Mean Grad:      3.0 mmHg LVOT Vmax:         89.62 cm/s LVOT Vmean:        55.080 cm/s LVOT VTI:          0.134 m LVOT/AV VTI ratio: 0.81  AORTA Ao Root diam: 3.90 cm MITRAL VALVE               TRICUSPID VALVE MV Area (PHT): 3.78 cm    TR Peak grad:   25.2 mmHg MV Decel Time: 201 msec    TR Vmax:        251.00 cm/s MV E velocity: 88.43 cm/s                            SHUNTS  Systemic VTI:  0.13 m                            Systemic Diam: 2.20 cm Sunit Tolia Electronically signed by Tessa Lerner Signature Date/Time: 04/08/2023/2:44:11 PM    Final    CT CHEST ABDOMEN PELVIS WO CONTRAST Result Date: 04/07/2023 CLINICAL DATA:  Sepsis EXAM: CT CHEST, ABDOMEN AND PELVIS WITHOUT CONTRAST TECHNIQUE: Multidetector CT  imaging of the chest, abdomen and pelvis was performed following the standard protocol without IV contrast. RADIATION DOSE REDUCTION: This exam was performed according to the departmental dose-optimization program which includes automated exposure control, adjustment of the mA and/or kV according to patient size and/or use of iterative reconstruction technique. COMPARISON:  None Available. FINDINGS: CT CHEST FINDINGS Cardiovascular: Aortic atherosclerosis. Cardiomegaly. Three-vessel coronary artery calcifications. Small pericardial effusion. Mediastinum/Nodes: No enlarged mediastinal, hilar, or axillary lymph nodes. Thyroid gland, trachea, and esophagus demonstrate no significant findings. Lungs/Pleura: Mild centrilobular and paraseptal emphysema. Small bilateral pleural effusions Musculoskeletal: No chest wall abnormality. No acute osseous findings. CT ABDOMEN PELVIS FINDINGS Hepatobiliary: No solid liver abnormality is seen. No gallstones, gallbladder wall thickening, or biliary dilatation. Pancreas: Unremarkable. No pancreatic ductal dilatation or surrounding inflammatory changes. Spleen: Normal in size without significant abnormality. Adrenals/Urinary Tract: Adrenal glands are unremarkable. Kidneys are normal, without renal calculi, solid lesion, or hydronephrosis. Bladder is decompressed by Foley catheter. Stomach/Bowel: Stomach is within normal limits. Esophagogastric tube with tip in the proximal duodenal. Appendix appears normal. No evidence of bowel wall thickening, distention, or inflammatory changes. Vascular/Lymphatic: Aortic atherosclerosis. Infrarenal abdominal aortic aneurysm measuring up to 4.2 x 3.5 cm in caliber (series 2, image 69). No enlarged abdominal or pelvic lymph nodes. Reproductive: No mass or other abnormality. Other: Small fat containing inguinal hernias.  No ascites. Musculoskeletal: No acute osseous findings. IMPRESSION: 1.  Small bilateral pleural effusions. 2.  Small pericardial  effusion. 3. Infrarenal abdominal aortic aneurysm measuring up to 4.2 x 3.5 cm in caliber. Recommend follow-up CT or MR as appropriate in 12 months and referral to or continued care with vascular specialist. (Ref.: J Vasc Surg. 2018; 67:2-77 and J Am Coll Radiol 2013;10(10):789-794.) 4.  Coronary artery disease. Aortic Atherosclerosis (ICD10-I70.0) and Emphysema (ICD10-J43.9). Electronically Signed   By: Jearld Lesch M.D.   On: 04/07/2023 17:50   DG Abd Portable 1V Result Date: 04/07/2023 CLINICAL DATA:  NG tube placement EXAM: PORTABLE ABDOMEN - 1 VIEW COMPARISON:  None Available. FINDINGS: Limited x-ray for tube placement has feeding tube with tip extending to the right mid abdomen, possibly distal stomach or proximal duodenum. There are air-filled loops of distended bowel throughout the mid abdomen including some mildly dilated loops. Please correlate with history. Enlarged heart. IMPRESSION: Limited x-ray for tube placement has feeding tube extending to the right side of the abdomen, possibly distal stomach or proximal duodenum Electronically Signed   By: Karen Kays M.D.   On: 04/07/2023 13:00

## 2023-04-09 NOTE — Progress Notes (Signed)
 Nutrition Brief Note   RD received a consult to initiate enteral nutrition. Please see full RD note from 04/07/23. Cortrak tube placed 3/21, per x-ray tip distal stomach or proximal duodenum. Labs and medications reviewed. Sent secure chat to RN.   Tube feeds via Cortrak: - Osmolite 1.5 at 60 ml/h (1440 ml per day), starting at 19mL/hr and advancing by 10mL Q8H  - ProSource TF20 60 ml BID Provides 2320 kcal, 130 gm protein, 1097 ml free water daily  Monitor magnesium, potassium, and phosphorus BID for at least 3 days, MD to replete as needed, as pt may be at risk for refeeding syndrome.   RD team will continue to follow.   Kirby Crigler RD, LDN Clinical Dietitian

## 2023-04-09 NOTE — Progress Notes (Signed)
 [facility] Progress Note  Subjective:  Seen in room Pt not responding very well Does not look uncomfortable  Vitals:   04/09/23 0701 04/09/23 0956 04/09/23 0958 04/09/23 1031  BP: (!) 147/79 125/83 125/83   Pulse: 81  86   Resp: (!) 22     Temp:      TempSrc:      SpO2: 92%   93%  Weight:      Height:        Exam: Gen stuporous, disheveled, no distress,  Regan O2 HFNC 6 lpm  No jvd  Chest clear bilat to bases RRR no MRG Abd soft obese ntnd no mass or ascites +bs Ext no LE or UE edema, poor skin turgor Neuro as above    Renal-related home meds: Lasix 40-120 mg daily as needed Lisinopril 5 mg daily Others: Pulmicort, Xarelto, Farxiga, colchicine, allopurinol, PRNs   Date                             Creat               eGFR (ml/min) 2008                            1.04- 1.97 2022                            0.69- 0.98 May- aug 20230.77- 1.26 Mar 2022                     1.27 June 2022                   1.36 04/03/23                        2.00                  3/18                             1.81 3/19                             1.97 3/20                             1.99 3/21                             2.37 04/08/23                        2.91                    Renal ultrasound-11/14 cm kidneys without hydronephrosis Urinalysis 3/19: mod Hgb, prot 100, 0-5 WBCs, 11-20 RBC, rare bacteria Echo 3/22: LVEF 60-65%     Assessment/ Plan: AKI - b/l creat 1.1- 1.3 from mid 2024. Pt was diuresed w/ IV lasix 40-80mg  for about 3-4 days, net neg 2 L overall. BP's were high then normal, today there are low BP's prob due to sedation. Pt was hypercalcemic on presentation as well. UA unremarkable, renal US no obstruction. Suspect initial AKI due to  hypercalcemia and/or underlying myeloma, 2nd rise may be from diuresis. Pt still looks dry. Creat down at 2.4 w/ gentle IVF"s, will cont at 65/hr. F/u labs in am.  Hypernatremia - sodium stable at 146 today, will ^ D5W at 85  cc/hr (in addition to LR as above, needs both). F/u Na in am.  Hypercalcemia - getting calcitonin, and sp Zometa, per oncology. 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 Ca down at 9.6 COPD HTN - will lower metoprolol and hydralazine by 50% today Atrial fib - on xarelto Etoh use disorder - in withdrawal per primary notes           Vinson Moselle MD  [facility] 04/09/2023, 11:18 AM  Recent Labs  Lab 04/07/23 1251 04/08/23 0830 04/09/23 0555  HGB 13.1 12.2*  --   ALBUMIN  --  1.8* 1.7*  CALCIUM  --  11.0* 9.7  PHOS  --  2.5 3.1  CREATININE  --  2.91* 2.40*  K  --  2.7* 3.1*   No results for input(s): "IRON", "TIBC", "FERRITIN" in the last 168 hours. Inpatient medications:  arformoterol  15 mcg Nebulization BID   budesonide (PULMICORT) nebulizer solution  0.5 mg Nebulization BID   Chlorhexidine Gluconate Cloth  6 each Topical Daily   famotidine  40 mg Per Tube Daily   folic acid  1 mg Per Tube Daily   hydrALAZINE  50 mg Per Tube TID   lactulose  30 g Per Tube BID   metoprolol tartrate  50 mg Per Tube BID   multivitamin  15 mL Per Tube Daily   nicotine  21 mg Transdermal Daily   pantoprazole (PROTONIX) IV  40 mg Intravenous Q24H   revefenacin  175 mcg Nebulization Daily   rivaroxaban  15 mg Per Tube Q supper   thiamine  100 mg Oral Daily   Or   thiamine  100 mg Intravenous Daily    dexmedetomidine (PRECEDEX) IV infusion 0.6 mcg/kg/hr (04/09/23 0853)   dextrose 65 mL/hr at 04/09/23 0834   acetaminophen (TYLENOL) oral liquid 160 mg/5 mL, acetaminophen **OR** acetaminophen, albuterol, haloperidol lactate, hydrALAZINE, LORazepam, metoprolol tartrate, ondansetron **OR** ondansetron (ZOFRAN) IV, mouth rinse, sodium chloride, traZODone

## 2023-04-10 ENCOUNTER — Inpatient Hospital Stay (HOSPITAL_COMMUNITY)

## 2023-04-10 DIAGNOSIS — E876 Hypokalemia: Secondary | ICD-10-CM

## 2023-04-10 DIAGNOSIS — J9601 Acute respiratory failure with hypoxia: Secondary | ICD-10-CM | POA: Diagnosis not present

## 2023-04-10 DIAGNOSIS — R609 Edema, unspecified: Secondary | ICD-10-CM | POA: Diagnosis not present

## 2023-04-10 DIAGNOSIS — N179 Acute kidney failure, unspecified: Secondary | ICD-10-CM | POA: Diagnosis not present

## 2023-04-10 DIAGNOSIS — G9341 Metabolic encephalopathy: Secondary | ICD-10-CM | POA: Diagnosis not present

## 2023-04-10 LAB — RENAL FUNCTION PANEL
Albumin: 1.6 g/dL — ABNORMAL LOW (ref 3.5–5.0)
Anion gap: 7 (ref 5–15)
BUN: 51 mg/dL — ABNORMAL HIGH (ref 8–23)
CO2: 24 mmol/L (ref 22–32)
Calcium: 9.2 mg/dL (ref 8.9–10.3)
Chloride: 114 mmol/L — ABNORMAL HIGH (ref 98–111)
Creatinine, Ser: 1.96 mg/dL — ABNORMAL HIGH (ref 0.61–1.24)
GFR, Estimated: 38 mL/min — ABNORMAL LOW (ref >60)
Glucose, Bld: 196 mg/dL — ABNORMAL HIGH (ref 70–99)
Phosphorus: 2 mg/dL — ABNORMAL LOW (ref 2.5–4.6)
Potassium: 3.1 mmol/L — ABNORMAL LOW (ref 3.5–5.1)
Sodium: 145 mmol/L (ref 135–145)

## 2023-04-10 LAB — LIPID PANEL
Cholesterol: 76 mg/dL (ref 0–200)
HDL: 15 mg/dL — ABNORMAL LOW (ref >40)
LDL Cholesterol: 32 mg/dL (ref 0–99)
Total CHOL/HDL Ratio: 5.1 ratio
Triglycerides: 145 mg/dL (ref <150)
VLDL: 29 mg/dL (ref 0–40)

## 2023-04-10 LAB — IGG, IGA, IGM

## 2023-04-10 LAB — GLUCOSE, CAPILLARY
Glucose-Capillary: 159 mg/dL — ABNORMAL HIGH (ref 70–99)
Glucose-Capillary: 159 mg/dL — ABNORMAL HIGH (ref 70–99)
Glucose-Capillary: 162 mg/dL — ABNORMAL HIGH (ref 70–99)
Glucose-Capillary: 172 mg/dL — ABNORMAL HIGH (ref 70–99)
Glucose-Capillary: 175 mg/dL — ABNORMAL HIGH (ref 70–99)
Glucose-Capillary: 179 mg/dL — ABNORMAL HIGH (ref 70–99)
Glucose-Capillary: 190 mg/dL — ABNORMAL HIGH (ref 70–99)

## 2023-04-10 LAB — PHOSPHORUS: Phosphorus: 1.9 mg/dL — ABNORMAL LOW (ref 2.5–4.6)

## 2023-04-10 LAB — PROTEIN ELECTROPHORESIS, SERUM

## 2023-04-10 LAB — MRSA NEXT GEN BY PCR, NASAL: MRSA by PCR Next Gen: NOT DETECTED

## 2023-04-10 LAB — KAPPA/LAMBDA LIGHT CHAINS

## 2023-04-10 LAB — PTH, INTACT AND CALCIUM

## 2023-04-10 LAB — BRAIN NATRIURETIC PEPTIDE: B Natriuretic Peptide: 1033.8 pg/mL — ABNORMAL HIGH (ref 0.0–100.0)

## 2023-04-10 LAB — MAGNESIUM: Magnesium: 2.1 mg/dL (ref 1.7–2.4)

## 2023-04-10 LAB — VISCOSITY, SERUM

## 2023-04-10 LAB — AMMONIA: Ammonia: 48 umol/L — ABNORMAL HIGH (ref 9–35)

## 2023-04-10 MED ORDER — FUROSEMIDE 10 MG/ML IJ SOLN
40.0000 mg | Freq: Once | INTRAMUSCULAR | Status: AC
  Administered 2023-04-10: 40 mg via INTRAVENOUS
  Filled 2023-04-10: qty 4

## 2023-04-10 MED ORDER — POTASSIUM CHLORIDE 10 MEQ/100ML IV SOLN
10.0000 meq | INTRAVENOUS | Status: AC
  Administered 2023-04-10 (×4): 10 meq via INTRAVENOUS
  Filled 2023-04-10 (×4): qty 100

## 2023-04-10 MED ORDER — ORAL CARE MOUTH RINSE: 15.0000 mL | OROMUCOSAL | Status: DC | PRN

## 2023-04-10 MED ORDER — STERILE WATER FOR INJECTION IJ SOLN
INTRAMUSCULAR | Status: AC
  Administered 2023-04-10: 10 mL
  Filled 2023-04-10: qty 10

## 2023-04-10 MED ORDER — ORAL CARE MOUTH RINSE
15.0000 mL | OROMUCOSAL | Status: DC
  Administered 2023-04-10 – 2023-04-11 (×8): 15 mL via OROMUCOSAL

## 2023-04-10 MED ORDER — LORAZEPAM 2 MG/ML IJ SOLN
INTRAMUSCULAR | Status: AC
  Filled 2023-04-10: qty 1

## 2023-04-10 MED ORDER — POTASSIUM PHOSPHATES 15 MMOLE/5ML IV SOLN
30.0000 mmol | Freq: Once | INTRAVENOUS | Status: AC
  Administered 2023-04-10: 30 mmol via INTRAVENOUS
  Filled 2023-04-10: qty 10

## 2023-04-10 MED ORDER — LORAZEPAM 2 MG/ML IJ SOLN
1.0000 mg | Freq: Four times a day (QID) | INTRAMUSCULAR | Status: DC | PRN
  Administered 2023-04-10 – 2023-04-13 (×6): 2 mg via INTRAVENOUS
  Filled 2023-04-10 (×6): qty 1

## 2023-04-10 MED ORDER — RIVAROXABAN 20 MG PO TABS
20.0000 mg | ORAL_TABLET | Freq: Every day | ORAL | Status: DC
  Administered 2023-04-10 – 2023-04-14 (×5): 20 mg
  Filled 2023-04-10 (×5): qty 1

## 2023-04-10 MED ORDER — DEXMEDETOMIDINE HCL IN NACL 400 MCG/100ML IV SOLN
0.0000 ug/kg/h | INTRAVENOUS | Status: DC
  Administered 2023-04-10: 1 ug/kg/h via INTRAVENOUS
  Administered 2023-04-10 (×2): 1.2 ug/kg/h via INTRAVENOUS
  Administered 2023-04-11: 1.1 ug/kg/h via INTRAVENOUS
  Administered 2023-04-11: 1 ug/kg/h via INTRAVENOUS
  Administered 2023-04-11 (×2): 1.2 ug/kg/h via INTRAVENOUS
  Administered 2023-04-11 (×3): 1.1 ug/kg/h via INTRAVENOUS
  Filled 2023-04-10 (×9): qty 100
  Filled 2023-04-10: qty 200

## 2023-04-10 NOTE — Progress Notes (Signed)
 PROGRESS NOTE    Ronnie Dunn  QIO:[id number] DOB: [birth year] DOA: 04/03/2023 PCP: [name], MD   Brief Narrative: 63 yo male  with past medical history significant for COPD, chronic diastolic congestive heart failure, HTN, HLD, paroxysmal atrial fibrillation on anticoagulation with Xarelto, GERD, OSA, tobacco use disorder, alcohol use disorder, obesity who presented to [facility] from home via EMS on 04/03/2022 with complaints of productive cough, fever/chills, nausea, progressive weakness/fatigue and shortness of breath over the last 2 weeks.  Spouse also noted that he was confused, not wanting to eat or drink anything.  Also reported to be hypothermic with temperature 94.0 F.  Also has had complaints of low back pain for past two weeks which is new,no falls reported    Sputum thick, green in appearance.  Recently completed a course of azithromycin prescribed by his pulmonologist, [name] which did not improve his symptoms.  Given progression, confusion spouse called EMS and patient was brought to the ED for further evaluation management.   In the ED, temperature 99.4 F, HR 65, RR 23, BP 145/90, SpO2 88% on room air.  WBC 8.1, hemoglobin 11.8, platelet count 337.  Sodium 134, potassium 2.6, chloride 89, CO2 33, glucose 113, BUN 20, creatinine 2.00.  Lipase 27, AST 18, ALT less than 5, total bilirubin 0.8.  Ammonia level 24.  BNP 706.8.  Lactic acid 1.7.  INR 1.9.  COVID/influenza/RSV PCR negative.  TSH 3.591.  Urinalysis unrevealing.  Chest x-ray with increased bilateral interstitial and diffuse patchy opacities likely pulmonary edema, blunting of the costophrenic angles which may represent small pleural effusions, noted cardiomegaly.  Patient was given 2 L LR bolus, started on vancomycin, cefepime and metronidazole by EDP.  TRH consulted for admission for further evaluation management of acute hypoxic respiratory failure likely secondary to community-acquired pneumonia. CT  chest abdomen and pelvis shows small bilateral pleural effusion, small pericardial effusion, infrarenal abdominal aortic aneurysm 4.2 x 3.5 cm recommending follow-up CT or MRI in 12 months, CAD.  Assessment & Plan:   Principal Problem:   Encephalopathy Active Problems:   Sepsis due to pneumonia (HCC)   AKI (acute kidney injury) (HCC)   Acute metabolic encephalopathy  FUO -patient was treated with vancomycin cefepime and Rocephin etc. initially.  ID was consulted.  COVID RSV and influenza were negative  He has not had any fever spikes in the last 48 hours. White count has normalized lactic acidosis has resolved.  Patient was transferred to stepdown on 3/21 due to respiratory distress  PCCM started zosyn and ID was consulted CT chest abdomen and pelvis did not reveal any evidence of acute infection. Antibiotics were stopped 3/22  Rule out multiple myeloma/Waldenstrm's macroglobulinemia -patient presented with hypercalcemia, renal insufficiency, anemia and back pain.  Appreciate oncology input.  SPEP UPEP pending  Kappa lambda chains ordered Apparently lab cannot process some of these labs  ?  Due to high viscosity His CT of the chest from 04/09/2023 shows third lytic lesions worrisome for multiple myeloma Dr. Myna Hidalgo following.  Paroxysmal A-fib/A-fib RVR has history of atrial fibrillation on Xarelto, beta-blockers were stopped as patient became bradycardic after starting Precedex.    Hypokalemia -3.1 continue to replete  Hyponatremia fluids changed to hypotonic fluids continue slow rate stable.  COPD Not oxygen dependent at baseline.  Follows with pulmonology outpatient [name]. On Trelegy Ellipta at baseline   Low back pain for the last 2 weeks per wife MRI lumbar spine -lumbar spondylosis, moderate bilateral  neuroforaminal stenosis at L5-S1, mild spinal canal stenosis, moderate right lateral recess stenosis at L4-L5  Metabolic encephalopathy likely multifactorial -hypoxic  respiratory failure/hyperammonia/EtOH /hypercalcemia/?  Seizures- wife reports he is more confused which is not his baseline he is hallucinating UA on admission was negative for UTI CT head remote left cerebellar infarct TSH normal  Ammonia 63 from 67 up from 55 core track placed in stepdown for lactulose Continue lactulose Cerebel eeg moderate encephalopathy no seizure activity is noted  For EEG tomorrow  MRI brain could not be done as patient became hypoxic with spine  Chronic diastolic congestive heart failure, decompensated Essential hypertension Home regimen includes metoprolol succinate 50 mg p.o. daily, hydralazine 50 mg p.o. 3 times daily, spironolactone 25 mg p.o. daily, lisinopril 5 mg p.o. daily, furosemide 120 mg p.o. daily.   All this meds have been on hold.    GERD -- Protonix 40 mg p.o. daily   HLD -- Atorvastatin 10 mg p.o. daily   Tobacco use disorder EtOH use disorder Counsel need for complete cessation/abstinence. --Nicotine patch   Obesity, class I Body mass index is 31.53 kg/m.     Moderate hypercalcemia -resolved.  Status post Zometa  Calcitonin unclear etiology ?malignancy?myeloma-phosphorus 2.8, vitamin D 73.8 Await PTH SPEP UPEP CT chest suspicious for multiple myeloma with scattered lytic lesions small pericardial effusion similar left lobe collapse consolidation, pulmonary artery hypertension.  AKI creatinine improvement with slow IV fluids.  ultrasound of the kidneys showed no hydronephrosis.  Appreciate nephrology, gentle IV fluids.  DVT prophylaxis:  rivaroxaban (XARELTO) tablet 20 mg    Code Status: Full Code Family Communication: dw wife    Estimated body mass index is 30.63 kg/m as calculated from the following:   Height as of this encounter: 6\' 2"  (1.88 m).   Weight as of this encounter: 108.2 kg.  DVT prophylaxis:xarelto Code Status:full Family Communication:dw wife and daughter on the phone Disposition Plan:  Status is:  Inpatient Remains inpatient appropriate because: acute illness   Consultants: none  Procedures:none  Antimicrobials:none Subjective:  Still on 6 L  objective: Vitals:   04/10/23 1000 04/10/23 1100 04/10/23 1133 04/10/23 1200  BP: (!) 170/84 (!) 140/63  (!) 162/92  Pulse: 84 (!) 101 71 68  Resp: 16 20 14 17   Temp:    98.3 F (36.8 C)  TempSrc:    Axillary  SpO2: 95% 95% 94% 93%  Weight:      Height:        Intake/Output Summary (Last 24 hours) at 04/10/2023 1232 Last data filed at 04/10/2023 1136 Gross per 24 hour  Intake 4680.26 ml  Output 1975 ml  Net 2705.26 ml   Filed Weights   04/08/23 0100 04/09/23 0406 04/10/23 0600  Weight: 106.1 kg 106.2 kg 108.2 kg    Examination:  General exam: Seen in mittens Respiratory system: Rhonchi to auscultation.  Tachypneic cardiovascular system: Irregular tachycardic gastrointestinal system: Abdomen is nondistended, soft and nontender. No organomegaly or masses felt. Normal bowel sounds heard. Central nervous system: mittens in place Extremities: Edema  Data Reviewed: I have personally reviewed following labs and imaging studies  CBC: Recent Labs  Lab 04/04/23 0244 04/05/23 0735 04/07/23 1251 04/08/23 0830  WBC 7.1 7.9 9.8 8.9  HGB 11.2* 11.8* 13.1 12.2*  HCT 36.5* 37.7* 43.4 39.2  MCV 100.0 98.4 100.7* 99.2  PLT 318 346 363 289   Basic Metabolic Panel: Recent Labs  Lab 04/05/23 0735 04/06/23 0515 04/06/23 0948 04/07/23 0508 04/08/23 0311 04/08/23 0830 04/09/23  4098 04/09/23 1332 04/09/23 1619 04/10/23 0536 04/10/23 0537  NA 136   < > 142 145  --  146* 146*  --   --  145  --   K 2.8*   < > 2.8* 3.2*  --  2.7* 3.1*  --   --  3.1*  --   CL 96*   < > 100 107  --  108 111  --   --  114*  --   CO2 28   < > 25 23  --  26 25  --   --  24  --   GLUCOSE 108*   < > 120* 120*  --  123* 139*  --   --  196*  --   BUN 30*   < > 33* 43*  --  58* 60*  --   --  51*  --   CREATININE 1.97*   < > 2.10* 2.37*  --  2.91*  2.40*  --   --  1.96*  --   CALCIUM 11.8*   < > 12.3* 12.2*  --  11.0* 9.7  --   --  9.2  --   MG 1.9  --   --   --  1.8  --   --  1.8 1.6*  --  2.1  PHOS 2.0*  --  3.9  --   --  2.5 3.1 2.8 2.5 2.0* 1.9*   < > = values in this interval not displayed.   GFR: Estimated Creatinine Clearance: 50.5 mL/min (A) (by C-G formula based on SCr of 1.96 mg/dL (H)). Liver Function Tests: Recent Labs  Lab 04/06/23 0948 04/07/23 0508 04/08/23 0830 04/09/23 0555 04/10/23 0536  AST 27 26 22   --   --   ALT 5 9 7   --   --   ALKPHOS 70 72 77  --   --   BILITOT 0.8 1.1 1.0  --   --   PROT 11.1* 11.2* 10.8*  --   --   ALBUMIN 2.0* 2.2* 1.8* 1.7* 1.6*   No results for input(s): "LIPASE", "AMYLASE" in the last 168 hours.  Recent Labs  Lab 04/06/23 0515 04/07/23 0601 04/08/23 1852 04/09/23 0555  AMMONIA 55* 67* 61* 63*   Coagulation Profile: No results for input(s): "INR", "PROTIME" in the last 168 hours.  Cardiac Enzymes: No results for input(s): "CKTOTAL", "CKMB", "CKMBINDEX", "TROPONINI" in the last 168 hours. BNP (last 3 results) No results for input(s): "PROBNP" in the last 8760 hours. HbA1C: No results for input(s): "HGBA1C" in the last 72 hours. CBG: Recent Labs  Lab 04/09/23 1650 04/10/23 0008 04/10/23 0403 04/10/23 0816 04/10/23 1134  GLUCAP 144* 175* 172* 179* 162*   Lipid Profile: No results for input(s): "CHOL", "HDL", "LDLCALC", "TRIG", "CHOLHDL", "LDLDIRECT" in the last 72 hours. Thyroid Function Tests: No results for input(s): "TSH", "T4TOTAL", "FREET4", "T3FREE", "THYROIDAB" in the last 72 hours.  Anemia Panel: No results for input(s): "VITAMINB12", "FOLATE", "FERRITIN", "TIBC", "IRON", "RETICCTPCT" in the last 72 hours.  Sepsis Labs: Recent Labs  Lab 04/07/23 1315 04/07/23 1552 04/08/23 2359 04/09/23 0555  PROCALCITON 1.34  --   --   --   LATICACIDVEN 2.0* 2.4* 1.3 1.2    Recent Results (from the past 240 hours)  Resp panel by RT-PCR (RSV, Flu A&B,  Covid) Anterior Nasal Swab     Status: None   Collection Time: 04/03/23 11:18 AM   Specimen: Anterior Nasal Swab  Result Value Ref Range Status  SARS Coronavirus 2 by RT PCR NEGATIVE NEGATIVE Final    Comment: (NOTE) SARS-CoV-2 target nucleic acids are NOT DETECTED.  The SARS-CoV-2 RNA is generally detectable in upper respiratory specimens during the acute phase of infection. The lowest concentration of SARS-CoV-2 viral copies this assay can detect is 138 copies/mL. A negative result does not preclude SARS-Cov-2 infection and should not be used as the sole basis for treatment or other patient management decisions. A negative result may occur with  improper specimen collection/handling, submission of specimen other than nasopharyngeal swab, presence of viral mutation(s) within the areas targeted by this assay, and inadequate number of viral copies(<138 copies/mL). A negative result must be combined with clinical observations, patient history, and epidemiological information. The expected result is Negative.  Fact Sheet for Patients:  BloggerCourse.com  Fact Sheet for Healthcare Providers:  SeriousBroker.it  This test is no t yet approved or cleared by the [place] FDA and  has been authorized for detection and/or diagnosis of SARS-CoV-2 by FDA under an Emergency Use Authorization (EUA). This EUA will remain  in effect (meaning this test can be used) for the duration of the COVID-19 declaration under Section 564(b)(1) of the Act, 21 U.S.C.section 360bbb-3(b)(1), unless the authorization is terminated  or revoked sooner.       Influenza A by PCR NEGATIVE NEGATIVE Final   Influenza B by PCR NEGATIVE NEGATIVE Final    Comment: (NOTE) The Xpert Xpress SARS-CoV-2/FLU/RSV plus assay is intended as an aid in the diagnosis of influenza from Nasopharyngeal swab specimens and should not be used as a sole basis for treatment. Nasal  washings and aspirates are unacceptable for Xpert Xpress SARS-CoV-2/FLU/RSV testing.  Fact Sheet for Patients: BloggerCourse.com  Fact Sheet for Healthcare Providers: SeriousBroker.it  This test is not yet approved or cleared by the [place] FDA and has been authorized for detection and/or diagnosis of SARS-CoV-2 by FDA under an Emergency Use Authorization (EUA). This EUA will remain in effect (meaning this test can be used) for the duration of the COVID-19 declaration under Section 564(b)(1) of the Act, 21 U.S.C. section 360bbb-3(b)(1), unless the authorization is terminated or revoked.     Resp Syncytial Virus by PCR NEGATIVE NEGATIVE Final    Comment: (NOTE) Fact Sheet for Patients: BloggerCourse.com  Fact Sheet for Healthcare Providers: SeriousBroker.it  This test is not yet approved or cleared by the [place] FDA and has been authorized for detection and/or diagnosis of SARS-CoV-2 by FDA under an Emergency Use Authorization (EUA). This EUA will remain in effect (meaning this test can be used) for the duration of the COVID-19 declaration under Section 564(b)(1) of the Act, 21 U.S.C. section 360bbb-3(b)(1), unless the authorization is terminated or revoked.  Performed at [facility], [address], [place], [place] [address]   Blood Culture (routine x 2)     Status: None   Collection Time: 04/03/23 11:18 AM   Specimen: BLOOD  Result Value Ref Range Status   Specimen Description   Final    BLOOD LEFT ANTECUBITAL Performed at [facility], [address], [place], [place] [address]    Special Requests   Final    BOTTLES DRAWN AEROBIC AND ANAEROBIC Blood Culture adequate volume Performed at [facility], [address] [place], [place] [address]    Culture   Final    NO GROWTH 5 DAYS Performed at  [facility], [address], [place], [place] [address]    Report  Status 04/08/2023 FINAL  Final  Blood Culture (routine x 2)     Status: None   Collection Time: 04/03/23 11:18 AM   Specimen: BLOOD RIGHT WRIST  Result Value Ref Range Status   Specimen Description   Final    BLOOD RIGHT WRIST Performed at [facility], [address], [place], [place] [address]    Special Requests   Final    BOTTLES DRAWN AEROBIC AND ANAEROBIC Blood Culture adequate volume Performed at [facility], [address] [place], [place] [address]    Culture   Final    NO GROWTH 5 DAYS Performed at [facility], [address] [place], [place] [address]    Report Status 04/08/2023 FINAL  Final  MRSA Next Gen by PCR, Nasal     Status: None   Collection Time: 04/03/23  5:17 PM   Specimen: Nasal Mucosa; Nasal Swab  Result Value Ref Range Status   MRSA by PCR Next Gen NOT DETECTED NOT DETECTED Final    Comment: (NOTE) The GeneXpert MRSA Assay (FDA approved for NASAL specimens only), is one component of a comprehensive MRSA colonization surveillance program. It is not intended to diagnose MRSA infection nor to guide or monitor treatment for MRSA infections. Test performance is not FDA approved in patients less than 10 years old. Performed at [facility], [address], [place], [place] [address]   Culture, blood (Routine X 2) w Reflex to ID Panel     Status: None (Preliminary result)   Collection Time: 04/07/23  1:15 PM   Specimen: BLOOD LEFT HAND  Result Value Ref Range Status   Specimen Description   Final    BLOOD LEFT HAND Performed at [facility], [address] [place], [place] [address]    Special Requests   Final    BOTTLES DRAWN AEROBIC ONLY Blood Culture adequate volume Performed at [facility], [address], [place], [place] [address]    Culture   Final    NO GROWTH 3 DAYS Performed at [facility], [address], [place], [place] [address]    Report Status PENDING  Incomplete  Culture, blood (Routine X 2) w Reflex to ID Panel     Status: None (Preliminary result)   Collection Time: 04/07/23  2:08 PM   Specimen: BLOOD RIGHT ARM  Result Value Ref Range Status   Specimen Description   Final    BLOOD RIGHT ARM Performed at [facility], [address], [place], [place] [address]    Special Requests   Final    BOTTLES DRAWN AEROBIC AND ANAEROBIC Blood Culture results may not be optimal due to an inadequate volume of blood received in culture bottles Performed at [facility], [address] [place], [place] [address]    Culture   Final    NO GROWTH 3 DAYS Performed at [facility], [address], [place], [place] [address]    Report Status PENDING  Incomplete  MRSA Next Gen by PCR, Nasal     Status: None   Collection Time: 04/10/23  8:22 AM   Specimen: Nasal Mucosa; Nasal Swab  Result Value Ref Range Status   MRSA by PCR Next Gen NOT DETECTED NOT DETECTED Final    Comment: (NOTE) The GeneXpert MRSA Assay (FDA approved for NASAL specimens only), is one component of a comprehensive MRSA colonization surveillance program. It is not  intended to diagnose MRSA infection nor to guide or monitor treatment for MRSA infections. Test performance is not FDA approved in patients less than 68 years old. Performed at [facility], [address], [place], [place] [address]          Radiology Studies: VAS [place] LOWER EXTREMITY VENOUS (DVT) Result Date: 04/10/2023  Lower Venous DVT Study Patient Name:  Ronnie Dunn  Date of Exam:   04/10/2023 Medical Rec #: [id number]      Accession #:    [id number] Date of Birth: 07-27-1960      Patient Gender: M Patient Age:   57 years Exam Location:  [facility] Procedure:      VAS [place] LOWER EXTREMITY VENOUS (DVT) Referring Phys: MURALI RAMASWAMY  --------------------------------------------------------------------------------  Indications: Edema.  Performing Technologist: Chanda Busing RVT  Examination Guidelines: A complete evaluation includes B-mode imaging, spectral Doppler, color Doppler, and power Doppler as needed of all accessible portions of each vessel. Bilateral testing is considered an integral part of a complete examination. Limited examinations for reoccurring indications may be performed as noted. The reflux portion of the exam is performed with the patient in reverse Trendelenburg.  +---------+---------------+---------+-----------+----------+--------------+ RIGHT    CompressibilityPhasicitySpontaneityPropertiesThrombus Aging +---------+---------------+---------+-----------+----------+--------------+ CFV      Full           Yes      Yes                                 +---------+---------------+---------+-----------+----------+--------------+ SFJ      Full                                                        +---------+---------------+---------+-----------+----------+--------------+ FV Prox  Full                                                        +---------+---------------+---------+-----------+----------+--------------+ FV Mid   Full                                                        +---------+---------------+---------+-----------+----------+--------------+ FV DistalFull                                                        +---------+---------------+---------+-----------+----------+--------------+ PFV      Full                                                        +---------+---------------+---------+-----------+----------+--------------+ POP      Full           Yes      Yes                                 +---------+---------------+---------+-----------+----------+--------------+  PTV      Full                                                         +---------+---------------+---------+-----------+----------+--------------+ PERO     Full                                                        +---------+---------------+---------+-----------+----------+--------------+   +---------+---------------+---------+-----------+----------+--------------+ LEFT     CompressibilityPhasicitySpontaneityPropertiesThrombus Aging +---------+---------------+---------+-----------+----------+--------------+ CFV      Full           Yes      Yes                                 +---------+---------------+---------+-----------+----------+--------------+ SFJ      Full                                                        +---------+---------------+---------+-----------+----------+--------------+ FV Prox  Full                                                        +---------+---------------+---------+-----------+----------+--------------+ FV Mid   Full                                                        +---------+---------------+---------+-----------+----------+--------------+ FV DistalFull                                                        +---------+---------------+---------+-----------+----------+--------------+ PFV      Full                                                        +---------+---------------+---------+-----------+----------+--------------+ POP      Full           Yes      Yes                                 +---------+---------------+---------+-----------+----------+--------------+ PTV      Full                                                        +---------+---------------+---------+-----------+----------+--------------+  PERO     Full                                                        +---------+---------------+---------+-----------+----------+--------------+    Summary: RIGHT: - There is no evidence of deep vein thrombosis in the lower extremity.  - No cystic structure found in  the popliteal fossa.  LEFT: - There is no evidence of deep vein thrombosis in the lower extremity.  - No cystic structure found in the popliteal fossa.  *See table(s) above for measurements and observations.    Preliminary    CT CHEST WO CONTRAST Result Date: 04/09/2023 CLINICAL DATA:  Respiratory failure. EXAM: CT CHEST WITHOUT CONTRAST TECHNIQUE: Multidetector CT imaging of the chest was performed following the standard protocol without IV contrast. RADIATION DOSE REDUCTION: This exam was performed according to the departmental dose-optimization program which includes automated exposure control, adjustment of the mA and/or kV according to patient size and/or use of iterative reconstruction technique. COMPARISON:  04/07/2023. FINDINGS: Cardiovascular: Atherosclerotic calcification of the aorta, aortic valve and coronary arteries. Ascending aorta measures 4.0 cm (coronal image 66), stable. Enlarged pulmonic trunk and heart. Small pericardial effusion, similar. Mediastinum/Nodes: Thoracic inlet lymph nodes are not enlarged by CT size criteria. Mediastinal lymph nodes are not enlarged by CT size criteria. Hilar regions are difficult to definitively evaluate without IV contrast. No axillary adenopathy. Feeding tube in the esophagus. Lungs/Pleura: Image quality is degraded by expiratory phase imaging and respiratory motion. Centrilobular and paraseptal emphysema. Dependent atelectasis in the right lower lobe. Collapse/consolidation in the left lower lobe, as on 04/07/2023. No definite pleural fluid. Airway is unremarkable. Upper Abdomen: Feeding tube is followed into the gastric antrum but is not imaged distally. Visualized portions of the liver, adrenal glands, left kidney, spleen, pancreas, stomach and bowel are otherwise grossly unremarkable. No upper abdominal adenopathy. Musculoskeletal: Degenerative changes in the spine. Segmental lytic lesion and old fracture involving the left third posterior rib. Lucency in  the C7 vertebral body (8/3). There may be a lytic lesion in the manubrium. Additional lytic lesion in the posterior left eighth rib. IMPRESSION: 1. Similar left lower lobe collapse/consolidation, possibly due to pneumonia. 2. Scattered lytic lesions, worrisome for multiple myeloma. 3. Small pericardial effusion, similar. 4. 4.0 cm ascending aortic aneurysm. Recommend annual imaging followup by CTA or MRA. This recommendation follows 2010 ACCF/AHA/AATS/ACR/ASA/SCA/SCAI/SIR/STS/SVM Guidelines for the Diagnosis and Management of Patients with Thoracic Aortic Disease. Circulation. 2010; 121: Z610-R604. Aortic aneurysm NOS (ICD10-I71.9). 5. Aortic atherosclerosis (ICD10-I70.0). Coronary artery calcification. 6. Enlarged pulmonic trunk, indicative of pulmonary arterial hypertension. Electronically Signed   By: Leanna Battles M.D.   On: 04/09/2023 15:30   Rapid EEG Result Date: 04/09/2023 Charlsie Quest, MD     04/09/2023  8:41 AM Patient Name: Ronnie Dunn MRN: [id number] Epilepsy Attending: Charlsie Quest Referring Physician/Provider: Kalman Shan, MD Date: 04/08/2023 Duration: 2 Hours 48 mins Patient history: 63yo M with ams. EEG to evaluate for seizure Level of alertness: comatose/ lethargic AEDs during EEG study: None Technical aspects: This EEG was obtained using a 10 lead EEG system positioned circumferentially without any parasagittal coverage (rapid EEG). Computer selected EEG is reviewed as  well as background features and all clinically significant events. Description: EEG showed continuous generalized 3 to 6 Hz theta-delta slowing admixed with 12-14hz  beta activity.  Hyperventilation and photic stimulation were not performed.   ABNORMALITY - Continuous slow, generalized IMPRESSION: This limited ceribell EEG is suggestive of moderate to severe diffuse encephalopathy. No seizures or epileptiform discharges were seen throughout the recording. Charlsie Quest   DG CHEST PORT 1 VIEW Result Date:  04/08/2023 CLINICAL DATA:  Follow-up pneumothorax EXAM: PORTABLE CHEST 1 VIEW COMPARISON:  04/07/2023 FINDINGS: Cardiac shadow is enlarged but stable. Feeding catheter is noted extending towards the stomach. The lungs are well aerated bilaterally. No focal pneumothorax or effusion is seen. Mild central vascular congestion is noted. No bony abnormality is seen. IMPRESSION: Mild vascular congestion without significant edema. No evidence of pneumothorax. Electronically Signed   By: Alcide Clever M.D.   On: 04/08/2023 22:33   ECHOCARDIOGRAM COMPLETE Result Date: 04/08/2023    ECHOCARDIOGRAM REPORT   Patient Name:   Ronnie Dunn Date of Exam: 04/08/2023 Medical Rec #:  [id number]     Height:       74.0 in Accession #:    [id number]    Weight:       233.9 lb Date of Birth:  Oct 14, 1960     BSA:          2.323 m Patient Age:    62 years      BP:           183/116 mmHg Patient Gender: M             HR:           100 bpm. Exam Location:  Inpatient Procedure: 2D Echo, Cardiac Doppler and Color Doppler (Both Spectral and Color            Flow Doppler were utilized during procedure). Indications:    Acute Respiratory Distress  History:        Patient has prior history of Echocardiogram examinations, most                 recent 07/22/2022. Risk Factors:Hypertension, Diabetes, Current                 Smoker and Sleep Apnea.  Sonographer:    Karma Ganja Referring Phys: [id number] MURALI RAMASWAMY  Sonographer Comments: Technically difficult study due to poor echo windows. IMPRESSIONS  1. Left ventricular ejection fraction, by estimation, is 55 to 60%. The left ventricle has normal function. The left ventricle has no regional wall motion abnormalities. There is mild left ventricular hypertrophy. Left ventricular diastolic parameters are indeterminate.  2. Right ventricular systolic function is moderately reduced. The right ventricular size is mildly enlarged. Mildly increased right ventricular wall thickness. There is normal pulmonary  artery systolic pressure. The estimated right ventricular systolic pressure is 33.2 mmHg.  3. Left atrial size was mildly dilated.  4. Right atrial size was severely dilated.  5. A small pericardial effusion is present.  6. The mitral valve is normal in structure. No evidence of mitral valve regurgitation. No evidence of mitral stenosis.  7. The aortic valve was not well visualized. Aortic valve regurgitation is not visualized. No aortic stenosis is present.  8. The inferior vena cava is dilated in size with >50% respiratory variability, suggesting right atrial pressure of 8 mmHg. Comparison(s): A prior study was performed on 06/22/2021. RV is dilated and reduced function compared to prior study. FINDINGS  Left Ventricle: Left ventricular ejection fraction, by estimation, is 55 to 60%. The left ventricle has normal function. The left ventricle has no regional wall motion abnormalities. The left ventricular internal  cavity size was normal in size. There is  mild left ventricular hypertrophy. Left ventricular diastolic parameters are indeterminate. Right Ventricle: The right ventricular size is mildly enlarged. Mildly increased right ventricular wall thickness. Right ventricular systolic function is moderately reduced. There is normal pulmonary artery systolic pressure. The tricuspid regurgitant velocity is 2.51 m/s, and with an assumed right atrial pressure of 8 mmHg, the estimated right ventricular systolic pressure is 33.2 mmHg. Left Atrium: Left atrial size was mildly dilated. Right Atrium: Right atrial size was severely dilated. Pericardium: A small pericardial effusion is present. Mitral Valve: The mitral valve is normal in structure. No evidence of mitral valve regurgitation. No evidence of mitral valve stenosis. Tricuspid Valve: The tricuspid valve is normal in structure. Tricuspid valve regurgitation is mild . No evidence of tricuspid stenosis. Aortic Valve: The aortic valve was not well visualized. Aortic  valve regurgitation is not visualized. No aortic stenosis is present. Aortic valve mean gradient measures 3.0 mmHg. Aortic valve peak gradient measures 5.4 mmHg. Aortic valve area, by VTI measures 3.07 cm. Pulmonic Valve: The pulmonic valve was normal in structure. Pulmonic valve regurgitation is not visualized. No evidence of pulmonic stenosis. Aorta: The aortic root is normal in size and structure. Venous: The inferior vena cava is dilated in size with greater than 50% respiratory variability, suggesting right atrial pressure of 8 mmHg. IAS/Shunts: The interatrial septum was not well visualized.  LEFT VENTRICLE PLAX 2D LVIDd:         5.20 cm   Diastology LVIDs:         3.90 cm   LV e' medial:    8.45 cm/s LV PW:         1.20 cm   LV E/e' medial:  10.5 LV IVS:        1.20 cm   LV e' lateral:   6.82 cm/s LVOT diam:     2.20 cm   LV E/e' lateral: 13.0 LV SV:         51 LV SV Index:   22 LVOT Area:     3.80 cm  RIGHT VENTRICLE            IVC RV Basal diam:  4.50 cm    IVC diam: 2.40 cm RV S prime:     9.28 cm/s TAPSE (M-mode): 0.8 cm LEFT ATRIUM              Index        RIGHT ATRIUM           Index LA diam:        4.70 cm  2.02 cm/m   RA Area:     33.90 cm LA Vol (A2C):   111.0 ml 47.78 ml/m  RA Volume:   139.00 ml 59.83 ml/m LA Vol (A4C):   59.2 ml  25.48 ml/m LA Biplane Vol: 87.3 ml  37.58 ml/m  AORTIC VALVE AV Area (Vmax):    2.93 cm AV Area (Vmean):   2.71 cm AV Area (VTI):     3.07 cm AV Vmax:           116.33 cm/s AV Vmean:          77.367 cm/s AV VTI:            0.166 m AV Peak Grad:      5.4 mmHg AV Mean Grad:      3.0 mmHg LVOT Vmax:         89.62 cm/s LVOT Vmean:  55.080 cm/s LVOT VTI:          0.134 m LVOT/AV VTI ratio: 0.81  AORTA Ao Root diam: 3.90 cm MITRAL VALVE               TRICUSPID VALVE MV Area (PHT): 3.78 cm    TR Peak grad:   25.2 mmHg MV Decel Time: 201 msec    TR Vmax:        251.00 cm/s MV E velocity: 88.43 cm/s                            SHUNTS                             Systemic VTI:  0.13 m                            Systemic Diam: 2.20 cm Sunit Tolia Electronically signed by [employer] Signature Date/Time: 04/08/2023/2:44:11 PM    Final    Scheduled Meds:  arformoterol  15 mcg Nebulization BID   budesonide (PULMICORT) nebulizer solution  0.5 mg Nebulization BID   Chlorhexidine Gluconate Cloth  6 each Topical Daily   famotidine  40 mg Per Tube Daily   feeding supplement (PROSource TF20)  60 mL Per Tube BID   folic acid  1 mg Per Tube Daily   hydrALAZINE  25 mg Per Tube TID   lactulose  30 g Per Tube BID   multivitamin  15 mL Per Tube Daily   nicotine  21 mg Transdermal Daily   mouth rinse  15 mL Mouth Rinse 4 times per day   pantoprazole (PROTONIX) IV  40 mg Intravenous Q24H   revefenacin  175 mcg Nebulization Daily   rivaroxaban  20 mg Per Tube Q supper   thiamine  100 mg Oral Daily   Or   thiamine  100 mg Intravenous Daily   Continuous Infusions:  dexmedetomidine (PRECEDEX) IV infusion 0.9 mcg/kg/hr (04/10/23 1155)   dextrose 50 mL/hr at 04/10/23 1136   feeding supplement (OSMOLITE 1.5 CAL) 40 mL/hr at 04/10/23 1136   potassium PHOSPHATE IVPB (in mmol) 30 mmol (04/10/23 1020)     LOS: 7 days   Time spent: 35 min  Alwyn Ren, MD  04/10/2023, 12:32 PM

## 2023-04-10 NOTE — Plan of Care (Signed)
   Problem: Clinical Measurements: Goal: Respiratory complications will improve Outcome: Progressing   Problem: Coping: Goal: Level of anxiety will decrease Outcome: Progressing

## 2023-04-10 NOTE — Progress Notes (Signed)
 NAME:  Ronnie Dunn, MRN:  [id number], DOB:  [birth year], LOS: 7 ADMISSION DATE:  04/03/2023, CONSULTATION DATE:  04/07/23 REFERRING MD:  Jerolyn Center CHIEF COMPLAINT:  AMS   BRIEF  Ronnie Dunn is a 63 y.o. male who has a PMH as below including but not limited to COPD, OSA on CPAP, chronic diastolic CHF, HTN, HLD, PAF on Xarelto, tobacco dependence, alcohol use.  He is followed by [name] as an outpatient. He presented to [facility] ED on 3/17 with cough, fever/chills, nausea, weakness, fatigue, dyspnea x 2 weeks.  He had some confusion on and off and decreased appetite.  He had also complained of low back pain which was new and was not associated with any recent fall or trauma.  He had productive sputum, green in color.  He recently completed a course of azithromycin as an outpatient but had no relief with this.  Due to ongoing symptoms, he presented to the ED for further workup.  Initial chest x-ray showed bilateral interstitial opacities.  He was started on IV Lasix.  Renal function worsened, he had renal ultrasound on 3/19 that showed normal kidneys.  Follow-up chest x-ray showed cardiomegaly and ongoing interstitial opacities with small pleural effusions.  Diuresis was continued; however, renal function worsened further.  Was initially on vancomycin and cefepime however cefepime was stopped due to altered mental status.  This was switched to ceftriaxone on 3/19.  Overnight 3/20, he developed A-fib RVR.  He received Lopressor with improvement in heart rate.  Early a.m. 3/21 he had altered mental status and new fever to 102.9 rectally.  He was subsequently transferred to the ICU and PCCM and infectious disease were asked to see in consultation.  Upon arrival to the ICU, he is minimally responsive but is protecting his airway.  He has heart rates in the 110s to 120s, blood pressure stable.  Mucous membranes are very dry.  ABG was obtained and was normal (7.52/41/21). He has clear urine in his Foley.   Antibiotics were escalated from ceftriaxone to Zosyn.  Infectious disease has seen him and we have agreed to assess CT chest/abdomen/pelvis.  Pertinent  Medical History:  has Seizure (HCC); Hyponatremia; Alcohol use disorder, moderate, dependence (HCC); Class 1 obesity; Essential hypertension; Type 2 diabetes mellitus without complication, without long-term current use of insulin (HCC); Chronic obstructive pulmonary disease (HCC); Gastroesophageal reflux disease; Lower extremity edema; Permanent atrial fibrillation (HCC); OSA (obstructive sleep apnea); Chronic heart failure with preserved ejection fraction (HCC); Seizures (HCC); Protein-calorie malnutrition, moderate (HCC); Hypomagnesemia; Alcohol abuse; Obesity (BMI 30-39.9); Noncompliance with medication regimen; Aortic root dilation (HCC); Acquired thrombophilia (HCC); Sepsis due to pneumonia (HCC); AKI (acute kidney injury) (HCC); Acute metabolic encephalopathy; and Encephalopathy on their problem list.  Significant Hospital Events: Including procedures, antibiotic start and stop dates in addition to other pertinent events   3/17 admit 3.19 - BNPP 800 3/20 - HFNC O2 need 3/21 - Altered but protecting airway. Vitals stable.Marland Kitchen 7L [place] O2 need. BN 275 (down from 813)  - ID Consult: Fever, sepsis of unknown source--potentially due to aspiration in context of encephalopathy. Alcoholic liver disease. 3/AKI. 4Encephalopathy --presumably from alcohol withdrawal, hepatic encaphalopathy though no cirrhosis on CT at [facility] in 2019   3/22 - HEme conslt - cocern for myleoma v Waldenstrom ( .  His total protein is quite high.  His calcium is quite high.  His albumin is low.) Getting seruum viscolity due to Acue encephalpthy. Pan CT   - unremarlkable. ID thinks low odds  for infection. ID recommending MRI. RN/Wife report Obutndatioin with restlessness - on prn ativan. Last drink > 2 weeks go . Wife describes 2022 admit for Low NA - and seziure like activity but no  EEG or neuro conslt in record.  Started Precedex.  Antibiotics all stopped by ID  3/23 cerebral EEG without any seizures but showing encephalopathy.  MRI brain could not be done because patient desaturated while supine. Per RN "His blood is too thick to obtain a serum protein, parathyroid, viscosity. " Ammonia 65.    Interim History / Subjective:   Bradycardic on Precedex, lower this morning Remains on 6 L nasal cannula Afebrile Urine output 1 L, +4 L    Objective:  Blood pressure (!) 159/74, pulse (!) 54, temperature 97.6 F (36.4 C), temperature source Axillary, resp. rate 14, height 6\' 2"  (1.88 m), weight 108.2 kg, SpO2 95%.        Intake/Output Summary (Last 24 hours) at 04/10/2023 0901 Last data filed at 04/10/2023 8119 Gross per 24 hour  Intake 4897.78 ml  Output 875 ml  Net 4022.78 ml   Filed Weights   04/08/23 0100 04/09/23 0406 04/10/23 0600  Weight: 106.1 kg 106.2 kg 108.2 kg    Examination: General Appearance:  Looks criticall ill OBESE - + Head:  Normocephalic, without obvious abnormality, atraumatic Eyes:  PERRL - YES, conjunctiva/corneas - muddy     Nose:  G tube - yes Neck:  Supple,  No enlargement/tenderness/nodules Lungs: Clear breath sounds bilateral, no accessory muscle use Heart:  S1 and S2 normal, no murmur, Abdomen:  Soft, no masses, no organomegaly Genitalia / Rectal:  Not done Extremities: No deformity, no edema Skin:  ntact in exposed areas . Sacral area - not examined Neurologic: Sedate, RASS -2, does not follow commands   Labs show hypokalemia 3.1, improving BUN and creatinine 51/1.9, hypophosphatemia, albumin 1.6, elevated BNP   Labs/imaging personally reviewed:  CT head 3/19 > neg. Renal US 3/19 > neg. CT chest/abd/pelv 3/21 > infrarenal AAA Cerebral EEG 04/08/2023: No seizures. CT chest without con 3/23 collapse consolidation left lower lobe , scattered lytic lesions  Assessment & Plan:  HX OF ETOH Acute metabolic encephalopathy -  multifactorial 2/2 hyperammonemia, EtOH, presumed early developing sepsis of unclear etiology = . CT head neg, TSH, ABG normal. Folate elevated, B12 pending. -Stable EEG 04/08/2023: No seizures but shows encephalopathy. Unable to do MRI because of desaturations    PLAN   -  Regular EEG ordered for 04/10/2023 - MRI brain wo contrast (has low GFR) -> when possible and more stable  -Continue PRCEDEX, goal RASS 0 - avoid cephalosporins - PRN Haldol, trying to avoid benzodiazepines here  Concern for sepsis y - some suspicion for early aspiration PNA.  Left lower lobe collapse/consolidation oral antibiotic stopped 04/08/2023 infectious diseases  04/09/2023: Off antibiotics.  ID consult does not think patient has infectious problem  Plan - Monitor off antibiotics -Incentive spirometry   Acute hypoxic respiratory failure - 2/2 above + baseline COPD (though not on O2), OSA. -Baseline 2 L nasal cannula at night Hx COPD, OSA, tobacco dependence.  3/22 and 3/23  - protecing airway but at risk for decompensation.  Needing 6 L oxygen off for several days..  Encephalopathy prevents BiPAP or CPAP  PLNA - Continue supplemental O2 as needed to maintain SpO2 > 92%. -triple therapy nebs in lieu of PTA Trelegy. -Smoking cessation counseling at discharge  AKI. Improving  PLAN Per renal   Hypercalcemia Hypokalemia High-protein,Hypoalbiuminema Hyperviscosity -  lab unable to sample many test. 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 Seen by Dr Myna Hidalgo 04/08/23  Plan  - - calcitonin per heme - 24h UPEP/UIFFE.Light chainrs -in progress and pending -Get Doppler lower extremity 04/10/23 (continue Xarelto for A-fib]   Hx dCHF, A.fib on Xarelto, HTN, HLD.  -Echo 04/08/2023: Normal left ventricle ejection fraction but right ventricular dysfunction present and is new compared to July 2024. Currently on schedled lopressr and hydralazine.  Having bradycardia with  Precedex.  PLAN -Lasix x 1 -Continue hydralazine scheduled - Continue Xarelto. -Might need right heart catheterization or reassessment of RV in the future   Best practice (evaluated daily):  Diet/type: NPO -RD consultation for tube feeds DVT prophylaxis: DOAC Xarelto Pressure ulcer(s): pressure ulcer assessment deferred  GI prophylaxis: Pepcid Lines: N/A Foley:  N/A Code Status:  full code Last date of multidisciplinary goals of care discussion: None yet.     ATTESTATION & SIGNATURE   The patient RONDO SPITTLER is critically ill with multiple organ systems failure and requires high complexity decision making for assessment and support, frequent evaluation and titration of therapies, application of advanced monitoring technologies and extensive interpretation of multiple databases and discussion with other appropriate health care personnel such as bedside nurses, social workers, case Production designer, theatre/television/film, consultants, respiratory therapists, nutritionists, secretaries etc.,  Critical care time includes but is not restricted to just documentation time. Documentation can happen in parallel or sequential to care time depending on case mix urgency and priorities for the shift. So, overall critical Care Time devoted to patient care services described in this note is  31  Minutes.   This time reflects time of care of this signee Dr Kalman Shan which includ does not reflect procedure time, or teaching time or supervisory time of PA/NP/Med student/Med Resident etc but could involve care discussion time    Cyril Mourning MD. [facility]. [facility] Pager : [phone]  If no response to pager , please call [phone] until 7 pm After 7:00 pm call Elink  [phone]   04/10/2023   04/10/2023 9:01 AM     LABS    PULMONARY Recent Labs  Lab 04/03/23 1713 04/06/23 1633 04/07/23 1026  PHART 7.55*  --  7.52*  PCO2ART 41  --  41  PO2ART 70*  --  101  HCO3 35.9* 32.8* 33.1*   O2SAT 95.1 91.9 98.5    CBC Recent Labs  Lab 04/05/23 0735 04/07/23 1251 04/08/23 0830  HGB 11.8* 13.1 12.2*  HCT 37.7* 43.4 39.2  WBC 7.9 9.8 8.9  PLT 346 363 289    COAGULATION Recent Labs  Lab 04/03/23 1118  INR 1.9*    CARDIAC  No results for input(s): "TROPONINI" in the last 168 hours. No results for input(s): "PROBNP" in the last 168 hours.   CHEMISTRY Recent Labs  Lab 04/05/23 0735 04/06/23 0515 04/06/23 0948 04/07/23 0508 04/08/23 0311 04/08/23 0830 04/09/23 0555 04/09/23 1332 04/09/23 1619 04/10/23 0536 04/10/23 0537  NA 136   < > 142 145  --  146* 146*  --   --  145  --   K 2.8*   < > 2.8* 3.2*  --  2.7* 3.1*  --   --  3.1*  --   CL 96*   < > 100 107  --  108 111  --   --  114*  --   CO2 28   < > 25 23  --  26 25  --   --  24  --   GLUCOSE 108*   < > 120* 120*  --  123* 139*  --   --  196*  --   BUN 30*   < > 33* 43*  --  58* 60*  --   --  51*  --   CREATININE 1.97*   < > 2.10* 2.37*  --  2.91* 2.40*  --   --  1.96*  --   CALCIUM 11.8*   < > 12.3* 12.2*  --  11.0* 9.7  --   --  9.2  --   MG 1.9  --   --   --  1.8  --   --  1.8 1.6*  --  2.1  PHOS 2.0*  --  3.9  --   --  2.5 3.1 2.8 2.5 2.0* 1.9*   < > = values in this interval not displayed.   Estimated Creatinine Clearance: 50.5 mL/min (A) (by C-G formula based on SCr of 1.96 mg/dL (H)).   LIVER Recent Labs  Lab 04/03/23 1118 04/06/23 0948 04/07/23 0508 04/08/23 0830 04/09/23 0555 04/10/23 0536  AST 18 27 26 22   --   --   ALT 5 5 9 7   --   --   ALKPHOS 70 70 72 77  --   --   BILITOT 0.8 0.8 1.1 1.0  --   --   PROT 10.7* 11.1* 11.2* 10.8*  --   --   ALBUMIN 2.1* 2.0* 2.2* 1.8* 1.7* 1.6*  INR 1.9*  --   --   --   --   --      INFECTIOUS Recent Labs  Lab 04/07/23 1315 04/07/23 1552 04/08/23 2359 04/09/23 0555  LATICACIDVEN 2.0* 2.4* 1.3 1.2  PROCALCITON 1.34  --   --   --      ENDOCRINE CBG (last 3)  Recent Labs    04/10/23 0008 04/10/23 0403 04/10/23 0816   GLUCAP 175* 172* 179*

## 2023-04-10 NOTE — Progress Notes (Signed)
 Two RNs attempted to place bridle on Cortrak per protocol. Magnets unable to lock after numerous attempts. Tube remains taped in place to nose at this time.

## 2023-04-10 NOTE — Consult Note (Signed)
 Chief Complaint: Weakness, confusion, back pain, lytic bony lesions, renal insufficiency, hypercalcemia; referred for image guided bone marrow biopsy to rule out myeloma  Referring Provider(s): Mathews,E/Ennever,P  Supervising Physician: Roanna Banning  Patient Status: [facility] - In-pt  History of Present Illness: Ronnie Dunn is a 63 y.o. male with PMH sig for CHF, COPD, obesity, HTN, HLD, GERD, OSA, paroxysmal afib on Xarelto, thoracic aortic aneurysm, alcohol/tobacco abuse, DM who  was admitted to [facility] on 04/03/23 with complaints of productive cough, fever/chills, confusion, back pain, nausea, progressive weakness/fatigue and shortness of breath over the last 2 weeks. CT C/A/P revealed:  1.  Small bilateral pleural effusions.   2.  Small pericardial effusion.   3. Infrarenal abdominal aortic aneurysm measuring up to 4.2 x 3.5 cm in caliber. Recommend follow-up CT or MR as appropriate in 12 months and referral to or continued care with vascular specialist. (Ref.: J Vasc Surg. 2018; 67:2-77 and J Am Coll Radiol 2013;10(10):789-794.)   4.  Coronary artery disease.  Latest CT chest done on 3/23 revealed:   1. Similar left lower lobe collapse/consolidation, possibly due to pneumonia. 2. Scattered lytic lesions, worrisome for multiple myeloma. 3. Small pericardial effusion, similar. 4. 4.0 cm ascending aortic aneurysm. Recommend annual imaging followup by CTA or MRA. This recommendation follows 2010 ACCF/AHA/AATS/ACR/ASA/SCA/SCAI/SIR/STS/SVM Guidelines for the Diagnosis and Management of Patients with Thoracic Aortic Disease. Circulation. 2010; 121: W098-J191. Aortic aneurysm NOS (ICD10-I71.9). 5. Aortic atherosclerosis (ICD10-I70.0). Coronary artery calcification. 6. Enlarged pulmonic trunk, indicative of pulmonary arterial hypertension.  Pt also noted to be hypercalcemic with renal insufficiency, elevated total protein; oncology has recommended image guided bone marrow biopsy to  rule out myeloma/Waldenstrom's;pt afebrile, on apresoline and precedex    Patient is Full Code  Past Medical History:  Diagnosis Date   Chronic heart failure with preserved ejection fraction (HCC) 06/01/2021   Chronic obstructive pulmonary disease (HCC) 05/28/2021   Class 1 obesity 03/29/2020   Essential hypertension 03/29/2020   Gastroesophageal reflux disease 05/28/2021   Hyponatremia 03/29/2020   OSA (obstructive sleep apnea) 06/01/2021   Permanent atrial fibrillation (HCC) 05/28/2021   Thoracic aortic aneurysm without rupture (HCC) 06/01/2021   Type 2 diabetes mellitus without complication, without long-term current use of insulin (HCC) 05/28/2021    Past Surgical History:  Procedure Laterality Date   BIOPSY  04/05/2022   Procedure: BIOPSY;  Surgeon: Kathi Der, MD;  Location: [facility];  Service: Gastroenterology;;   COLONOSCOPY WITH PROPOFOL N/A 04/05/2022   Procedure: COLONOSCOPY WITH PROPOFOL;  Surgeon: Kathi Der, MD;  Location: [facility];  Service: Gastroenterology;  Laterality: N/A;   ESOPHAGOGASTRODUODENOSCOPY (EGD) WITH PROPOFOL N/A 04/05/2022   Procedure: ESOPHAGOGASTRODUODENOSCOPY (EGD) WITH PROPOFOL;  Surgeon: Kathi Der, MD;  Location: [facility];  Service: Gastroenterology;  Laterality: N/A;   POLYPECTOMY  04/05/2022   Procedure: POLYPECTOMY;  Surgeon: Kathi Der, MD;  Location: [facility];  Service: Gastroenterology;;    Allergies: Patient has no known allergies.  Medications: Prior to Admission medications   Medication Sig Start Date End Date Taking? Authorizing Provider  albuterol (VENTOLIN HFA) 108 (90 Base) MCG/ACT inhaler Inhale 2 puffs into the lungs every 4 (four) hours as needed for wheezing or shortness of breath. 04/21/21  Yes [provider]  allopurinol (ZYLOPRIM) 300 MG tablet Take 300 mg by mouth daily as needed (gout). 08/02/22  Yes [provider]  atorvastatin (LIPITOR) 10 MG tablet Take 1  tablet (10 mg total) by mouth daily. 09/06/21  Yes Mayer Masker, PA-C  B  Complex-C (SUPER B COMPLEX PO) Take 1 tablet by mouth daily.   Yes [provider]  cetirizine (ZYRTEC) 10 MG tablet Take 10 mg by mouth daily. 02/14/23  Yes [provider]  Cholecalciferol (VITAMIN D3) 50 MCG (2000 UT) TABS Take 2,000 Units by mouth daily at 6 (six) AM.   Yes [provider]  colchicine 0.6 MG tablet Take 1 tablet (0.6 mg total) by mouth 2 (two) times daily. Patient taking differently: Take 0.6 mg by mouth as needed (gout). 03/15/22 04/04/23 Yes Dyann Kief, PA-C  dapagliflozin propanediol (FARXIGA) 10 MG TABS tablet Take 1 tablet (10 mg total) by mouth daily before breakfast. 06/01/21  Yes Chandrasekhar, Mahesh A, MD  famotidine (PEPCID) 40 MG tablet Take 40 mg by mouth daily.   Yes [provider]  Fluticasone-Umeclidin-Vilant (TRELEGY ELLIPTA) 100-62.5-25 MCG/ACT AEPB Inhale 1 puff into the lungs daily. 09/16/21  Yes Mayer Masker, PA-C  folic acid (FOLVITE) 1 MG tablet Take 1 tablet by mouth once daily 10/26/21  Yes Abonza, Maritza, PA-C  furosemide (LASIX) 80 MG tablet Take 40-120 mg by mouth daily as needed for fluid or edema.   Yes [provider]  hydrALAZINE (APRESOLINE) 50 MG tablet Take 1 tablet (50 mg total) by mouth 3 (three) times daily. 11/01/21  Yes Abonza, Maritza, PA-C  lisinopril (ZESTRIL) 5 MG tablet Take 1 tablet by mouth once daily 10/26/21  Yes Abonza, Maritza, PA-C  magnesium oxide (MAG-OX) 400 MG tablet Take 400 mg by mouth daily.   Yes [provider]  metoprolol succinate (TOPROL-XL) 100 MG 24 hr tablet Take 100 mg by mouth daily. 05/10/21  Yes [provider]  Multiple Vitamins-Minerals (MULTIVITAMIN WITH MINERALS) tablet Take 1 tablet by mouth daily.   Yes [provider]  pantoprazole (PROTONIX) 40 MG tablet Take 1 tablet by mouth once daily Patient taking differently: Take 40 mg by mouth at bedtime.  10/26/21  Yes Abonza, Maritza, PA-C  rivaroxaban (XARELTO) 20 MG TABS tablet TAKE 1 TABLET BY MOUTH ONCE DAILY WITH SUPPER 02/23/23  Yes Chandrasekhar, Mahesh A, MD  sodium chloride (OCEAN) 0.65 % SOLN nasal spray Place 1 spray into both nostrils as needed for congestion.   Yes [provider]  Tetrahydrozoline-Zn Sulfate (EYE DROPS RELIEF OP) Place 1 drop into both eyes daily as needed (dry eyes).   Yes [provider]  traZODone (DESYREL) 100 MG tablet Take 100 mg by mouth at bedtime.   Yes [provider]  Turmeric (QC TUMERIC COMPLEX) 500 MG CAPS Take 500 mg by mouth daily at 6 (six) AM.   Yes [provider]  vitamin E 180 MG (400 UNITS) capsule Take 400 Units by mouth daily.   Yes [provider]  budesonide (PULMICORT) 0.5 MG/2ML nebulizer solution Take 0.5 mg by nebulization daily. Patient not taking: Reported on 04/04/2023 12/16/22   [provider]  spironolactone (ALDACTONE) 25 MG tablet Take 1 tablet (25 mg total) by mouth daily. 08/12/21   Mayer Masker, PA-C     Family History  Problem Relation Age of Onset   Hypertension Other     Social History   Socioeconomic History   Marital status: Married    Spouse name: Bjorn Loser   Number of children: 2   Years of education: Highschool Graduate   Highest education level: Not on file  Occupational History   Not on file  Tobacco Use   Smoking status: Every Day    Current packs/day: 0.50  Average packs/day: 0.5 packs/day for 47.2 years (23.6 ttl pk-yrs)    Types: Cigarettes    Start date: 1978   Smokeless tobacco: Never  Vaping Use   Vaping status: Never Used  Substance and Sexual Activity   Alcohol use: Yes    Alcohol/week: 20.0 standard drinks of alcohol    Types: 20 Cans of beer per week   Drug use: Not Currently   Sexual activity: Yes    Partners: Female    Birth control/protection: None  Other Topics Concern   Not on file  Social History Narrative   ** Merged  History Encounter **       Social Drivers of Health   Financial Resource Strain: Not on file  Food Insecurity: No Food Insecurity (04/03/2023)   Hunger Vital Sign    Worried About Running Out of Food in the Last Year: Never true    Ran Out of Food in the Last Year: Never true  Transportation Needs: No Transportation Needs (04/03/2023)   PRAPARE - Administrator, Civil Service (Medical): No    Lack of Transportation (Non-Medical): No  Physical Activity: Not on file  Stress: Not on file  Social Connections: Moderately Isolated (04/03/2023)   Social Connection and Isolation Panel [NHANES]    Frequency of Communication with Friends and Family: Three times a week    Frequency of Social Gatherings with Friends and Family: Once a week    Attends Religious Services: Never    Database administrator or Organizations: No    Attends Engineer, structural: Never    Marital Status: Married       Review of Systems; unable to obtain from pt secondary to lethargy/sedation  Vital Signs: BP (!) 147/93   Pulse (!) 52   Temp 99.3 F (37.4 C) (Axillary)   Resp 15   Ht 6\' 2"  (1.88 m)   Wt 238 lb 8.6 oz (108.2 kg)   SpO2 94%   BMI 30.63 kg/m   Advance Care Plan: no documents on file    Physical Exam; pt lethargic/sedated; ill appearing; wife at bedside; NG tube in place; chest-clear ant bilat; heart- irreg, bradycardic rate; abd- obese, soft,few BS; mild pretibial edema bilat  Imaging: VAS [place] LOWER EXTREMITY VENOUS (DVT) Result Date: 04/10/2023  Lower Venous DVT Study Patient Name:  Ronnie Dunn  Date of Exam:   04/10/2023 Medical Rec #: [id number]      Accession #:    [id number] Date of Birth: [birth year]      Patient Gender: M Patient Age:   20 years Exam Location:  [facility] Procedure:      VAS [place] LOWER EXTREMITY VENOUS (DVT) Referring Phys: MURALI RAMASWAMY --------------------------------------------------------------------------------  Indications: Edema.   Performing Technologist: Chanda Busing RVT  Examination Guidelines: A complete evaluation includes B-mode imaging, spectral Doppler, color Doppler, and power Doppler as needed of all accessible portions of each vessel. Bilateral testing is considered an integral part of a complete examination. Limited examinations for reoccurring indications may be performed as noted. The reflux portion of the exam is performed with the patient in reverse Trendelenburg.  +---------+---------------+---------+-----------+----------+--------------+ RIGHT    CompressibilityPhasicitySpontaneityPropertiesThrombus Aging +---------+---------------+---------+-----------+----------+--------------+ CFV      Full           Yes      Yes                                 +---------+---------------+---------+-----------+----------+--------------+  SFJ      Full                                                        +---------+---------------+---------+-----------+----------+--------------+ FV Prox  Full                                                        +---------+---------------+---------+-----------+----------+--------------+ FV Mid   Full                                                        +---------+---------------+---------+-----------+----------+--------------+ FV DistalFull                                                        +---------+---------------+---------+-----------+----------+--------------+ PFV      Full                                                        +---------+---------------+---------+-----------+----------+--------------+ POP      Full           Yes      Yes                                 +---------+---------------+---------+-----------+----------+--------------+ PTV      Full                                                        +---------+---------------+---------+-----------+----------+--------------+ PERO     Full                                                         +---------+---------------+---------+-----------+----------+--------------+   +---------+---------------+---------+-----------+----------+--------------+ LEFT     CompressibilityPhasicitySpontaneityPropertiesThrombus Aging +---------+---------------+---------+-----------+----------+--------------+ CFV      Full           Yes      Yes                                 +---------+---------------+---------+-----------+----------+--------------+ SFJ      Full                                                        +---------+---------------+---------+-----------+----------+--------------+  FV Prox  Full                                                        +---------+---------------+---------+-----------+----------+--------------+ FV Mid   Full                                                        +---------+---------------+---------+-----------+----------+--------------+ FV DistalFull                                                        +---------+---------------+---------+-----------+----------+--------------+ PFV      Full                                                        +---------+---------------+---------+-----------+----------+--------------+ POP      Full           Yes      Yes                                 +---------+---------------+---------+-----------+----------+--------------+ PTV      Full                                                        +---------+---------------+---------+-----------+----------+--------------+ PERO     Full                                                        +---------+---------------+---------+-----------+----------+--------------+     Summary: RIGHT: - There is no evidence of deep vein thrombosis in the lower extremity.  - No cystic structure found in the popliteal fossa.  LEFT: - There is no evidence of deep vein thrombosis in the lower extremity.  - No cystic structure  found in the popliteal fossa.  *See table(s) above for measurements and observations. Electronically signed by Gerarda Fraction on 04/10/2023 at 6:15:38 PM.    Final    CT CHEST WO CONTRAST Result Date: 04/09/2023 CLINICAL DATA:  Respiratory failure. EXAM: CT CHEST WITHOUT CONTRAST TECHNIQUE: Multidetector CT imaging of the chest was performed following the standard protocol without IV contrast. RADIATION DOSE REDUCTION: This exam was performed according to the departmental dose-optimization program which includes automated exposure control, adjustment of the mA and/or kV according to patient size and/or use of iterative reconstruction technique. COMPARISON:  04/07/2023. FINDINGS: Cardiovascular: Atherosclerotic calcification of the aorta, aortic valve and coronary arteries. Ascending aorta measures 4.0 cm (coronal image 66), stable. Enlarged pulmonic trunk and heart.  Small pericardial effusion, similar. Mediastinum/Nodes: Thoracic inlet lymph nodes are not enlarged by CT size criteria. Mediastinal lymph nodes are not enlarged by CT size criteria. Hilar regions are difficult to definitively evaluate without IV contrast. No axillary adenopathy. Feeding tube in the esophagus. Lungs/Pleura: Image quality is degraded by expiratory phase imaging and respiratory motion. Centrilobular and paraseptal emphysema. Dependent atelectasis in the right lower lobe. Collapse/consolidation in the left lower lobe, as on 04/07/2023. No definite pleural fluid. Airway is unremarkable. Upper Abdomen: Feeding tube is followed into the gastric antrum but is not imaged distally. Visualized portions of the liver, adrenal glands, left kidney, spleen, pancreas, stomach and bowel are otherwise grossly unremarkable. No upper abdominal adenopathy. Musculoskeletal: Degenerative changes in the spine. Segmental lytic lesion and old fracture involving the left third posterior rib. Lucency in the C7 vertebral body (8/3). There may be a lytic lesion in  the manubrium. Additional lytic lesion in the posterior left eighth rib. IMPRESSION: 1. Similar left lower lobe collapse/consolidation, possibly due to pneumonia. 2. Scattered lytic lesions, worrisome for multiple myeloma. 3. Small pericardial effusion, similar. 4. 4.0 cm ascending aortic aneurysm. Recommend annual imaging followup by CTA or MRA. This recommendation follows 2010 ACCF/AHA/AATS/ACR/ASA/SCA/SCAI/SIR/STS/SVM Guidelines for the Diagnosis and Management of Patients with Thoracic Aortic Disease. Circulation. 2010; 121: Z610-R604. Aortic aneurysm NOS (ICD10-I71.9). 5. Aortic atherosclerosis (ICD10-I70.0). Coronary artery calcification. 6. Enlarged pulmonic trunk, indicative of pulmonary arterial hypertension. Electronically Signed   By: Leanna Battles M.D.   On: 04/09/2023 15:30   Rapid EEG Result Date: 04/09/2023 Charlsie Quest, MD     04/09/2023  8:41 AM Patient Name: Ronnie Dunn MRN: [id number] Epilepsy Attending: Charlsie Quest Referring Physician/Provider: Kalman Shan, MD Date: 04/08/2023 Duration: 2 Hours 48 mins Patient history: 63yo M with ams. EEG to evaluate for seizure Level of alertness: comatose/ lethargic AEDs during EEG study: None Technical aspects: This EEG was obtained using a 10 lead EEG system positioned circumferentially without any parasagittal coverage (rapid EEG). Computer selected EEG is reviewed as  well as background features and all clinically significant events. Description: EEG showed continuous generalized 3 to 6 Hz theta-delta slowing admixed with 12-14hz  beta activity. Hyperventilation and photic stimulation were not performed.   ABNORMALITY - Continuous slow, generalized IMPRESSION: This limited ceribell EEG is suggestive of moderate to severe diffuse encephalopathy. No seizures or epileptiform discharges were seen throughout the recording. Charlsie Quest   DG CHEST PORT 1 VIEW Result Date: 04/08/2023 CLINICAL DATA:  Follow-up pneumothorax EXAM: PORTABLE  CHEST 1 VIEW COMPARISON:  04/07/2023 FINDINGS: Cardiac shadow is enlarged but stable. Feeding catheter is noted extending towards the stomach. The lungs are well aerated bilaterally. No focal pneumothorax or effusion is seen. Mild central vascular congestion is noted. No bony abnormality is seen. IMPRESSION: Mild vascular congestion without significant edema. No evidence of pneumothorax. Electronically Signed   By: Alcide Clever M.D.   On: 04/08/2023 22:33   ECHOCARDIOGRAM COMPLETE Result Date: 04/08/2023    ECHOCARDIOGRAM REPORT   Patient Name:   Ronnie Dunn Date of Exam: 04/08/2023 Medical Rec #:  [id number]     Height:       74.0 in Accession #:    [id number]    Weight:       233.9 lb Date of Birth:  [birth year]     BSA:          2.323 m Patient Age:    62 years      BP:  183/116 mmHg Patient Gender: M             HR:           100 bpm. Exam Location:  Inpatient Procedure: 2D Echo, Cardiac Doppler and Color Doppler (Both Spectral and Color            Flow Doppler were utilized during procedure). Indications:    Acute Respiratory Distress  History:        Patient has prior history of Echocardiogram examinations, most                 recent 07/22/2022. Risk Factors:Hypertension, Diabetes, Current                 Smoker and Sleep Apnea.  Sonographer:    Karma Ganja Referring Phys: 78 MURALI RAMASWAMY  Sonographer Comments: Technically difficult study due to poor echo windows. IMPRESSIONS  1. Left ventricular ejection fraction, by estimation, is 55 to 60%. The left ventricle has normal function. The left ventricle has no regional wall motion abnormalities. There is mild left ventricular hypertrophy. Left ventricular diastolic parameters are indeterminate.  2. Right ventricular systolic function is moderately reduced. The right ventricular size is mildly enlarged. Mildly increased right ventricular wall thickness. There is normal pulmonary artery systolic pressure. The estimated right ventricular systolic  pressure is 33.2 mmHg.  3. Left atrial size was mildly dilated.  4. Right atrial size was severely dilated.  5. A small pericardial effusion is present.  6. The mitral valve is normal in structure. No evidence of mitral valve regurgitation. No evidence of mitral stenosis.  7. The aortic valve was not well visualized. Aortic valve regurgitation is not visualized. No aortic stenosis is present.  8. The inferior vena cava is dilated in size with >50% respiratory variability, suggesting right atrial pressure of 8 mmHg. Comparison(s): A prior study was performed on 06/22/2021. RV is dilated and reduced function compared to prior study. FINDINGS  Left Ventricle: Left ventricular ejection fraction, by estimation, is 55 to 60%. The left ventricle has normal function. The left ventricle has no regional wall motion abnormalities. The left ventricular internal cavity size was normal in size. There is  mild left ventricular hypertrophy. Left ventricular diastolic parameters are indeterminate. Right Ventricle: The right ventricular size is mildly enlarged. Mildly increased right ventricular wall thickness. Right ventricular systolic function is moderately reduced. There is normal pulmonary artery systolic pressure. The tricuspid regurgitant velocity is 2.51 m/s, and with an assumed right atrial pressure of 8 mmHg, the estimated right ventricular systolic pressure is 33.2 mmHg. Left Atrium: Left atrial size was mildly dilated. Right Atrium: Right atrial size was severely dilated. Pericardium: A small pericardial effusion is present. Mitral Valve: The mitral valve is normal in structure. No evidence of mitral valve regurgitation. No evidence of mitral valve stenosis. Tricuspid Valve: The tricuspid valve is normal in structure. Tricuspid valve regurgitation is mild . No evidence of tricuspid stenosis. Aortic Valve: The aortic valve was not well visualized. Aortic valve regurgitation is not visualized. No aortic stenosis is present.  Aortic valve mean gradient measures 3.0 mmHg. Aortic valve peak gradient measures 5.4 mmHg. Aortic valve area, by VTI measures 3.07 cm. Pulmonic Valve: The pulmonic valve was normal in structure. Pulmonic valve regurgitation is not visualized. No evidence of pulmonic stenosis. Aorta: The aortic root is normal in size and structure. Venous: The inferior vena cava is dilated in size with greater than 50% respiratory variability, suggesting right atrial pressure of 8 mmHg.  IAS/Shunts: The interatrial septum was not well visualized.  LEFT VENTRICLE PLAX 2D LVIDd:         5.20 cm   Diastology LVIDs:         3.90 cm   LV e' medial:    8.45 cm/s LV PW:         1.20 cm   LV E/e' medial:  10.5 LV IVS:        1.20 cm   LV e' lateral:   6.82 cm/s LVOT diam:     2.20 cm   LV E/e' lateral: 13.0 LV SV:         51 LV SV Index:   22 LVOT Area:     3.80 cm  RIGHT VENTRICLE            IVC RV Basal diam:  4.50 cm    IVC diam: 2.40 cm RV S prime:     9.28 cm/s TAPSE (M-mode): 0.8 cm LEFT ATRIUM              Index        RIGHT ATRIUM           Index LA diam:        4.70 cm  2.02 cm/m   RA Area:     33.90 cm LA Vol (A2C):   111.0 ml 47.78 ml/m  RA Volume:   139.00 ml 59.83 ml/m LA Vol (A4C):   59.2 ml  25.48 ml/m LA Biplane Vol: 87.3 ml  37.58 ml/m  AORTIC VALVE AV Area (Vmax):    2.93 cm AV Area (Vmean):   2.71 cm AV Area (VTI):     3.07 cm AV Vmax:           116.33 cm/s AV Vmean:          77.367 cm/s AV VTI:            0.166 m AV Peak Grad:      5.4 mmHg AV Mean Grad:      3.0 mmHg LVOT Vmax:         89.62 cm/s LVOT Vmean:        55.080 cm/s LVOT VTI:          0.134 m LVOT/AV VTI ratio: 0.81  AORTA Ao Root diam: 3.90 cm MITRAL VALVE               TRICUSPID VALVE MV Area (PHT): 3.78 cm    TR Peak grad:   25.2 mmHg MV Decel Time: 201 msec    TR Vmax:        251.00 cm/s MV E velocity: 88.43 cm/s                            SHUNTS                            Systemic VTI:  0.13 m                            Systemic Diam: 2.20 cm  Sunit Tolia Electronically signed by Tessa Lerner Signature Date/Time: 04/08/2023/2:44:11 PM    Final    CT CHEST ABDOMEN PELVIS WO CONTRAST Result Date: 04/07/2023 CLINICAL DATA:  Sepsis EXAM: CT CHEST, ABDOMEN AND PELVIS WITHOUT CONTRAST TECHNIQUE: Multidetector CT imaging of the chest, abdomen and pelvis was performed following  the standard protocol without IV contrast. RADIATION DOSE REDUCTION: This exam was performed according to the departmental dose-optimization program which includes automated exposure control, adjustment of the mA and/or kV according to patient size and/or use of iterative reconstruction technique. COMPARISON:  None Available. FINDINGS: CT CHEST FINDINGS Cardiovascular: Aortic atherosclerosis. Cardiomegaly. Three-vessel coronary artery calcifications. Small pericardial effusion. Mediastinum/Nodes: No enlarged mediastinal, hilar, or axillary lymph nodes. Thyroid gland, trachea, and esophagus demonstrate no significant findings. Lungs/Pleura: Mild centrilobular and paraseptal emphysema. Small bilateral pleural effusions Musculoskeletal: No chest wall abnormality. No acute osseous findings. CT ABDOMEN PELVIS FINDINGS Hepatobiliary: No solid liver abnormality is seen. No gallstones, gallbladder wall thickening, or biliary dilatation. Pancreas: Unremarkable. No pancreatic ductal dilatation or surrounding inflammatory changes. Spleen: Normal in size without significant abnormality. Adrenals/Urinary Tract: Adrenal glands are unremarkable. Kidneys are normal, without renal calculi, solid lesion, or hydronephrosis. Bladder is decompressed by Foley catheter. Stomach/Bowel: Stomach is within normal limits. Esophagogastric tube with tip in the proximal duodenal. Appendix appears normal. No evidence of bowel wall thickening, distention, or inflammatory changes. Vascular/Lymphatic: Aortic atherosclerosis. Infrarenal abdominal aortic aneurysm measuring up to 4.2 x 3.5 cm in caliber (series 2, image 69).  No enlarged abdominal or pelvic lymph nodes. Reproductive: No mass or other abnormality. Other: Small fat containing inguinal hernias.  No ascites. Musculoskeletal: No acute osseous findings. IMPRESSION: 1.  Small bilateral pleural effusions. 2.  Small pericardial effusion. 3. Infrarenal abdominal aortic aneurysm measuring up to 4.2 x 3.5 cm in caliber. Recommend follow-up CT or MR as appropriate in 12 months and referral to or continued care with vascular specialist. (Ref.: J Vasc Surg. 2018; 67:2-77 and J Am Coll Radiol 2013;10(10):789-794.) 4.  Coronary artery disease. Aortic Atherosclerosis (ICD10-I70.0) and Emphysema (ICD10-J43.9). Electronically Signed   By: Jearld Lesch M.D.   On: 04/07/2023 17:50   DG Chest 1 View Result Date: 04/07/2023 CLINICAL DATA:  Hypoxia. EXAM: CHEST  1 VIEW COMPARISON:  April 06, 2023. FINDINGS: Stable cardiomegaly. Right lung is clear. Minimal left basilar subsegmental atelectasis is noted. Bony thorax is unremarkable. IMPRESSION: Minimal left basilar subsegmental atelectasis. Electronically Signed   By: Lupita Raider M.D.   On: 04/07/2023 13:14   DG Abd Portable 1V Result Date: 04/07/2023 CLINICAL DATA:  NG tube placement EXAM: PORTABLE ABDOMEN - 1 VIEW COMPARISON:  None Available. FINDINGS: Limited x-ray for tube placement has feeding tube with tip extending to the right mid abdomen, possibly distal stomach or proximal duodenum. There are air-filled loops of distended bowel throughout the mid abdomen including some mildly dilated loops. Please correlate with history. Enlarged heart. IMPRESSION: Limited x-ray for tube placement has feeding tube extending to the right side of the abdomen, possibly distal stomach or proximal duodenum Electronically Signed   By: Karen Kays M.D.   On: 04/07/2023 13:00   DG Chest 1 View Result Date: 04/06/2023 CLINICAL DATA:  Hypoxia. EXAM: CHEST  1 VIEW COMPARISON:  Radiograph 04/03/2023 FINDINGS: The heart is enlarged. Increase in  diffuse bilateral interstitial and airspace opacities. Suspected small pleural effusions, left greater than right. No pneumothorax. IMPRESSION: 1. Cardiomegaly with diffuse bilateral interstitial and airspace opacities, increased from prior. Findings may represent pulmonary edema or multifocal pneumonia. 2. Suspected small pleural effusions, left greater than right. Electronically Signed   By: Narda Rutherford M.D.   On: 04/06/2023 18:45   US RENAL Result Date: 04/05/2023 CLINICAL DATA:  Abdominal pain. EXAM: RENAL / URINARY TRACT ULTRASOUND COMPLETE COMPARISON:  None Available. FINDINGS: Right Kidney: Renal measurements: 11.4 x  7.1 x 5 cm = volume: 210 mL. Normal parenchymal echogenicity. No hydronephrosis. No visualized stone or focal lesion. Left Kidney: Renal measurements: 14 x 7.6 x 5.6 cm = volume: 313 mL. Normal parenchymal echogenicity. No hydronephrosis. No visualized stone or focal lesion. Bladder: Not visualized.  The patient voided prior to the exam. Other: None. IMPRESSION: 1. Normal sonographic appearance of the kidneys. 2. Urinary bladder is decompressed and not assessed on the current exam. Electronically Signed   By: Narda Rutherford M.D.   On: 04/05/2023 19:45   CT HEAD WO CONTRAST ( ) Result Date: 04/05/2023 CLINICAL DATA:  Initial evaluation for mental status change, unknown cause. EXAM: CT HEAD WITHOUT CONTRAST TECHNIQUE: Contiguous axial images were obtained from the base of the skull through the vertex without intravenous contrast. RADIATION DOSE REDUCTION: This exam was performed according to the departmental dose-optimization program which includes automated exposure control, adjustment of the mA and/or kV according to patient size and/or use of iterative reconstruction technique. COMPARISON:  CT from 06/21/2021 FINDINGS: Brain: Cerebral volume within normal limits. 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 No acute  intracranial hemorrhage. No acute large vessel territory infarct. No mass lesion or midline shift. No hydrocephalus or extra-axial fluid collection. Vascular: No abnormal hyperdense vessel. Calcified atherosclerosis present at the skull base. Skull: Scalp soft tissues within normal limits.  Calvarium intact. Sinuses/Orbits: Globes and orbital soft tissues within normal limits. Paranasal sinuses are largely clear. No significant mastoid effusion. Other: None. IMPRESSION: 1. No acute intracranial abnormality. 2. 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 Electronically Signed   By: Rise Mu M.D.   On: 04/05/2023 19:27   MR Lumbar Spine W Wo Contrast Result Date: 04/05/2023 CLINICAL DATA:  Acute low back pain for the past 2 weeks. EXAM: MRI LUMBAR SPINE WITHOUT AND WITH CONTRAST TECHNIQUE: Multiplanar and multiecho pulse sequences of the lumbar spine were obtained without and with intravenous contrast. CONTRAST:  10mL GADAVIST GADOBUTROL 1 MMOL/ML IV SOLN COMPARISON:  None Available. FINDINGS: Segmentation: Assumed standard. The last well-formed disc space is designated L5-S1 for the purposes of this report. Alignment:  Physiologic. Vertebrae: No fracture or evidence of discitis. Decreased T2 and T1 marrow signal with diffuse low-level bone marrow increased STIR signal and enhancement. No focal bone lesion. Schmorl's node involving the right aspect of the L2 superior endplate. Conus medullaris and cauda equina: Conus extends to the L1 level. Conus and cauda equina appear normal. No abnormal intrathecal enhancement. Paraspinal and other soft tissues: Infrarenal abdominal aortic aneurysm measuring 3.9 cm. Disc levels: T12-L1:  Negative. L1-L2:  Minimal disc bulging.  No stenosis. L2-L3:  Negative disc.  Mild left facet arthropathy.  No stenosis. L3-L4: Mild disc bulging and bilateral facet arthropathy. Prominent posterior epidural fat. Mild spinal canal  stenosis. No neuroforaminal stenosis. L4-L5: Mild disc bulging with bulky right far lateral disc osteophyte complex. Mild bilateral facet arthropathy. Prominent posterior epidural fat. Mild spinal canal stenosis. Moderate right lateral recess stenosis. Mild right neuroforaminal stenosis. No left neuroforaminal stenosis. L5-S1: Disc bulging and endplate spurring asymmetric to the right. Prominent right foraminal disc protrusion. Mild bilateral facet arthropathy. Moderate bilateral neuroforaminal stenosis. No spinal canal stenosis. IMPRESSION: 1. Multilevel lumbar spondylosis as described above. Moderate bilateral neuroforaminal stenosis at L5-S1. 2. Mild spinal canal stenosis and moderate right lateral recess stenosis at L4-L5. 3. Diffuse low-level bone marrow increased STIR signal and enhancement, nonspecific but can be seen in  the setting of anemia, smoking, obesity, or other hematologic disorders. 4. 3.9 cm infrarenal abdominal aortic aneurysm. Recommend follow-up ultrasound in 3 years. Electronically Signed   By: Obie Dredge M.D.   On: 04/05/2023 19:19   DG Chest Port 1 View Result Date: 04/03/2023 CLINICAL DATA:  Four day history of altered mental status and weakness EXAM: PORTABLE CHEST 1 VIEW COMPARISON:  Chest radiograph dated 06/21/2021 FINDINGS: Normal lung volumes. Increased bilateral interstitial and diffuse patchy opacities. Blunting of the costophrenic angles. No pneumothorax. Similar enlarged cardiomediastinal silhouette. No acute osseous abnormality. IMPRESSION: 1. Increased bilateral interstitial and diffuse patchy opacities, likely pulmonary edema. 2. Blunting of the costophrenic angles, which may represent small pleural effusions. 3. Similar cardiomegaly. Electronically Signed   By: Agustin Cree M.D.   On: 04/03/2023 12:43    Labs:  CBC: Recent Labs    04/04/23 0244 04/05/23 0735 04/07/23 1251 04/08/23 0830  WBC 7.1 7.9 9.8 8.9  HGB 11.2* 11.8* 13.1 12.2*  HCT 36.5* 37.7* 43.4  39.2  PLT 318 346 363 289    COAGS: Recent Labs    04/03/23 1118  INR 1.9*  APTT 32    BMP: Recent Labs    04/07/23 0508 04/08/23 0830 04/09/23 0555 04/10/23 0536  NA 145 146* 146* 145  K 3.2* 2.7* 3.1* 3.1*  CL 107 108 111 114*  CO2 23 26 25 24   GLUCOSE 120* 123* 139* 196*  BUN 43* 58* 60* 51*  CALCIUM 12.2* 11.0*  IMSPUN 9.7 9.2  CREATININE 2.37* 2.91* 2.40* 1.96*  GFRNONAA 30* 23* 30* 38*    LIVER FUNCTION TESTS: Recent Labs    04/03/23 1118 04/06/23 0948 04/07/23 0508 04/08/23 0830 04/09/23 0555 04/10/23 0536  BILITOT 0.8 0.8 1.1 1.0  --   --   AST 18 27 26 22   --   --   ALT 5 5 9 7   --   --   ALKPHOS 70 70 72 77  --   --   PROT 10.7* 11.1* 11.2* 10.8*  --   --   ALBUMIN 2.1* 2.0* 2.2* 1.8* 1.7* 1.6*    TUMOR MARKERS: No results for input(s): "AFPTM", "CEA", "CA199", "CHROMGRNA" in the last 8760 hours.  Assessment and Plan: 63 y.o. male with PMH sig for CHF, COPD, obesity, HTN, HLD, GERD, OSA, paroxysmal afib on Xarelto, thoracic aortic aneurysm, alcohol/tobacco abuse, DM who  was admitted to [facility] on 04/03/23 with complaints of productive cough, fever/chills, confusion, back pain, nausea, progressive weakness/fatigue and shortness of breath over the last 2 weeks. CT C/A/P revealed:  1.  Small bilateral pleural effusions.   2.  Small pericardial effusion.   3. Infrarenal abdominal aortic aneurysm measuring up to 4.2 x 3.5 cm in caliber. Recommend follow-up CT or MR as appropriate in 12 months and referral to or continued care with vascular specialist. (Ref.: J Vasc Surg. 2018; 67:2-77 and J Am Coll Radiol 2013;10(10):789-794.)   4.  Coronary artery disease.  Latest CT chest done on 3/23 revealed:   1. Similar left lower lobe collapse/consolidation, possibly due to pneumonia. 2. Scattered lytic lesions, worrisome for multiple myeloma. 3. Small pericardial effusion, similar. 4. 4.0 cm ascending aortic aneurysm. Recommend annual imaging followup  by CTA or MRA. This recommendation follows 2010 ACCF/AHA/AATS/ACR/ASA/SCA/SCAI/SIR/STS/SVM Guidelines for the Diagnosis and Management of Patients with Thoracic Aortic Disease. Circulation. 2010; 121: Z610-R604. Aortic aneurysm NOS (ICD10-I71.9). 5. Aortic atherosclerosis (ICD10-I70.0). Coronary artery calcification. 6. Enlarged pulmonic trunk, indicative of pulmonary arterial hypertension.  Pt also  noted to be hypercalcemic with renal insufficiency, elevated total protein; oncology has recommended image guided bone marrow biopsy to rule out myeloma/Waldenstrom's.Risks and benefits of procedure was discussed with the pt's spouse including, but not limited to bleeding, infection, damage to adjacent structures or low yield requiring additional tests.  All of the questions were answered and there is agreement to proceed.  Consent signed and in chart.  Procedure scheduled for this am  Thank you for allowing our service to participate in Ronnie Dunn 's care.  Electronically Signed: D. Jeananne Rama, PA-C   04/10/2023, 9:14 PM      I spent a total of 20 Minutes    in face to face in clinical consultation, greater than 50% of which was counseling/coordinating care for image guided bone marrow biopsy

## 2023-04-10 NOTE — Progress Notes (Signed)
 PT Cancellation Note  Patient Details Name: Ronnie Dunn MRN: [id number] DOB: 04-09-1960   Cancelled Treatment:    Reason Eval/Treat Not Completed: Patient not medically ready  Noted in OT note Pt sedated and not following commands.  Did speak with RN who reports pt with good ROM in extremities.  Will hold PT today.  Anise Salvo, PT Acute [facility] [phone]  Rayetta Humphrey 04/10/2023, 10:28 AM

## 2023-04-10 NOTE — Progress Notes (Signed)
 Patient ID: Ronnie Dunn, male   DOB: 03/20/1960, 63 y.o.   MRN: [id number] S: somnolent.  Wife reports he was agitated earlier and needed some sedation. O:BP (!) 162/92   Pulse 68   Temp 98.3 F (36.8 C) (Axillary)   Resp 17   Ht 6\' 2"  (1.88 m)   Wt 108.2 kg   SpO2 93%   BMI 30.63 kg/m   Intake/Output Summary (Last 24 hours) at 04/10/2023 1306 Last data filed at 04/10/2023 1252 Gross per 24 hour  Intake 4680.26 ml  Output 2475 ml  Net 2205.26 ml   Intake/Output: I/O last 3 completed shifts: In: 6419.9 [I.V.:4399.3; NG/GT:542.7; IV Piggyback:1478] Out: 1725 [Urine:1725]  Intake/Output this shift:  Total I/O In: 1025.8 [I.V.:659.1; Other:20; NG/GT:346.7] Out: 1925 [Urine:1275; Stool:650] Weight change: 2 kg Gen: ill-appearing but in NAD, slowed mentation CVS: RRR Resp:CTA Abd: +BS, soft, NT/ND Ext: no edema  Recent Labs  Lab 04/05/23 0735 04/06/23 0515 04/06/23 0948 04/07/23 0508 04/08/23 0830 04/09/23 0555 04/09/23 1332 04/09/23 1619 04/10/23 0536 04/10/23 0537  NA 136 137 142 145 146* 146*  --   --  145  --   K 2.8* 2.6* 2.8* 3.2* 2.7* 3.1*  --   --  3.1*  --   CL 96* 96* 100 107 108 111  --   --  114*  --   CO2 28 29 25 23 26 25   --   --  24  --   GLUCOSE 108* 128* 120* 120* 123* 139*  --   --  196*  --   BUN 30* 32* 33* 43* 58* 60*  --   --  51*  --   CREATININE 1.97* 1.99* 2.10* 2.37* 2.91* 2.40*  --   --  1.96*  --   ALBUMIN  --   --  2.0* 2.2* 1.8* 1.7*  --   --  1.6*  --   CALCIUM 11.8* 11.8* 12.3* 12.2* 11.0* 9.7  --   --  9.2  --   PHOS 2.0*  --  3.9  --  2.5 3.1 2.8 2.5 2.0* 1.9*  AST  --   --  27 26 22   --   --   --   --   --   ALT  --   --  5 9 7   --   --   --   --   --    Liver Function Tests: Recent Labs  Lab 04/06/23 0948 04/07/23 0508 04/08/23 0830 04/09/23 0555 04/10/23 0536  AST 27 26 22   --   --   ALT 5 9 7   --   --   ALKPHOS 70 72 77  --   --   BILITOT 0.8 1.1 1.0  --   --   PROT 11.1* 11.2* 10.8*  --   --   ALBUMIN 2.0* 2.2*  1.8* 1.7* 1.6*   No results for input(s): "LIPASE", "AMYLASE" in the last 168 hours. Recent Labs  Lab 04/07/23 0601 04/08/23 1852 04/09/23 0555  AMMONIA 67* 61* 63*   CBC: Recent Labs  Lab 04/04/23 0244 04/05/23 0735 04/07/23 1251 04/08/23 0830  WBC 7.1 7.9 9.8 8.9  HGB 11.2* 11.8* 13.1 12.2*  HCT 36.5* 37.7* 43.4 39.2  MCV 100.0 98.4 100.7* 99.2  PLT 318 346 363 289   Cardiac Enzymes: No results for input(s): "CKTOTAL", "CKMB", "CKMBINDEX", "TROPONINI" in the last 168 hours. CBG: Recent Labs  Lab 04/09/23 1650 04/10/23 0008 04/10/23 0403 04/10/23 0816 04/10/23  1134  GLUCAP 144* 175* 172* 179* 162*    Iron Studies: No results for input(s): "IRON", "TIBC", "TRANSFERRIN", "FERRITIN" in the last 72 hours. Studies/Results: VAS [place] LOWER EXTREMITY VENOUS (DVT) Result Date: 04/10/2023  Lower Venous DVT Study Patient Name:  Ronnie Dunn  Date of Exam:   04/10/2023 Medical Rec #: [id number]      Accession #:    [id number] Date of Birth: [birth year]      Patient Gender: M Patient Age:   89 years Exam Location:  [facility] Procedure:      VAS [place] LOWER EXTREMITY VENOUS (DVT) Referring Phys: MURALI RAMASWAMY --------------------------------------------------------------------------------  Indications: Edema.  Performing Technologist: Chanda Busing RVT  Examination Guidelines: A complete evaluation includes B-mode imaging, spectral Doppler, color Doppler, and power Doppler as needed of all accessible portions of each vessel. Bilateral testing is considered an integral part of a complete examination. Limited examinations for reoccurring indications may be performed as noted. The reflux portion of the exam is performed with the patient in reverse Trendelenburg.  +---------+---------------+---------+-----------+----------+--------------+ RIGHT    CompressibilityPhasicitySpontaneityPropertiesThrombus Aging  +---------+---------------+---------+-----------+----------+--------------+ CFV      Full           Yes      Yes                                 +---------+---------------+---------+-----------+----------+--------------+ SFJ      Full                                                        +---------+---------------+---------+-----------+----------+--------------+ FV Prox  Full                                                        +---------+---------------+---------+-----------+----------+--------------+ FV Mid   Full                                                        +---------+---------------+---------+-----------+----------+--------------+ FV DistalFull                                                        +---------+---------------+---------+-----------+----------+--------------+ PFV      Full                                                        +---------+---------------+---------+-----------+----------+--------------+ POP      Full           Yes      Yes                                 +---------+---------------+---------+-----------+----------+--------------+  PTV      Full                                                        +---------+---------------+---------+-----------+----------+--------------+ PERO     Full                                                        +---------+---------------+---------+-----------+----------+--------------+   +---------+---------------+---------+-----------+----------+--------------+ LEFT     CompressibilityPhasicitySpontaneityPropertiesThrombus Aging +---------+---------------+---------+-----------+----------+--------------+ CFV      Full           Yes      Yes                                 +---------+---------------+---------+-----------+----------+--------------+ SFJ      Full                                                         +---------+---------------+---------+-----------+----------+--------------+ FV Prox  Full                                                        +---------+---------------+---------+-----------+----------+--------------+ FV Mid   Full                                                        +---------+---------------+---------+-----------+----------+--------------+ FV DistalFull                                                        +---------+---------------+---------+-----------+----------+--------------+ PFV      Full                                                        +---------+---------------+---------+-----------+----------+--------------+ POP      Full           Yes      Yes                                 +---------+---------------+---------+-----------+----------+--------------+ PTV      Full                                                        +---------+---------------+---------+-----------+----------+--------------+  PERO     Full                                                        +---------+---------------+---------+-----------+----------+--------------+    Summary: RIGHT: - There is no evidence of deep vein thrombosis in the lower extremity.  - No cystic structure found in the popliteal fossa.  LEFT: - There is no evidence of deep vein thrombosis in the lower extremity.  - No cystic structure found in the popliteal fossa.  *See table(s) above for measurements and observations.    Preliminary    CT CHEST WO CONTRAST Result Date: 04/09/2023 CLINICAL DATA:  Respiratory failure. EXAM: CT CHEST WITHOUT CONTRAST TECHNIQUE: Multidetector CT imaging of the chest was performed following the standard protocol without IV contrast. RADIATION DOSE REDUCTION: This exam was performed according to the departmental dose-optimization program which includes automated exposure control, adjustment of the mA and/or kV according to patient size and/or use of  iterative reconstruction technique. COMPARISON:  04/07/2023. FINDINGS: Cardiovascular: Atherosclerotic calcification of the aorta, aortic valve and coronary arteries. Ascending aorta measures 4.0 cm (coronal image 66), stable. Enlarged pulmonic trunk and heart. Small pericardial effusion, similar. Mediastinum/Nodes: Thoracic inlet lymph nodes are not enlarged by CT size criteria. Mediastinal lymph nodes are not enlarged by CT size criteria. Hilar regions are difficult to definitively evaluate without IV contrast. No axillary adenopathy. Feeding tube in the esophagus. Lungs/Pleura: Image quality is degraded by expiratory phase imaging and respiratory motion. Centrilobular and paraseptal emphysema. Dependent atelectasis in the right lower lobe. Collapse/consolidation in the left lower lobe, as on 04/07/2023. No definite pleural fluid. Airway is unremarkable. Upper Abdomen: Feeding tube is followed into the gastric antrum but is not imaged distally. Visualized portions of the liver, adrenal glands, left kidney, spleen, pancreas, stomach and bowel are otherwise grossly unremarkable. No upper abdominal adenopathy. Musculoskeletal: Degenerative changes in the spine. Segmental lytic lesion and old fracture involving the left third posterior rib. Lucency in the C7 vertebral body (8/3). There may be a lytic lesion in the manubrium. Additional lytic lesion in the posterior left eighth rib. IMPRESSION: 1. Similar left lower lobe collapse/consolidation, possibly due to pneumonia. 2. Scattered lytic lesions, worrisome for multiple myeloma. 3. Small pericardial effusion, similar. 4. 4.0 cm ascending aortic aneurysm. Recommend annual imaging followup by CTA or MRA. This recommendation follows 2010 ACCF/AHA/AATS/ACR/ASA/SCA/SCAI/SIR/STS/SVM Guidelines for the Diagnosis and Management of Patients with Thoracic Aortic Disease. Circulation. 2010; 121: Q469-G295. Aortic aneurysm NOS (ICD10-I71.9). 5. Aortic atherosclerosis  (ICD10-I70.0). Coronary artery calcification. 6. Enlarged pulmonic trunk, indicative of pulmonary arterial hypertension. Electronically Signed   By: Leanna Battles M.D.   On: 04/09/2023 15:30   Rapid EEG Result Date: 04/09/2023 Charlsie Quest, MD     04/09/2023  8:41 AM Patient Name: Ronnie Dunn MRN: [id number] Epilepsy Attending: Charlsie Quest Referring Physician/Provider: Kalman Shan, MD Date: 04/08/2023 Duration: 2 Hours 48 mins Patient history: 63yo M with ams. EEG to evaluate for seizure Level of alertness: comatose/ lethargic AEDs during EEG study: None Technical aspects: This EEG was obtained using a 10 lead EEG system positioned circumferentially without any parasagittal coverage (rapid EEG). Computer selected EEG is reviewed as  well as background features and all clinically significant events. Description: EEG showed continuous generalized 3 to 6 Hz theta-delta slowing admixed with 12-14hz  beta activity.  Hyperventilation and photic stimulation were not performed.   ABNORMALITY - Continuous slow, generalized IMPRESSION: This limited ceribell EEG is suggestive of moderate to severe diffuse encephalopathy. No seizures or epileptiform discharges were seen throughout the recording. Charlsie Quest   DG CHEST PORT 1 VIEW Result Date: 04/08/2023 CLINICAL DATA:  Follow-up pneumothorax EXAM: PORTABLE CHEST 1 VIEW COMPARISON:  04/07/2023 FINDINGS: Cardiac shadow is enlarged but stable. Feeding catheter is noted extending towards the stomach. The lungs are well aerated bilaterally. No focal pneumothorax or effusion is seen. Mild central vascular congestion is noted. No bony abnormality is seen. IMPRESSION: Mild vascular congestion without significant edema. No evidence of pneumothorax. Electronically Signed   By: Alcide Clever M.D.   On: 04/08/2023 22:33   ECHOCARDIOGRAM COMPLETE Result Date: 04/08/2023    ECHOCARDIOGRAM REPORT   Patient Name:   Ronnie Dunn Date of Exam: 04/08/2023 Medical Rec  #:  [id number]     Height:       74.0 in Accession #:    [id number]    Weight:       233.9 lb Date of Birth:  [birth year]     BSA:          2.323 m Patient Age:    62 years      BP:           183/116 mmHg Patient Gender: M             HR:           100 bpm. Exam Location:  Inpatient Procedure: 2D Echo, Cardiac Doppler and Color Doppler (Both Spectral and Color            Flow Doppler were utilized during procedure). Indications:    Acute Respiratory Distress  History:        Patient has prior history of Echocardiogram examinations, most                 recent 07/22/2022. Risk Factors:Hypertension, Diabetes, Current                 Smoker and Sleep Apnea.  Sonographer:    Karma Ganja Referring Phys: 99 MURALI RAMASWAMY  Sonographer Comments: Technically difficult study due to poor echo windows. IMPRESSIONS  1. Left ventricular ejection fraction, by estimation, is 55 to 60%. The left ventricle has normal function. The left ventricle has no regional wall motion abnormalities. There is mild left ventricular hypertrophy. Left ventricular diastolic parameters are indeterminate.  2. Right ventricular systolic function is moderately reduced. The right ventricular size is mildly enlarged. Mildly increased right ventricular wall thickness. There is normal pulmonary artery systolic pressure. The estimated right ventricular systolic pressure is 33.2 mmHg.  3. Left atrial size was mildly dilated.  4. Right atrial size was severely dilated.  5. A small pericardial effusion is present.  6. The mitral valve is normal in structure. No evidence of mitral valve regurgitation. No evidence of mitral stenosis.  7. The aortic valve was not well visualized. Aortic valve regurgitation is not visualized. No aortic stenosis is present.  8. The inferior vena cava is dilated in size with >50% respiratory variability, suggesting right atrial pressure of 8 mmHg. Comparison(s): A prior study was performed on 06/22/2021. RV is dilated and reduced  function compared to prior study. FINDINGS  Left Ventricle: Left ventricular ejection fraction, by estimation, is 55 to 60%. The left ventricle has normal function. The left ventricle has no regional wall motion abnormalities. The left ventricular internal  cavity size was normal in size. There is  mild left ventricular hypertrophy. Left ventricular diastolic parameters are indeterminate. Right Ventricle: The right ventricular size is mildly enlarged. Mildly increased right ventricular wall thickness. Right ventricular systolic function is moderately reduced. There is normal pulmonary artery systolic pressure. The tricuspid regurgitant velocity is 2.51 m/s, and with an assumed right atrial pressure of 8 mmHg, the estimated right ventricular systolic pressure is 33.2 mmHg. Left Atrium: Left atrial size was mildly dilated. Right Atrium: Right atrial size was severely dilated. Pericardium: A small pericardial effusion is present. Mitral Valve: The mitral valve is normal in structure. No evidence of mitral valve regurgitation. No evidence of mitral valve stenosis. Tricuspid Valve: The tricuspid valve is normal in structure. Tricuspid valve regurgitation is mild . No evidence of tricuspid stenosis. Aortic Valve: The aortic valve was not well visualized. Aortic valve regurgitation is not visualized. No aortic stenosis is present. Aortic valve mean gradient measures 3.0 mmHg. Aortic valve peak gradient measures 5.4 mmHg. Aortic valve area, by VTI measures 3.07 cm. Pulmonic Valve: The pulmonic valve was normal in structure. Pulmonic valve regurgitation is not visualized. No evidence of pulmonic stenosis. Aorta: The aortic root is normal in size and structure. Venous: The inferior vena cava is dilated in size with greater than 50% respiratory variability, suggesting right atrial pressure of 8 mmHg. IAS/Shunts: The interatrial septum was not well visualized.  LEFT VENTRICLE PLAX 2D LVIDd:         5.20 cm   Diastology LVIDs:          3.90 cm   LV e' medial:    8.45 cm/s LV PW:         1.20 cm   LV E/e' medial:  10.5 LV IVS:        1.20 cm   LV e' lateral:   6.82 cm/s LVOT diam:     2.20 cm   LV E/e' lateral: 13.0 LV SV:         51 LV SV Index:   22 LVOT Area:     3.80 cm  RIGHT VENTRICLE            IVC RV Basal diam:  4.50 cm    IVC diam: 2.40 cm RV S prime:     9.28 cm/s TAPSE (M-mode): 0.8 cm LEFT ATRIUM              Index        RIGHT ATRIUM           Index LA diam:        4.70 cm  2.02 cm/m   RA Area:     33.90 cm LA Vol (A2C):   111.0 ml 47.78 ml/m  RA Volume:   139.00 ml 59.83 ml/m LA Vol (A4C):   59.2 ml  25.48 ml/m LA Biplane Vol: 87.3 ml  37.58 ml/m  AORTIC VALVE AV Area (Vmax):    2.93 cm AV Area (Vmean):   2.71 cm AV Area (VTI):     3.07 cm AV Vmax:           116.33 cm/s AV Vmean:          77.367 cm/s AV VTI:            0.166 m AV Peak Grad:      5.4 mmHg AV Mean Grad:      3.0 mmHg LVOT Vmax:         89.62 cm/s LVOT Vmean:  55.080 cm/s LVOT VTI:          0.134 m LVOT/AV VTI ratio: 0.81  AORTA Ao Root diam: 3.90 cm MITRAL VALVE               TRICUSPID VALVE MV Area (PHT): 3.78 cm    TR Peak grad:   25.2 mmHg MV Decel Time: 201 msec    TR Vmax:        251.00 cm/s MV E velocity: 88.43 cm/s                            SHUNTS                            Systemic VTI:  0.13 m                            Systemic Diam: 2.20 cm Sunit Tolia Electronically signed by Tessa Lerner Signature Date/Time: 04/08/2023/2:44:11 PM    Final     arformoterol  15 mcg Nebulization BID   budesonide (PULMICORT) nebulizer solution  0.5 mg Nebulization BID   Chlorhexidine Gluconate Cloth  6 each Topical Daily   famotidine  40 mg Per Tube Daily   feeding supplement (PROSource TF20)  60 mL Per Tube BID   folic acid  1 mg Per Tube Daily   hydrALAZINE  25 mg Per Tube TID   lactulose  30 g Per Tube BID   multivitamin  15 mL Per Tube Daily   nicotine  21 mg Transdermal Daily   mouth rinse  15 mL Mouth Rinse 4 times per day    pantoprazole (PROTONIX) IV  40 mg Intravenous Q24H   revefenacin  175 mcg Nebulization Daily   rivaroxaban  20 mg Per Tube Q supper   thiamine  100 mg Oral Daily   Or   thiamine  100 mg Intravenous Daily    BMET    Component Value Date/Time   NA 145 04/10/2023 0536   NA 128 (L) 06/28/2022 0917   K 3.1 (L) 04/10/2023 0536   CL 114 (H) 04/10/2023 0536   CO2 24 04/10/2023 0536   GLUCOSE 196 (H) 04/10/2023 0536   BUN 51 (H) 04/10/2023 0536   BUN 38 (H) 06/28/2022 0917   CREATININE 1.96 (H) 04/10/2023 0536   CALCIUM 9.2 04/10/2023 0536   GFRNONAA 38 (L) 04/10/2023 0536   GFRAA  12/17/2006 0500    >60        The eGFR has been calculated using the MDRD equation. This calculation has not been validated in all clinical   CBC    Component Value Date/Time   WBC 8.9 04/08/2023 0830   RBC 3.95 (L) 04/08/2023 0830   HGB 12.2 (L) 04/08/2023 0830   HGB 15.4 08/18/2021 0855   HCT 39.2 04/08/2023 0830   HCT 45.8 08/18/2021 0855   PLT 289 04/08/2023 0830   PLT 512 (H) 08/18/2021 0855   MCV 99.2 04/08/2023 0830   MCV 98 (H) 08/18/2021 0855   MCH 30.9 04/08/2023 0830   MCHC 31.1 04/08/2023 0830   RDW 18.2 (H) 04/08/2023 0830   RDW 13.8 08/18/2021 0855   LYMPHSABS 1.0 04/03/2023 1118   LYMPHSABS 1.2 07/07/2021 0920   MONOABS 1.0 04/03/2023 1118   EOSABS 0.0 04/03/2023 1118   EOSABS 0.1 07/07/2021 0920   BASOSABS 0.0 04/03/2023 1118  BASOSABS 0.1 07/07/2021 0920     Assessment/ Plan: AKI - b/l creat 1.1- 1.3 from mid 2024. Pt was diuresed w/ IV lasix 40-80mg  for about 3-4 days, net neg 2 L overall. BP's were high then normal, then dropped ;yesterday prob due to sedation. Pt was hypercalcemic on presentation as well. UA unremarkable, renal US no obstruction. Suspect initial AKI due to hypercalcemia and/or underlying myeloma, 2nd rise may be from diuresis. Pt still looks dry. Creat peaked at 2.91 on 04/08/23 and now down to 1.96 w/ gentle IVF"s, will cont at 65/hr. F/u labs in am.   Hypernatremia - sodium stable at 145 today, continue D5W at 85 cc/hr (in addition to LR as above, needs both). F/u Na in am.  Hypercalcemia - getting calcitonin, and sp Zometa, per oncology. 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 Ca down to 9.2 from peak of 12.3.  Concern for myeloma or Waldenstrom's.   Oncology following.  Will likely need bone marrow biopsy.  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

## 2023-04-10 NOTE — Progress Notes (Signed)
 Bilateral lower extremity venous duplex has been completed. Preliminary results can be found in CV Proc through chart review.   04/10/23 11:35 AM Olen Cordial RVT

## 2023-04-10 NOTE — Progress Notes (Signed)
 OT Cancellation Note  Patient Details Name: Ronnie Dunn MRN: [id number] DOB: 10/04/1960   Cancelled Treatment:    Reason Eval/Treat Not Completed: Patient not medically ready: Per RN, pt remains sedated and following no commands. RN agreeable to OT hold for today and will monitor for time when pt can participate in OT Evaluation.   Theodoro Clock 04/10/2023, 8:55 AM

## 2023-04-10 NOTE — Plan of Care (Signed)
  Problem: Clinical Measurements: Goal: Respiratory complications will improve Outcome: Progressing   Problem: Nutrition: Goal: Adequate nutrition will be maintained Outcome: Progressing   Problem: Coping: Goal: Level of anxiety will decrease Outcome: Progressing   

## 2023-04-11 ENCOUNTER — Inpatient Hospital Stay (HOSPITAL_COMMUNITY)
Admit: 2023-04-11 | Discharge: 2023-04-11 | Disposition: A | Discharge status: Home / Self Care | Attending: Internal Medicine | Admitting: Internal Medicine

## 2023-04-11 ENCOUNTER — Inpatient Hospital Stay (HOSPITAL_COMMUNITY)

## 2023-04-11 DIAGNOSIS — R569 Unspecified convulsions: Secondary | ICD-10-CM | POA: Diagnosis not present

## 2023-04-11 DIAGNOSIS — J9601 Acute respiratory failure with hypoxia: Secondary | ICD-10-CM

## 2023-04-11 DIAGNOSIS — C9000 Multiple myeloma not having achieved remission: Secondary | ICD-10-CM | POA: Diagnosis not present

## 2023-04-11 DIAGNOSIS — N179 Acute kidney failure, unspecified: Secondary | ICD-10-CM | POA: Diagnosis not present

## 2023-04-11 DIAGNOSIS — G9341 Metabolic encephalopathy: Secondary | ICD-10-CM | POA: Diagnosis not present

## 2023-04-11 LAB — CBC WITH DIFFERENTIAL/PLATELET
Abs Immature Granulocytes: 0.12 10*3/uL — ABNORMAL HIGH (ref 0.00–0.07)
Basophils Absolute: 0.1 10*3/uL (ref 0.0–0.1)
Basophils Relative: 1 %
Eosinophils Absolute: 0.2 10*3/uL (ref 0.0–0.5)
Eosinophils Relative: 3 %
HCT: 33.3 % — ABNORMAL LOW (ref 39.0–52.0)
Hemoglobin: 9.8 g/dL — ABNORMAL LOW (ref 13.0–17.0)
Immature Granulocytes: 2 %
Lymphocytes Relative: 18 %
Lymphs Abs: 1 10*3/uL (ref 0.7–4.0)
MCH: 30.8 pg (ref 26.0–34.0)
MCHC: 29.4 g/dL — ABNORMAL LOW (ref 30.0–36.0)
MCV: 104.7 fL — ABNORMAL HIGH (ref 80.0–100.0)
Monocytes Absolute: 0.4 10*3/uL (ref 0.1–1.0)
Monocytes Relative: 8 %
Neutro Abs: 3.7 10*3/uL (ref 1.7–7.7)
Neutrophils Relative %: 68 %
Platelets: 258 10*3/uL (ref 150–400)
RBC: 3.18 MIL/uL — ABNORMAL LOW (ref 4.22–5.81)
RDW: 18 % — ABNORMAL HIGH (ref 11.5–15.5)
WBC: 5.4 10*3/uL (ref 4.0–10.5)
nRBC: 0.7 % — ABNORMAL HIGH (ref 0.0–0.2)

## 2023-04-11 LAB — RENAL FUNCTION PANEL
Albumin: 1.8 g/dL — ABNORMAL LOW (ref 3.5–5.0)
Anion gap: 4 — ABNORMAL LOW (ref 5–15)
BUN: 41 mg/dL — ABNORMAL HIGH (ref 8–23)
CO2: 22 mmol/L (ref 22–32)
Calcium: 8.3 mg/dL — ABNORMAL LOW (ref 8.9–10.3)
Chloride: 116 mmol/L — ABNORMAL HIGH (ref 98–111)
Creatinine, Ser: 1.78 mg/dL — ABNORMAL HIGH (ref 0.61–1.24)
GFR, Estimated: 42 mL/min — ABNORMAL LOW (ref >60)
Glucose, Bld: 159 mg/dL — ABNORMAL HIGH (ref 70–99)
Phosphorus: 2.7 mg/dL (ref 2.5–4.6)
Potassium: 3.3 mmol/L — ABNORMAL LOW (ref 3.5–5.1)
Sodium: 142 mmol/L (ref 135–145)

## 2023-04-11 LAB — AMMONIA: Ammonia: 66 umol/L — ABNORMAL HIGH (ref 9–35)

## 2023-04-11 LAB — MAGNESIUM: Magnesium: 1.8 mg/dL (ref 1.7–2.4)

## 2023-04-11 LAB — GLUCOSE, CAPILLARY
Glucose-Capillary: 127 mg/dL — ABNORMAL HIGH (ref 70–99)
Glucose-Capillary: 127 mg/dL — ABNORMAL HIGH (ref 70–99)
Glucose-Capillary: 144 mg/dL — ABNORMAL HIGH (ref 70–99)
Glucose-Capillary: 169 mg/dL — ABNORMAL HIGH (ref 70–99)
Glucose-Capillary: 181 mg/dL — ABNORMAL HIGH (ref 70–99)
Glucose-Capillary: 181 mg/dL — ABNORMAL HIGH (ref 70–99)

## 2023-04-11 LAB — KAPPA/LAMBDA LIGHT CHAINS
Kappa free light chain: 460.6 mg/L — ABNORMAL HIGH (ref 3.3–19.4)
Kappa, lambda light chain ratio: 14 — ABNORMAL HIGH (ref 0.26–1.65)
Lambda free light chains: 32.9 mg/L — ABNORMAL HIGH (ref 5.7–26.3)

## 2023-04-11 MED ORDER — MORPHINE SULFATE (PF) 2 MG/ML IV SOLN
1.0000 mg | Freq: Once | INTRAVENOUS | Status: AC | PRN
  Administered 2023-04-11: 1 mg via INTRAVENOUS
  Filled 2023-04-11: qty 1

## 2023-04-11 MED ORDER — POTASSIUM CHLORIDE 10 MEQ/100ML IV SOLN
10.0000 meq | INTRAVENOUS | Status: AC
  Administered 2023-04-11 (×4): 10 meq via INTRAVENOUS
  Filled 2023-04-11 (×4): qty 100

## 2023-04-11 MED ORDER — FENTANYL CITRATE (PF) 100 MCG/2ML IJ SOLN
INTRAMUSCULAR | Status: AC
  Filled 2023-04-11: qty 2

## 2023-04-11 MED ORDER — MIDAZOLAM HCL 2 MG/2ML IJ SOLN
INTRAMUSCULAR | Status: AC
  Filled 2023-04-11: qty 2

## 2023-04-11 NOTE — Progress Notes (Addendum)
 PROGRESS NOTE    Ronnie Dunn  GNF:[id number] DOB: 01/07/1961 DOA: 04/03/2023 PCP: [name], MD   Brief Narrative: 63 yo male  with past medical history significant for COPD, chronic diastolic congestive heart failure, HTN, HLD, paroxysmal atrial fibrillation on anticoagulation with Xarelto, GERD, OSA, tobacco use disorder, alcohol use disorder, obesity who presented to [facility] ED from home via EMS on 04/03/2022 with complaints of productive cough, fever/chills, nausea, progressive weakness/fatigue and shortness of breath over the last 2 weeks.  Spouse also noted that he was confused, not wanting to eat or drink anything.  Also reported to be hypothermic with temperature 94.0 F.  Also has had complaints of low back pain for past two weeks which is new,no falls reported  Recently completed a course of azithromycin prescribed by his pulmonologist, [name] which did not improve his symptoms.  Given progression, confusion spouse called EMS and patient was brought to the ED for further evaluation management.  CT chest abdomen and pelvis shows small bilateral pleural effusion, small pericardial effusion, infrarenal abdominal aortic aneurysm 4.2 x 3.5 cm recommending follow-up CT or MRI in 12 months, CAD.  Assessment & Plan:   Principal Problem:   Encephalopathy Active Problems:   Pneumonia due to infectious organism   AKI (acute kidney injury) (HCC)   Acute metabolic encephalopathy   Hypokalemia  FUO -patient was treated with vancomycin cefepime and Rocephin etc. initially.  ID was consulted.  COVID RSV and influenza were negative  He has not had any fever spikes in the last 72 hours. White count has normalized lactic acidosis has resolved.  Patient was transferred to stepdown on 3/21 due to respiratory distress  PCCM started zosyn and ID was consulted  CT chest abdomen and pelvis did not reveal any evidence of acute infection. Antibiotics were stopped 3/22  Rule out  multiple myeloma/Waldenstrm's macroglobulinemia -patient presented with hypercalcemia, renal insufficiency, anemia and back pain.  Appreciate oncology input.  SPEP UPEP pending  Kappa lambda chains ordered Apparently lab cannot process some of these labs  His CT of the chest from 04/09/2023 shows third lytic lesions worrisome for multiple myeloma Dr. Myna Hidalgo following. Status post bone marrow biopsy on 04/11/2023 by interventional radiology  Paroxysmal A-fib/A-fib RVR has history of atrial fibrillation on Xarelto, beta-blockers were stopped as patient became bradycardic after starting Precedex.    Hypokalemia -3.1 continue to replete  Hypernatremia  resolved fluids changed to hypotonic fluids continue slow rate stable.  COPD Not oxygen dependent at baseline.  Follows with pulmonology outpatient [name]. On Trelegy Ellipta at baseline   Low back pain for the last 2 weeks per wife MRI lumbar spine -lumbar spondylosis, moderate bilateral neuroforaminal stenosis at L5-S1, mild spinal canal stenosis, moderate right lateral recess stenosis at L4-L5  Metabolic encephalopathy likely multifactorial -hypoxic respiratory failure/hyperammonia/EtOH /hypercalcemia/?  Seizures- wife reports he is more confused which is not his baseline he is hallucinating UA on admission was negative for UTI CT head remote left cerebellar infarct TSH normal  Ammonia 63 from 67 up from 55 core track placed in stepdown for lactulose Continue lactulose Cerebel eeg moderate encephalopathy no seizure activity is noted  For EEG 3/25 MRI brain could not be done as patient became hypoxic with supine  Chronic diastolic congestive heart failure, decompensated Essential hypertension Home regimen includes metoprolol succinate 50 mg p.o. daily, hydralazine 50 mg p.o. 3 times daily, spironolactone 25 mg p.o. daily, lisinopril 5 mg p.o. daily, furosemide 120 mg p.o. daily.  All this meds have been on hold except  hydralazine  GERD -- Protonix . daily   HLD -- Atorvastatin. daily   Tobacco use disorder EtOH use disorder Counsel need for complete cessation/abstinence. --Nicotine patch   Obesity, class I Body mass index is 31.53 kg/m.     Moderate hypercalcemia -resolved.  Status post Zometa  Calcitonin unclear etiology ?malignancy?myeloma-phosphorus 2.8, vitamin D 73.8 Await PTH SPEP UPEP CT chest suspicious for multiple myeloma with scattered lytic lesions small pericardial effusion similar left lobe collapse consolidation, pulmonary artery hypertension.  AKI creatinine improvement with slow IV fluids.  ultrasound of the kidneys showed no hydronephrosis.  Appreciate nephrology, gentle IV fluids.  DVT prophylaxis:  rivaroxaban (XARELTO) tablet 20 mg    Code Status: Full Code Family Communication: dw wife    Estimated body mass index is 30.63 kg/m as calculated from the following:   Height as of this encounter: 6\' 2"  (1.88 m).   Weight as of this encounter: 108.2 kg.  DVT prophylaxis:xarelto Code Status:full Family Communication:dw wife and daughter on the phone Disposition Plan:  Status is: Inpatient Remains inpatient appropriate because: acute illness   Consultants: none  Procedures:none  Antimicrobials:none Subjective:  Still on 6 L of oxygen No Overnight events objective: Vitals:   04/11/23 1000 04/11/23 1017 04/11/23 1036 04/11/23 1100  BP: (!) 146/76 (!) 153/61 (!) 154/78 (!) 162/93  Pulse: 62 70 75 78  Resp: 15 (!) 22 (!) 22 17  Temp:      TempSrc:      SpO2: 93% 94% 99% 94%  Weight:      Height:        Intake/Output Summary (Last 24 hours) at 04/11/2023 1205 Last data filed at 04/11/2023 1101 Gross per 24 hour  Intake 2386.96 ml  Output 2610 ml  Net -223.04 ml   Filed Weights   04/08/23 0100 04/09/23 0406 04/10/23 0600  Weight: 106.1 kg 106.2 kg 108.2 kg    Examination:  General exam: Seen in mittens Respiratory system: Rhonchi to  auscultation.  Tachypneic cardiovascular system: Irregular brady gastrointestinal system: Abdomen is distended, soft and nontender. No organomegaly or masses felt. Normal bowel sounds heard. Central nervous system: mittens in place Extremities: Edema  Data Reviewed: I have personally reviewed following labs and imaging studies  CBC: Recent Labs  Lab 04/05/23 0735 04/07/23 1251 04/08/23 0830 04/11/23 0309  WBC 7.9 9.8 8.9 5.4  NEUTROABS  --   --   --  3.7  HGB 11.8* 13.1 12.2* 9.8*  HCT 37.7* 43.4 39.2 33.3*  MCV 98.4 100.7* 99.2 104.7*  PLT 346 363 289 258   Basic Metabolic Panel: Recent Labs  Lab 04/07/23 0508 04/08/23 0311 04/08/23 0830 04/09/23 0555 04/09/23 1332 04/09/23 1619 04/10/23 0536 04/10/23 0537 04/11/23 0309  NA 145  --  146* 146*  --   --  145  --  142  K 3.2*  --  2.7* 3.1*  --   --  3.1*  --  3.3*  CL 107  --  108 111  --   --  114*  --  116*  CO2 23  --  26 25  --   --  24  --  22  GLUCOSE 120*  --  123* 139*  --   --  196*  --  159*  BUN 43*  --  58* 60*  --   --  51*  --  41*  CREATININE 2.37*  --  2.91* 2.40*  --   --  1.96*  --  1.78*  CALCIUM 12.2*  --  11.0*  IMSPUN 9.7  --   --  9.2  --  8.3*  MG  --  1.8  --   --  1.8 1.6*  --  2.1 1.8  PHOS  --   --  2.5 3.1 2.8 2.5 2.0* 1.9* 2.7   GFR: Estimated Creatinine Clearance: 55.6 mL/min (A) (by C-G formula based on SCr of 1.78 mg/dL (H)). Liver Function Tests: Recent Labs  Lab 04/06/23 0948 04/07/23 0508 04/08/23 0830 04/09/23 0555 04/10/23 0536 04/11/23 0309  AST 27 26 22   --   --   --   ALT 5 9 7   --   --   --   ALKPHOS 70 72 77  --   --   --   BILITOT 0.8 1.1 1.0  --   --   --   PROT 11.1* 11.2* 10.8*  --   --   --   ALBUMIN 2.0* 2.2* 1.8* 1.7* 1.6* 1.8*   No results for input(s): "LIPASE", "AMYLASE" in the last 168 hours.  Recent Labs  Lab 04/07/23 0601 04/08/23 1852 04/09/23 0555 04/10/23 1401 04/11/23 0309  AMMONIA 67* 61* 63* 48* 66*   Coagulation Profile: No results  for input(s): "INR", "PROTIME" in the last 168 hours.  Cardiac Enzymes: No results for input(s): "CKTOTAL", "CKMB", "CKMBINDEX", "TROPONINI" in the last 168 hours. BNP (last 3 results) No results for input(s): "PROBNP" in the last 8760 hours. HbA1C: No results for input(s): "HGBA1C" in the last 72 hours. CBG: Recent Labs  Lab 04/10/23 1944 04/10/23 2336 04/11/23 0340 04/11/23 0746 04/11/23 1140  GLUCAP 190* 159* 144* 127* 127*   Lipid Profile: Recent Labs    04/10/23 1740  CHOL 76  HDL 15*  LDLCALC 32  TRIG 161  CHOLHDL 5.1   Thyroid Function Tests: No results for input(s): "TSH", "T4TOTAL", "FREET4", "T3FREE", "THYROIDAB" in the last 72 hours.  Anemia Panel: No results for input(s): "VITAMINB12", "FOLATE", "FERRITIN", "TIBC", "IRON", "RETICCTPCT" in the last 72 hours.  Sepsis Labs: Recent Labs  Lab 04/07/23 1315 04/07/23 1552 04/08/23 2359 04/09/23 0555  PROCALCITON 1.34  --   --   --   LATICACIDVEN 2.0* 2.4* 1.3 1.2    Recent Results (from the past 240 hours)  Resp panel by RT-PCR (RSV, Flu A&B, Covid) Anterior Nasal Swab     Status: None   Collection Time: 04/03/23 11:18 AM   Specimen: Anterior Nasal Swab  Result Value Ref Range Status   SARS Coronavirus 2 by RT PCR NEGATIVE NEGATIVE Final    Comment: (NOTE) SARS-CoV-2 target nucleic acids are NOT DETECTED.  The SARS-CoV-2 RNA is generally detectable in upper respiratory specimens during the acute phase of infection. The lowest concentration of SARS-CoV-2 viral copies this assay can detect is 138 copies/mL. A negative result does not preclude SARS-Cov-2 infection and should not be used as the sole basis for treatment or other patient management decisions. A negative result may occur with  improper specimen collection/handling, submission of specimen other than nasopharyngeal swab, presence of viral mutation(s) within the areas targeted by this assay, and inadequate number of viral copies(<138  copies/mL). A negative result must be combined with clinical observations, patient history, and epidemiological information. The expected result is Negative.  Fact Sheet for Patients:  BloggerCourse.com  Fact Sheet for Healthcare Providers:  SeriousBroker.it  This test is no t yet approved or cleared by the [place] FDA and  has been  authorized for detection and/or diagnosis of SARS-CoV-2 by FDA under an Emergency Use Authorization (EUA). This EUA will remain  in effect (meaning this test can be used) for the duration of the COVID-19 declaration under Section 564(b)(1) of the Act, 21 U.S.C.section 360bbb-3(b)(1), unless the authorization is terminated  or revoked sooner.       Influenza A by PCR NEGATIVE NEGATIVE Final   Influenza B by PCR NEGATIVE NEGATIVE Final    Comment: (NOTE) The Xpert Xpress SARS-CoV-2/FLU/RSV plus assay is intended as an aid in the diagnosis of influenza from Nasopharyngeal swab specimens and should not be used as a sole basis for treatment. Nasal washings and aspirates are unacceptable for Xpert Xpress SARS-CoV-2/FLU/RSV testing.  Fact Sheet for Patients: BloggerCourse.com  Fact Sheet for Healthcare Providers: SeriousBroker.it  This test is not yet approved or cleared by the [place] FDA and has been authorized for detection and/or diagnosis of SARS-CoV-2 by FDA under an Emergency Use Authorization (EUA). This EUA will remain in effect (meaning this test can be used) for the duration of the COVID-19 declaration under Section 564(b)(1) of the Act, 21 U.S.C. section 360bbb-3(b)(1), unless the authorization is terminated or revoked.     Resp Syncytial Virus by PCR NEGATIVE NEGATIVE Final    Comment: (NOTE) Fact Sheet for Patients: BloggerCourse.com  Fact Sheet for Healthcare  Providers: SeriousBroker.it  This test is not yet approved or cleared by the [place] FDA and has been authorized for detection and/or diagnosis of SARS-CoV-2 by FDA under an Emergency Use Authorization (EUA). This EUA will remain in effect (meaning this test can be used) for the duration of the COVID-19 declaration under Section 564(b)(1) of the Act, 21 U.S.C. section 360bbb-3(b)(1), unless the authorization is terminated or revoked.  Performed at [facility], [address] [place], [place] [address]   Blood Culture (routine x 2)     Status: None   Collection Time: 04/03/23 11:18 AM   Specimen: BLOOD  Result Value Ref Range Status   Specimen Description   Final    BLOOD LEFT ANTECUBITAL Performed at [facility], [address], [place], [place] [address]    Special Requests   Final    BOTTLES DRAWN AEROBIC AND ANAEROBIC Blood Culture adequate volume Performed at [facility], [address] [place], [place] [address]    Culture   Final    NO GROWTH 5 DAYS Performed at [facility], [address] [place], [place] [address]    Report Status 04/08/2023 FINAL  Final  Blood Culture (routine x 2)     Status: None   Collection Time: 04/03/23 11:18 AM   Specimen: BLOOD RIGHT WRIST  Result Value Ref Range Status   Specimen Description   Final    BLOOD RIGHT WRIST Performed at [facility], [address], [place], [place] [address]    Special Requests   Final    BOTTLES DRAWN AEROBIC AND ANAEROBIC Blood Culture adequate volume Performed at [facility], [address], [place], [place] [address]    Culture   Final    NO GROWTH 5 DAYS Performed at [facility], [address], [place], [place] [address]    Report Status 04/08/2023 FINAL  Final  MRSA Next Gen by PCR, Nasal     Status: None   Collection Time: 04/03/23  5:17 PM   Specimen: Nasal  Mucosa; Nasal Swab  Result Value Ref Range  Status   MRSA by PCR Next Gen NOT DETECTED NOT DETECTED Final    Comment: (NOTE) The GeneXpert MRSA Assay (FDA approved for NASAL specimens only), is one component of a comprehensive MRSA colonization surveillance program. It is not intended to diagnose MRSA infection nor to guide or monitor treatment for MRSA infections. Test performance is not FDA approved in patients less than 17 years old. Performed at [facility], [address], [place], [place] [address]   Culture, blood (Routine X 2) w Reflex to ID Panel     Status: None (Preliminary result)   Collection Time: 04/07/23  1:15 PM   Specimen: BLOOD LEFT HAND  Result Value Ref Range Status   Specimen Description   Final    BLOOD LEFT HAND Performed at [facility], [address] [place], [place] [address]    Special Requests   Final    BOTTLES DRAWN AEROBIC ONLY Blood Culture adequate volume Performed at [facility], [address] [place], [place] [address]    Culture   Final    NO GROWTH 4 DAYS Performed at [facility], [address], [place], [place] [address]    Report Status PENDING  Incomplete  Culture, blood (Routine X 2) w Reflex to ID Panel     Status: None (Preliminary result)   Collection Time: 04/07/23  2:08 PM   Specimen: BLOOD RIGHT ARM  Result Value Ref Range Status   Specimen Description   Final    BLOOD RIGHT ARM Performed at [facility], [address] [place], [place] [address]    Special Requests   Final    BOTTLES DRAWN AEROBIC AND ANAEROBIC Blood Culture results may not be optimal due to an inadequate volume of blood received in culture bottles Performed at [facility], [address], [place], [place] [address]    Culture   Final    NO GROWTH 4 DAYS Performed at [facility], [address] [place], [place] [address]    Report Status PENDING  Incomplete  MRSA  Next Gen by PCR, Nasal     Status: None   Collection Time: 04/10/23  8:22 AM   Specimen: Nasal Mucosa; Nasal Swab  Result Value Ref Range Status   MRSA by PCR Next Gen NOT DETECTED NOT DETECTED Final    Comment: (NOTE) The GeneXpert MRSA Assay (FDA approved for NASAL specimens only), is one component of a comprehensive MRSA colonization surveillance program. It is not intended to diagnose MRSA infection nor to guide or monitor treatment for MRSA infections. Test performance is not FDA approved in patients less than 64 years old. Performed at [facility], [address] [place], [place] [address]          Radiology Studies: CT BONE MARROW BIOPSY & ASPIRATION Result Date: 04/11/2023 INDICATION: [id number] Anemia [id number] 2665 Hypercalcemia 2665 EXAM: CT GUIDED BONE MARROW ASPIRATION AND CORE BIOPSY MEDICATIONS: None. ANESTHESIA/SEDATION: Local anesthetic was administered. The patient was continuously monitored during the procedure by the interventional radiology nurse under my direct supervision. FLUOROSCOPY TIME:  CT dose; 337 mGycm COMPLICATIONS: None immediate. Estimated blood loss: <5 mL PROCEDURE: RADIATION DOSE REDUCTION: This exam was performed according to the departmental dose-optimization program which includes automated exposure control, adjustment of the mA and/or kV according to patient size and/or use of iterative reconstruction technique. Informed written consent was obtained from the patient and/or patient's representative after a thorough  discussion of the procedural risks, benefits and alternatives. All questions were addressed. Maximal Sterile Barrier Technique was utilized including caps, mask, sterile gowns, sterile gloves, sterile drape, hand hygiene and skin antiseptic. A timeout was performed prior to the initiation of the procedure. The patient was positioned prone and non-contrast localization CT was performed of the pelvis to demonstrate the iliac marrow  spaces. Under sterile conditions and local anesthesia, an 11 gauge coaxial bone biopsy needle was advanced into the LEFT iliac marrow space. Needle position was confirmed with CT imaging. Initially, bone marrow aspiration was performed. Next, the 11 gauge outer cannula was utilized to obtain a 2 iliac bone marrow core biopsy. Needle was removed. Hemostasis was obtained with compression. The patient tolerated the procedure well. Samples were prepared with the cytotechnologist. IMPRESSION: Successful CT-guided bone marrow aspiration and biopsy. Roanna Banning, MD [facility] Electronically Signed   By: Roanna Banning M.D.   On: 04/11/2023 11:02   VAS [place] LOWER EXTREMITY VENOUS (DVT) Result Date: 04/10/2023  Lower Venous DVT Study Patient Name:  Ronnie Dunn  Date of Exam:   04/10/2023 Medical Rec #: [id number]      Accession #:    [id number] Date of Birth: 1960-02-12      Patient Gender: M Patient Age:   62 years Exam Location:  [facility] Procedure:      VAS [place] LOWER EXTREMITY VENOUS (DVT) Referring Phys: MURALI RAMASWAMY --------------------------------------------------------------------------------  Indications: Edema.  Performing Technologist: Chanda Busing RVT  Examination Guidelines: A complete evaluation includes B-mode imaging, spectral Doppler, color Doppler, and power Doppler as needed of all accessible portions of each vessel. Bilateral testing is considered an integral part of a complete examination. Limited examinations for reoccurring indications may be performed as noted. The reflux portion of the exam is performed with the patient in reverse Trendelenburg.  +---------+---------------+---------+-----------+----------+--------------+ RIGHT    CompressibilityPhasicitySpontaneityPropertiesThrombus Aging +---------+---------------+---------+-----------+----------+--------------+ CFV      Full           Yes      Yes                                  +---------+---------------+---------+-----------+----------+--------------+ SFJ      Full                                                        +---------+---------------+---------+-----------+----------+--------------+ FV Prox  Full                                                        +---------+---------------+---------+-----------+----------+--------------+ FV Mid   Full                                                        +---------+---------------+---------+-----------+----------+--------------+ FV DistalFull                                                        +---------+---------------+---------+-----------+----------+--------------+  PFV      Full                                                        +---------+---------------+---------+-----------+----------+--------------+ POP      Full           Yes      Yes                                 +---------+---------------+---------+-----------+----------+--------------+ PTV      Full                                                        +---------+---------------+---------+-----------+----------+--------------+ PERO     Full                                                        +---------+---------------+---------+-----------+----------+--------------+   +---------+---------------+---------+-----------+----------+--------------+ LEFT     CompressibilityPhasicitySpontaneityPropertiesThrombus Aging +---------+---------------+---------+-----------+----------+--------------+ CFV      Full           Yes      Yes                                 +---------+---------------+---------+-----------+----------+--------------+ SFJ      Full                                                        +---------+---------------+---------+-----------+----------+--------------+ FV Prox  Full                                                         +---------+---------------+---------+-----------+----------+--------------+ FV Mid   Full                                                        +---------+---------------+---------+-----------+----------+--------------+ FV DistalFull                                                        +---------+---------------+---------+-----------+----------+--------------+ PFV      Full                                                        +---------+---------------+---------+-----------+----------+--------------+  POP      Full           Yes      Yes                                 +---------+---------------+---------+-----------+----------+--------------+ PTV      Full                                                        +---------+---------------+---------+-----------+----------+--------------+ PERO     Full                                                        +---------+---------------+---------+-----------+----------+--------------+     Summary: RIGHT: - There is no evidence of deep vein thrombosis in the lower extremity.  - No cystic structure found in the popliteal fossa.  LEFT: - There is no evidence of deep vein thrombosis in the lower extremity.  - No cystic structure found in the popliteal fossa.  *See table(s) above for measurements and observations. Electronically signed by Gerarda Fraction on 04/10/2023 at 6:15:38 PM.    Final    CT CHEST WO CONTRAST Result Date: 04/09/2023 CLINICAL DATA:  Respiratory failure. EXAM: CT CHEST WITHOUT CONTRAST TECHNIQUE: Multidetector CT imaging of the chest was performed following the standard protocol without IV contrast. RADIATION DOSE REDUCTION: This exam was performed according to the departmental dose-optimization program which includes automated exposure control, adjustment of the mA and/or kV according to patient size and/or use of iterative reconstruction technique. COMPARISON:  04/07/2023. FINDINGS: Cardiovascular:  Atherosclerotic calcification of the aorta, aortic valve and coronary arteries. Ascending aorta measures 4.0 cm (coronal image 66), stable. Enlarged pulmonic trunk and heart. Small pericardial effusion, similar. Mediastinum/Nodes: Thoracic inlet lymph nodes are not enlarged by CT size criteria. Mediastinal lymph nodes are not enlarged by CT size criteria. Hilar regions are difficult to definitively evaluate without IV contrast. No axillary adenopathy. Feeding tube in the esophagus. Lungs/Pleura: Image quality is degraded by expiratory phase imaging and respiratory motion. Centrilobular and paraseptal emphysema. Dependent atelectasis in the right lower lobe. Collapse/consolidation in the left lower lobe, as on 04/07/2023. No definite pleural fluid. Airway is unremarkable. Upper Abdomen: Feeding tube is followed into the gastric antrum but is not imaged distally. Visualized portions of the liver, adrenal glands, left kidney, spleen, pancreas, stomach and bowel are otherwise grossly unremarkable. No upper abdominal adenopathy. Musculoskeletal: Degenerative changes in the spine. Segmental lytic lesion and old fracture involving the left third posterior rib. Lucency in the C7 vertebral body (8/3). There may be a lytic lesion in the manubrium. Additional lytic lesion in the posterior left eighth rib. IMPRESSION: 1. Similar left lower lobe collapse/consolidation, possibly due to pneumonia. 2. Scattered lytic lesions, worrisome for multiple myeloma. 3. Small pericardial effusion, similar. 4. 4.0 cm ascending aortic aneurysm. Recommend annual imaging followup by CTA or MRA. This recommendation follows 2010 ACCF/AHA/AATS/ACR/ASA/SCA/SCAI/SIR/STS/SVM Guidelines for the Diagnosis and Management of Patients with Thoracic Aortic Disease. Circulation. 2010; 121: Z610-R604. Aortic aneurysm NOS (ICD10-I71.9). 5. Aortic atherosclerosis (ICD10-I70.0). Coronary artery calcification. 6. Enlarged pulmonic trunk, indicative of pulmonary  arterial  hypertension. Electronically Signed   By: Leanna Battles M.D.   On: 04/09/2023 15:30   Scheduled Meds:  arformoterol  15 mcg Nebulization BID   budesonide (PULMICORT) nebulizer solution  0.5 mg Nebulization BID   Chlorhexidine Gluconate Cloth  6 each Topical Daily   famotidine  40 mg Per Tube Daily   feeding supplement (PROSource TF20)  60 mL Per Tube BID   folic acid  1 mg Per Tube Daily   hydrALAZINE  25 mg Per Tube TID   lactulose  30 g Per Tube BID   multivitamin  15 mL Per Tube Daily   nicotine  21 mg Transdermal Daily   mouth rinse  15 mL Mouth Rinse 4 times per day   pantoprazole (PROTONIX) IV  40 mg Intravenous Q24H   revefenacin  175 mcg Nebulization Daily   rivaroxaban  20 mg Per Tube Q supper   thiamine  100 mg Oral Daily   Or   thiamine  100 mg Intravenous Daily   Continuous Infusions:  dexmedetomidine (PRECEDEX) IV infusion 1.1 mcg/kg/hr (04/11/23 1122)   feeding supplement (OSMOLITE 1.5 CAL) Stopped (04/11/23 0000)   potassium chloride 10 mEq (04/11/23 1204)     LOS: 8 days   Time spent: 35 min  Alwyn Ren, MD  04/11/2023, 12:05 PM

## 2023-04-11 NOTE — Progress Notes (Signed)
 Mr. Ronnie Dunn is sedated this morning.  He had a Doppler of his legs yesterday.  This was negative for any blood clots.  He is going to have a bone marrow biopsy today.  We still are not able to get the myeloma/monoclonal protein studies that we need.  I will keep trying.  His white cell count is 5.4.  Hemoglobin 9.8.  Platelet count 258,000.  Sodium is 142.  Potassium 3.3.  BUN 41 creatinine 1.78.  Calcium 8.3 with albumin of 1.8.  His total protein was not measured.  His ammonia is 66.  He had echocardiogram done on 04/09/2023.  This showed a decent left ventricular ejection fraction.  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  Whether this is myeloma or possibly Waldenstrm's, I think the bone marrow biopsy will tell.  Hopefully, we can get his monoclonal studies and see where his immunoglobulins are.  We tried to get a serum viscosity and this was not possible.  I do not know if there is some kind of protein interference that might be causing some of the lab issues.   All of his vital signs are pretty stable.  Temperature 98.5.  Pulse 79.  Blood pressure 159/63.  His lungs sound relatively clear bilaterally.  Oral exam does show dry oral mucosa.  He is getting tube feeds.  His cardiac exam regular rate and rhythm.  Abdomen is obese but soft.  Bowel sounds are somewhat decreased.  There is no obvious fluid wave.  Extremity shows some slight swelling in the legs.  Again, I had to believe we are dealing with a plasma lymphocytic problem.  We will have to see with the bone marrow test shows.  His calcium certainly has gotten better.  I am happy about this part.  We will still have to follow along.  Hopefully, we can have some clarity as to what might be going on with the bone marrow biopsy.  Christin Bach, MD  Charmian Muff 5:14

## 2023-04-11 NOTE — Progress Notes (Signed)
 Attempted to place a bridle to secure Cortrak tube but unable to be placed despite numerous attempts. Tube remains taped in place, marking did not change.   Shelle Iron RD, LDN Contact via [employer].

## 2023-04-11 NOTE — Progress Notes (Signed)
 Nutrition Follow-up  DOCUMENTATION CODES:   Not applicable  INTERVENTION:  - Restart tube feeds as medically appropriate and continue advancing to goal as tolerated. Osmolite 1.5 at 60 ml/h (1440 ml per day) Prosource TF20 60 ml BID Provides 2320 kcal, 130 gm protein, 1097 ml free water daily   - Monitor magnesium, potassium, and phosphorus BID for at least 3 days, MD to replete as needed, as pt may be at risk for refeeding syndrome.   - FWF per CCM. Consider Q4H.   - Continue multivitamin with minerals, folic acid, and thiamine for history of etoh use.   - Monitor weight trends.   - Will monitor for diet advancement.   NUTRITION DIAGNOSIS:   Inadequate oral intake related to inability to eat as evidenced by NPO status. *ongoing  GOAL:   Patient will meet greater than or equal to 90% of their needs *progressing with TF  MONITOR:   Diet advancement, Labs, Weight trends  REASON FOR ASSESSMENT:   Other (Comment) (Cortrak)    ASSESSMENT:   63 y.o. male who has a PMH significant for COPD, OSA on CPAP, CHF, HTN, HLD, alcohol use who presented with cough, fever/chills, nausea, weakness, fatigue, dyspnea x 2 weeks. Admitted for sepsis and acute metabolic encephalopathy.  3/17 Admit 3/21 Rapid called, patient with AMS, transferred to ICU; Cortrak placed 3/23 Osmolite 1.5 started @ 20 in PM  Patient out of room at time of initial visit, getting bone marrow biopsy today.   Remains NPO due to patient sedated and not following commands.  TF reached 52mL/hr yesterday but TF stopped at midnight for IR bone marrow biopsy today.  Per discussion with RN, patient was tolerating tube feeds well.   Bridle unable to be placed yesterday. Once patient back in room after biopsy, this RD attempted bridle today and still unable to be placed. Cortrak remains secured via tape.  Restarting tube feeds at last reached rate of 62mL/hr today and plan to continue advancing to goal as  tolerated.   Admit weight: 245# Current weight: 238# I&O's: +3.3L since admit  *generalized edema; right and left upper and lower extremity    Medications reviewed and include: 1mg  folic acid, MVI, 100mg  thiamine Precedex   Labs reviewed:  K+ 3.3 Creatinine 1.78 HA1C 7.2 (in June 2023) Blood Glucose 127-190 x24 hours  Diet Order:   Diet Order             Diet NPO time specified  Diet effective midnight                   EDUCATION NEEDS:  Education needs have been addressed  Skin:  Skin Assessment: Skin Integrity Issues: Skin Integrity Issues:: DTI DTI: Mid Right Back  Last BM:  3/24 - rectal tube  Height:  Ht Readings from Last 1 Encounters:  04/08/23 6\' 2"  (1.88 m)   Weight:  Wt Readings from Last 1 Encounters:  04/10/23 108.2 kg   Ideal Body Weight:  86.36 kg  BMI:  Body mass index is 30.63 kg/m.  Estimated Nutritional Needs:  Kcal:  2300-2500 kcals Protein:  110-130 grams Fluid:  >/= 2.3L    Shelle Iron RD, LDN Contact via Secure Chat.

## 2023-04-11 NOTE — Plan of Care (Signed)
  Problem: Activity: Goal: Risk for activity intolerance will decrease Outcome: Progressing   Problem: Coping: Goal: Level of anxiety will decrease Outcome: Not Progressing   

## 2023-04-11 NOTE — Progress Notes (Signed)
 EEG complete - results pending

## 2023-04-11 NOTE — Progress Notes (Signed)
 OT Cancellation Note  Patient Details Name: KIRIL HIPPE MRN: [id number] DOB: 06/15/1960   Cancelled Treatment:    Reason Eval/Treat Not Completed: Patient at procedure or test/ unavailable Patient is off the floor for bone marrow biopsy at this time. OT to continue to follow and check bac on 3/26.  Rosalio Loud, MS Acute Rehabilitation Department Office# [phone]  04/11/2023, 10:31 AM

## 2023-04-11 NOTE — Progress Notes (Signed)
 NAME:  Ronnie Dunn, MRN:  [id number], DOB:  Jan 11, 1961, LOS: 8 ADMISSION DATE:  04/03/2023, CONSULTATION DATE:  04/07/23 REFERRING MD:  Ronnie Dunn CHIEF COMPLAINT:  AMS   BRIEF  Ronnie Dunn is a 63 y.o. male who has a PMH as below including but not limited to COPD, OSA on CPAP, chronic diastolic CHF, HTN, HLD, PAF on Xarelto, tobacco dependence, alcohol use.  He is followed by [name] as an outpatient. He presented to [facility] ED on 3/17 with cough, fever/chills, nausea, weakness, fatigue, dyspnea x 2 weeks.  He had some confusion on and off and decreased appetite.  He had also complained of low back pain which was new and was not associated with any recent fall or trauma.  He had productive sputum, green in color.  He recently completed a course of azithromycin as an outpatient but had no relief with this.  Due to ongoing symptoms, he presented to the ED for further workup.  Initial chest x-ray showed bilateral interstitial opacities.  He was started on IV Lasix.  Renal function worsened, he had renal ultrasound on 3/19 that showed normal kidneys.  Follow-up chest x-ray showed cardiomegaly and ongoing interstitial opacities with small pleural effusions.  Diuresis was continued; however, renal function worsened further.  Was initially on vancomycin and cefepime however cefepime was stopped due to altered mental status.  This was switched to ceftriaxone on 3/19.  Overnight 3/20, he developed A-fib RVR.  He received Lopressor with improvement in heart rate.  Early a.m. 3/21 he had altered mental status and new fever to 102.9 rectally.  He was subsequently transferred to the ICU and PCCM and infectious disease were asked to see in consultation.  Upon arrival to the ICU, he is minimally responsive but is protecting his airway.  He has heart rates in the 110s to 120s, blood pressure stable.  Mucous membranes are very dry.  ABG was obtained and was normal (7.52/41/21). He has clear urine in his Foley.   Antibiotics were escalated from ceftriaxone to Zosyn.  Infectious disease has seen him and we have agreed to assess CT chest/abdomen/pelvis.  Pertinent  Medical History:  has Seizure (HCC); Hyponatremia; Alcohol use disorder, moderate, dependence (HCC); Class 1 obesity; Essential hypertension; Type 2 diabetes mellitus without complication, without long-term current use of insulin (HCC); Chronic obstructive pulmonary disease (HCC); Gastroesophageal reflux disease; Lower extremity edema; Permanent atrial fibrillation (HCC); OSA (obstructive sleep apnea); Chronic heart failure with preserved ejection fraction (HCC); Seizures (HCC); Protein-calorie malnutrition, moderate (HCC); Hypomagnesemia; Alcohol abuse; Obesity (BMI 30-39.9); Noncompliance with medication regimen; Aortic root dilation (HCC); Acquired thrombophilia (HCC); Pneumonia due to infectious organism; AKI (acute kidney injury) (HCC); Acute metabolic encephalopathy; Encephalopathy; and Hypokalemia on their problem list.  Significant Hospital Events: Including procedures, antibiotic start and stop dates in addition to other pertinent events   3/17 admit 3.19 - BNPP 800 3/20 - HFNC O2 need 3/21 - Altered but protecting airway. Vitals stable.Marland Kitchen 7L [place] O2 need. BN 275 (down from 813)  - ID Consult: Fever, sepsis of unknown source--potentially due to aspiration in context of encephalopathy. Alcoholic liver disease. 3/AKI. 4Encephalopathy --presumably from alcohol withdrawal, hepatic encaphalopathy though no cirrhosis on CT at [facility] in 2019   3/22 - HEme conslt - cocern for myleoma v Waldenstrom ( .  His total protein is quite high.  His calcium is quite high.  His albumin is low.) Getting seruum viscolity due to Acue encephalpthy. Pan CT   - unremarlkable. ID thinks low  odds for infection. ID recommending MRI. RN/Wife report Obutndatioin with restlessness - on prn ativan. Last drink > 2 weeks go . Wife describes 2022 admit for Low NA - and seziure  like activity but no EEG or neuro conslt in record.  Started Precedex.  Antibiotics all stopped by ID  3/23 cerebral EEG without any seizures but showing encephalopathy.  MRI brain could not be done because patient desaturated while supine. Per RN "His blood is too thick to obtain a serum protein, parathyroid, viscosity. " Ammonia 65.    Interim History / Subjective:   Remains on Precedex remains on Precedex Mild breakthrough agitation On 6 L nasal cannula    Objective:  Blood pressure 131/76, pulse 78, temperature (!) 97.3 F (36.3 C), temperature source Axillary, resp. rate 17, height 6\' 2"  (1.88 m), weight 108.2 kg, SpO2 96%.        Intake/Output Summary (Last 24 hours) at 04/11/2023 1539 Last data filed at 04/11/2023 1444 Gross per 24 hour  Intake 1915.7 ml  Output 1460 ml  Net 455.7 ml   Filed Weights   04/08/23 0100 04/09/23 0406 04/10/23 0600  Weight: 106.1 kg 106.2 kg 108.2 kg    Examination: General Appearance:  Looks criticall ill OBESE - + Head:  Normocephalic, without obvious abnormality, atraumatic Eyes: Mild pallor, no icterus Nose:  G tube - yes Neck:  Supple,  No lymphadenopathy Lungs: Clear breath sounds bilateral, no accessory muscle use Heart:  S1 and S2 normal, no murmur, Abdomen:  Soft, no masses, no organomegaly Extremities: No deformity, no edema Skin:  ntact in exposed areas . Sacral area - not examined Neurologic: Sedate, RASS -1 on Precedex, does not follow commands   Labs show hypokalemia 3.3, BUN/creatinine 41/1.8, no leukocytosis, stable anemia   Labs/imaging personally reviewed:  CT head 3/19 > neg. Renal US 3/19 > neg. CT chest/abd/pelv 3/21 > infrarenal AAA Cerebral EEG 04/08/2023: No seizures. CT chest without con 3/23 collapse consolidation left lower lobe , scattered lytic lesions  Assessment & Plan:  HX OF ETOH Acute metabolic encephalopathy - multifactorial 2/2 hyperammonemia, EtOH, presumed early developing sepsis of unclear  etiology = . CT head neg, TSH, ABG normal. Folate elevated, B12 pending. -Stable EEG 04/08/2023: No seizures but shows encephalopathy. Unable to do MRI because of desaturations  More history obtained from wife -he was a heavy drinker has not consumed alcohol for 1 week prior to admission, heavy smoker  PLAN   -  Regular EEG ordered for 04/10/2023 - MRI brain wo contrast (has low GFR) -> when possible and more stable  -Continue PRCEDEX, goal RASS 0 - avoid cephalosporins - PRN Haldol, low-dose Ativan for breakthrough -Nicotine patch  Concern for sepsis y - some suspicion for early aspiration PNA.  Left lower lobe collapse/consolidation oral antibiotic stopped 04/08/2023 infectious diseases  04/09/2023: Off antibiotics.  ID consult does not think patient has infectious problem  Plan - Monitor off antibiotics -Incentive spirometry   Acute hypoxic respiratory failure - 2/2 above + baseline COPD (though not on O2), OSA. -Baseline 2 L nasal cannula at night Hx COPD, OSA, tobacco dependence.  3/22 and 3/23  - protecing airway but at risk for decompensation.  Needing 6 L oxygen off for several days..  Encephalopathy prevents BiPAP or CPAP  PLNA - Continue supplemental O2 as needed to maintain SpO2 > 92%. -triple therapy nebs in lieu of PTA Trelegy. -Smoking cessation counseling at discharge  AKI.-Initially due to hypercalcemia, improved and then worsened again likely  due to diuresis  PLAN Per renal   Hypercalcemia Hypokalemia High-protein,Hypoalbiuminema Hyperviscosity - lab unable to sample many test. 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 Seen by Dr Myna Hidalgo 04/08/23  Plan  - - calcitonin per heme - 24h UPEP/UIFFE.Light chainrs - pending -Bone marrow biopsy planned today   Hx dCHF, A.fib on Xarelto, HTN, HLD.  -Echo 04/08/2023: Normal left ventricle ejection fraction but right ventricular dysfunction present and is new compared to July 2024.  Currently on schedled lopressr and hydralazine.  Having bradycardia with Precedex.  PLAN  -Continue hydralazine scheduled - Continue Xarelto.    Best practice (evaluated daily):  Diet/type: NPO -RD consultation for tube feeds DVT prophylaxis: DOAC Xarelto Pressure ulcer(s): pressure ulcer assessment deferred  GI prophylaxis: Pepcid Lines: N/A Foley:  N/A Code Status:  full code Last date of multidisciplinary goals of care discussion: None yet.  Updated wife at bedside   ATTESTATION & SIGNATURE    Cyril Mourning MD. [facility] [place] Pager : [phone]  If no response to pager , please call [phone] until 7 pm After 7:00 pm call Elink  [phone]   04/11/2023   04/11/2023 3:39 PM     LABS    PULMONARY Recent Labs  Lab 04/06/23 1633 04/07/23 1026  PHART  --  7.52*  PCO2ART  --  41  PO2ART  --  101  HCO3 32.8* 33.1*  O2SAT 91.9 98.5    CBC Recent Labs  Lab 04/07/23 1251 04/08/23 0830 04/11/23 0309  HGB 13.1 12.2* 9.8*  HCT 43.4 39.2 33.3*  WBC 9.8 8.9 5.4  PLT 363 289 258    COAGULATION No results for input(s): "INR" in the last 168 hours.   CARDIAC  No results for input(s): "TROPONINI" in the last 168 hours. No results for input(s): "PROBNP" in the last 168 hours.   CHEMISTRY Recent Labs  Lab 04/07/23 0508 04/08/23 0311 04/08/23 0830 04/09/23 0555 04/09/23 1332 04/09/23 1619 04/10/23 0536 04/10/23 0537 04/11/23 0309  NA 145  --  146* 146*  --   --  145  --  142  K 3.2*  --  2.7* 3.1*  --   --  3.1*  --  3.3*  CL 107  --  108 111  --   --  114*  --  116*  CO2 23  --  26 25  --   --  24  --  22  GLUCOSE 120*  --  123* 139*  --   --  196*  --  159*  BUN 43*  --  58* 60*  --   --  51*  --  41*  CREATININE 2.37*  --  2.91* 2.40*  --   --  1.96*  --  1.78*  CALCIUM 12.2*  --  11.0*  IMSPUN 9.7  --   --  9.2  --  8.3*  MG  --  1.8  --   --  1.8 1.6*  --  2.1 1.8  PHOS  --   --  2.5 3.1 2.8 2.5 2.0* 1.9* 2.7    Estimated Creatinine Clearance: 55.6 mL/min (A) (by C-G formula based on SCr of 1.78 mg/dL (H)).   LIVER Recent Labs  Lab 04/06/23 0948 04/07/23 0508 04/08/23 0830 04/09/23 0555 04/10/23 0536 04/11/23 0309  AST 27 26 22   --   --   --   ALT 5 9 7   --   --   --  ALKPHOS 70 72 77  --   --   --   BILITOT 0.8 1.1 1.0  --   --   --   PROT 11.1* 11.2* 10.8*  --   --   --   ALBUMIN 2.0* 2.2* 1.8* 1.7* 1.6* 1.8*     INFECTIOUS Recent Labs  Lab 04/07/23 1315 04/07/23 1552 04/08/23 2359 04/09/23 0555  LATICACIDVEN 2.0* 2.4* 1.3 1.2  PROCALCITON 1.34  --   --   --      ENDOCRINE CBG (last 3)  Recent Labs    04/11/23 0340 04/11/23 0746 04/11/23 1140  GLUCAP 144* 127* 127*

## 2023-04-11 NOTE — Progress Notes (Signed)
 PT Cancellation Note  Patient Details Name: Ronnie Dunn MRN: [id number] DOB: [birth year]   Cancelled Treatment:    Reason Eval/Treat Not Completed: Patient at procedure or test/unavailable To have  a biopsy today.  Blanchard Kelch PT Acute Rehabilitation Services Office [phone] Weekend pager-[phone]   [name] 04/11/2023, 12:16 PM

## 2023-04-11 NOTE — Procedures (Signed)
 Patient Name: Ronnie Dunn  MRN: [id number]  Epilepsy Attending: Charlsie Quest  Referring Physician/Provider: Kalman Shan, MD  Date: 3/25/225 Duration: 22.47 mins  Patient history: 63yo M with ams. EEG to evaluate for seizure  Level of alertness: Awake  AEDs during EEG study: None  Technical aspects: This EEG study was done with scalp electrodes positioned according to the 10-20 International system of electrode placement. Electrical activity was reviewed with band pass filter of 1-70Hz , sensitivity of 7 uV/mm, display speed of 81mm/sec with a 60Hz  notched filter applied as appropriate. EEG data were recorded continuously and digitally stored.  Video monitoring was available and reviewed as appropriate.  Description: EEG showed continuous generalized 3 to 6 Hz theta-delta slowing. Hyperventilation and photic stimulation were not performed.    Of note, study was technically difficult due to myogenic artifact.    ABNORMALITY - Continuous slow, generalized  IMPRESSION: This technically difficult study is suggestive of moderate diffuse encephalopathy. No seizures or epileptiform discharges were seen throughout the recording.  Trellis Guirguis Annabelle Harman

## 2023-04-11 NOTE — Progress Notes (Signed)
 Patient ID: Ronnie Dunn, male   DOB: 04-19-1960, 63 y.o.   MRN: [id number] S: Pt unresponsive this morning O:BP (!) 148/83   Pulse 68   Temp (!) 97.3 F (36.3 C) (Axillary)   Resp 16   Ht 6\' 2"  (1.88 m)   Wt 108.2 kg   SpO2 95%   BMI 30.63 kg/m   Intake/Output Summary (Last 24 hours) at 04/11/2023 1404 Last data filed at 04/11/2023 1320 Gross per 24 hour  Intake 2193.3 ml  Output 2110 ml  Net 83.3 ml   Intake/Output: I/O last 3 completed shifts: In: 5604.9 [I.V.:3476.1; Other:20; NG/GT:1354.3; IV Piggyback:754.5] Out: 3935 [Urine:2885; Stool:1050]  Intake/Output this shift:  Total I/O In: 560.1 [I.V.:261.5; NG/GT:120; IV Piggyback:178.6] Out: 300 [Urine:300] Weight change:  Gen: NAD, sleeping and unable to arouse CVS:RRR Resp: CTA Abd: +BS, soft, NT/ND Ext: no edema  Recent Labs  Lab 04/06/23 0515 04/06/23 0948 04/07/23 0508 04/08/23 0830 04/09/23 0555 04/09/23 1332 04/09/23 1619 04/10/23 0536 04/10/23 0537 04/11/23 0309  NA 137 142 145 146* 146*  --   --  145  --  142  K 2.6* 2.8* 3.2* 2.7* 3.1*  --   --  3.1*  --  3.3*  CL 96* 100 107 108 111  --   --  114*  --  116*  CO2 29 25 23 26 25   --   --  24  --  22  GLUCOSE 128* 120* 120* 123* 139*  --   --  196*  --  159*  BUN 32* 33* 43* 58* 60*  --   --  51*  --  41*  CREATININE 1.99* 2.10* 2.37* 2.91* 2.40*  --   --  1.96*  --  1.78*  ALBUMIN  --  2.0* 2.2* 1.8* 1.7*  --   --  1.6*  --  1.8*  CALCIUM 11.8* 12.3* 12.2* 11.0*  IMSPUN 9.7  --   --  9.2  --  8.3*  PHOS  --  3.9  --  2.5 3.1 2.8 2.5 2.0* 1.9* 2.7  AST  --  27 26 22   --   --   --   --   --   --   ALT  --  5 9 7   --   --   --   --   --   --    Liver Function Tests: Recent Labs  Lab 04/06/23 0948 04/07/23 0508 04/08/23 0830 04/09/23 0555 04/10/23 0536 04/11/23 0309  AST 27 26 22   --   --   --   ALT 5 9 7   --   --   --   ALKPHOS 70 72 77  --   --   --   BILITOT 0.8 1.1 1.0  --   --   --   PROT 11.1* 11.2* 10.8*  --   --   --   ALBUMIN  2.0* 2.2* 1.8* 1.7* 1.6* 1.8*   No results for input(s): "LIPASE", "AMYLASE" in the last 168 hours. Recent Labs  Lab 04/09/23 0555 04/10/23 1401 04/11/23 0309  AMMONIA 63* 48* 66*   CBC: Recent Labs  Lab 04/05/23 0735 04/07/23 1251 04/08/23 0830 04/11/23 0309  WBC 7.9 9.8 8.9 5.4  NEUTROABS  --   --   --  3.7  HGB 11.8* 13.1 12.2* 9.8*  HCT 37.7* 43.4 39.2 33.3*  MCV 98.4 100.7* 99.2 104.7*  PLT 346 363 289 258   Cardiac Enzymes: No results for  input(s): "CKTOTAL", "CKMB", "CKMBINDEX", "TROPONINI" in the last 168 hours. CBG: Recent Labs  Lab 04/10/23 1944 04/10/23 2336 04/11/23 0340 04/11/23 0746 04/11/23 1140  GLUCAP 190* 159* 144* 127* 127*    Iron Studies: No results for input(s): "IRON", "TIBC", "TRANSFERRIN", "FERRITIN" in the last 72 hours. Studies/Results: CT BONE MARROW BIOPSY & ASPIRATION Result Date: 04/11/2023 INDICATION: [id number] Anemia [id number] Hypercalcemia 2665 EXAM: CT GUIDED BONE MARROW ASPIRATION AND CORE BIOPSY MEDICATIONS: None. ANESTHESIA/SEDATION: Local anesthetic was administered. The patient was continuously monitored during the procedure by the interventional radiology nurse under my direct supervision. FLUOROSCOPY TIME:  CT dose; 337 mGycm COMPLICATIONS: None immediate. Estimated blood loss: <5 mL PROCEDURE: RADIATION DOSE REDUCTION: This exam was performed according to the departmental dose-optimization program which includes automated exposure control, adjustment of the mA and/or kV according to patient size and/or use of iterative reconstruction technique. Informed written consent was obtained from the patient and/or patient's representative after a thorough discussion of the procedural risks, benefits and alternatives. All questions were addressed. Maximal Sterile Barrier Technique was utilized including caps, mask, sterile gowns, sterile gloves, sterile drape, hand hygiene and skin antiseptic. A timeout was performed prior to the initiation of  the procedure. The patient was positioned prone and non-contrast localization CT was performed of the pelvis to demonstrate the iliac marrow spaces. Under sterile conditions and local anesthesia, an 11 gauge coaxial bone biopsy needle was advanced into the LEFT iliac marrow space. Needle position was confirmed with CT imaging. Initially, bone marrow aspiration was performed. Next, the 11 gauge outer cannula was utilized to obtain a 2 iliac bone marrow core biopsy. Needle was removed. Hemostasis was obtained with compression. The patient tolerated the procedure well. Samples were prepared with the cytotechnologist. IMPRESSION: Successful CT-guided bone marrow aspiration and biopsy. Roanna Banning, MD [facility] Radiology Electronically Signed   By: Roanna Banning M.D.   On: 04/11/2023 11:02   VAS [place] LOWER EXTREMITY VENOUS (DVT) Result Date: 04/10/2023  Lower Venous DVT Study Patient Name:  Ronnie Dunn  Date of Exam:   04/10/2023 Medical Rec #: [id number]      Accession #:    [id number] Date of Birth: [birth year]      Patient Gender: M Patient Age:   12 years Exam Location:  [facility] Procedure:      VAS [place] LOWER EXTREMITY VENOUS (DVT) Referring Phys: MURALI RAMASWAMY --------------------------------------------------------------------------------  Indications: Edema.  Performing Technologist: Chanda Busing RVT  Examination Guidelines: A complete evaluation includes B-mode imaging, spectral Doppler, color Doppler, and power Doppler as needed of all accessible portions of each vessel. Bilateral testing is considered an integral part of a complete examination. Limited examinations for reoccurring indications may be performed as noted. The reflux portion of the exam is performed with the patient in reverse Trendelenburg.  +---------+---------------+---------+-----------+----------+--------------+ RIGHT     CompressibilityPhasicitySpontaneityPropertiesThrombus Aging +---------+---------------+---------+-----------+----------+--------------+ CFV      Full           Yes      Yes                                 +---------+---------------+---------+-----------+----------+--------------+ SFJ      Full                                                        +---------+---------------+---------+-----------+----------+--------------+  FV Prox  Full                                                        +---------+---------------+---------+-----------+----------+--------------+ FV Mid   Full                                                        +---------+---------------+---------+-----------+----------+--------------+ FV DistalFull                                                        +---------+---------------+---------+-----------+----------+--------------+ PFV      Full                                                        +---------+---------------+---------+-----------+----------+--------------+ POP      Full           Yes      Yes                                 +---------+---------------+---------+-----------+----------+--------------+ PTV      Full                                                        +---------+---------------+---------+-----------+----------+--------------+ PERO     Full                                                        +---------+---------------+---------+-----------+----------+--------------+   +---------+---------------+---------+-----------+----------+--------------+ LEFT     CompressibilityPhasicitySpontaneityPropertiesThrombus Aging +---------+---------------+---------+-----------+----------+--------------+ CFV      Full           Yes      Yes                                 +---------+---------------+---------+-----------+----------+--------------+ SFJ      Full                                                         +---------+---------------+---------+-----------+----------+--------------+ FV Prox  Full                                                        +---------+---------------+---------+-----------+----------+--------------+  FV Mid   Full                                                        +---------+---------------+---------+-----------+----------+--------------+ FV DistalFull                                                        +---------+---------------+---------+-----------+----------+--------------+ PFV      Full                                                        +---------+---------------+---------+-----------+----------+--------------+ POP      Full           Yes      Yes                                 +---------+---------------+---------+-----------+----------+--------------+ PTV      Full                                                        +---------+---------------+---------+-----------+----------+--------------+ PERO     Full                                                        +---------+---------------+---------+-----------+----------+--------------+     Summary: RIGHT: - There is no evidence of deep vein thrombosis in the lower extremity.  - No cystic structure found in the popliteal fossa.  LEFT: - There is no evidence of deep vein thrombosis in the lower extremity.  - No cystic structure found in the popliteal fossa.  *See table(s) above for measurements and observations. Electronically signed by Gerarda Fraction on 04/10/2023 at 6:15:38 PM.    Final    CT CHEST WO CONTRAST Result Date: 04/09/2023 CLINICAL DATA:  Respiratory failure. EXAM: CT CHEST WITHOUT CONTRAST TECHNIQUE: Multidetector CT imaging of the chest was performed following the standard protocol without IV contrast. RADIATION DOSE REDUCTION: This exam was performed according to the departmental dose-optimization program which includes automated exposure control,  adjustment of the mA and/or kV according to patient size and/or use of iterative reconstruction technique. COMPARISON:  04/07/2023. FINDINGS: Cardiovascular: Atherosclerotic calcification of the aorta, aortic valve and coronary arteries. Ascending aorta measures 4.0 cm (coronal image 66), stable. Enlarged pulmonic trunk and heart. Small pericardial effusion, similar. Mediastinum/Nodes: Thoracic inlet lymph nodes are not enlarged by CT size criteria. Mediastinal lymph nodes are not enlarged by CT size criteria. Hilar regions are difficult to definitively evaluate without IV contrast. No axillary adenopathy. Feeding tube in the esophagus. Lungs/Pleura: Image quality is degraded by expiratory phase imaging and respiratory motion. Centrilobular and paraseptal emphysema.  Dependent atelectasis in the right lower lobe. Collapse/consolidation in the left lower lobe, as on 04/07/2023. No definite pleural fluid. Airway is unremarkable. Upper Abdomen: Feeding tube is followed into the gastric antrum but is not imaged distally. Visualized portions of the liver, adrenal glands, left kidney, spleen, pancreas, stomach and bowel are otherwise grossly unremarkable. No upper abdominal adenopathy. Musculoskeletal: Degenerative changes in the spine. Segmental lytic lesion and old fracture involving the left third posterior rib. Lucency in the C7 vertebral body (8/3). There may be a lytic lesion in the manubrium. Additional lytic lesion in the posterior left eighth rib. IMPRESSION: 1. Similar left lower lobe collapse/consolidation, possibly due to pneumonia. 2. Scattered lytic lesions, worrisome for multiple myeloma. 3. Small pericardial effusion, similar. 4. 4.0 cm ascending aortic aneurysm. Recommend annual imaging followup by CTA or MRA. This recommendation follows 2010 ACCF/AHA/AATS/ACR/ASA/SCA/SCAI/SIR/STS/SVM Guidelines for the Diagnosis and Management of Patients with Thoracic Aortic Disease. Circulation. 2010; 121: N829-F621.  Aortic aneurysm NOS (ICD10-I71.9). 5. Aortic atherosclerosis (ICD10-I70.0). Coronary artery calcification. 6. Enlarged pulmonic trunk, indicative of pulmonary arterial hypertension. Electronically Signed   By: Leanna Battles M.D.   On: 04/09/2023 15:30    arformoterol  15 mcg Nebulization BID   budesonide (PULMICORT) nebulizer solution  0.5 mg Nebulization BID   Chlorhexidine Gluconate Cloth  6 each Topical Daily   famotidine  40 mg Per Tube Daily   feeding supplement (PROSource TF20)  60 mL Per Tube BID   folic acid  1 mg Per Tube Daily   hydrALAZINE  25 mg Per Tube TID   lactulose  30 g Per Tube BID   multivitamin  15 mL Per Tube Daily   nicotine  21 mg Transdermal Daily   mouth rinse  15 mL Mouth Rinse 4 times per day   pantoprazole (PROTONIX) IV  40 mg Intravenous Q24H   revefenacin  175 mcg Nebulization Daily   rivaroxaban  20 mg Per Tube Q supper   thiamine  100 mg Oral Daily   Or   thiamine  100 mg Intravenous Daily    BMET    Component Value Date/Time   NA 142 04/11/2023 0309   NA 128 (L) 06/28/2022 0917   K 3.3 (L) 04/11/2023 0309   CL 116 (H) 04/11/2023 0309   CO2 22 04/11/2023 0309   GLUCOSE 159 (H) 04/11/2023 0309   BUN 41 (H) 04/11/2023 0309   BUN 38 (H) 06/28/2022 0917   CREATININE 1.78 (H) 04/11/2023 0309   CALCIUM 8.3 (L) 04/11/2023 0309   CALCIUM IMSPUN 04/08/2023 0830   GFRNONAA 42 (L) 04/11/2023 0309   GFRAA  12/17/2006 0500    >60        The eGFR has been calculated using the MDRD equation. This calculation has not been validated in all clinical   CBC    Component Value Date/Time   WBC 5.4 04/11/2023 0309   RBC 3.18 (L) 04/11/2023 0309   HGB 9.8 (L) 04/11/2023 0309   HGB 15.4 08/18/2021 0855   HCT 33.3 (L) 04/11/2023 0309   HCT 45.8 08/18/2021 0855   PLT 258 04/11/2023 0309   PLT 512 (H) 08/18/2021 0855   MCV 104.7 (H) 04/11/2023 0309   MCV 98 (H) 08/18/2021 0855   MCH 30.8 04/11/2023 0309   MCHC 29.4 (L) 04/11/2023 0309   RDW 18.0 (H)  04/11/2023 0309   RDW 13.8 08/18/2021 0855   LYMPHSABS 1.0 04/11/2023 0309   LYMPHSABS 1.2 07/07/2021 0920   MONOABS 0.4 04/11/2023 0309  EOSABS 0.2 04/11/2023 0309   EOSABS 0.1 07/07/2021 0920   BASOSABS 0.1 04/11/2023 0309   BASOSABS 0.1 07/07/2021 0920    Assessment/ Plan: AKI - b/l creat 1.1- 1.3 from mid 2024. Pt was diuresed w/ IV lasix 40-80mg  for about 3-4 days, net neg 2 L overall. BP's were high then normal, then dropped ;yesterday prob due to sedation. Pt was hypercalcemic on presentation as well. UA unremarkable, renal US no obstruction. Suspect initial AKI due to hypercalcemia and/or underlying myeloma, 2nd rise may be from diuresis. Pt still looks dry. Creat peaked at 2.91 on 04/08/23 and now down to 1.78 w/ gentle IVF"s, will cont at 65/hr. F/u labs in am.  Hypernatremia - sodium stable at 142 today, currently off of D5 and LR.  F/u Na in am.  Hypercalcemia - getting calcitonin, and sp Zometa, per oncology. 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 Ca down to 9.2 from peak of 12.3.  Concern for myeloma or Waldenstrom's.   Oncology following.  Will need bone marrow biopsy.  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

## 2023-04-11 NOTE — Procedures (Signed)
 Vascular and Interventional Radiology Procedure Note  Patient: Ronnie Dunn DOB: [birth year] Medical Record Number: [id number] Note Date/Time: 04/11/23 9:46 AM   Performing Physician: Roanna Banning, MD Assistant(s): None  Diagnosis: Q myeloma   Procedure: BONE MARROW ASPIRATION and BIOPSY  Anesthesia: Conscious Sedation Complications: None Estimated Blood Loss: Minimal Specimens: Sent for Pathology  Findings:  Successful CT-guided bone marrow aspiration and biopsy A total of 2 cores were obtained. Hemostasis of the tract was achieved using Manual Pressure.  Plan: Bed rest for 1 hours.  See detailed procedure note with images in PACS. The patient tolerated the procedure well without incident or complication and was returned to Recovery in stable condition.    Roanna Banning, MD [facility] Radiology   Pager. [phone] Clinic. [phone]

## 2023-04-12 ENCOUNTER — Inpatient Hospital Stay (HOSPITAL_COMMUNITY)

## 2023-04-12 DIAGNOSIS — N179 Acute kidney failure, unspecified: Secondary | ICD-10-CM | POA: Diagnosis not present

## 2023-04-12 DIAGNOSIS — J9601 Acute respiratory failure with hypoxia: Secondary | ICD-10-CM | POA: Diagnosis not present

## 2023-04-12 DIAGNOSIS — G9341 Metabolic encephalopathy: Secondary | ICD-10-CM | POA: Diagnosis not present

## 2023-04-12 DIAGNOSIS — K7682 Hepatic encephalopathy: Secondary | ICD-10-CM | POA: Diagnosis not present

## 2023-04-12 LAB — UPEP/UIFE/LIGHT CHAINS/TP, 24-HR UR
% BETA, Urine: 12.7 %
ALPHA 1 URINE: 7 %
Albumin, U: 35.8 %
Alpha 2, Urine: 8.8 %
Free Kappa Lt Chains,Ur: 1083.33 mg/L — ABNORMAL HIGH (ref 1.17–86.46)
Free Kappa/Lambda Ratio: 11.79 (ref 1.83–14.26)
Free Lambda Lt Chains,Ur: 91.91 mg/L — ABNORMAL HIGH (ref 0.27–15.21)
GAMMA GLOBULIN URINE: 35.7 %
M-SPIKE %, Urine: 28.9 % — ABNORMAL HIGH
M-Spike, Mg/24 Hr: 1484 mg/(24.h) — ABNORMAL HIGH
Total Protein, Urine-Ur/day: 5136 mg/(24.h) — ABNORMAL HIGH (ref 30–150)
Total Protein, Urine: 395.1 mg/dL
Total Volume: 1300

## 2023-04-12 LAB — CULTURE, BLOOD (ROUTINE X 2)
Culture: NO GROWTH
Culture: NO GROWTH
Special Requests: ADEQUATE

## 2023-04-12 LAB — COMPREHENSIVE METABOLIC PANEL
ALT: 7 U/L (ref 0–44)
AST: 34 U/L (ref 15–41)
Albumin: 1.7 g/dL — ABNORMAL LOW (ref 3.5–5.0)
Alkaline Phosphatase: 125 U/L (ref 38–126)
Anion gap: 7 (ref 5–15)
BUN: 41 mg/dL — ABNORMAL HIGH (ref 8–23)
CO2: 21 mmol/L — ABNORMAL LOW (ref 22–32)
Calcium: 8.3 mg/dL — ABNORMAL LOW (ref 8.9–10.3)
Chloride: 121 mmol/L — ABNORMAL HIGH (ref 98–111)
Creatinine, Ser: 1.56 mg/dL — ABNORMAL HIGH (ref 0.61–1.24)
GFR, Estimated: 50 mL/min — ABNORMAL LOW (ref >60)
Glucose, Bld: 203 mg/dL — ABNORMAL HIGH (ref 70–99)
Potassium: 4.1 mmol/L (ref 3.5–5.1)
Sodium: 149 mmol/L — ABNORMAL HIGH (ref 135–145)
Total Bilirubin: 0.9 mg/dL (ref 0.0–1.2)
Total Protein: 9.7 g/dL — ABNORMAL HIGH (ref 6.5–8.1)

## 2023-04-12 LAB — CBC WITH DIFFERENTIAL/PLATELET
Abs Immature Granulocytes: 0.12 10*3/uL — ABNORMAL HIGH (ref 0.00–0.07)
Basophils Absolute: 0 10*3/uL (ref 0.0–0.1)
Basophils Relative: 1 %
Eosinophils Absolute: 0.1 10*3/uL (ref 0.0–0.5)
Eosinophils Relative: 2 %
HCT: 33.7 % — ABNORMAL LOW (ref 39.0–52.0)
Hemoglobin: 9.7 g/dL — ABNORMAL LOW (ref 13.0–17.0)
Immature Granulocytes: 2 %
Lymphocytes Relative: 16 %
Lymphs Abs: 0.9 10*3/uL (ref 0.7–4.0)
MCH: 30.3 pg (ref 26.0–34.0)
MCHC: 28.8 g/dL — ABNORMAL LOW (ref 30.0–36.0)
MCV: 105.3 fL — ABNORMAL HIGH (ref 80.0–100.0)
Monocytes Absolute: 0.5 10*3/uL (ref 0.1–1.0)
Monocytes Relative: 8 %
Neutro Abs: 4.1 10*3/uL (ref 1.7–7.7)
Neutrophils Relative %: 71 %
Platelets: 276 10*3/uL (ref 150–400)
RBC: 3.2 MIL/uL — ABNORMAL LOW (ref 4.22–5.81)
RDW: 18.4 % — ABNORMAL HIGH (ref 11.5–15.5)
WBC: 5.8 10*3/uL (ref 4.0–10.5)
nRBC: 0.5 % — ABNORMAL HIGH (ref 0.0–0.2)

## 2023-04-12 LAB — AMMONIA: Ammonia: 59 umol/L — ABNORMAL HIGH (ref 9–35)

## 2023-04-12 LAB — GLUCOSE, CAPILLARY
Glucose-Capillary: 212 mg/dL — ABNORMAL HIGH (ref 70–99)
Glucose-Capillary: 235 mg/dL — ABNORMAL HIGH (ref 70–99)
Glucose-Capillary: 148 mg/dL — ABNORMAL HIGH (ref 70–99)
Glucose-Capillary: 134 mg/dL — ABNORMAL HIGH (ref 70–99)
Glucose-Capillary: 145 mg/dL — ABNORMAL HIGH (ref 70–99)
Glucose-Capillary: 171 mg/dL — ABNORMAL HIGH (ref 70–99)
Glucose-Capillary: 194 mg/dL — ABNORMAL HIGH (ref 70–99)
Glucose-Capillary: 148 mg/dL — ABNORMAL HIGH (ref 70–99)

## 2023-04-12 LAB — LACTATE DEHYDROGENASE: LDH: 245 U/L — ABNORMAL HIGH (ref 98–192)

## 2023-04-12 LAB — MAGNESIUM: Magnesium: 1.9 mg/dL (ref 1.7–2.4)

## 2023-04-12 MED ORDER — METOPROLOL TARTRATE 12.5 MG HALF TABLET
12.5000 mg | ORAL_TABLET | Freq: Two times a day (BID) | ORAL | Status: DC
  Administered 2023-04-13 – 2023-04-15 (×4): 12.5 mg
  Filled 2023-04-12 (×4): qty 1

## 2023-04-12 MED ORDER — METOPROLOL TARTRATE 12.5 MG HALF TABLET
12.5000 mg | ORAL_TABLET | Freq: Two times a day (BID) | ORAL | Status: DC
  Administered 2023-04-12: 12.5 mg via ORAL
  Filled 2023-04-12: qty 1

## 2023-04-12 MED ORDER — HYDRALAZINE HCL 20 MG/ML IJ SOLN
20.0000 mg | Freq: Once | INTRAMUSCULAR | Status: AC
  Administered 2023-04-12: 20 mg via INTRAVENOUS
  Filled 2023-04-12: qty 1

## 2023-04-12 MED ORDER — INSULIN ASPART 100 UNIT/ML IJ SOLN: 0.0000 [IU] | INTRAMUSCULAR | Status: DC

## 2023-04-12 MED ORDER — QUETIAPINE FUMARATE 50 MG PO TABS: 50.0000 mg | ORAL_TABLET | Freq: Every day | ORAL | Status: DC

## 2023-04-12 MED ORDER — METOPROLOL TARTRATE 5 MG/5ML IV SOLN
2.5000 mg | INTRAVENOUS | Status: DC | PRN
  Administered 2023-04-12: 2.5 mg via INTRAVENOUS
  Filled 2023-04-12 (×2): qty 5

## 2023-04-12 MED ORDER — SODIUM CHLORIDE 0.45 % IV SOLN: INTRAVENOUS | Status: DC

## 2023-04-12 MED ORDER — FREE WATER
200.0000 mL | Status: DC
  Administered 2023-04-12: 200 mL

## 2023-04-12 MED ORDER — METOPROLOL TARTRATE 5 MG/5ML IV SOLN
5.0000 mg | Freq: Once | INTRAVENOUS | Status: AC
  Administered 2023-04-12: 5 mg via INTRAVENOUS
  Filled 2023-04-12: qty 5

## 2023-04-12 MED ORDER — INSULIN ASPART 100 UNIT/ML IJ SOLN
0.0000 [IU] | INTRAMUSCULAR | Status: DC
Start: 2023-04-12 — End: 2023-04-17
  Administered 2023-04-12: 2 [IU] via SUBCUTANEOUS
  Administered 2023-04-12 – 2023-04-13 (×3): 1 [IU] via SUBCUTANEOUS
  Administered 2023-04-13 (×2): 3 [IU] via SUBCUTANEOUS
  Administered 2023-04-13: 1 [IU] via SUBCUTANEOUS
  Administered 2023-04-14: 3 [IU] via SUBCUTANEOUS
  Administered 2023-04-14: 1 [IU] via SUBCUTANEOUS
  Administered 2023-04-14: 2 [IU] via SUBCUTANEOUS
  Administered 2023-04-14: 3 [IU] via SUBCUTANEOUS
  Administered 2023-04-15: 1 [IU] via SUBCUTANEOUS
  Administered 2023-04-15 (×3): 2 [IU] via SUBCUTANEOUS
  Administered 2023-04-15 – 2023-04-16 (×2): 3 [IU] via SUBCUTANEOUS
  Administered 2023-04-16 (×5): 5 [IU] via SUBCUTANEOUS
  Administered 2023-04-16: 2 [IU] via SUBCUTANEOUS
  Administered 2023-04-17: 5 [IU] via SUBCUTANEOUS
  Administered 2023-04-17: 3 [IU] via SUBCUTANEOUS

## 2023-04-12 MED ORDER — ORAL CARE MOUTH RINSE: 15.0000 mL | OROMUCOSAL | Status: DC | PRN

## 2023-04-12 MED ORDER — AMLODIPINE BESYLATE 5 MG PO TABS: 5.0000 mg | ORAL_TABLET | Freq: Every day | ORAL | Status: DC

## 2023-04-12 MED ORDER — OXYCODONE HCL 5 MG PO TABS
5.0000 mg | ORAL_TABLET | Freq: Four times a day (QID) | ORAL | Status: DC | PRN
  Filled 2023-04-12: qty 1

## 2023-04-12 MED ORDER — OXYCODONE HCL 5 MG PO TABS
5.0000 mg | ORAL_TABLET | Freq: Four times a day (QID) | ORAL | Status: AC | PRN
  Administered 2023-04-12 (×2): 5 mg
  Filled 2023-04-12: qty 1

## 2023-04-12 MED ORDER — AMLODIPINE BESYLATE 5 MG PO TABS
5.0000 mg | ORAL_TABLET | Freq: Every day | ORAL | Status: DC
  Administered 2023-04-12: 5 mg via ORAL
  Filled 2023-04-12: qty 1

## 2023-04-12 MED ORDER — DEXMEDETOMIDINE HCL IN NACL 400 MCG/100ML IV SOLN
0.0000 ug/kg/h | INTRAVENOUS | Status: DC
  Administered 2023-04-12: 1.4 ug/kg/h via INTRAVENOUS
  Administered 2023-04-12: 0.7 ug/kg/h via INTRAVENOUS
  Administered 2023-04-12: 1.4 ug/kg/h via INTRAVENOUS
  Administered 2023-04-12: 1.3 ug/kg/h via INTRAVENOUS
  Administered 2023-04-12: 0.4 ug/kg/h via INTRAVENOUS
  Administered 2023-04-13: 0.8 ug/kg/h via INTRAVENOUS
  Administered 2023-04-13 (×2): 0.9 ug/kg/h via INTRAVENOUS
  Administered 2023-04-14: 0.4 ug/kg/h via INTRAVENOUS
  Administered 2023-04-14: 0.1 ug/kg/h via INTRAVENOUS
  Filled 2023-04-12 (×8): qty 100

## 2023-04-12 MED ORDER — ORAL CARE MOUTH RINSE
15.0000 mL | OROMUCOSAL | Status: DC
  Administered 2023-04-12 – 2023-04-13 (×6): 15 mL via OROMUCOSAL

## 2023-04-12 NOTE — Inpatient Diabetes Management (Signed)
 Inpatient Diabetes Program Recommendations  AACE/ADA: New Consensus Statement on Inpatient Glycemic Control   Target Ranges:  Prepandial:   less than 140 mg/dL      Peak postprandial:   less than 180 mg/dL (1-2 hours)      Critically ill patients:  140 - 180 mg/dL    Latest Reference Range & Units 04/11/23 07:46 04/11/23 11:40 04/11/23 17:14 04/11/23 19:29 04/11/23 23:32 04/12/23 03:46 04/12/23 07:44  Glucose-Capillary 70 - 99 mg/dL 161 (H) 096 (H) 045 (H) 181 (H) 181 (H) 212 (H) 235 (H)   Review of Glycemic Control  Diabetes history: DM2 Outpatient Diabetes medications: Farxiga 10 mg daily Current orders for Inpatient glycemic control: None; Osmolite @ 60 ml/hr  Inpatient Diabetes Program Recommendations:    Insulin: CBGs today 212 and 235 mg/dl.  Please consider ordering Novolog 0-9 units Q4H.   Thanks, Orlando Penner, RN, MSN, CDCES Diabetes Coordinator Inpatient Diabetes Program [phone] (Team Pager from 8am to 5pm)

## 2023-04-12 NOTE — Progress Notes (Signed)
 OT Cancellation Note  Patient Details Name: ROMOND PIPKINS MRN: [id number] DOB: [birth year]   Cancelled Treatment:    Reason Eval/Treat Not Completed: Medical issues which prohibited therapy Per RN, hold therapy today. OT to continue to follow and check back as schedule will allow on 3/27.  Rosalio Loud, MS Acute Rehabilitation Department Office# [phone]  04/12/2023, 9:40 AM

## 2023-04-12 NOTE — Progress Notes (Signed)
 PT Cancellation Note  Patient Details Name: Ronnie Dunn MRN: [id number] DOB: 10-31-1960   Cancelled Treatment:    Reason Eval/Treat Not Completed: Medical issues which prohibited therapy Per RN, hold PT today.  Blanchard Kelch PT Acute Rehabilitation Services Office [phone]    [name] 04/12/2023, 9:25 AM

## 2023-04-12 NOTE — Progress Notes (Signed)
 NAME:  Ronnie Dunn, MRN:  [id number], DOB:  12-21-1960, LOS: 9 ADMISSION DATE:  04/03/2023, CONSULTATION DATE:  04/07/23 REFERRING MD:  Jerolyn Center CHIEF COMPLAINT:  AMS   BRIEF  Ronnie Dunn is a 63 y.o. male who has a PMH as below including but not limited to COPD, OSA on CPAP, chronic diastolic CHF, HTN, HLD, PAF on Xarelto, tobacco dependence, alcohol use.  He is followed by [name] as an outpatient. He presented to [facility] ED on 3/17 with cough, fever/chills, nausea, weakness, fatigue, dyspnea x 2 weeks.  He had some confusion on and off and decreased appetite.  He had also complained of low back pain which was new and was not associated with any recent fall or trauma.  He had productive sputum, green in color.  He recently completed a course of azithromycin as an outpatient but had no relief with this.  Due to ongoing symptoms, he presented to the ED for further workup.  Initial chest x-ray showed bilateral interstitial opacities.  He was started on IV Lasix.  Renal function worsened, he had renal ultrasound on 3/19 that showed normal kidneys.  Follow-up chest x-ray showed cardiomegaly and ongoing interstitial opacities with small pleural effusions.  Diuresis was continued; however, renal function worsened further.  Was initially on vancomycin and cefepime however cefepime was stopped due to altered mental status.  This was switched to ceftriaxone on 3/19.  Overnight 3/20, he developed A-fib RVR.  He received Lopressor with improvement in heart rate.  Early a.m. 3/21 he had altered mental status and new fever to 102.9 rectally.  He was subsequently transferred to the ICU and PCCM and infectious disease were asked to see in consultation.  Upon arrival to the ICU, he is minimally responsive but is protecting his airway.  He has heart rates in the 110s to 120s, blood pressure stable.  Mucous membranes are very dry.  ABG was obtained and was normal (7.52/41/21). He has clear urine in his Foley.   Antibiotics were escalated from ceftriaxone to Zosyn.  Infectious disease has seen him and we have agreed to assess CT chest/abdomen/pelvis.  Pertinent  Medical History:  has Seizure (HCC); Hyponatremia; Alcohol use disorder, moderate, dependence (HCC); Class 1 obesity; Essential hypertension; Type 2 diabetes mellitus without complication, without long-term current use of insulin (HCC); Chronic obstructive pulmonary disease (HCC); Gastroesophageal reflux disease; Lower extremity edema; Permanent atrial fibrillation (HCC); OSA (obstructive sleep apnea); Chronic heart failure with preserved ejection fraction (HCC); Seizures (HCC); Protein-calorie malnutrition, moderate (HCC); Hypomagnesemia; Alcohol abuse; Obesity (BMI 30-39.9); Noncompliance with medication regimen; Aortic root dilation (HCC); Acquired thrombophilia (HCC); Pneumonia due to infectious organism; AKI (acute kidney injury) (HCC); Acute metabolic encephalopathy; Encephalopathy; Hypokalemia; and Acute respiratory failure with hypoxia (HCC) on their problem list.  Significant Hospital Events: Including procedures, antibiotic start and stop dates in addition to other pertinent events   3/17 admit 3.19 - BNPP 800 3/20 - HFNC O2 need 3/21 - Altered but protecting airway. Vitals stable.Marland Kitchen 7L [place] O2 need. BN 275 (down from 813)  - ID Consult: Fever, sepsis of unknown source--potentially due to aspiration in context of encephalopathy. Alcoholic liver disease. 3/AKI. 4Encephalopathy --presumably from alcohol withdrawal, hepatic encaphalopathy though no cirrhosis on CT at [facility] in 2019   3/22 - HEme conslt - cocern for myleoma v Waldenstrom ( .  His total protein is quite high.  His calcium is quite high.  His albumin is low.) Getting seruum viscolity due to Acue encephalpthy. Pan CT   -  unremarlkable. ID thinks low odds for infection. ID recommending MRI. RN/Wife report Obutndatioin with restlessness - on prn ativan. Last drink > 2 weeks go . Wife  describes 2022 admit for Low NA - and seziure like activity but no EEG or neuro conslt in record.  Started Precedex.  Antibiotics all stopped by ID  3/23 cerebral EEG without any seizures but showing encephalopathy.  MRI brain could not be done because patient desaturated while supine. Per RN "His blood is too thick to obtain a serum protein, parathyroid, viscosity. " Ammonia 65.  3/25 bone marrow biopsy   Interim History / Subjective:   Bradycardic on Precedex to 30s, turned off Afebrile On 6 L nasal cannula    Objective:  Blood pressure (!) 163/58, pulse 80, temperature 98.7 F (37.1 C), temperature source Axillary, resp. rate 19, height 6\' 2"  (1.88 m), weight 105.5 kg, SpO2 95%.        Intake/Output Summary (Last 24 hours) at 04/12/2023 0941 Last data filed at 04/12/2023 1610 Gross per 24 hour  Intake 2261.18 ml  Output 1500 ml  Net 761.18 ml   Filed Weights   04/09/23 0406 04/10/23 0600 04/12/23 0500  Weight: 106.2 kg 108.2 kg 105.5 kg    Examination: General Appearance: Chronically ill-appearing, OBESE - + Head:  Normocephalic, without obvious abnormality, atraumatic Eyes: Mild pallor, no icterus Nose:  G tube - yes Neck:  Supple,  No lymphadenopathy Lungs: No accessory muscle use, clear breath sounds bilateral Heart:  S1 and S2 normal, no murmur, Abdomen:  Soft, no masses, no organomegaly Extremities: No deformity, no edema Skin:  ntact in exposed areas . Sacral area - not examined Neurologic: RASS-2, does not follow commands   Labs show hyperpneic EMEA 149, BUN/creatinine improving 41/1.5, albumin 1.7, high sugars, no leukocytosis, stable anemia   Labs/imaging personally reviewed:  CT head 3/19 > neg. Renal US 3/19 > neg. CT chest/abd/pelv 3/21 > infrarenal AAA Cerebral EEG 04/08/2023: No seizures. CT chest without con 3/23 collapse consolidation left lower lobe , scattered lytic lesions  Assessment & Plan:  HX OF ETOH Acute metabolic encephalopathy -  multifactorial 2/2 hyperammonemia, EtOH, presumed early developing sepsis of unclear etiology = . CT head neg, TSH, ABG normal. Folate elevated, B12 pending. - EEG 3/22 & 3/25 No seizures but shows encephalopathy. Unable to do MRI because of desaturations  More history obtained from wife -he was a heavy drinker has not consumed alcohol for 1 week prior to admission, heavy smoker  PLAN - MRI brain wo contrast (has low GFR) -> when possible and more stable  -dc PRECEDEX, goal RASS 0 - avoid cephalosporins - PRN Haldol, minimize Ativan, if QTc okay, will add Seroquel -Nicotine patch -Continue lactulose, titrate to 2-3 soft bowel movements per day  Concern for sepsis y - some suspicion for early aspiration PNA.  Left lower lobe collapse/consolidation oral antibiotic stopped 04/08/2023 infectious diseases  04/09/2023: Off antibiotics.  ID consult does not think patient has infectious problem  Plan - Monitor off antibiotics -Incentive spirometry   Acute hypoxic respiratory failure - 2/2 above + baseline COPD (though not on O2), OSA. -Baseline 2 L nasal cannula at night Hx COPD, OSA, tobacco dependence.  3/22 and 3/23  - protecing airway but at risk for decompensation.  Needing 6 L oxygen off for several days..  Encephalopathy prevents BiPAP or CPAP  PLNA - Continue supplemental O2 as needed to maintain SpO2 > 92%. -triple therapy nebs in lieu of PTA Trelegy. -Smoking cessation  counseling at discharge  AKI.-Initially due to hypercalcemia, improved and then worsened again likely due to diuresis  PLAN Per renal , proving with gentle hydration   Hypercalcemia Hypokalemia High-protein,Hypoalbiuminema Hyperviscosity - lab unable to sample many test. 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 Seen by Dr Myna Hidalgo 04/08/23 -High kappa and lambda free light chains on SPEP  Plan  - - calcitonin per heme -Bone marrow biopsy 3/25   Hx dCHF, A.fib on Xarelto,  HTN, HLD.  -Echo 04/08/2023: Normal left ventricle ejection fraction but right ventricular dysfunction present and is new compared to July 2024. Currently on schedled lopressr and hydralazine.  Having bradycardia with Precedex.  PLAN  -Continue hydralazine scheduled - Continue Xarelto.    Best practice (evaluated daily):  Diet/type: NPO -RD consultation for tube feeds DVT prophylaxis: DOAC  Pressure ulcer(s): pressure ulcer assessment deferred  GI prophylaxis: Pepcid Lines: N/A Foley:  N/A Code Status:  full code Last date of multidisciplinary goals of care discussion: None yet.  Updated wife at bedside   ATTESTATION & SIGNATURE    Cyril Mourning MD. [facility]. [facility] Pager : [phone]  If no response to pager , please call [phone] until 7 pm After 7:00 pm call Elink  [phone]    04/12/2023 9:41 AM     LABS    PULMONARY Recent Labs  Lab 04/06/23 1633 04/07/23 1026  PHART  --  7.52*  PCO2ART  --  41  PO2ART  --  101  HCO3 32.8* 33.1*  O2SAT 91.9 98.5    CBC Recent Labs  Lab 04/08/23 0830 04/11/23 0309 04/12/23 0308  HGB 12.2* 9.8* 9.7*  HCT 39.2 33.3* 33.7*  WBC 8.9 5.4 5.8  PLT 289 258 276    COAGULATION No results for input(s): "INR" in the last 168 hours.   CARDIAC  No results for input(s): "TROPONINI" in the last 168 hours. No results for input(s): "PROBNP" in the last 168 hours.   CHEMISTRY Recent Labs  Lab 04/08/23 0830 04/09/23 0555 04/09/23 1332 04/09/23 1619 04/10/23 0536 04/10/23 0537 04/11/23 0309 04/12/23 0308  NA 146* 146*  --   --  145  --  142 149*  K 2.7* 3.1*  --   --  3.1*  --  3.3* 4.1  CL 108 111  --   --  114*  --  116* 121*  CO2 26 25  --   --  24  --  22 21*  GLUCOSE 123* 139*  --   --  196*  --  159* 203*  BUN 58* 60*  --   --  51*  --  41* 41*  CREATININE 2.91* 2.40*  --   --  1.96*  --  1.78* 1.56*  CALCIUM 11.0*  IMSPUN 9.7  --   --  9.2  --  8.3* 8.3*  MG  --   --  1.8  1.6*  --  2.1 1.8 1.9  PHOS 2.5 3.1 2.8 2.5 2.0* 1.9* 2.7  --    Estimated Creatinine Clearance: 62.7 mL/min (A) (by C-G formula based on SCr of 1.56 mg/dL (H)).   LIVER Recent Labs  Lab 04/06/23 0948 04/07/23 0508 04/08/23 0830 04/09/23 0555 04/10/23 0536 04/11/23 0309 04/12/23 0308  AST 27 26 22   --   --   --  34  ALT 5 9 7   --   --   --  7  ALKPHOS 70 72 77  --   --   --  125  BILITOT 0.8 1.1 1.0  --   --   --  0.9  PROT 11.1* 11.2* 10.8*  --   --   --  9.7*  ALBUMIN 2.0* 2.2* 1.8* 1.7* 1.6* 1.8* 1.7*     INFECTIOUS Recent Labs  Lab 04/07/23 1315 04/07/23 1552 04/08/23 2359 04/09/23 0555  LATICACIDVEN 2.0* 2.4* 1.3 1.2  PROCALCITON 1.34  --   --   --      ENDOCRINE CBG (last 3)  Recent Labs    04/11/23 2332 04/12/23 0346 04/12/23 0744  GLUCAP 181* 212* 235*

## 2023-04-12 NOTE — Progress Notes (Addendum)
 PROGRESS NOTE  Ronnie Dunn  ZOX:[id number] DOB: Jun 02, 1960 DOA: 04/03/2023 PCP: [name], MD  Consultants  Brief Narrative: 63 yo male  with past medical history significant for COPD, chronic diastolic congestive heart failure, HTN, HLD, paroxysmal atrial fibrillation on anticoagulation with Xarelto, GERD, OSA, tobacco use disorder, alcohol use disorder, obesity who presented to [facility] ED from home via EMS on 04/03/2022 with complaints of productive cough, fever/chills, nausea, progressive weakness/fatigue and shortness of breath over the last 2 weeks.  Spouse also noted that he was confused, not wanting to eat or drink anything.  Also reported to be hypothermic with temperature 94.0 F.  Also has had complaints of low back pain for past two weeks which is new,no falls reported  Recently completed a course of azithromycin prescribed by his pulmonologist, [name] which did not improve his symptoms.  Given progression, confusion spouse called EMS and patient was brought to the ED for further evaluation management.  Admitted for same.  Patient was transferred to stepdown on 3/21 due to respiratory distress   CT chest abdomen and pelvis shows small bilateral pleural effusion, small pericardial effusion, infrarenal abdominal aortic aneurysm 4.2 x 3.5 cm recommending follow-up CT or MRI in 12 months, CAD.   Assessment & Plan:   Principal Problem:   Encephalopathy Active Problems:   Pneumonia due to infectious organism   AKI (acute kidney injury) (HCC)   Acute metabolic encephalopathy   Hypokalemia   Metabolic encephalopathy - :Now main issue leading to continued hospitalization.  - working diagnoses is most likely lymphoproliferative d/o explaining his multitude of symptoms, including mental status changes and persistent ammonia.   - does have history of EtOH/hyponatremic sz in past.  EEG good here. Notes to this point all point towards MRI -- attempt made several days  ago but he desaturated when laid flat.  CT head normal.  Ammonia remains elevated.  On lactulose Cerebel eeg moderate encephalopathy no seizure activity is noted   Paroxysmal A-fib/A-fib RVR  has history of atrial fibrillation on Xarelto, beta-blockers were stopped as patient became bradycardic after starting Precedex. - Precedex stopped this AM due to bradycardia - Patient became much more agitated this AM, yelling out, picking at his mitts - HR and BP both also escalated after stopping precedex.  - Added metoprolol IV and po to regimen today in light of ongoing elevated BP.   FUO resolved, now afebrile -patient was treated with vancomycin cefepime and Rocephin etc. initially.  ID was consulted.  COVID RSV and influenza were negative  He has not had any fever spikes in the last 72 hours. White count has normalized lactic acidosis has resolved.  Patient was transferred to stepdown on 3/21 due to respiratory distress  PCCM started zosyn and ID was consulted  CT chest abdomen and pelvis did not reveal any evidence of acute infection. Antibiotics were stopped 3/22   Rule out multiple myeloma/Waldenstrm's macroglobulinemia -patient presented with hypercalcemia, renal insufficiency, anemia and back pain.  Appreciate oncology input.  SPEP UPEP pending  Kappa lambda chains ordered Apparently lab cannot process some of these labs  His CT of the chest from 04/09/2023 shows third lytic lesions worrisome for multiple myeloma Dr. Myna Hidalgo following. Status post bone marrow biopsy on 04/11/2023 by interventional radiology    Hypernatremia   - appreciate nephrology input.  - s/p free water today.  - presume ongoing insensible losses with this AM's agitation.   - will restart 1/2 NS IVF  Hypokalemia  Better today.  Follow    COPD Not oxygen dependent at baseline.  Follows with pulmonology outpatient [name]. On Trelegy Ellipta at baseline   Low back pain for the last 2 weeks per wife MRI  lumbar spine -lumbar spondylosis, moderate bilateral neuroforaminal stenosis at L5-S1, mild spinal canal stenosis, moderate right lateral recess stenosis at L4-L5 - doesn't appear to be playing into his metabolic encephalopathy    Chronic diastolic congestive heart failure, decompensated Essential hypertension Home regimen includes metoprolol succinate 50 mg p.o. daily, hydralazine 50 mg p.o. 3 times daily, spironolactone 25 mg p.o. daily, lisinopril 5 mg p.o. daily, furosemide 120 mg p.o. daily.   All this meds have been on hold except hydralazine - started back on beta blocker to help with heart rate (a fib/RVR) plus hypertension.  - watch for signs of fluid overload in light of CHF dx and restarting fluids, although he is notably fluid down now.     GERD -- Protonix . daily   HLD -- Atorvastatin. daily   Tobacco use disorder EtOH use disorder Counsel need for complete cessation/abstinence. --Nicotine patch   Obesity, class I Body mass index is 31.53 kg/m.     Moderate hypercalcemia -resolved.  Status post Zometa  Calcitonin unclear etiology ?malignancy?myeloma-phosphorus 2.8, vitamin D 73.8 Await PTH SPEP UPEP CT chest suspicious for multiple myeloma with scattered lytic lesions small pericardial effusion similar left lobe collapse consolidation, pulmonary artery hypertension.   AKI  - creatinine improvement with slow IV fluids.  ultrasound of the kidneys showed no hydronephrosis.   - creatinine continues downward trend - nephrology has signed off.  Restarting fluids as above.   Nutrition Problem: Inadequate oral intake Etiology: inability to eat Signs/Symptoms: NPO status Interventions: Refer to RD note for recommendations Pressure Injury 04/09/23 Back Right;Mid Deep Tissue Pressure Injury - Purple or maroon localized area of discolored intact skin or blood-filled blister due to damage of underlying soft tissue from pressure and/or shear. 4cm X 2cm purple wound (Active)   04/09/23 1039  Location: Back  Location Orientation: Right;Mid  Staging: Deep Tissue Pressure Injury - Purple or maroon localized area of discolored intact skin or blood-filled blister due to damage of underlying soft tissue from pressure and/or shear.  Wound Description (Comments): 4cm X 2cm purple wound  Present on Admission: No  Dressing Type Foam - Lift dressing to assess site every shift 04/10/23 1133   DVT prophylaxis:   rivaroxaban (XARELTO) tablet 20 mg  Code Status:   Code Status: Full Code Family Communication: wife at bedside Level of care: Stepdown Status is: Inpatient   Consults called: PCCM, nephrology   Subjective: Patient awake and yelling out, appears agitated on my exam this AM.  Wife at bedside, patient mostly yelling and cursing at her.  Could not answer any questions, words were nonsensical   Objective: Vitals:   04/12/23 1100 04/12/23 1200 04/12/23 1300 04/12/23 1400  BP: (!) 210/179  (!) 204/116 (!) 218/100  Pulse: 72 (!) 119 (!) 118 76  Resp: 19 (!) 23 (!) 22 19  Temp:      TempSrc:      SpO2: 94% 92% 93% 93%  Weight:      Height:        Intake/Output Summary (Last 24 hours) at 04/12/2023 1513 Last data filed at 04/12/2023 1400 Gross per 24 hour  Intake 2266.41 ml  Output 2300 ml  Net -33.59 ml   Filed Weights   04/09/23 0406 04/10/23 0600 04/12/23 0500  Weight: 106.2 kg 108.2 kg 105.5 kg   Body mass index is 29.86 kg/m.  Gen: 63 y.o. male lying in bed, yelling out.  Flushed appearing Pulm: RR minimally elevated CV: Tachycardic, heart sounded regular rhythm on auscultation GI: Abdomen soft, nondistended, no obvious tenderness.  GU and rectal foley in place  Ext: Warm, no deformities,  Skin: No rashes, lesions  Neuro:  not oriented, moving all arms and legs symmetrically  Psych: Agitated, yelling out.     I have personally reviewed the following labs and images: CBC: Recent Labs  Lab 04/07/23 1251 04/08/23 0830 04/11/23 0309  04/12/23 0308  WBC 9.8 8.9 5.4 5.8  NEUTROABS  --   --  3.7 4.1  HGB 13.1 12.2* 9.8* 9.7*  HCT 43.4 39.2 33.3* 33.7*  MCV 100.7* 99.2 104.7* 105.3*  PLT 363 289 258 276   BMP &GFR Recent Labs  Lab 04/08/23 0830 04/09/23 0555 04/09/23 1332 04/09/23 1619 04/10/23 0536 04/10/23 0537 04/11/23 0309 04/12/23 0308  NA 146* 146*  --   --  145  --  142 149*  K 2.7* 3.1*  --   --  3.1*  --  3.3* 4.1  CL 108 111  --   --  114*  --  116* 121*  CO2 26 25  --   --  24  --  22 21*  GLUCOSE 123* 139*  --   --  196*  --  159* 203*  BUN 58* 60*  --   --  51*  --  41* 41*  CREATININE 2.91* 2.40*  --   --  1.96*  --  1.78* 1.56*  CALCIUM 11.0*  IMSPUN 9.7  --   --  9.2  --  8.3* 8.3*  MG  --   --  1.8 1.6*  --  2.1 1.8 1.9  PHOS 2.5 3.1 2.8 2.5 2.0* 1.9* 2.7  --    Estimated Creatinine Clearance: 62.7 mL/min (A) (by C-G formula based on SCr of 1.56 mg/dL (H)). Liver & Pancreas: Recent Labs  Lab 04/06/23 0948 04/07/23 0508 04/08/23 0830 04/09/23 0555 04/10/23 0536 04/11/23 0309 04/12/23 0308  AST 27 26 22   --   --   --  34  ALT 5 9 7   --   --   --  7  ALKPHOS 70 72 77  --   --   --  125  BILITOT 0.8 1.1 1.0  --   --   --  0.9  PROT 11.1* 11.2* 10.8*  --   --   --  9.7*  ALBUMIN 2.0* 2.2* 1.8* 1.7* 1.6* 1.8* 1.7*   No results for input(s): "LIPASE", "AMYLASE" in the last 168 hours. Recent Labs  Lab 04/08/23 1852 04/09/23 0555 04/10/23 1401 04/11/23 0309 04/12/23 0308  AMMONIA 61* 63* 48* 66* 59*   Diabetic: No results for input(s): "HGBA1C" in the last 72 hours. Recent Labs  Lab 04/11/23 2332 04/12/23 0346 04/12/23 0744 04/12/23 1144 04/12/23 1423  GLUCAP 181* 212* 235* 148* 134*   Cardiac Enzymes: No results for input(s): "CKTOTAL", "CKMB", "CKMBINDEX", "TROPONINI" in the last 168 hours. No results for input(s): "PROBNP" in the last 8760 hours. Coagulation Profile: No results for input(s): "INR", "PROTIME" in the last 168 hours. Thyroid Function Tests: No  results for input(s): "TSH", "T4TOTAL", "FREET4", "T3FREE", "THYROIDAB" in the last 72 hours. Lipid Profile: Recent Labs    04/10/23 1740  CHOL 76  HDL 15*  LDLCALC 32  TRIG 161  CHOLHDL 5.1   Anemia Panel: No results for input(s): "VITAMINB12", "FOLATE", "FERRITIN", "TIBC", "IRON", "RETICCTPCT" in the last 72 hours. Urine analysis:    Component Value Date/Time   COLORURINE YELLOW 04/05/2023 1042   APPEARANCEUR CLEAR 04/05/2023 1042   LABSPEC 1.014 04/05/2023 1042   PHURINE 7.0 04/05/2023 1042   GLUCOSEU 50 (A) 04/05/2023 1042   HGBUR MODERATE (A) 04/05/2023 1042   BILIRUBINUR NEGATIVE 04/05/2023 1042   KETONESUR NEGATIVE 04/05/2023 1042   PROTEINUR 100 (A) 04/05/2023 1042   UROBILINOGEN 0.2 12/15/2006 1712   NITRITE NEGATIVE 04/05/2023 1042   LEUKOCYTESUR NEGATIVE 04/05/2023 1042   Sepsis Labs: Invalid input(s): "PROCALCITONIN", "LACTICIDVEN"  Microbiology: Recent Results (from the past 240 hours)  Resp panel by RT-PCR (RSV, Flu A&B, Covid) Anterior Nasal Swab     Status: None   Collection Time: 04/03/23 11:18 AM   Specimen: Anterior Nasal Swab  Result Value Ref Range Status   SARS Coronavirus 2 by RT PCR NEGATIVE NEGATIVE Final    Comment: (NOTE) SARS-CoV-2 target nucleic acids are NOT DETECTED.  The SARS-CoV-2 RNA is generally detectable in upper respiratory specimens during the acute phase of infection. The lowest concentration of SARS-CoV-2 viral copies this assay can detect is 138 copies/mL. A negative result does not preclude SARS-Cov-2 infection and should not be used as the sole basis for treatment or other patient management decisions. A negative result may occur with  improper specimen collection/handling, submission of specimen other than nasopharyngeal swab, presence of viral mutation(s) within the areas targeted by this assay, and inadequate number of viral copies(<138 copies/mL). A negative result must be combined with clinical observations, patient  history, and epidemiological information. The expected result is Negative.  Fact Sheet for Patients:  BloggerCourse.com  Fact Sheet for Healthcare Providers:  SeriousBroker.it  This test is no t yet approved or cleared by the [place] FDA and  has been authorized for detection and/or diagnosis of SARS-CoV-2 by FDA under an Emergency Use Authorization (EUA). This EUA will remain  in effect (meaning this test can be used) for the duration of the COVID-19 declaration under Section 564(b)(1) of the Act, 21 U.S.C.section 360bbb-3(b)(1), unless the authorization is terminated  or revoked sooner.       Influenza A by PCR NEGATIVE NEGATIVE Final   Influenza B by PCR NEGATIVE NEGATIVE Final    Comment: (NOTE) The Xpert Xpress SARS-CoV-2/FLU/RSV plus assay is intended as an aid in the diagnosis of influenza from Nasopharyngeal swab specimens and should not be used as a sole basis for treatment. Nasal washings and aspirates are unacceptable for Xpert Xpress SARS-CoV-2/FLU/RSV testing.  Fact Sheet for Patients: BloggerCourse.com  Fact Sheet for Healthcare Providers: SeriousBroker.it  This test is not yet approved or cleared by the [place] FDA and has been authorized for detection and/or diagnosis of SARS-CoV-2 by FDA under an Emergency Use Authorization (EUA). This EUA will remain in effect (meaning this test can be used) for the duration of the COVID-19 declaration under Section 564(b)(1) of the Act, 21 U.S.C. section 360bbb-3(b)(1), unless the authorization is terminated or revoked.     Resp Syncytial Virus by PCR NEGATIVE NEGATIVE Final    Comment: (NOTE) Fact Sheet for Patients: BloggerCourse.com  Fact Sheet for Healthcare Providers: SeriousBroker.it  This test is not yet approved or cleared by the [place] FDA  and has been authorized for detection and/or diagnosis of SARS-CoV-2 by FDA under an Emergency Use Authorization (EUA). This EUA will remain in effect (meaning this test can  be used) for the duration of the COVID-19 declaration under Section 564(b)(1) of the Act, 21 U.S.C. section 360bbb-3(b)(1), unless the authorization is terminated or revoked.  Performed at [facility], [address] [place], [place] [address]   Blood Culture (routine x 2)     Status: None   Collection Time: 04/03/23 11:18 AM   Specimen: BLOOD  Result Value Ref Range Status   Specimen Description   Final    BLOOD LEFT ANTECUBITAL Performed at [facility], [address], [place], [place] [address]    Special Requests   Final    BOTTLES DRAWN AEROBIC AND ANAEROBIC Blood Culture adequate volume Performed at [facility], [address], [place], [place] [address]    Culture   Final    NO GROWTH 5 DAYS Performed at [facility], [address], [place], [place] [address]    Report Status 04/08/2023 FINAL  Final  Blood Culture (routine x 2)     Status: None   Collection Time: 04/03/23 11:18 AM   Specimen: BLOOD RIGHT WRIST  Result Value Ref Range Status   Specimen Description   Final    BLOOD RIGHT WRIST Performed at [facility], [address] [place], [place] [address]    Special Requests   Final    BOTTLES DRAWN AEROBIC AND ANAEROBIC Blood Culture adequate volume Performed at [facility], [address], [place], [place] [address]    Culture   Final    NO GROWTH 5 DAYS Performed at [facility], [address], [place], [place] [address]    Report Status 04/08/2023 FINAL  Final  MRSA Next Gen by PCR, Nasal     Status: None   Collection Time: 04/03/23  5:17 PM   Specimen: Nasal Mucosa; Nasal Swab  Result Value Ref Range Status   MRSA by PCR Next Gen NOT DETECTED NOT DETECTED Final    Comment:  (NOTE) The GeneXpert MRSA Assay (FDA approved for NASAL specimens only), is one component of a comprehensive MRSA colonization surveillance program. It is not intended to diagnose MRSA infection nor to guide or monitor treatment for MRSA infections. Test performance is not FDA approved in patients less than 76 years old. Performed at [facility], [address], [place], [place] [address]   Culture, blood (Routine X 2) w Reflex to ID Panel     Status: None   Collection Time: 04/07/23  1:15 PM   Specimen: BLOOD LEFT HAND  Result Value Ref Range Status   Specimen Description   Final    BLOOD LEFT HAND Performed at [facility], [address] [place], [place] [address]    Special Requests   Final    BOTTLES DRAWN AEROBIC ONLY Blood Culture adequate volume Performed at [facility], [address], [place], [place] [address]    Culture   Final    NO GROWTH 5 DAYS Performed at [facility], [address], [place], [place] [address]    Report Status 04/12/2023 FINAL  Final  Culture, blood (Routine X 2) w Reflex to ID Panel     Status: None   Collection Time: 04/07/23  2:08 PM   Specimen: BLOOD RIGHT ARM  Result Value Ref Range Status   Specimen Description   Final    BLOOD RIGHT ARM Performed at [facility], [address] [place], [place]  [address]    Special Requests   Final    BOTTLES DRAWN AEROBIC AND ANAEROBIC Blood Culture results may not be optimal due to an inadequate volume of blood received in culture bottles Performed at [facility], [address] [place], [place] [address]    Culture   Final    NO GROWTH 5 DAYS Performed at [facility], [address], [place], [place] [address]    Report Status 04/12/2023 FINAL  Final  MRSA Next Gen by PCR, Nasal     Status: None   Collection Time: 04/10/23  8:22 AM   Specimen: Nasal Mucosa; Nasal Swab  Result Value Ref Range Status   MRSA  by PCR Next Gen NOT DETECTED NOT DETECTED Final    Comment: (NOTE) The GeneXpert MRSA Assay (FDA approved for NASAL specimens only), is one component of a comprehensive MRSA colonization surveillance program. It is not intended to diagnose MRSA infection nor to guide or monitor treatment for MRSA infections. Test performance is not FDA approved in patients less than 82 years old. Performed at [facility], [address], [place], [place] [address]     Radiology Studies: No results found.  Scheduled Meds:  [START ON 04/13/2023] amLODipine  5 mg Per Tube Daily   arformoterol  15 mcg Nebulization BID   budesonide (PULMICORT) nebulizer solution  0.5 mg Nebulization BID   Chlorhexidine Gluconate Cloth  6 each Topical Daily   famotidine  40 mg Per Tube Daily   feeding supplement (PROSource TF20)  60 mL Per Tube BID   folic acid  1 mg Per Tube Daily   free water  200 mL Per Tube Q4H   hydrALAZINE  25 mg Per Tube TID   insulin aspart  0-9 Units Subcutaneous Q4H   lactulose  30 g Per Tube BID   metoprolol tartrate  12.5 mg Per Tube BID   multivitamin  15 mL Per Tube Daily   nicotine  21 mg Transdermal Daily   mouth rinse  15 mL Mouth Rinse 4 times per day   pantoprazole (PROTONIX) IV  40 mg Intravenous Q24H   revefenacin  175 mcg Nebulization Daily   rivaroxaban  20 mg Per Tube Q supper   thiamine  100 mg Oral Daily   Or   thiamine  100 mg Intravenous Daily   Continuous Infusions:  dexmedetomidine (PRECEDEX) IV infusion 0.5 mcg/kg/hr (04/12/23 1400)   feeding supplement (OSMOLITE 1.5 CAL) 60 mL/hr at 04/12/23 1400     LOS: 9 days   55 minutes with more than 50% spent in reviewing records, counseling patient/family and coordinating care.  Tobey Grim, MD [facility] [email or web address] 04/12/2023, 3:13 PM

## 2023-04-12 NOTE — Plan of Care (Signed)

## 2023-04-12 NOTE — Progress Notes (Signed)
 Patient ID: Ronnie Dunn, male   DOB: Mar 28, 1960, 63 y.o.   MRN: [id number] S: He remains unresponsive although his wife states he was more vocal this morning.  He had bone marrow biopsy yesterday. O:BP (!) 163/58   Pulse 80   Temp 98.7 F (37.1 C) (Axillary)   Resp 19   Ht 6\' 2"  (1.88 m)   Wt 105.5 kg   SpO2 95%   BMI 29.86 kg/m   Intake/Output Summary (Last 24 hours) at 04/12/2023 1317 Last data filed at 04/12/2023 1148 Gross per 24 hour  Intake 2066.49 ml  Output 2300 ml  Net -233.51 ml   Intake/Output: I/O last 3 completed shifts: In: 3221.8 [I.V.:1241.2; NG/GT:1686.7; IV Piggyback:293.9] Out: 2350 [Urine:2250; Stool:100]  Intake/Output this shift:  Total I/O In: -  Out: 1200 [Urine:1200] Weight change:  Gen: Lying in bed, ill-appearing and flushed, nonverbal CVS:RRR Resp: CTA Abd:+BS, soft, NT/ND Ext: no edema  Recent Labs  Lab 04/06/23 0948 04/07/23 0508 04/08/23 0830 04/09/23 0555 04/09/23 1332 04/09/23 1619 04/10/23 0536 04/10/23 0537 04/11/23 0309 04/12/23 0308  NA 142 145 146* 146*  --   --  145  --  142 149*  K 2.8* 3.2* 2.7* 3.1*  --   --  3.1*  --  3.3* 4.1  CL 100 107 108 111  --   --  114*  --  116* 121*  CO2 25 23 26 25   --   --  24  --  22 21*  GLUCOSE 120* 120* 123* 139*  --   --  196*  --  159* 203*  BUN 33* 43* 58* 60*  --   --  51*  --  41* 41*  CREATININE 2.10* 2.37* 2.91* 2.40*  --   --  1.96*  --  1.78* 1.56*  ALBUMIN 2.0* 2.2* 1.8* 1.7*  --   --  1.6*  --  1.8* 1.7*  CALCIUM 12.3* 12.2* 11.0*  IMSPUN 9.7  --   --  9.2  --  8.3* 8.3*  PHOS 3.9  --  2.5 3.1 2.8 2.5 2.0* 1.9* 2.7  --   AST 27 26 22   --   --   --   --   --   --  34  ALT 5 9 7   --   --   --   --   --   --  7   Liver Function Tests: Recent Labs  Lab 04/07/23 0508 04/08/23 0830 04/09/23 0555 04/10/23 0536 04/11/23 0309 04/12/23 0308  AST 26 22  --   --   --  34  ALT 9 7  --   --   --  7  ALKPHOS 72 77  --   --   --  125  BILITOT 1.1 1.0  --   --   --  0.9  PROT  11.2* 10.8*  --   --   --  9.7*  ALBUMIN 2.2* 1.8*   < > 1.6* 1.8* 1.7*   < > = values in this interval not displayed.   No results for input(s): "LIPASE", "AMYLASE" in the last 168 hours. Recent Labs  Lab 04/10/23 1401 04/11/23 0309 04/12/23 0308  AMMONIA 48* 66* 59*   CBC: Recent Labs  Lab 04/07/23 1251 04/08/23 0830 04/11/23 0309 04/12/23 0308  WBC 9.8 8.9 5.4 5.8  NEUTROABS  --   --  3.7 4.1  HGB 13.1 12.2* 9.8* 9.7*  HCT 43.4 39.2 33.3* 33.7*  MCV  100.7* 99.2 104.7* 105.3*  PLT 363 289 258 276   Cardiac Enzymes: No results for input(s): "CKTOTAL", "CKMB", "CKMBINDEX", "TROPONINI" in the last 168 hours. CBG: Recent Labs  Lab 04/11/23 1929 04/11/23 2332 04/12/23 0346 04/12/23 0744 04/12/23 1144  GLUCAP 181* 181* 212* 235* 148*    Iron Studies: No results for input(s): "IRON", "TIBC", "TRANSFERRIN", "FERRITIN" in the last 72 hours. Studies/Results: DG Chest 1 View Result Date: 04/11/2023 CLINICAL DATA:  Heart failure and altered mental status EXAM: CHEST  1 VIEW COMPARISON:  04/08/2023 FINDINGS: Enteric tube tip below the diaphragm but incompletely assessed. Cardiomegaly with small pleural effusions and pulmonary vascular congestion. Consolidation at the left base. No pneumothorax IMPRESSION: Cardiomegaly with small pleural effusions and pulmonary vascular congestion. Consolidation at the left base may be due to atelectasis or pneumonia. Electronically Signed   By: Jasmine Pang M.D.   On: 04/11/2023 17:17   EEG adult Result Date: 04/11/2023 Charlsie Quest, MD     04/11/2023  7:49 PM Patient Name: Ronnie Dunn MRN: [id number] Epilepsy Attending: Charlsie Quest Referring Physician/Provider: Kalman Shan, MD Date: 3/25/225 Duration: 22.47 mins Patient history: 63yo M with ams. EEG to evaluate for seizure Level of alertness: Awake AEDs during EEG study: None Technical aspects: This EEG study was done with scalp electrodes positioned according to the 10-20  International system of electrode placement. Electrical activity was reviewed with band pass filter of 1-70Hz , sensitivity of 7 uV/mm, display speed of 78mm/sec with a 60Hz  notched filter applied as appropriate. EEG data were recorded continuously and digitally stored.  Video monitoring was available and reviewed as appropriate. Description: EEG showed continuous generalized 3 to 6 Hz theta-delta slowing. Hyperventilation and photic stimulation were not performed.  Of note, study was technically difficult due to myogenic artifact.  ABNORMALITY - Continuous slow, generalized IMPRESSION: This technically difficult study is suggestive of moderate diffuse encephalopathy. No seizures or epileptiform discharges were seen throughout the recording. Charlsie Quest   CT BONE MARROW BIOPSY & ASPIRATION Result Date: 04/11/2023 INDICATION: [id number] Anemia [id number] Hypercalcemia 2665 EXAM: CT GUIDED BONE MARROW ASPIRATION AND CORE BIOPSY MEDICATIONS: None. ANESTHESIA/SEDATION: Local anesthetic was administered. The patient was continuously monitored during the procedure by the interventional radiology nurse under my direct supervision. FLUOROSCOPY TIME:  CT dose; 337 mGycm COMPLICATIONS: None immediate. Estimated blood loss: <5 mL PROCEDURE: RADIATION DOSE REDUCTION: This exam was performed according to the departmental dose-optimization program which includes automated exposure control, adjustment of the mA and/or kV according to patient size and/or use of iterative reconstruction technique. Informed written consent was obtained from the patient and/or patient's representative after a thorough discussion of the procedural risks, benefits and alternatives. All questions were addressed. Maximal Sterile Barrier Technique was utilized including caps, mask, sterile gowns, sterile gloves, sterile drape, hand hygiene and skin antiseptic. A timeout was performed prior to the initiation of the procedure. The patient was positioned  prone and non-contrast localization CT was performed of the pelvis to demonstrate the iliac marrow spaces. Under sterile conditions and local anesthesia, an 11 gauge coaxial bone biopsy needle was advanced into the LEFT iliac marrow space. Needle position was confirmed with CT imaging. Initially, bone marrow aspiration was performed. Next, the 11 gauge outer cannula was utilized to obtain a 2 iliac bone marrow core biopsy. Needle was removed. Hemostasis was obtained with compression. The patient tolerated the procedure well. Samples were prepared with the cytotechnologist. IMPRESSION: Successful CT-guided bone marrow aspiration and biopsy. Roanna Banning, MD [facility] Electronically Signed   By: Roanna Banning M.D.   On: 04/11/2023 11:02    amLODipine  5 mg Oral Daily   arformoterol  15 mcg Nebulization BID   budesonide (PULMICORT) nebulizer solution  0.5 mg Nebulization BID   Chlorhexidine Gluconate Cloth  6 each Topical Daily   famotidine  40 mg Per Tube Daily   feeding supplement (PROSource TF20)  60 mL Per Tube BID   folic acid  1 mg Per Tube Daily   hydrALAZINE  25 mg Per Tube TID   insulin aspart  0-9 Units Subcutaneous Q4H   lactulose  30 g Per Tube BID   metoprolol tartrate  12.5 mg Oral BID   multivitamin  15 mL Per Tube Daily   nicotine  21 mg Transdermal Daily   mouth rinse  15 mL Mouth Rinse 4 times per day   pantoprazole (PROTONIX) IV  40 mg Intravenous Q24H   revefenacin  175 mcg Nebulization Daily   rivaroxaban  20 mg Per Tube Q supper   thiamine  100 mg Oral Daily   Or   thiamine  100 mg Intravenous Daily    BMET    Component Value Date/Time   NA 149 (H) 04/12/2023 0308   NA 128 (L) 06/28/2022 0917   K 4.1 04/12/2023 0308   CL 121 (H) 04/12/2023 0308   CO2 21 (L) 04/12/2023 0308   GLUCOSE 203 (H) 04/12/2023 0308   BUN 41 (H) 04/12/2023 0308   BUN 38 (H) 06/28/2022 0917   CREATININE 1.56 (H) 04/12/2023 0308    CALCIUM 8.3 (L) 04/12/2023 0308   CALCIUM IMSPUN 04/08/2023 0830   GFRNONAA 50 (L) 04/12/2023 0308   GFRAA  12/17/2006 0500    >60        The eGFR has been calculated using the MDRD equation. This calculation has not been validated in all clinical   CBC    Component Value Date/Time   WBC 5.8 04/12/2023 0308   RBC 3.20 (L) 04/12/2023 0308   HGB 9.7 (L) 04/12/2023 0308   HGB 15.4 08/18/2021 0855   HCT 33.7 (L) 04/12/2023 0308   HCT 45.8 08/18/2021 0855   PLT 276 04/12/2023 0308   PLT 512 (H) 08/18/2021 0855   MCV 105.3 (H) 04/12/2023 0308   MCV 98 (H) 08/18/2021 0855   MCH 30.3 04/12/2023 0308   MCHC 28.8 (L) 04/12/2023 0308   RDW 18.4 (H) 04/12/2023 0308   RDW 13.8 08/18/2021 0855   LYMPHSABS 0.9 04/12/2023 0308   LYMPHSABS 1.2 07/07/2021 0920   MONOABS 0.5 04/12/2023 0308   EOSABS 0.1 04/12/2023 0308   EOSABS 0.1 07/07/2021 0920   BASOSABS 0.0 04/12/2023 0308   BASOSABS 0.1 07/07/2021 0920      Assessment/ Plan: AKI - b/l creat 1.1- 1.3 from mid 2024. Pt was diuresed w/ IV lasix 40-80mg  for about 3-4 days, net neg 2 L overall. BP's were high then normal, then dropped ;yesterday prob due to sedation. Pt was hypercalcemic on presentation as well. UA unremarkable, renal US no obstruction. Suspect initial AKI due to hypercalcemia and/or underlying myeloma, 2nd rise may be from diuresis. Creat peaked at 2.91 on 04/08/23 and now down to 1.56 w/ gentle IVF"s.  May need to resume given hypernatremia.  Nothing further to add, will sign off.  Please call with questions or concerns. Hypernatremia - sodium up to 149 today, currently off of D5 and LR.  Will need free water boluses  via NGT and follow sodium levels.  Free water deficit almost 4 liters.  He may need to resume D5W or 1/2 normal saline infusion. Hypercalcemia - getting calcitonin, and sp Zometa, per oncology. 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 Ca down to 9.2 from peak of 12.3.  Concern for myeloma or  Waldenstrom's.   Oncology following.  Will need bone marrow biopsy.  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

## 2023-04-12 NOTE — Plan of Care (Signed)
  Problem: Clinical Measurements: Goal: Diagnostic test results will improve Outcome: Progressing   Problem: Activity: Goal: Risk for activity intolerance will decrease Outcome: Progressing   Problem: Safety: Goal: Ability to remain free from injury will improve Outcome: Progressing   Problem: Clinical Measurements: Goal: Ability to maintain clinical measurements within normal limits will improve Outcome: Not Progressing   Problem: Coping: Goal: Level of anxiety will decrease Outcome: Not Progressing

## 2023-04-13 ENCOUNTER — Encounter (HOSPITAL_COMMUNITY): Payer: Self-pay | Admitting: Internal Medicine

## 2023-04-13 ENCOUNTER — Inpatient Hospital Stay (HOSPITAL_COMMUNITY)

## 2023-04-13 ENCOUNTER — Other Ambulatory Visit: Payer: Self-pay

## 2023-04-13 DIAGNOSIS — R040 Epistaxis: Secondary | ICD-10-CM

## 2023-04-13 DIAGNOSIS — C9000 Multiple myeloma not having achieved remission: Secondary | ICD-10-CM | POA: Diagnosis present

## 2023-04-13 DIAGNOSIS — E870 Hyperosmolality and hypernatremia: Secondary | ICD-10-CM | POA: Diagnosis not present

## 2023-04-13 DIAGNOSIS — G9341 Metabolic encephalopathy: Secondary | ICD-10-CM | POA: Diagnosis not present

## 2023-04-13 DIAGNOSIS — A419 Sepsis, unspecified organism: Secondary | ICD-10-CM

## 2023-04-13 DIAGNOSIS — N179 Acute kidney failure, unspecified: Secondary | ICD-10-CM | POA: Diagnosis not present

## 2023-04-13 LAB — GLUCOSE, CAPILLARY
Glucose-Capillary: 131 mg/dL — ABNORMAL HIGH (ref 70–99)
Glucose-Capillary: 133 mg/dL — ABNORMAL HIGH (ref 70–99)
Glucose-Capillary: 141 mg/dL — ABNORMAL HIGH (ref 70–99)
Glucose-Capillary: 148 mg/dL — ABNORMAL HIGH (ref 70–99)
Glucose-Capillary: 217 mg/dL — ABNORMAL HIGH (ref 70–99)

## 2023-04-13 LAB — BLOOD GAS, ARTERIAL
Acid-base deficit: 1.5 mmol/L (ref 0.0–2.0)
Bicarbonate: 23.7 mmol/L (ref 20.0–28.0)
Drawn by: 72709
FIO2: 100 %
MECHVT: 650 mL
O2 Saturation: 100 %
PEEP: 5 cmH2O
Patient temperature: 37.6
RATE: 18 {breaths}/min
pCO2 arterial: 42 mmHg (ref 32–48)
pH, Arterial: 7.36 (ref 7.35–7.45)
pO2, Arterial: 287 mmHg — ABNORMAL HIGH (ref 83–108)

## 2023-04-13 LAB — CBC
HCT: 29.4 % — ABNORMAL LOW (ref 39.0–52.0)
Hemoglobin: 8.2 g/dL — ABNORMAL LOW (ref 13.0–17.0)
MCH: 30.1 pg (ref 26.0–34.0)
MCHC: 27.9 g/dL — ABNORMAL LOW (ref 30.0–36.0)
MCV: 108.1 fL — ABNORMAL HIGH (ref 80.0–100.0)
Platelets: 241 10*3/uL (ref 150–400)
RBC: 2.72 MIL/uL — ABNORMAL LOW (ref 4.22–5.81)
RDW: 19 % — ABNORMAL HIGH (ref 11.5–15.5)
WBC: 6.9 10*3/uL (ref 4.0–10.5)
nRBC: 0.3 % — ABNORMAL HIGH (ref 0.0–0.2)

## 2023-04-13 LAB — BASIC METABOLIC PANEL WITH GFR
Anion gap: 3 — ABNORMAL LOW (ref 5–15)
Anion gap: 5 (ref 5–15)
BUN: 36 mg/dL — ABNORMAL HIGH (ref 8–23)
BUN: 36 mg/dL — ABNORMAL HIGH (ref 8–23)
CO2: 26 mmol/L (ref 22–32)
CO2: 19 mmol/L — ABNORMAL LOW (ref 22–32)
Calcium: 7.9 mg/dL — ABNORMAL LOW (ref 8.9–10.3)
Calcium: 6.6 mg/dL — ABNORMAL LOW (ref 8.9–10.3)
Chloride: 125 mmol/L — ABNORMAL HIGH (ref 98–111)
Chloride: 108 mmol/L (ref 98–111)
Creatinine, Ser: 1.72 mg/dL — ABNORMAL HIGH (ref 0.61–1.24)
Creatinine, Ser: 1.84 mg/dL — ABNORMAL HIGH (ref 0.61–1.24)
GFR, Estimated: 44 mL/min — ABNORMAL LOW (ref >60)
GFR, Estimated: 41 mL/min — ABNORMAL LOW (ref >60)
Glucose, Bld: 146 mg/dL — ABNORMAL HIGH (ref 70–99)
Glucose, Bld: 626 mg/dL (ref 70–99)
Potassium: 3.7 mmol/L (ref 3.5–5.1)
Potassium: 3.6 mmol/L (ref 3.5–5.1)
Sodium: 154 mmol/L — ABNORMAL HIGH (ref 135–145)
Sodium: 132 mmol/L — ABNORMAL LOW (ref 135–145)

## 2023-04-13 LAB — CBC WITH DIFFERENTIAL/PLATELET
Abs Immature Granulocytes: 0.14 10*3/uL — ABNORMAL HIGH (ref 0.00–0.07)
Basophils Absolute: 0 10*3/uL (ref 0.0–0.1)
Basophils Relative: 1 %
Eosinophils Absolute: 0.1 10*3/uL (ref 0.0–0.5)
Eosinophils Relative: 1 %
HCT: 36.3 % — ABNORMAL LOW (ref 39.0–52.0)
Hemoglobin: 10.7 g/dL — ABNORMAL LOW (ref 13.0–17.0)
Immature Granulocytes: 2 %
Lymphocytes Relative: 16 %
Lymphs Abs: 1.2 10*3/uL (ref 0.7–4.0)
MCH: 30.7 pg (ref 26.0–34.0)
MCHC: 29.5 g/dL — ABNORMAL LOW (ref 30.0–36.0)
MCV: 104.3 fL — ABNORMAL HIGH (ref 80.0–100.0)
Monocytes Absolute: 0.6 10*3/uL (ref 0.1–1.0)
Monocytes Relative: 8 %
Neutro Abs: 5.3 10*3/uL (ref 1.7–7.7)
Neutrophils Relative %: 72 %
Platelets: 288 10*3/uL (ref 150–400)
RBC: 3.48 MIL/uL — ABNORMAL LOW (ref 4.22–5.81)
RDW: 18.7 % — ABNORMAL HIGH (ref 11.5–15.5)
WBC: 7.3 10*3/uL (ref 4.0–10.5)
nRBC: 0.5 % — ABNORMAL HIGH (ref 0.0–0.2)

## 2023-04-13 LAB — COMPREHENSIVE METABOLIC PANEL WITH GFR
ALT: 10 U/L (ref 0–44)
AST: 29 U/L (ref 15–41)
Albumin: 1.8 g/dL — ABNORMAL LOW (ref 3.5–5.0)
Alkaline Phosphatase: 134 U/L — ABNORMAL HIGH (ref 38–126)
Anion gap: 10 (ref 5–15)
BUN: 32 mg/dL — ABNORMAL HIGH (ref 8–23)
CO2: 20 mmol/L — ABNORMAL LOW (ref 22–32)
Calcium: 8.3 mg/dL — ABNORMAL LOW (ref 8.9–10.3)
Chloride: 123 mmol/L — ABNORMAL HIGH (ref 98–111)
Creatinine, Ser: 1.6 mg/dL — ABNORMAL HIGH (ref 0.61–1.24)
GFR, Estimated: 48 mL/min — ABNORMAL LOW (ref >60)
Glucose, Bld: 144 mg/dL — ABNORMAL HIGH (ref 70–99)
Potassium: 3.5 mmol/L (ref 3.5–5.1)
Sodium: 153 mmol/L — ABNORMAL HIGH (ref 135–145)
Total Bilirubin: 0.5 mg/dL (ref 0.0–1.2)
Total Protein: 10.6 g/dL — ABNORMAL HIGH (ref 6.5–8.1)

## 2023-04-13 LAB — LACTIC ACID, PLASMA: Lactic Acid, Venous: 1 mmol/L (ref 0.5–1.9)

## 2023-04-13 LAB — MAGNESIUM: Magnesium: 1.8 mg/dL (ref 1.7–2.4)

## 2023-04-13 LAB — AMMONIA: Ammonia: 53 umol/L — ABNORMAL HIGH (ref 9–35)

## 2023-04-13 LAB — SURGICAL PATHOLOGY

## 2023-04-13 LAB — GLUCOSE, RANDOM: Glucose, Bld: 243 mg/dL — ABNORMAL HIGH (ref 70–99)

## 2023-04-13 MED ORDER — FREE WATER
200.0000 mL | Status: DC
  Administered 2023-04-13 – 2023-04-18 (×54): 200 mL

## 2023-04-13 MED ORDER — ETOMIDATE 2 MG/ML IV SOLN
INTRAVENOUS | Status: AC
  Administered 2023-04-13: 20 mg
  Filled 2023-04-13: qty 20

## 2023-04-13 MED ORDER — FENTANYL CITRATE PF 50 MCG/ML IJ SOSY
PREFILLED_SYRINGE | INTRAMUSCULAR | Status: AC
  Filled 2023-04-13: qty 2

## 2023-04-13 MED ORDER — BORTEZOMIB CHEMO SQ INJECTION 3.5 MG (2.5MG/ML)
1.5000 mg/m2 | Freq: Once | INTRAMUSCULAR | Status: AC
  Administered 2023-04-13: 3.5 mg via SUBCUTANEOUS
  Filled 2023-04-13: qty 1.4

## 2023-04-13 MED ORDER — PALONOSETRON HCL INJECTION 0.25 MG/5ML
0.2500 mg | Freq: Once | INTRAVENOUS | Status: AC
  Administered 2023-04-13: 0.25 mg via INTRAVENOUS
  Filled 2023-04-13: qty 5

## 2023-04-13 MED ORDER — FENTANYL CITRATE PF 50 MCG/ML IJ SOSY
50.0000 ug | PREFILLED_SYRINGE | INTRAMUSCULAR | Status: DC | PRN
  Administered 2023-04-13 – 2023-04-18 (×2): 50 ug via INTRAVENOUS

## 2023-04-13 MED ORDER — SODIUM CHLORIDE 0.9 % IV SOLN: INTRAVENOUS | Status: DC

## 2023-04-13 MED ORDER — LACTATED RINGERS IV BOLUS
500.0000 mL | Freq: Once | INTRAVENOUS | Status: AC
  Administered 2023-04-13: 500 mL via INTRAVENOUS

## 2023-04-13 MED ORDER — DEXTROSE 5 % IV SOLN: INTRAVENOUS | Status: AC

## 2023-04-13 MED ORDER — ORAL CARE MOUTH RINSE
15.0000 mL | OROMUCOSAL | Status: DC
  Administered 2023-04-13 – 2023-04-15 (×18): 15 mL via OROMUCOSAL

## 2023-04-13 MED ORDER — MIDAZOLAM HCL 2 MG/2ML IJ SOLN
INTRAMUSCULAR | Status: AC
  Administered 2023-04-13: 1 mg
  Filled 2023-04-13: qty 2

## 2023-04-13 MED ORDER — POLYETHYLENE GLYCOL 3350 17 G PO PACK
17.0000 g | PACK | Freq: Every day | ORAL | Status: DC
  Administered 2023-04-16: 17 g
  Filled 2023-04-13: qty 1

## 2023-04-13 MED ORDER — ROCURONIUM BROMIDE 10 MG/ML (PF) SYRINGE
PREFILLED_SYRINGE | INTRAVENOUS | Status: AC
  Administered 2023-04-13: 50 mg
  Filled 2023-04-13: qty 10

## 2023-04-13 MED ORDER — ORAL CARE MOUTH RINSE: 15.0000 mL | OROMUCOSAL | Status: DC | PRN

## 2023-04-13 MED ORDER — SODIUM CHLORIDE 0.9 % IV SOLN
400.0000 mg/m2 | Freq: Once | INTRAVENOUS | Status: AC
  Administered 2023-04-13: 1000 mg via INTRAVENOUS
  Filled 2023-04-13: qty 50

## 2023-04-13 MED ORDER — ONDANSETRON HCL 4 MG/2ML IJ SOLN
4.0000 mg | Freq: Four times a day (QID) | INTRAMUSCULAR | Status: DC | PRN
  Administered 2023-04-17: 4 mg via INTRAVENOUS
  Filled 2023-04-13: qty 2

## 2023-04-13 MED ORDER — LACTATED RINGERS IV BOLUS
500.0000 mL | Freq: Once | INTRAVENOUS | Status: AC
Start: 2023-04-13 — End: 2023-04-13
  Administered 2023-04-13: 500 mL via INTRAVENOUS

## 2023-04-13 MED ORDER — FENTANYL CITRATE PF 50 MCG/ML IJ SOSY
50.0000 ug | PREFILLED_SYRINGE | INTRAMUSCULAR | Status: DC | PRN
  Administered 2023-04-13 (×2): 100 ug via INTRAVENOUS
  Administered 2023-04-13: 150 ug via INTRAVENOUS
  Administered 2023-04-13: 200 ug via INTRAVENOUS
  Administered 2023-04-13 – 2023-04-14 (×5): 100 ug via INTRAVENOUS
  Administered 2023-04-14: 200 ug via INTRAVENOUS
  Administered 2023-04-14: 100 ug via INTRAVENOUS
  Administered 2023-04-14: 50 ug via INTRAVENOUS
  Administered 2023-04-14 (×2): 100 ug via INTRAVENOUS
  Administered 2023-04-15: 200 ug via INTRAVENOUS
  Administered 2023-04-15: 150 ug via INTRAVENOUS
  Filled 2023-04-13: qty 1
  Filled 2023-04-13: qty 4

## 2023-04-13 MED ORDER — SODIUM CHLORIDE 0.9 % IV SOLN
40.0000 mg | Freq: Once | INTRAVENOUS | Status: AC
  Administered 2023-04-13: 40 mg via INTRAVENOUS
  Filled 2023-04-13: qty 4

## 2023-04-13 MED ORDER — FENTANYL CITRATE (PF) 100 MCG/2ML IJ SOLN
INTRAMUSCULAR | Status: AC
  Administered 2023-04-13: 100 ug
  Filled 2023-04-13: qty 2

## 2023-04-13 MED ORDER — SODIUM CHLORIDE 0.9% FLUSH
10.0000 mL | Freq: Two times a day (BID) | INTRAVENOUS | Status: DC
  Administered 2023-04-13: 30 mL
  Administered 2023-04-13 – 2023-04-14 (×2): 40 mL
  Administered 2023-04-14 – 2023-04-15 (×3): 10 mL
  Administered 2023-04-16: 30 mL
  Administered 2023-04-16 – 2023-04-17 (×2): 10 mL
  Administered 2023-04-17: 30 mL
  Administered 2023-04-18 – 2023-04-19 (×2): 20 mL
  Administered 2023-04-19: 10 mL
  Administered 2023-04-20: 30 mL
  Administered 2023-04-20: 10 mL

## 2023-04-13 MED ORDER — SODIUM CHLORIDE 0.9 % IV SOLN
1.0000 g | INTRAVENOUS | Status: DC
  Administered 2023-04-13 – 2023-04-16 (×4): 1 g via INTRAVENOUS
  Filled 2023-04-13 (×5): qty 10

## 2023-04-13 MED ORDER — NOREPINEPHRINE 4 MG/250ML-% IV SOLN
0.0000 ug/min | INTRAVENOUS | Status: DC
  Administered 2023-04-13: 6 ug/min via INTRAVENOUS

## 2023-04-13 MED ORDER — QUETIAPINE FUMARATE 50 MG PO TABS
50.0000 mg | ORAL_TABLET | Freq: Two times a day (BID) | ORAL | Status: DC
  Administered 2023-04-13 – 2023-04-16 (×7): 50 mg
  Filled 2023-04-13 (×7): qty 1

## 2023-04-13 MED ORDER — NOREPINEPHRINE 4 MG/250ML-% IV SOLN
INTRAVENOUS | Status: AC
  Filled 2023-04-13: qty 250

## 2023-04-13 MED ORDER — ONDANSETRON HCL 4 MG PO TABS: 4.0000 mg | ORAL_TABLET | Freq: Four times a day (QID) | ORAL | Status: DC | PRN

## 2023-04-13 MED ORDER — FENTANYL 2500MCG IN NS 250ML (10MCG/ML) PREMIX INFUSION
25.0000 ug/h | INTRAVENOUS | Status: DC
  Administered 2023-04-13: 50 ug/h via INTRAVENOUS
  Administered 2023-04-14: 200 ug/h via INTRAVENOUS
  Administered 2023-04-14: 100 ug/h via INTRAVENOUS
  Administered 2023-04-15: 200 ug/h via INTRAVENOUS
  Administered 2023-04-15: 100 ug/h via INTRAVENOUS
  Administered 2023-04-17: 150 ug/h via INTRAVENOUS
  Administered 2023-04-17: 100 ug/h via INTRAVENOUS
  Administered 2023-04-18 – 2023-04-19 (×2): 150 ug/h via INTRAVENOUS
  Administered 2023-04-19 – 2023-04-20 (×2): 50 ug/h via INTRAVENOUS
  Filled 2023-04-13 (×11): qty 250

## 2023-04-13 MED ORDER — DOCUSATE SODIUM 50 MG/5ML PO LIQD
100.0000 mg | Freq: Two times a day (BID) | ORAL | Status: DC
  Administered 2023-04-13 – 2023-04-16 (×3): 100 mg
  Filled 2023-04-13 (×4): qty 10

## 2023-04-13 MED ORDER — ARTIFICIAL TEARS OPHTHALMIC OINT
TOPICAL_OINTMENT | Freq: Three times a day (TID) | OPHTHALMIC | Status: AC | PRN
  Administered 2023-04-14 – 2023-04-18 (×2): 1 via OPHTHALMIC
  Filled 2023-04-13: qty 3.5

## 2023-04-13 MED ORDER — SODIUM CHLORIDE 0.9 % IV SOLN: INTRAVENOUS | Status: AC | PRN

## 2023-04-13 MED ORDER — PROCHLORPERAZINE EDISYLATE 10 MG/2ML IJ SOLN: 10.0000 mg | Freq: Four times a day (QID) | INTRAMUSCULAR | Status: AC | PRN

## 2023-04-13 MED ORDER — SODIUM CHLORIDE 0.9% FLUSH: 10.0000 mL | INTRAVENOUS | Status: DC | PRN

## 2023-04-13 NOTE — Progress Notes (Signed)
 eLink Physician-Brief Progress Note Patient Name: Ronnie Dunn DOB: 1960/04/26 MRN: [id number]   Date of Service  04/13/2023  HPI/Events of Note  Patient with dry, irritated eyes.  eICU Interventions  Lacrilube ordered.        Migdalia Dk 04/13/2023, 9:19 PM

## 2023-04-13 NOTE — Progress Notes (Addendum)
 PROGRESS NOTE  Ronnie Dunn  WUJ:[id number] DOB: 01-06-1961 DOA: 04/03/2023 PCP: Ronnie Ravel, MD  Consultants  Brief Narrative: 63 yo male  with past medical history significant for COPD, chronic diastolic congestive heart failure, HTN, HLD, paroxysmal atrial fibrillation on anticoagulation with Xarelto, GERD, OSA, tobacco use disorder, alcohol use disorder, obesity who presented to [facility] ED from home via EMS on 04/03/2022 with complaints of productive cough, fever/chills, nausea, progressive weakness/fatigue and shortness of breath over the last 2 weeks.  Spouse also noted that he was confused, not wanting to eat or drink anything.  Also reported to be hypothermic with temperature 94.0 F.  Also has had complaints of low back pain for past two weeks which is new,no falls reported  Recently completed a course of azithromycin prescribed by his pulmonologist, Dr. Judeth Dunn which did not improve his symptoms.  Given progression, confusion spouse called EMS and patient was brought to the ED for further evaluation management.  Admitted for same.  Patient was transferred to stepdown on 3/21 due to respiratory distress   CT chest abdomen and pelvis shows small bilateral pleural effusion, small pericardial effusion, infrarenal abdominal aortic aneurysm 4.2 x 3.5 cm recommending follow-up CT or MRI in 12 months, CAD.   Assessment & Plan:   Principal Problem:   Encephalopathy Active Problems:   Pneumonia due to infectious organism   AKI (acute kidney injury) (HCC)   Acute metabolic encephalopathy   Hypokalemia   **Addendum 04/13/2023 12:29 PM Patient intubated earlier this AM, therefore moved to [facility] service.  Will be happy to pick him up after extubation/when cleared from PCCM service   Metabolic encephalopathy - Now main issue leading to continued hospitalization.  - working diagnoses is most likely lymphoproliferative d/o explaining his multitude of symptoms, including mental  status changes and persistent ammonia.   - does have history of EtOH/hyponatremic sz in past.  EEG good here. Notes to this point all point towards MRI -- attempt made several days ago but he desaturated when laid flat.  CT head normal.  Ammonia remains elevated.  On lactulose Cerebel eeg moderate encephalopathy no seizure activity is noted  - Precedex restarted yesterday.  Remains sedated this AM  Hypernatremia   - evidently patient pulled Cortrak last night so NGT is out.  Therefore not receiving free water flushes.   - Na+ increased today to 153 from 149 yesterday.   - Nephrology started free water flushes yesterday 3/26 via NG q4 hours.  Will now need to switch to D5W.  Hyperglycemia may limit amount of this we can use, especially once decadron started.   - If/when sugars increase, will switch to 1/2NS.  Cover with insulin. - Recheck BMP this AM.  - Free water deficit >4L   Paroxysmal A-fib/A-fib RVR  has history of atrial fibrillation on Xarelto, beta-blockers were stopped as patient became bradycardic after starting Precedex. - Precedex stopped this AM due to bradycardia - Patient became much more agitated this AM, yelling out, picking at his mitts - HR and BP both also escalated after stopping precedex. Better after precedex resumed - Added metoprolol IV and po to regimen yesterday in light of ongoing elevated BP.   Fevers, returned: - had been afebrile, spiked fever again yesterday PM - previously treated with vancomycin cefepime and Rocephin etc. initially.  ID was consulted.  COVID RSV and influenza were negative  - more likely this is now secondary to MM -- but CT and CXR w/in past 24  hours have both shown lung consolidation.   - would favor treatment in light of starting chemo   Multiple myeloma - diagnosed via bone marrow biopsy as of yesterday PM - greatly appreciate Oncology input -  SPEP still pending  - plan is to talk with wife and initiate treatment, possibly as  early as tomorrow.   - pulsed decadron per oncology.  Watch CBG, especially with D5W  Hypokalemia Better today.  Follow    COPD Not oxygen dependent at baseline.  Follows with pulmonology outpatient Dr. Judeth Dunn. On Trelegy Ellipta at baseline   Low back pain for the last 2 weeks per wife MRI lumbar spine -lumbar spondylosis, moderate bilateral neuroforaminal stenosis at L5-S1, mild spinal canal stenosis, moderate right lateral recess stenosis at L4-L5 - doesn't appear to be playing into his metabolic encephalopathy   Chronic diastolic congestive heart failure, decompensated Essential hypertension Home regimen includes metoprolol succinate 50 mg p.o. daily, hydralazine 50 mg p.o. 3 times daily, spironolactone 25 mg p.o. daily, lisinopril 5 mg p.o. daily, furosemide 120 mg p.o. daily.   All this meds have been on hold except hydralazine - started back on beta blocker to help with heart rate (a fib/RVR) plus hypertension.  - watch for signs of fluid overload in light of CHF dx and restarting fluids, although he is notably fluid down now.     GERD -- Protonix . daily   HLD -- Atorvastatin. daily   Tobacco use disorder EtOH use disorder Counsel need for complete cessation/abstinence. --Nicotine patch   Obesity, class I Body mass index is 31.53 kg/m.     Moderate hypercalcemia -resolved.  Status post Zometa  Calcitonin Appreciate oncology following   AKI  - creatinine improvement with slow IV fluids.  ultrasound of the kidneys showed no hydronephrosis.   - creatinine continues downward trend - nephrology has signed off.  Restarted fluids as above.   Nutrition Problem: Inadequate oral intake Etiology: inability to eat Signs/Symptoms: NPO status Interventions: Refer to RD note for recommendations Pressure Injury 04/09/23 Back Right;Mid Deep Tissue Pressure Injury - Purple or maroon localized area of discolored intact skin or blood-filled blister due to damage of underlying  soft tissue from pressure and/or shear. 4cm X 2cm purple wound (Active)  04/09/23 1039  Location: Back  Location Orientation: Right;Mid  Staging: Deep Tissue Pressure Injury - Purple or maroon localized area of discolored intact skin or blood-filled blister due to damage of underlying soft tissue from pressure and/or shear.  Wound Description (Comments): 4cm X 2cm purple wound  Present on Admission: No  Dressing Type Foam - Lift dressing to assess site every shift 04/10/23 1133   DVT prophylaxis:   rivaroxaban (XARELTO) tablet 20 mg  Code Status:   Code Status: Full Code Family Communication: wife at bedside Level of care: Stepdown Status is: Inpatient   Consults called: PCCM, nephrology   Subjective: Patient sedated this AM.  Removed NGT last night.  Febrile   Objective: Vitals:   04/13/23 0504 04/13/23 0508 04/13/23 0600 04/13/23 0711  BP: (!) 147/64 (!) 147/64 (!) 178/101   Pulse: 99 81 95   Resp:   (!) 23   Temp:    99.7 F (37.6 C)  TempSrc:    Axillary  SpO2:   95%   Weight:      Height:        Intake/Output Summary (Last 24 hours) at 04/13/2023 0732 Last data filed at 04/13/2023 0515 Gross per 24 hour  Intake 1059.48 ml  Output 4350 ml  Net -3290.52 ml   Filed Weights   04/10/23 0600 04/12/23 0500 04/13/23 0500  Weight: 108.2 kg 105.5 kg 105.3 kg   Body mass index is 29.81 kg/m.  Gen: 63 y.o. male in no apparent distress this AM.  Flushing has resolved from yesterday Pulm: Some wheezing, no increased WOB this AM CV: Tachycardic, heart sounded regular rhythm on auscultation GI: Abdomen soft, no obvious tenderness.  GU and rectal foley in place  Ext: Warm, no deformities,  Skin: No rashes, lesions  Neuro:  not oriented Psych: Sedated   I have personally reviewed the following labs and images: CBC: Recent Labs  Lab 04/07/23 1251 04/08/23 0830 04/11/23 0309 04/12/23 0308 04/13/23 0323  WBC 9.8 8.9 5.4 5.8 7.3  NEUTROABS  --   --  3.7 4.1 5.3   HGB 13.1 12.2* 9.8* 9.7* 10.7*  HCT 43.4 39.2 33.3* 33.7* 36.3*  MCV 100.7* 99.2 104.7* 105.3* 104.3*  PLT 363 289 258 276 288   BMP &GFR Recent Labs  Lab 04/09/23 0555 04/09/23 1332 04/09/23 1619 04/10/23 0536 04/10/23 0537 04/11/23 0309 04/12/23 0308 04/13/23 0323  NA 146*  --   --  145  --  142 149* 153*  K 3.1*  --   --  3.1*  --  3.3* 4.1 3.5  CL 111  --   --  114*  --  116* 121* 123*  CO2 25  --   --  24  --  22 21* 20*  GLUCOSE 139*  --   --  196*  --  159* 203* 144*  BUN 60*  --   --  51*  --  41* 41* 32*  CREATININE 2.40*  --   --  1.96*  --  1.78* 1.56* 1.60*  CALCIUM 9.7  --   --  9.2  --  8.3* 8.3* 8.3*  MG  --  1.8 1.6*  --  2.1 1.8 1.9 1.8  PHOS 3.1 2.8 2.5 2.0* 1.9* 2.7  --   --    Estimated Creatinine Clearance: 61.1 mL/min (A) (by C-G formula based on SCr of 1.6 mg/dL (H)). Liver & Pancreas: Recent Labs  Lab 04/06/23 0948 04/07/23 0508 04/08/23 0830 04/09/23 0555 04/10/23 0536 04/11/23 0309 04/12/23 0308 04/13/23 0323  AST 27 26 22   --   --   --  34 29  ALT 5 9 7   --   --   --  7 10  ALKPHOS 70 72 77  --   --   --  125 134*  BILITOT 0.8 1.1 1.0  --   --   --  0.9 0.5  PROT 11.1* 11.2* 10.8*  --   --   --  9.7* 10.6*  ALBUMIN 2.0* 2.2* 1.8* 1.7* 1.6* 1.8* 1.7* 1.8*   No results for input(s): "LIPASE", "AMYLASE" in the last 168 hours. Recent Labs  Lab 04/09/23 0555 04/10/23 1401 04/11/23 0309 04/12/23 0308 04/13/23 0323  AMMONIA 63* 48* 66* 59* 53*   Diabetic: No results for input(s): "HGBA1C" in the last 72 hours. Recent Labs  Lab 04/12/23 1737 04/12/23 1934 04/12/23 2320 04/13/23 0322 04/13/23 0709  GLUCAP 171* 194* 148* 131* 133*   Cardiac Enzymes: No results for input(s): "CKTOTAL", "CKMB", "CKMBINDEX", "TROPONINI" in the last 168 hours. No results for input(s): "PROBNP" in the last 8760 hours. Coagulation Profile: No results for input(s): "INR", "PROTIME" in the last 168 hours. Thyroid Function Tests: No results for  input(s): "TSH", "T4TOTAL", "  FREET4", "T3FREE", "THYROIDAB" in the last 72 hours. Lipid Profile: Recent Labs    04/10/23 1740  CHOL 76  HDL 15*  LDLCALC 32  TRIG 829  CHOLHDL 5.1   Anemia Panel: No results for input(s): "VITAMINB12", "FOLATE", "FERRITIN", "TIBC", "IRON", "RETICCTPCT" in the last 72 hours. Urine analysis:    Component Value Date/Time   COLORURINE YELLOW 04/05/2023 1042   APPEARANCEUR CLEAR 04/05/2023 1042   LABSPEC 1.014 04/05/2023 1042   PHURINE 7.0 04/05/2023 1042   GLUCOSEU 50 (A) 04/05/2023 1042   HGBUR MODERATE (A) 04/05/2023 1042   BILIRUBINUR NEGATIVE 04/05/2023 1042   KETONESUR NEGATIVE 04/05/2023 1042   PROTEINUR 100 (A) 04/05/2023 1042   UROBILINOGEN 0.2 12/15/2006 1712   NITRITE NEGATIVE 04/05/2023 1042   LEUKOCYTESUR NEGATIVE 04/05/2023 1042   Sepsis Labs: Invalid input(s): "PROCALCITONIN", "LACTICIDVEN"  Microbiology: Recent Results (from the past 240 hours)  Resp panel by RT-PCR (RSV, Flu A&B, Covid) Anterior Nasal Swab     Status: None   Collection Time: 04/03/23 11:18 AM   Specimen: Anterior Nasal Swab  Result Value Ref Range Status   SARS Coronavirus 2 by RT PCR NEGATIVE NEGATIVE Final    Comment: (NOTE) SARS-CoV-2 target nucleic acids are NOT DETECTED.  The SARS-CoV-2 RNA is generally detectable in upper respiratory specimens during the acute phase of infection. The lowest concentration of SARS-CoV-2 viral copies this assay can detect is 138 copies/mL. A negative result does not preclude SARS-Cov-2 infection and should not be used as the sole basis for treatment or other patient management decisions. A negative result may occur with  improper specimen collection/handling, submission of specimen other than nasopharyngeal swab, presence of viral mutation(s) within the areas targeted by this assay, and inadequate number of viral copies(<138 copies/mL). A negative result must be combined with clinical observations, patient history, and  epidemiological information. The expected result is Negative.  Fact Sheet for Patients:  BloggerCourse.com  Fact Sheet for Healthcare Providers:  SeriousBroker.it  This test is no t yet approved or cleared by the [place] FDA and  has been authorized for detection and/or diagnosis of SARS-CoV-2 by FDA under an Emergency Use Authorization (EUA). This EUA will remain  in effect (meaning this test can be used) for the duration of the COVID-19 declaration under Section 564(b)(1) of the Act, 21 U.S.C.section 360bbb-3(b)(1), unless the authorization is terminated  or revoked sooner.       Influenza A by PCR NEGATIVE NEGATIVE Final   Influenza B by PCR NEGATIVE NEGATIVE Final    Comment: (NOTE) The Xpert Xpress SARS-CoV-2/FLU/RSV plus assay is intended as an aid in the diagnosis of influenza from Nasopharyngeal swab specimens and should not be used as a sole basis for treatment. Nasal washings and aspirates are unacceptable for Xpert Xpress SARS-CoV-2/FLU/RSV testing.  Fact Sheet for Patients: BloggerCourse.com  Fact Sheet for Healthcare Providers: SeriousBroker.it  This test is not yet approved or cleared by the [place] FDA and has been authorized for detection and/or diagnosis of SARS-CoV-2 by FDA under an Emergency Use Authorization (EUA). This EUA will remain in effect (meaning this test can be used) for the duration of the COVID-19 declaration under Section 564(b)(1) of the Act, 21 U.S.C. section 360bbb-3(b)(1), unless the authorization is terminated or revoked.     Resp Syncytial Virus by PCR NEGATIVE NEGATIVE Final    Comment: (NOTE) Fact Sheet for Patients: BloggerCourse.com  Fact Sheet for Healthcare Providers: SeriousBroker.it  This test is not yet approved or cleared by the [place]  FDA and has been  authorized for detection and/or diagnosis of SARS-CoV-2 by FDA under an Emergency Use Authorization (EUA). This EUA will remain in effect (meaning this test can be used) for the duration of the COVID-19 declaration under Section 564(b)(1) of the Act, 21 U.S.C. section 360bbb-3(b)(1), unless the authorization is terminated or revoked.  Performed at [facility], [address] [place], [place] [address]   Blood Culture (routine x 2)     Status: None   Collection Time: 04/03/23 11:18 AM   Specimen: BLOOD  Result Value Ref Range Status   Specimen Description   Final    BLOOD LEFT ANTECUBITAL Performed at [facility], [address], [place], [place] [address]    Special Requests   Final    BOTTLES DRAWN AEROBIC AND ANAEROBIC Blood Culture adequate volume Performed at [facility], [address] [place], [place] [address]    Culture   Final    NO GROWTH 5 DAYS Performed at [facility], [address], [place], [place] [address]    Report Status 04/08/2023 FINAL  Final  Blood Culture (routine x 2)     Status: None   Collection Time: 04/03/23 11:18 AM   Specimen: BLOOD RIGHT WRIST  Result Value Ref Range Status   Specimen Description   Final    BLOOD RIGHT WRIST Performed at [facility], [address], [place], [place] [address]    Special Requests   Final    BOTTLES DRAWN AEROBIC AND ANAEROBIC Blood Culture adequate volume Performed at [facility], [address] [place], [place] [address]    Culture   Final    NO GROWTH 5 DAYS Performed at [facility], [address] [place], [place] [address]    Report Status 04/08/2023 FINAL  Final  MRSA Next Gen by PCR, Nasal     Status: None   Collection Time: 04/03/23  5:17 PM   Specimen: Nasal Mucosa; Nasal Swab  Result Value Ref Range Status   MRSA by PCR Next Gen NOT DETECTED NOT DETECTED Final    Comment: (NOTE) The GeneXpert  MRSA Assay (FDA approved for NASAL specimens only), is one component of a comprehensive MRSA colonization surveillance program. It is not intended to diagnose MRSA infection nor to guide or monitor treatment for MRSA infections. Test performance is not FDA approved in patients less than 33 years old. Performed at [facility], [address] [place], [place] [address]   Culture, blood (Routine X 2) w Reflex to ID Panel     Status: None   Collection Time: 04/07/23  1:15 PM   Specimen: BLOOD LEFT HAND  Result Value Ref Range Status   Specimen Description   Final    BLOOD LEFT HAND Performed at [facility], [address] [place], [place] [address]    Special Requests   Final    BOTTLES DRAWN AEROBIC ONLY Blood Culture adequate volume Performed at [facility], [address] [place], [place] [address]    Culture   Final    NO GROWTH 5 DAYS Performed at [facility], [address], [place], [place] [address]    Report Status 04/12/2023 FINAL  Final  Culture, blood (Routine X 2) w Reflex to ID Panel     Status: None   Collection Time: 04/07/23  2:08 PM   Specimen: BLOOD RIGHT ARM  Result Value Ref Range Status   Specimen Description   Final    BLOOD RIGHT ARM Performed at [facility], [address] [place], [place] [address]    Special Requests   Final    BOTTLES DRAWN AEROBIC AND ANAEROBIC Blood Culture results may not be optimal due to an inadequate volume of blood received in culture bottles Performed at [facility], [address] [place], [place] [address]    Culture   Final    NO GROWTH 5 DAYS Performed at [facility], [address], [place], [place] [address]    Report Status 04/12/2023 FINAL  Final  MRSA Next Gen by PCR, Nasal     Status: None   Collection Time: 04/10/23  8:22 AM   Specimen: Nasal Mucosa; Nasal Swab  Result Value Ref Range Status   MRSA by PCR Next Gen NOT  DETECTED NOT DETECTED Final    Comment: (NOTE) The GeneXpert MRSA Assay (FDA approved for NASAL specimens only), is one component of a comprehensive MRSA colonization surveillance program. It is not intended to diagnose MRSA infection nor to guide or monitor treatment for MRSA infections. Test performance is not FDA approved in patients less than 81 years old. Performed at [facility], [address] [place], [place] [address]     Radiology Studies: [place] EKG SITE RITE Result Date: 04/13/2023 If [facility] image not attached, placement could not be confirmed due to current cardiac rhythm.  DG Abd 1 View Result Date: 04/12/2023 CLINICAL DATA:  NG tube EXAM: ABDOMEN - 1 VIEW COMPARISON:  04/07/2023 FINDINGS: No enteric tube visualized on the included portions of the lower chest and upper abdomen. Upper gas pattern is unremarkable IMPRESSION: No enteric tube visualized on the included portions of the lower chest and upper abdomen. Electronically Signed   By: Jasmine Pang M.D.   On: 04/12/2023 23:14    Scheduled Meds:  amLODipine  5 mg Per Tube Daily   arformoterol  15 mcg Nebulization BID   budesonide (PULMICORT) nebulizer solution  0.5 mg Nebulization BID   Chlorhexidine Gluconate Cloth  6 each Topical Daily   famotidine  40 mg Per Tube Daily   feeding supplement (PROSource TF20)  60 mL Per Tube BID   folic acid  1 mg Per Tube Daily   free water  200 mL Per Tube Q2H   hydrALAZINE  25 mg Per Tube TID   insulin aspart  0-9 Units Subcutaneous Q4H   lactulose  30 g Per Tube BID   metoprolol tartrate  12.5 mg Per Tube BID   multivitamin  15 mL Per Tube Daily   nicotine  21 mg Transdermal Daily   mouth rinse  15 mL Mouth Rinse 4 times per day   pantoprazole (PROTONIX) IV  40 mg Intravenous Q24H   QUEtiapine  50 mg Oral QHS   revefenacin  175 mcg Nebulization Daily   rivaroxaban  20 mg Per Tube Q supper   thiamine  100 mg Oral Daily   Or   thiamine  100 mg  Intravenous Daily   Continuous Infusions:  dexmedetomidine (PRECEDEX) IV infusion 0.9 mcg/kg/hr (04/13/23 0523)   feeding supplement (OSMOLITE 1.5 CAL) Stopped (04/12/23 1950)     LOS: 10 days   55 minutes with more than 50% spent in reviewing records, counseling patient/family and coordinating care.  Tobey Grim, MD [facility] [email or web address] 04/13/2023, 7:32 AM

## 2023-04-13 NOTE — Progress Notes (Signed)
 Nutrition Follow-up  DOCUMENTATION CODES:   Not applicable  INTERVENTION:  - Once enteral access obtained and able to restart tube feeds, recommend: Osmolite 1.5 at 55 ml/h (1320 ml per day) Prosource TF20 60 ml TID Provides 2220 kcal, 143 gm protein, 1006 ml free water daily  - FWF per CCM/MD. Currently ordered Q2H which in addition to TF would provide 3475mL/day   NUTRITION DIAGNOSIS:   Inadequate oral intake related to inability to eat as evidenced by NPO status. *ongoing  GOAL:   Patient will meet greater than or equal to 90% of their needs *not met  MONITOR:   Diet advancement, Labs, Weight trends  REASON FOR ASSESSMENT:   Other (Comment) (Cortrak)    ASSESSMENT:   63 y.o. male who has a PMH significant for COPD, OSA on CPAP, CHF, HTN, HLD, alcohol use who presented with cough, fever/chills, nausea, weakness, fatigue, dyspnea x 2 weeks. Admitted for sepsis and acute metabolic encephalopathy.  3/17 Admit 3/21 Rapid called, patient with AMS, transferred to ICU; Cortrak placed 3/23 Osmolite 1.5 started @ 20 in PM 3/25 Bone marrow biopsy; reached goal TF 3/26 Patient pulled out Cortrak; TF stopped 3/27 Intubated  Patient intubated this AM due to respiratory failure.   Patient is currently intubated on ventilator support MV: 9.9 L/min Temp (24hrs), Avg:99.8 F (37.7 C), Min:99.2 F (37.3 C), Max:100.5 F (38.1 C)  No family at bedside at time of visit.  Discussed patient with RN. Cortrak was placed this AM and placement looked great however shortly afterwards patient began to decompensate and had to be intubated so Cortrak pulled.  OGT placed after intubation but per xray no OGT able to be visualized.  Once enteral access obtained, recommend restarting tube feeds. Will adjust goal slightly now that intubated. Can start at goal as patient previously tolerating TF.  Of note, pathology came back on bone marrow biopsy and confirms patient has multiple  myeloma. Oncology discussing with wife about treatment however now that patient intubated unsure of plan.    Admit weight: 245# Current weight: 232# I&O's: +1.2L since admit   Medications reviewed and include: Colace, Miralax, Lactulose BID, 1mg  folic acid, MVI, 100mg  thiamine, Q2H FWF, D5 @ 166mL/hr (provides 408 kcals over 24 hours) Precedex Fentanyl Labs reviewed:  Na 153 Creatinine 1.60 Blood Glucose 131-235 x24 hours   Diet Order:   Diet Order             Diet NPO time specified  Diet effective now                   EDUCATION NEEDS:  Education needs have been addressed  Skin:  Skin Assessment: Skin Integrity Issues: Skin Integrity Issues:: DTI DTI: Mid Right Back  Last BM:  3/26 - rectal tube  Height:  Ht Readings from Last 1 Encounters:  04/13/23 6' 2.02" (1.88 m)   Weight:  Wt Readings from Last 1 Encounters:  04/13/23 105.3 kg   Ideal Body Weight:  86.36 kg  BMI:  Body mass index is 29.79 kg/m.  Estimated Nutritional Needs:  Kcal:  2200-2500 kcals Protein:  130-145 grams Fluid:  >/= 2.2L    Shelle Iron RD, LDN Contact via Secure Chat.

## 2023-04-13 NOTE — Progress Notes (Signed)
 Peripherally Inserted Central Catheter Placement  The IV Nurse has discussed with the patient and/or persons authorized to consent for the patient, the purpose of this procedure and the potential benefits and risks involved with this procedure.  The benefits include less needle sticks, lab draws from the catheter, and the patient may be discharged home with the catheter. Risks include, but not limited to, infection, bleeding, blood clot (thrombus formation), and puncture of an artery; nerve damage and irregular heartbeat and possibility to perform a PICC exchange if needed/ordered by physician.  Alternatives to this procedure were also discussed.  Bard Power PICC patient education guide, fact sheet on infection prevention and patient information card has been provided to patient /or left at bedside.    PICC Placement Documentation  PICC Triple Lumen 04/13/23 Right Basilic 42 cm 0 cm (Active)  Indication for Insertion or Continuance of Line Prolonged intravenous therapies 04/13/23 1031  Exposed Catheter (cm) 0 cm 04/13/23 1031  Site Assessment Clean, Dry, Intact 04/13/23 1031  Lumen #1 Status Flushed;Saline locked;Blood return noted 04/13/23 1031  Lumen #2 Status Flushed;Saline locked;Blood return noted 04/13/23 1031  Lumen #3 Status Blood return noted;Flushed;Saline locked 04/13/23 1031  Dressing Type Transparent;Securing device 04/13/23 1031  Dressing Status Antimicrobial disc/dressing in place 04/13/23 1031  Line Adjustment (NICU/IV Team Only) No 04/13/23 1031  Dressing Change Due 04/20/23 04/13/23 1031       Romie Jumper 04/13/2023, 10:33 AM

## 2023-04-13 NOTE — Procedures (Signed)
 Intubation Procedure Note  Ronnie Dunn  [id number]  1960/08/12  Date:04/13/23  Time:10:13 AM   Provider Performing:Dalina Samara V. Demetruis Depaul    Procedure: Intubation (31500)  Indication(s) Respiratory Failure  Consent Risks of the procedure as well as the alternatives and risks of each were explained to the patient and/or caregiver.  Consent for the procedure was obtained and is signed in the bedside chart   Anesthesia Etomidate, Versed, Fentanyl, and Rocuronium   Time Out Verified patient identification, verified procedure, site/side was marked, verified correct patient position, special equipment/implants available, medications/allergies/relevant history reviewed, required imaging and test results available.   Sterile Technique Usual hand hygeine, masks, and gloves were used   Procedure Description Patient positioned in bed supine.  Sedation given as noted above.  Patient was intubated with endotracheal tube using Glidescope.  View was Grade 1 full glottis .  Number of attempts was 1.  Colorimetric CO2 detector was consistent with tracheal placement.   Complications/Tolerance None; patient tolerated the procedure well. Chest X-ray is ordered to verify placement.   EBL Minimal   Specimen(s) None  Triva Hueber V. Vassie Loll MD

## 2023-04-13 NOTE — Progress Notes (Signed)
 He developed nasal bleeding after attempted core track placement He was lethargic with guppy breathing, [place] intubated for airway protection, blood suctioned out of oropharynx, no active bleeding site identified. Vent and sedation orders placed , Precedex and fentanyl with goal RASS 0 to -1 Wife updated in detail. Will plan for MRI Nursing staff unable to place OG, will attempt to place with glide scope  My independent review care time was 35 minutes  Dejon Jungman V. Vassie Loll MD

## 2023-04-13 NOTE — Progress Notes (Signed)
 START ON PATHWAY REGIMEN - Multiple Myeloma and Other Plasma Cell Dyscrasias     A cycle is every 28 days:     Dexamethasone      Bortezomib      Cyclophosphamide   **Always confirm dose/schedule in your pharmacy ordering system**  Patient Characteristics: Multiple Myeloma, Newly Diagnosed, Transplant Ineligible or Refused, High Risk Disease Classification: Multiple Myeloma Therapeutic Status: Newly Diagnosed R2-ISS Staging: III Is Patient Eligible for Transplant<= Transplant Ineligible or Refused Risk Status: High Risk Intent of Therapy: Non-Curative / Palliative Intent, Discussed with Patient

## 2023-04-13 NOTE — Progress Notes (Signed)
 NAME:  Ronnie Dunn, MRN:  [id number], DOB:  1960/11/24, LOS: 10 ADMISSION DATE:  04/03/2023, CONSULTATION DATE:  04/07/23 REFERRING MD:  Jerolyn Center CHIEF COMPLAINT:  AMS   BRIEF  Ronnie Dunn is a 63 y.o. male who has a PMH as below including but not limited to COPD, OSA on CPAP, chronic diastolic CHF, HTN, HLD, PAF on Xarelto, tobacco dependence, alcohol use.  He is followed by [name] as an outpatient. He presented to [facility] ED on 3/17 with cough, fever/chills, nausea, weakness, fatigue, dyspnea x 2 weeks.  He had some confusion on and off and decreased appetite.  He had also complained of low back pain which was new and was not associated with any recent fall or trauma.  He had productive sputum, green in color.  He recently completed a course of azithromycin as an outpatient but had no relief with this.  Due to ongoing symptoms, he presented to the ED for further workup.  Initial chest x-ray showed bilateral interstitial opacities.  He was started on IV Lasix.  Renal function worsened, he had renal ultrasound on 3/19 that showed normal kidneys.  Follow-up chest x-ray showed cardiomegaly and ongoing interstitial opacities with small pleural effusions.  Diuresis was continued; however, renal function worsened further.  Was initially on vancomycin and cefepime however cefepime was stopped due to altered mental status.  This was switched to ceftriaxone on 3/19.  Overnight 3/20, he developed A-fib RVR.  He received Lopressor with improvement in heart rate.  Early a.m. 3/21 he had altered mental status and new fever to 102.9 rectally.  He was subsequently transferred to the ICU and PCCM and infectious disease were asked to see in consultation.  Upon arrival to the ICU, he is minimally responsive but is protecting his airway.  He has heart rates in the 110s to 120s, blood pressure stable.  Mucous membranes are very dry.  ABG was obtained and was normal (7.52/41/21). He has clear urine in his Foley.   Antibiotics were escalated from ceftriaxone to Zosyn.  Infectious disease has seen him and we have agreed to assess CT chest/abdomen/pelvis.  Pertinent  Medical History:  has Seizure (HCC); Hyponatremia; Alcohol use disorder, moderate, dependence (HCC); Class 1 obesity; Essential hypertension; Type 2 diabetes mellitus without complication, without long-term current use of insulin (HCC); Chronic obstructive pulmonary disease (HCC); Gastroesophageal reflux disease; Lower extremity edema; Permanent atrial fibrillation (HCC); OSA (obstructive sleep apnea); Chronic heart failure with preserved ejection fraction (HCC); Seizures (HCC); Protein-calorie malnutrition, moderate (HCC); Hypomagnesemia; Alcohol abuse; Obesity (BMI 30-39.9); Noncompliance with medication regimen; Aortic root dilation (HCC); Acquired thrombophilia (HCC); Pneumonia due to infectious organism; AKI (acute kidney injury) (HCC); Acute metabolic encephalopathy; Encephalopathy; Hypokalemia; Acute respiratory failure with hypoxia (HCC); and 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) on their problem list.  Significant Hospital Events: Including procedures, antibiotic start and stop dates in addition to other pertinent events   3/17 admit 3.19 - BNPP 800 3/20 - HFNC O2 need 3/21 - Altered but protecting airway. Vitals stable.Marland Kitchen 7L [place] O2 need. BN 275 (down from 813)  - ID Consult: Fever, sepsis of unknown source--potentially due to aspiration in context of encephalopathy. Alcoholic liver disease. 3/AKI. 4Encephalopathy --presumably from alcohol withdrawal, hepatic encaphalopathy though no cirrhosis on CT at [facility] in 2019   3/22 - HEme conslt - cocern for myleoma v Waldenstrom ( .  His total protein is quite high.  His calcium is quite high.  His albumin is low.) Getting seruum viscolity due to  Acue encephalpthy. Pan CT   - unremarlkable. ID thinks low odds for infection. ID recommending MRI. RN/Wife report Obutndatioin with restlessness - on prn  ativan. Last drink > 2 weeks go . Wife describes 2022 admit for Low NA - and seziure like activity but no EEG or neuro conslt in record.  Started Precedex.  Antibiotics all stopped by ID  3/23 cerebral EEG without any seizures but showing encephalopathy.  MRI brain could not be done because patient desaturated while supine. Per RN "His blood is too thick to obtain a serum protein, parathyroid, viscosity. " Ammonia 65.  3/25 bone marrow biopsy   Interim History / Subjective:   Stayed off Precedex in the daytime but resumed towards evening, Agitation overnight and pulled out core track. Remains on 6 L nasal cannula Low-grade fever   Objective:  Blood pressure (!) 178/101, pulse 95, temperature 99.7 F (37.6 C), temperature source Axillary, resp. rate (!) 23, height 6\' 2"  (1.88 m), weight 105.3 kg, SpO2 95%.        Intake/Output Summary (Last 24 hours) at 04/13/2023 0847 Last data filed at 04/13/2023 0515 Gross per 24 hour  Intake 971.27 ml  Output 3700 ml  Net -2728.73 ml   Filed Weights   04/10/23 0600 04/12/23 0500 04/13/23 0500  Weight: 108.2 kg 105.5 kg 105.3 kg    Examination: General Appearance: Chronically ill-appearing, OBESE - + Head:  Normocephalic, without obvious abnormality, atraumatic Eyes: Mild pallor, no icterus Neck:  Supple,  No lymphadenopathy Lungs: Clear breath sounds bilateral, no accessory muscle use Heart:  S1 and S2 normal, no murmur, Abdomen:  Soft, no masses, no organomegaly Extremities: No deformity, no edema Skin:  ntact in exposed areas . Sacral area - not examined Neurologic: RASS-2, on Precedex does not follow commands   Labs show hypernatremia 153, BUN/creatinine 32/1.6, albumin 1.8, no leukocytosis, stable anemia Chest x-ray 3/25 shows consolidation in the left base  Labs/imaging personally reviewed:  CT head 3/19 > neg. Renal US 3/19 > neg. CT chest/abd/pelv 3/21 > infrarenal AAA Cerebral EEG 04/08/2023: No seizures. CT chest without  con 3/23 collapse consolidation left lower lobe , scattered lytic lesions  Assessment & Plan:  HX OF ETOH Acute metabolic encephalopathy - multifactorial 2/2 hyperammonemia, EtOH, presumed early developing sepsis of unclear etiology = . CT head neg, TSH, ABG normal. Folate elevated, B12 pending. - EEG 3/22 & 3/25 No seizures but shows encephalopathy. Unable to do MRI because of desaturations  More history obtained from wife -he was a heavy drinker has not consumed alcohol for 1 week prior to admission, heavy smoker  PLAN - MRI brain wo contrast (has low GFR) -> when possible and more stable  -Start Seroquel 50 twice daily and attempt to taper PRECEDEX, goal RASS 0 - avoid cephalosporins - PRN Haldol, minimize Ativan, if QTc okay -Nicotine patch -Continue lactulose, titrate to 2-3 soft bowel movements per day  Concern for sepsis y - some suspicion for early aspiration PNA.  Left lower lobe collapse/consolidation oral antibiotic stopped 04/08/2023 infectious diseases  04/09/2023: Off antibiotics.  ID consult does not think patient has infectious problem  Plan Since he is being started on chemotherapy, will treat left lower lobe consolidation with ceftriaxone for 5 days  Acute hypoxic respiratory failure - 2/2 above + baseline COPD (though not on O2), OSA. -Baseline 2 L nasal cannula at night Hx COPD, OSA, tobacco dependence.  Needing 6 L oxygen off for several days..  Encephalopathy prevents BiPAP or CPAP  PLNA - Continue supplemental O2 as needed to maintain SpO2 > 92%. -triple therapy nebs in lieu of PTA Trelegy. -Smoking cessation counseling at discharge  AKI.-Initially due to hypercalcemia, improved and then worsened again likely due to diuresis  PLAN Per renal On D5W for hypernatremia  Hypercalcemia Hypokalemia High-protein,Hypoalbiuminema Hyperviscosity - lab unable to sample many test. -Despite patient being on Xarelto  -High kappa and lambda free light chains and  bone marrow biopsy confirms myeloma  Plan  - - calcitonin per heme -Plan to start chemotherapy per oncology   Hx dCHF, A.fib on Xarelto, HTN, HLD.  -Echo 04/08/2023: Normal left ventricle ejection fraction but right ventricular dysfunction present and is new compared to July 2024. Currently on schedled lopressr and hydralazine.  Having bradycardia with Precedex.  PLAN  -Continue hydralazine scheduled - Continue Xarelto.  PCCM will follow while on Precedex  Best practice (evaluated daily):  Diet/type: NPO -RD consultation for tube feeds DVT prophylaxis: DOAC  Pressure ulcer(s): pressure ulcer assessment deferred  GI prophylaxis: Pepcid Lines: N/A Foley:  N/A Code Status:  full code Last date of multidisciplinary goals of care discussion: None yet.  Updated wife at bedside   ATTESTATION & SIGNATURE    Cyril Mourning MD. [facility]. [facility] Pager : [phone]  If no response to pager , please call [phone] until 7 pm After 7:00 pm call Elink  [phone]    04/13/2023 8:47 AM     LABS    PULMONARY Recent Labs  Lab 04/06/23 1633 04/07/23 1026  PHART  --  7.52*  PCO2ART  --  41  PO2ART  --  101  HCO3 32.8* 33.1*  O2SAT 91.9 98.5    CBC Recent Labs  Lab 04/11/23 0309 04/12/23 0308 04/13/23 0323  HGB 9.8* 9.7* 10.7*  HCT 33.3* 33.7* 36.3*  WBC 5.4 5.8 7.3  PLT 258 276 288    COAGULATION No results for input(s): "INR" in the last 168 hours.   CARDIAC  No results for input(s): "TROPONINI" in the last 168 hours. No results for input(s): "PROBNP" in the last 168 hours.   CHEMISTRY Recent Labs  Lab 04/09/23 0555 04/09/23 1332 04/09/23 1619 04/10/23 0536 04/10/23 0537 04/11/23 0309 04/12/23 0308 04/13/23 0323  NA 146*  --   --  145  --  142 149* 153*  K 3.1*  --   --  3.1*  --  3.3* 4.1 3.5  CL 111  --   --  114*  --  116* 121* 123*  CO2 25  --   --  24  --  22 21* 20*  GLUCOSE 139*  --   --  196*  --  159* 203*  144*  BUN 60*  --   --  51*  --  41* 41* 32*  CREATININE 2.40*  --   --  1.96*  --  1.78* 1.56* 1.60*  CALCIUM 9.7  --   --  9.2  --  8.3* 8.3* 8.3*  MG  --  1.8 1.6*  --  2.1 1.8 1.9 1.8  PHOS 3.1 2.8 2.5 2.0* 1.9* 2.7  --   --    Estimated Creatinine Clearance: 61.1 mL/min (A) (by C-G formula based on SCr of 1.6 mg/dL (H)).   LIVER Recent Labs  Lab 04/06/23 1308 04/07/23 0508 04/08/23 0830 04/09/23 0555 04/10/23 0536 04/11/23 0309 04/12/23 0308 04/13/23 0323  AST 27 26 22   --   --   --  34  29  ALT 5 9 7   --   --   --  7 10  ALKPHOS 70 72 77  --   --   --  125 134*  BILITOT 0.8 1.1 1.0  --   --   --  0.9 0.5  PROT 11.1* 11.2* 10.8*  --   --   --  9.7* 10.6*  ALBUMIN 2.0* 2.2* 1.8* 1.7* 1.6* 1.8* 1.7* 1.8*     INFECTIOUS Recent Labs  Lab 04/07/23 1315 04/07/23 1552 04/08/23 2359 04/09/23 0555  LATICACIDVEN 2.0* 2.4* 1.3 1.2  PROCALCITON 1.34  --   --   --      ENDOCRINE CBG (last 3)  Recent Labs    04/12/23 2320 04/13/23 0322 04/13/23 0709  GLUCAP 148* 131* 133*

## 2023-04-13 NOTE — Progress Notes (Addendum)
 Ok to proceed with Velcade, Cytoxan and Dexamethasone today per Dr. Myna Hidalgo. Ok to treat with Na 153 and Scr 1.6. Proceed with Cytoxan 400 mg/m2. MD will add VZV prophylaxis (Acyclovir, Famvir) at future time.   Anola Gurney [place], [place], BCPS, BCOP 04/13/2023 12:28 PM

## 2023-04-13 NOTE — Progress Notes (Addendum)
 We now have a diagnosis for Ronnie Dunn.  I spoke with the pathologist yesterday afternoon.  The bone marrow biopsy that has does show myeloma.  He has IgG kappa myeloma which is most common type of myeloma.  I do not have the SPEP results back yet.  I do not have the IgG level result back yet.  However, his serum kappa light chain is quite high at 460 mg/L.  His 24-hour urine shows Bence-Jones protein.  His free kappa light chain is 1083 mg/L.  His labs today show sodium of 153.  Potassium 3.5.  Chloride is 123.  His BUN is 32 creatinine 1.6.  Calcium 8.3.  His albumin is 1.8.  Total protein is 10.6.  His white cell count 7.3.  Hemoglobin 10.7.  Platelet count 288,000.  I think we are going to have to treat this.  I will see if we cannot get treatment going quickly.  I will have to talk to his wife.  I want sure for going to affect his mental state.  I know that Neurology has seen him.  He had an EEG done which showed diffuse slowing.  No seizures or epileptiform discharges were noted.  He still was not all that responsive.  I think that we can get him started on treatment with Decadron/Velcade/Cytoxan.  I think this would be reasonable and should be very well-tolerated.  Again, I will have to talk to his wife about all of this.  He probably will need to have a PICC line since he has very limited IV access.  He is getting fed through a feeding tube.  I cannot imagine that his serum viscosity is all that high given that IgG myeloma typically does not lead to high serum viscosity's.  It will be very interesting to see what his total IgG level is and what his monoclonal spike is.  I really should not be a lot of surprise about the diagnosis.  His calcium certainly has responded well to calcitonin.  We will get him on some pulsed Decadron right now.  I realize this will affect his blood sugars.  However, I think the hyperglycemia is fairly manageable.  His BP is quite high.  This morning the  blood pressure was 178/101.  His pulse was 95.  Temperature was 100 degrees.  His lungs sounded pretty decent.  Chest x-ray yesterday which may have shown pneumonia.  A chest CT scan that was done on 04/09/2023.  This showed left lower lobe consolidation.  He had lytic lesions.  Again, I have  to institute therapy on him.  I would like to hope that the earliest we can probably get this done is tomorrow.  I really do appreciate the great care that he is getting from the staff in the ICU.  Ronnie Bach, MD  Ronnie Dunn 17:14   ADDENDUM: I did speak with Ronnie Dunn wife on the phone.  She is very nice.  I explained to her that we have a diagnosis of myeloma.  It is hard to know whether or not the myeloma is affecting his cognitive abilities.  Typically, IgG myeloma does not cause high serum viscosity's.  I told her that we could treat the myeloma and hopefully this might help his overall mental status.  I think treatment would be highly effective.  I really do not see a downside to treating him.  He will need to have a PICC line placed.  She we will talk to the family.  However, she feels that this is some that he would want to have done.  I told her that myeloma some that we can treat but we cannot cure.  I can our goal here is to try to improve his overall mental state so he will be more alert and oriented and be able to have a decent quality of life and be able to go home.  I told her that I have no idea if/when he would go home.  I do not know if his mental state would improve.  I noted the high sodium which is a little bit troublesome.  She is very thankful for the care that he is getting by the ICU staff.  She will be in this morning and sign any consent form that needs to be signed for treatment.   Ronnie Bach, MD

## 2023-04-13 NOTE — Progress Notes (Signed)
 PT Cancellation Note  Patient Details Name: Ronnie Dunn MRN: [id number] DOB: November 14, 1960   Cancelled Treatment:    Reason Eval/Treat Not Completed: Medical issues which prohibited therapy Will check back another day when medically stable.  Ronnie Dunn PT Acute Rehabilitation Services Office [phone]    [name] 04/13/2023, 9:02 AM

## 2023-04-13 NOTE — Progress Notes (Signed)
 eLink Physician-Brief Progress Note Patient Name: Ronnie Dunn DOB: 16-May-1960 MRN: [id number]   Date of Service  04/13/2023  HPI/Events of Note  OG tube advanced 5 cm.  eICU Interventions  KUB ordered.        Migdalia Dk 04/13/2023, 10:54 PM

## 2023-04-13 NOTE — Progress Notes (Signed)
 OT Cancellation Note  Patient Details Name: BUNYAN BRIER MRN: [id number] DOB: 28-Mar-1960   Cancelled Treatment:    Reason Eval/Treat Not Completed: Medical issues which prohibited therapy: Nursing asking medical hold. Will check back Monday as able.   Theodoro Clock 04/13/2023, 11:00 AM

## 2023-04-13 NOTE — Progress Notes (Signed)
 Sputum sample obtained and sent to lab at this time.

## 2023-04-14 ENCOUNTER — Inpatient Hospital Stay (HOSPITAL_COMMUNITY)

## 2023-04-14 ENCOUNTER — Encounter (HOSPITAL_COMMUNITY): Payer: Self-pay | Admitting: Hematology & Oncology

## 2023-04-14 ENCOUNTER — Other Ambulatory Visit: Payer: Self-pay

## 2023-04-14 DIAGNOSIS — G9341 Metabolic encephalopathy: Secondary | ICD-10-CM | POA: Diagnosis not present

## 2023-04-14 DIAGNOSIS — E876 Hypokalemia: Secondary | ICD-10-CM | POA: Diagnosis not present

## 2023-04-14 DIAGNOSIS — J9601 Acute respiratory failure with hypoxia: Secondary | ICD-10-CM | POA: Diagnosis not present

## 2023-04-14 DIAGNOSIS — N179 Acute kidney failure, unspecified: Secondary | ICD-10-CM | POA: Diagnosis not present

## 2023-04-14 DIAGNOSIS — C9000 Multiple myeloma not having achieved remission: Secondary | ICD-10-CM | POA: Diagnosis not present

## 2023-04-14 DIAGNOSIS — E46 Unspecified protein-calorie malnutrition: Secondary | ICD-10-CM

## 2023-04-14 DIAGNOSIS — I10 Essential (primary) hypertension: Secondary | ICD-10-CM

## 2023-04-14 LAB — GLUCOSE, CAPILLARY
Glucose-Capillary: 212 mg/dL — ABNORMAL HIGH (ref 70–99)
Glucose-Capillary: 230 mg/dL — ABNORMAL HIGH (ref 70–99)
Glucose-Capillary: 217 mg/dL — ABNORMAL HIGH (ref 70–99)
Glucose-Capillary: 194 mg/dL — ABNORMAL HIGH (ref 70–99)
Glucose-Capillary: 133 mg/dL — ABNORMAL HIGH (ref 70–99)
Glucose-Capillary: 73 mg/dL (ref 70–99)

## 2023-04-14 LAB — CBC WITH DIFFERENTIAL/PLATELET
Abs Immature Granulocytes: 0.15 10*3/uL — ABNORMAL HIGH (ref 0.00–0.07)
Basophils Absolute: 0 10*3/uL (ref 0.0–0.1)
Basophils Relative: 0 %
Eosinophils Absolute: 0 10*3/uL (ref 0.0–0.5)
Eosinophils Relative: 0 %
HCT: 29.4 % — ABNORMAL LOW (ref 39.0–52.0)
Hemoglobin: 8.6 g/dL — ABNORMAL LOW (ref 13.0–17.0)
Immature Granulocytes: 2 %
Lymphocytes Relative: 9 %
Lymphs Abs: 0.7 10*3/uL (ref 0.7–4.0)
MCH: 31.4 pg (ref 26.0–34.0)
MCHC: 29.3 g/dL — ABNORMAL LOW (ref 30.0–36.0)
MCV: 107.3 fL — ABNORMAL HIGH (ref 80.0–100.0)
Monocytes Absolute: 0.3 10*3/uL (ref 0.1–1.0)
Monocytes Relative: 5 %
Neutro Abs: 6.4 10*3/uL (ref 1.7–7.7)
Neutrophils Relative %: 84 %
Platelets: 244 10*3/uL (ref 150–400)
RBC: 2.74 MIL/uL — ABNORMAL LOW (ref 4.22–5.81)
RDW: 18.6 % — ABNORMAL HIGH (ref 11.5–15.5)
WBC: 7.6 10*3/uL (ref 4.0–10.5)
nRBC: 0.3 % — ABNORMAL HIGH (ref 0.0–0.2)

## 2023-04-14 LAB — COMPREHENSIVE METABOLIC PANEL WITH GFR
ALT: 6 U/L (ref 0–44)
AST: 22 U/L (ref 15–41)
Albumin: <1.5 g/dL — ABNORMAL LOW (ref 3.5–5.0)
Alkaline Phosphatase: 102 U/L (ref 38–126)
Anion gap: 8 (ref 5–15)
BUN: 56 mg/dL — ABNORMAL HIGH (ref 8–23)
CO2: 21 mmol/L — ABNORMAL LOW (ref 22–32)
Calcium: 7.3 mg/dL — ABNORMAL LOW (ref 8.9–10.3)
Chloride: 116 mmol/L — ABNORMAL HIGH (ref 98–111)
Creatinine, Ser: 2.03 mg/dL — ABNORMAL HIGH (ref 0.61–1.24)
GFR, Estimated: 36 mL/min — ABNORMAL LOW (ref >60)
Glucose, Bld: 279 mg/dL — ABNORMAL HIGH (ref 70–99)
Potassium: 4.5 mmol/L (ref 3.5–5.1)
Sodium: 145 mmol/L (ref 135–145)
Total Bilirubin: 0.3 mg/dL (ref 0.0–1.2)
Total Protein: 9.3 g/dL — ABNORMAL HIGH (ref 6.5–8.1)

## 2023-04-14 LAB — LIPID PANEL
Cholesterol: 85 mg/dL (ref 0–200)
HDL: 13 mg/dL — ABNORMAL LOW (ref >40)
LDL Cholesterol: 38 mg/dL (ref 0–99)
Total CHOL/HDL Ratio: 6.5 ratio
Triglycerides: 172 mg/dL — ABNORMAL HIGH (ref <150)
VLDL: 34 mg/dL (ref 0–40)

## 2023-04-14 LAB — AMMONIA: Ammonia: 84 umol/L — ABNORMAL HIGH (ref 9–35)

## 2023-04-14 LAB — PHOSPHORUS: Phosphorus: 4.7 mg/dL — ABNORMAL HIGH (ref 2.5–4.6)

## 2023-04-14 LAB — BETA 2 MICROGLOBULIN, SERUM: Beta-2 Microglobulin: 13.5 mg/L — ABNORMAL HIGH (ref 0.6–2.4)

## 2023-04-14 LAB — MAGNESIUM
Magnesium: 1.9 mg/dL (ref 1.7–2.4)
Magnesium: 1.9 mg/dL (ref 1.7–2.4)

## 2023-04-14 MED ORDER — LACTATED RINGERS IV BOLUS
500.0000 mL | Freq: Once | INTRAVENOUS | Status: AC
  Administered 2023-04-14: 500 mL via INTRAVENOUS

## 2023-04-14 MED ORDER — MIDAZOLAM HCL 2 MG/2ML IJ SOLN: 2.0000 mg | Freq: Once | INTRAMUSCULAR | Status: AC

## 2023-04-14 MED ORDER — AMIODARONE HCL IN DEXTROSE 360-4.14 MG/200ML-% IV SOLN: 60.0000 mg/h | INTRAVENOUS | Status: AC

## 2023-04-14 MED ORDER — AMIODARONE IV BOLUS ONLY 150 MG/100ML
150.0000 mg | Freq: Once | INTRAVENOUS | Status: DC
  Filled 2023-04-14: qty 100

## 2023-04-14 MED ORDER — MIDAZOLAM HCL 2 MG/2ML IJ SOLN
2.0000 mg | Freq: Once | INTRAMUSCULAR | Status: AC
  Administered 2023-04-14: 2 mg via INTRAVENOUS
  Filled 2023-04-14: qty 2

## 2023-04-14 MED ORDER — MIDAZOLAM HCL 2 MG/2ML IJ SOLN
1.0000 mg | INTRAMUSCULAR | Status: DC | PRN
  Administered 2023-04-14: 1 mg via INTRAVENOUS

## 2023-04-14 MED ORDER — AMIODARONE LOAD VIA INFUSION
150.0000 mg | Freq: Once | INTRAVENOUS | Status: DC
  Filled 2023-04-14: qty 83.34

## 2023-04-14 MED ORDER — MIDAZOLAM HCL 2 MG/2ML IJ SOLN: 3.0000 mg | Freq: Once | INTRAMUSCULAR | Status: AC

## 2023-04-14 MED ORDER — AMIODARONE HCL IN DEXTROSE 360-4.14 MG/200ML-% IV SOLN
30.0000 mg/h | INTRAVENOUS | Status: DC
  Administered 2023-04-16 – 2023-04-19 (×7): 30 mg/h via INTRAVENOUS
  Filled 2023-04-14 (×10): qty 200

## 2023-04-14 MED ORDER — PHENYLEPHRINE HCL-NACL 20-0.9 MG/250ML-% IV SOLN
INTRAVENOUS | Status: AC
  Administered 2023-04-14: 20 mg via INTRAVENOUS
  Filled 2023-04-14: qty 250

## 2023-04-14 MED ORDER — PROSOURCE TF20 ENFIT COMPATIBL EN LIQD
60.0000 mL | Freq: Three times a day (TID) | ENTERAL | Status: DC
  Administered 2023-04-14 – 2023-04-20 (×19): 60 mL
  Filled 2023-04-14 (×18): qty 60

## 2023-04-14 MED ORDER — MIDAZOLAM HCL 2 MG/2ML IJ SOLN
INTRAMUSCULAR | Status: AC
  Administered 2023-04-14: 3 mg via INTRAVENOUS
  Filled 2023-04-14: qty 4

## 2023-04-14 MED ORDER — MIDAZOLAM HCL 2 MG/2ML IJ SOLN
INTRAMUSCULAR | Status: AC
  Administered 2023-04-14: 2 mg via INTRAVENOUS
  Filled 2023-04-14: qty 2

## 2023-04-14 MED ORDER — OSMOLITE 1.5 CAL PO LIQD
1000.0000 mL | ORAL | Status: DC
  Administered 2023-04-14 – 2023-04-20 (×7): 1000 mL
  Filled 2023-04-14 (×10): qty 1000

## 2023-04-14 MED ORDER — PHENYLEPHRINE HCL-NACL 20-0.9 MG/250ML-% IV SOLN
0.0000 ug/min | INTRAVENOUS | Status: DC
  Administered 2023-04-14: 15 ug/min via INTRAVENOUS
  Administered 2023-04-15: 25 ug/min via INTRAVENOUS
  Filled 2023-04-14: qty 250

## 2023-04-14 MED ORDER — MIDAZOLAM-SODIUM CHLORIDE 100-0.9 MG/100ML-% IV SOLN
0.5000 mg/h | INTRAVENOUS | Status: DC
  Administered 2023-04-14: 0.5 mg/h via INTRAVENOUS
  Filled 2023-04-14: qty 100

## 2023-04-14 NOTE — Progress Notes (Signed)
 Nutrition Follow-up  DOCUMENTATION CODES:   Not applicable  INTERVENTION:  - Once MRI complete, plan to restart tube feeds via OGT: Osmolite 1.5 at 55 ml/h (1320 ml per day) Prosource TF20 60 ml TID Provides 2220 kcal, 143 gm protein, 1006 ml free water daily   - FWF per CCM/MD. Currently ordered Q2H which in addition to TF would provide 3476mL/day.  NUTRITION DIAGNOSIS:   Inadequate oral intake related to inability to eat as evidenced by NPO status. *ongoing  GOAL:   Patient will meet greater than or equal to 90% of their needs *progressing, restarting TF  MONITOR:   Diet advancement, Labs, Weight trends  REASON FOR ASSESSMENT:   Other (Comment) (Cortrak)    ASSESSMENT:   63 y.o. male who has a PMH significant for COPD, OSA on CPAP, CHF, HTN, HLD, alcohol use who presented with cough, fever/chills, nausea, weakness, fatigue, dyspnea x 2 weeks. Admitted for sepsis and acute metabolic encephalopathy.  3/17 Admit 3/21 Rapid called, patient with AMS, transferred to ICU; Cortrak placed 3/23 Osmolite 1.5 started @ 20 in PM 3/25 Bone marrow biopsy; reached goal TF 3/26 Patient pulled out Cortrak; TF stopped 3/27 Intubated; OGT placed  Patient remains intubated on ventilator support MV: 11.4 L/min Temp (24hrs), Avg:99.2 F (37.3 C), Min:98.1 F (36.7 C), Max:101.2 F (38.4 C)  OGT placed yesterday and xray verified overnight. Per discussion with CCM NP, can restart tube feeds today.  However, plan for patient to get an MRI today where he will need to lay flat so plan to restart feeds after MRI.  Per RN, vent compatible MRI at [facility]. Once machine returned and MRI complete, plan to restart TF.  Can restart TF back at goal as patient was tolerating goal TF prior to putting out Cortrak.   Admit weight: 245# Current weight: 232# I&O's: +5.3L since admit  + for mild pitting generalized edema   Medications reviewed and include: Colace, Miralax,  Lactulose BID, 1mg  folic acid, MVI, 100mg  thiamine, Q2H FWF Precedex Fentanyl  Labs reviewed:  Na 145 Creatinine 2.03 HA1C 7.2 (June 2023) Blood Glucose 148-230 x24 hours   Diet Order:   Diet Order             Diet NPO time specified  Diet effective now                   EDUCATION NEEDS:  Education needs have been addressed  Skin:  Skin Assessment: Skin Integrity Issues: Skin Integrity Issues:: DTI DTI: Mid Right Back  Last BM:  3/28 - rectal tube  Height:  Ht Readings from Last 1 Encounters:  04/13/23 6' 2.02" (1.88 m)   Weight:  Wt Readings from Last 1 Encounters:  04/14/23 105.6 kg   Ideal Body Weight:  86.36 kg  BMI:  Body mass index is 29.88 kg/m.  Estimated Nutritional Needs:  Kcal:  2200-2500 kcals Protein:  130-145 grams Fluid:  >/= 2.2L    Shelle Iron RD, LDN Contact via Secure Chat.

## 2023-04-14 NOTE — Consult Note (Signed)
 [facility] Tele[facility]urology Consult Services   Patient Name:   Ronnie Dunn, Ronnie Dunn Date of Birth:   Jun 19, 1960 Identification Number:   MRN - [id number] Date of Service:   04/14/2023 21:52:59  Diagnosis:       I63.331 - Cerebrovascular accident (CVA) due to thrombosis of right posterior cerebral artery (HCCC)  Impression:      This patient is a 63 year old male with past medical history of atrial fibrillation, CHF, COPD, hypertension, hyperlipidemia, obstructive sleep apnea, tobacco use, and alcohol use hospitalized for sepsis with altered mental status recently discovered to have hyperammonemia and myeloproliferative disorder presenting for a stroke alert due to abnormal MRI.    Patient's MRI appears to demonstrate completed right PCA territory infarct with petechial hemorrhage as well as other scattered small infarcts in the right ICA distribution. He is not a candidate for thrombolytic therapy or thrombectomy given his last known well and completed infarct on MRI.    Recommend holding Xarelto for 3 to 5 days pending interval CT scan to assess petechial hemorrhage. Additionally, would recommend completing stroke evaluation to include echocardiogram, MR angiogram head and neck without contrast, A1c, lipid panel.  Our recommendations are outlined below.  Recommendations:        Stroke/Telemetry Floor       Neuro Checks       Bedside Swallow Eval       DVT Prophylaxis       IV Fluids, Normal Saline       Head of Bed 30 Degrees       Euglycemia and Avoid Hyperthermia (PRN Acetaminophen)       Hold Anticoagulation for Now       Aspirin per rectum       Antihypertensives PRN if Blood pressure is greater than 220/120 or there is a concern for End organ damage/contraindications for permissive HTN. If blood pressure is greater than 220/120 give labetalol PO or IV or Vasotec IV with a goal of 15% reduction in BP during the first 24 hours.  Sign Out:       Discussed with Primary  Attending    ------------------------------------------------------------------------------  Advanced Imaging: Advanced Imaging Deferred because:  Does not meet criteria due to being out of the 24-hour window for thrombectomy   Metrics: Last Known Well: Unknown Dispatch Time: 04/14/2023 21:47:13 Initial Response Time: 04/14/2023 21:59:11 Symptoms: ams. Initial patient interaction: 04/14/2023 22:00:42 NIHSS Assessment Completed: 04/14/2023 22:03:04 Patient is not a candidate for Thrombolytic. Thrombolytic Medical Decision: 04/14/2023 22:03:05 Patient was not deemed candidate for Thrombolytic because of following reasons: LKW outside 4.5 hr window. Marland Kitchen  MRI brain reviewed. Completed infarct in the right PCA territory with petechial hemorrhage  Primary Provider Notified of Diagnostic Impression and Management Plan on: 04/14/2023 22:12:58 Spoke With: er provider Able to Reach 04/14/2023 22:12:58    ------------------------------------------------------------------------------  History of Present Illness: Patient is a 63 year old Male.  Inpatient stroke alert was called for symptoms of ams. This patient is a 63 year old male with a past medical history of atrial fibrillation, CHF, COPD, hypertension, hyperlipidemia, obstructive sleep apnea, tobacco use, and alcohol use hospitalized for sepsis with altered mental status and recently discovered hyperammonemia and myeloproliferative disease presenting for evaluation of abnormal MRI brain. Stroke alert was called after MRI brain resulted demonstrating acute ischemic stroke. Last known well is greater than 24 hours. Patient is intubated and unable to participate in HPI or examination.   Past Medical History:      Hypertension      Diabetes  Mellitus      Atrial Fibrillation      Seizures Other PMH:  CHF  COPD unable to obtain due to:   Patient Is Obtunded/ Comatose  Medications:  Anticoagulant use:  Yes xarelto No  Antiplatelet use Reviewed EMR for current medications  Allergies:  Reviewed Allergies Unable To Obtain Due To: Patient Is Obtunded/ Comatose  Social History: Smoking: Yes Alcohol Use: Yes Unable To Obtain Due To Patient Status : Patient Is Obtunded/ Comatose  Family History:  Family History Cannot Be Obtained Because:Patient Is Obtunded/ Comatose  ROS : ROS Cannot Be Obtained Because:  Patient Is Obtunded/ Comatose  Past Surgical History: Past Surgical History Cannot Be Obtained Because: Patient Is Obtunded/ Comatose There Is No Surgical History Contributory To Today's Visit  NIHSS may not be reliable due to: Patient was intubated and paralyzed  Examination: BP(93/59), Pulse(58), 1A: Level of Consciousness - Movements to Pain + 2 1B: Ask Month and Age - Dysarthric/Intubated/ Trauma/Language Barrier + 1 1C: Blink Eyes & Squeeze Hands - Performs 0 Tasks + 2 2: Test Horizontal Extraocular Movements - Partial Gaze Palsy: Corrects with Oculocephalic Reflex + 1 3: Test Visual Fields - Patient is Bilaterally Blind + 3 4: Test Facial Palsy (Use Grimace if Obtunded) - Normal symmetry + 0 5A: Test Left Arm Motor Drift - No Effort Against Gravity + 3 5B: Test Right Arm Motor Drift - No Effort Against Gravity + 3 6A: Test Left Leg Motor Drift - No Effort Against Gravity + 3 6B: Test Right Leg Motor Drift - No Effort Against Gravity + 3 7: Test Limb Ataxia (FNF/Heel-Shin) - No Ataxia + 0 8: Test Sensation - No Response and Quadriplegic + 2 9: Test Language/Aphasia - Mute/Global Aphasia: No Usable Speech/Auditory Comprehension + 3 10: Test Dysarthria - Intubated/Unable to Test + 0 11: Test Extinction/Inattention - No abnormality + 0  NIHSS Score: 26   Pre-Morbid Modified Rankin Scale: Unable to assess  Spoke with : er provider  This consult was conducted in real time using interactive audio and Immunologist. Patient was informed of the technology being used for this visit and  agreed to proceed. Patient located in hospital and provider located at home/office setting.   Patient is being evaluated for possible acute neurologic impairment and high probability of imminent or life-threatening deterioration. I spent total of 35 minutes providing care to this patient, including time for face to face visit via telemedicine, review of medical records, imaging studies and discussion of findings with providers, the patient and/or family.   Dr Jetta Lout   [facility] For Inpatient follow-up with [facility] physician please call [facility] at [phone]. As we are not an outpatient service for any post hospital discharge needs please contact the hospital for assistance. If you have any questions for the [facility] physicians or need to reconsult for clinical or diagnostic changes please contact us via [facility] at [phone].

## 2023-04-14 NOTE — Plan of Care (Signed)
  Problem: Nutrition: Goal: Adequate nutrition will be maintained Outcome: Progressing   Problem: Elimination: Goal: Will not experience complications related to bowel motility Outcome: Progressing Goal: Will not experience complications related to urinary retention Outcome: Progressing   Problem: Education: Goal: Knowledge of General Education information will improve Description: Including pain rating scale, medication(s)/side effects and non-pharmacologic comfort measures Outcome: Not Progressing   Problem: Clinical Measurements: Goal: Respiratory complications will improve Outcome: Not Progressing Goal: Cardiovascular complication will be avoided Outcome: Not Progressing   Problem: Activity: Goal: Risk for activity intolerance will decrease Outcome: Not Progressing   Problem: Coping: Goal: Level of anxiety will decrease Outcome: Not Progressing   Problem: Pain Managment: Goal: General experience of comfort will improve and/or be controlled Outcome: Not Progressing   Problem: Skin Integrity: Goal: Risk for impaired skin integrity will decrease Outcome: Not Progressing   Problem: Skin Integrity: Goal: Risk for impaired skin integrity will decrease Outcome: Not Progressing

## 2023-04-14 NOTE — Plan of Care (Signed)
   Problem: Nutrition: Goal: Adequate nutrition will be maintained Outcome: Progressing   Problem: Coping: Goal: Level of anxiety will decrease Outcome: Progressing

## 2023-04-14 NOTE — Progress Notes (Addendum)
 eLink Physician-Brief Progress Note Patient Name: SRIHARI SHELLHAMMER DOB: [birth year] MRN: [id number]   Date of Service  04/14/2023  HPI/Events of Note  MRI of the brain shows multiple new ischemic strokes, with an area of petechial hemorrhage.  eICU Interventions  Stat neurological consult requested. ADDENDUM Per Neurology, no acute intervention (TNK or thrombectomy), hold Xarelto, standard stroke work up.        Migdalia Dk 04/14/2023, 9:48 PM

## 2023-04-14 NOTE — Progress Notes (Signed)
 eLink Physician-Brief Progress Note Patient Name: HRISHIKESH HOEG DOB: 08-03-1960 MRN: [id number]   Date of Service  04/14/2023  HPI/Events of Note  KUB reviewed.  eICU Interventions  OG tube in good position.        Thomasene Lot Marella Vanderpol 04/14/2023, 12:24 AM

## 2023-04-14 NOTE — Progress Notes (Signed)
 Unfortunately, things seemed to take a turn for the worse yesterday.  He was intubated.  He is on sedation.  His sodium was quite high.  He was started on chemotherapy yesterday for the myeloma.  We have yet to get his myeloma studies back with respect to his immunoglobulins in the monoclonal spike.  Thankfully, we were able to get chemotherapy and to him.  His blood sugar was incredibly high.  I am sure this is from the Decadron that he received.  The Velcade and Cytoxan really are not immunosuppressive.  As such, I really do not think that there should be a problem with his immune system.  Again, I do not know what is heavy chains are.  He was on pressors.  I think now he is off pressors.  He is on Rocephin.  He did have a chest x-ray a couple days ago.  This is showed some cardiomegaly.  There is no consolidation of the left base.  He did have a abdominal x-ray yesterday.  This does not show any free air.  He is also on Xarelto.  It is really hard to say what the outcome here is going to be.  I realize he does have a few other health issues.  I know that the staff in the ICU have been working hard on him to help keep him stable and to try to improve his overall status.  His electrolytes are not back yet.  His vital signs are temperature 98.3.  Pulse 85.  Blood pressure 120/64.  His lungs sound relatively clear bilaterally.  Cardiac exam is irregular rate and irrhythm.  He has occasional extra beat.  Abdomen is obese but soft.  Bowel sounds are present.  There is no guarding or obvious rebound.  Extremities shows no clubbing, cyanosis or edema.  We know that Ronnie Dunn has IgG kappa myeloma.  It is not clear as to how much this is contributing to his overall status.  Again I realize he does have other health issues.  Again it really, really would be nice to know what his immunoglobulin levels are.  At least, we got the first week of treatment into him.  We will now have to see how everything  trends.  I am very appreciative of the great care that he is getting from the ICU staff.   Ronnie Bach, MD  Psalm 118:6

## 2023-04-14 NOTE — TOC Progression Note (Signed)
 Transition of Care [facility]) - Progression Note    Patient Details  Name: Ronnie Dunn MRN: [id number] Date of Birth: 11/30/1960  [facility] CM/SW Contact  Adrian Prows, RN Phone Number: 04/14/2023, 8:59 AM  Clinical Narrative:    Pt sedated on ventilator; TOC is following.   Expected Discharge Plan: Home w Home Health Services Barriers to Discharge: Continued Medical Work up  Expected Discharge Plan and Services   Discharge Planning Services: CM Consult Post Acute Care Choice: Home Health Living arrangements for the past 2 months: Single Family Home                           HH Arranged: PT HH Agency: [facility] Date [facility] Agency Contacted: 04/06/23 Time HH Agency Contacted: 1100 Representative spoke with at [facility] Agency: Amy   Social Determinants of Health (SDOH) Interventions SDOH Screenings   Food Insecurity: No Food Insecurity (04/03/2023)  Housing: Low Risk  (04/03/2023)  Transportation Needs: No Transportation Needs (04/03/2023)  Utilities: Not At Risk (04/03/2023)  Depression (PHQ2-9): Low Risk  (08/04/2021)  Recent Concern: Depression (PHQ2-9) - Medium Risk (05/28/2021)  Social Connections: Moderately Isolated (04/03/2023)  Tobacco Use: High Risk (04/03/2023)    Readmission Risk Interventions    04/06/2023   11:05 AM 06/23/2021   11:48 AM  Readmission Risk Prevention Plan  Transportation Screening Complete Complete  PCP or Specialist Appt within 5-7 Days  Complete  PCP or Specialist Appt within 3-5 Days Complete   Home Care Screening  Complete  Medication Review (RN CM)  Complete  HRI or Home Care Consult Complete   Social Work Consult for Recovery Care Planning/Counseling Complete   Palliative Care Screening Complete   Medication Review Oceanographer) Complete

## 2023-04-14 NOTE — Progress Notes (Addendum)
 NAME:  Ronnie Dunn, MRN:  [id number], DOB:  [birth year], LOS: 11 ADMISSION DATE:  04/03/2023, CONSULTATION DATE:  04/07/23 REFERRING MD:  Jerolyn Center CHIEF COMPLAINT:  AMS   BRIEF  Ronnie Dunn is a 63 y.o. male who has a PMH as below including but not limited to COPD, OSA on CPAP, chronic diastolic CHF, HTN, HLD, PAF on Xarelto, tobacco dependence, alcohol use.  He is followed by [name] as an outpatient. He presented to [facility] ED on 3/17 with cough, fever/chills, nausea, weakness, fatigue, dyspnea x 2 weeks.  He had some confusion on and off and decreased appetite.  He had also complained of low back pain which was new and was not associated with any recent fall or trauma.  He had productive sputum, green in color.  He recently completed a course of azithromycin as an outpatient but had no relief with this.  Due to ongoing symptoms, he presented to the ED for further workup.  Initial chest x-ray showed bilateral interstitial opacities.  He was started on IV Lasix.  Renal function worsened, he had renal ultrasound on 3/19 that showed normal kidneys.  Follow-up chest x-ray showed cardiomegaly and ongoing interstitial opacities with small pleural effusions.  Diuresis was continued; however, renal function worsened further.  Was initially on vancomycin and cefepime however cefepime was stopped due to altered mental status.  This was switched to ceftriaxone on 3/19.  Overnight 3/20, he developed A-fib RVR.  He received Lopressor with improvement in heart rate.  Early a.m. 3/21 he had altered mental status and new fever to 102.9 rectally.  He was subsequently transferred to the ICU and PCCM and infectious disease were asked to see in consultation.  Upon arrival to the ICU, he is minimally responsive but is protecting his airway.  He has heart rates in the 110s to 120s, blood pressure stable.  Mucous membranes are very dry.  ABG was obtained and was normal (7.52/41/21). He has clear urine in his Foley.   Antibiotics were escalated from ceftriaxone to Zosyn.  Infectious disease has seen him and we have agreed to assess CT chest/abdomen/pelvis.  Pertinent  Medical History:  has Seizure (HCC); Hyponatremia; Alcohol use disorder, moderate, dependence (HCC); Class 1 obesity; Essential hypertension; Type 2 diabetes mellitus without complication, without long-term current use of insulin (HCC); Chronic obstructive pulmonary disease (HCC); Gastroesophageal reflux disease; Lower extremity edema; Permanent atrial fibrillation (HCC); OSA (obstructive sleep apnea); Chronic heart failure with preserved ejection fraction (HCC); Seizures (HCC); Protein-calorie malnutrition, moderate (HCC); Hypomagnesemia; Alcohol abuse; Obesity (BMI 30-39.9); Noncompliance with medication regimen; Aortic root dilation (HCC); Acquired thrombophilia (HCC); Pneumonia due to infectious organism; AKI (acute kidney injury) (HCC); Acute metabolic encephalopathy; Acute encephalopathy; Hypokalemia; Acute respiratory failure with hypoxia (HCC); 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); and Sepsis (HCC) on their problem list.  Significant Hospital Events: Including procedures, antibiotic start and stop dates in addition to other pertinent events   3/17 admit 3.19 - BNPP 800 3/20 - HFNC O2 need 3/21 - Altered but protecting airway. Vitals stable.Ronnie Dunn 7L [place] O2 need. BN 275 (down from 813) ID Consult: Fever, sepsis of unknown source--potentially due to aspiration in context of encephalopathy. Alcoholic liver disease. 3/AKI. 4Encephalopathy --presumably from alcohol withdrawal, hepatic encaphalopathy though no cirrhosis on CT [facility] in 2019 3/22 - HEme conslt - cocern for myleoma v Waldenstrom ( .  His total protein is quite high.  His calcium is quite high.  His albumin is low.) Getting seruum viscolity due to Acue  encephalpthy. Pan CT   - unremarlkable. ID thinks low odds for infection. ID recommending MRI. RN/Wife report Obutndatioin with  restlessness - on prn ativan. Last drink > 2 weeks go . Wife describes 2022 admit for Low NA - and seziure like activity but no EEG or neuro conslt in record.  Started Precedex.  Antibiotics all stopped by ID 3/23 cerebral EEG without any seizures but showing encephalopathy.  MRI brain could not be done because patient desaturated while supine. Per RN "His blood is too thick to obtain a serum protein, parathyroid, viscosity. " Ammonia 65. 3/25 bone marrow biopsy  3/27 bone marrow biopsy positive for myeloma, afternoon development of epi taxis after core track placement attempt this coupled with lethargy prompted intubation for airway protection 3/28 no issues overnight, remains sedated on ventilator, pending MRI brain  Interim History / Subjective:  Sedated on vent, no increased work of breathing Wife updated at bedside  Objective:  Blood pressure (!) 103/53, pulse 66, temperature 98.3 F (36.8 C), temperature source Axillary, resp. rate 18, height 6' 2.02" (1.88 m), weight 105.6 kg, SpO2 95%.    Vent Mode: PRVC FiO2 (%):  [40 %-100 %] 40 % Set Rate:  [16 bmp-18 bmp] 18 bmp Vt Set:  [650 mL] 650 mL PEEP:  [5 cmH20] 5 cmH20 Plateau Pressure:  [14 cmH20-17 cmH20] 16 cmH20   Intake/Output Summary (Last 24 hours) at 04/14/2023 0841 Last data filed at 04/14/2023 0981 Gross per 24 hour  Intake 4410.09 ml  Output 525 ml  Net 3885.09 ml   Filed Weights   04/12/23 0500 04/13/23 0500 04/14/23 1914  Weight: 105.5 kg 105.3 kg 105.6 kg    Examination: General: Acute on chronic ill-appearing deconditioned middle-age male lying in bed on mechanical ventilation in no acute distress HEENT: ETT, MM pink/moist, PERRL,  Neuro: Sedated on ventilator CV: s1s2 regular rate and rhythm, no murmur, rubs, or gallops,  PULM: Clear to auscultation bilaterally, no increased work of breathing, no added breath sounds GI: soft, bowel sounds active in all 4 quadrants, non-tender, non-distended, tolerating  TF Extremities: warm/dry, no edema  Skin: no rashes or lesions  Labs/imaging personally reviewed:  CT head 3/19 > neg. Renal US 3/19 > neg. CT chest/abd/pelv 3/21 > infrarenal AAA Cerebral EEG 04/08/2023: No seizures. CT chest without con 3/23 collapse consolidation left lower lobe , scattered lytic lesions  Assessment & Plan:   Acute metabolic encephalopathy  -Multifactorial 2/2 hyperammonemia, EtOH, presumed early developing sepsis of unclear etiology -CT head neg, TSH, ABG normal. -EEG 3/22 & 3/25 No seizures but shows encephalopathy. HX OF ETOH P MRI brain pending Consider formal neurology consult Neuroprotective measures Aspiration precautions Seizure precautions Continue low-dose Seroquel Minimize sedation as able Continue lactulose  Acute hypoxic respiratory failure  -Secondary to above + baseline COPD (though not on O2), OSA. -Baseline 2 L nasal cannula at night -Intubated afternoon 3/27 after development of epistaxis for airway protection Hx COPD, OSA, tobacco dependence P: Mentation currently precludes extubation given deep sedation, will wean fentanyl drip once MRI brain completed Continue empiric ceftriaxone x 5 days given new chemotherapy Continue ventilator support with lung protective strategies  Wean PEEP and FiO2 for sats greater than 90%. Head of bed elevated 30 degrees. Plateau pressures less than 30 cm H20.  Follow intermittent chest x-ray and ABG.   SAT/SBT as tolerated, mentation preclude extubation  Ensure adequate pulmonary hygiene  Follow cultures  VAP bundle in place  PAD protocol Smoking cessation education when appropriate  Newly diagnosed myeloma -Bone marrow biopsy positive for myeloma with IgG kappa myeloma seen P: Oncology following, appreciate assistance Treatment started this admission Chemotherapy precautions Optimize electrolytes  AKI -Renal function continues to wax and wane -D5W stopped and 3/28 P: Bump in creatinine this  a.m. presumed secondary to hypotension related to intubation Follow renal function  Monitor urine output Trend BMP Avoid nephrotoxins Ensure adequate renal perfusion   Hypercalcemia Hypokalemia P: Trend BMP Supplement as needed  Hypoalbuminemia Protein calorie malnutrition P: Optimize nutrition Supplement albumin as needed  Hyperviscosity  -lab unable to sample many test. -Despite patient being on Xarelto Elevated total protein in the setting of myeloma P: Calcitonin per hematology/oncology Chemotherapy as above  Hx dCHF A.fib on Xarelto Essential HTN HLD -Echo 04/08/2023: Normal left ventricle ejection fraction but right ventricular dysfunction present and is new compared to July 2024. Currently on schedled lopressr and hydralazine.  Having bradycardia with Precedex. P: Continuous telemetry  Continue Xarelto Strict intake and output  Daily weight to assess volume status Daily assessment for need to diurese  Closely monitor renal function and electrolytes  GDMT as able    Best practice (evaluated daily):  Diet/type: NPO -RD consultation for tube feeds DVT prophylaxis: DOAC  Pressure ulcer(s): pressure ulcer assessment deferred  GI prophylaxis: Pepcid Lines: N/A Foley:  N/A Code Status:  full code Last date of multidisciplinary goals of care discussion: None yet.  Updated wife at bedside  CRITICAL CARE Performed by: Abdias Hickam D. Harris   Total critical care time: 40 minutes  Critical care time was exclusive of separately billable procedures and treating other patients.  Critical care was necessary to treat or prevent imminent or life-threatening deterioration.  Critical care was time spent personally by me on the following activities: development of treatment plan with patient and/or surrogate as well as nursing, discussions with consultants, evaluation of patient's response to treatment, examination of patient, obtaining history from patient or surrogate,  ordering and performing treatments and interventions, ordering and review of laboratory studies, ordering and review of radiographic studies, pulse oximetry and re-evaluation of patient's condition.  Caydence Enck D. Harris, NP-C [facility] Personal contact information can be found on Amion  If no contact or response made please call [phone] 04/14/2023, 9:51 AM

## 2023-04-14 NOTE — Progress Notes (Addendum)
 Pt transported on ventilator to MRI and back to 1237 with no complications. Vitals stable. RN at bedside.

## 2023-04-14 NOTE — Progress Notes (Addendum)
 eLink Physician-Brief Progress Note Patient Name: Ronnie Dunn DOB: 02-11-1960 MRN: [id number]   Date of Service  04/14/2023  HPI/Events of Note  Patient developed atrial fibrillation with RVR in the context of severe agitation, he received 3 mg of Versed iv and became more sedated but his blood pressure dropped, requiring a 500 ml LR bolus and Levophed gtt while awaiting Neo gtt. Once the Neo gtt was available and hung the plan was to wean off the Levophed and titrate up Neo as needed to maintain a MAP > 65. Amiodarone gtt ordered for persistent atrial fib with RVR.  eICU Interventions  See above.        Migdalia Dk 04/14/2023, 11:28 PM

## 2023-04-15 ENCOUNTER — Inpatient Hospital Stay (HOSPITAL_COMMUNITY)

## 2023-04-15 DIAGNOSIS — I639 Cerebral infarction, unspecified: Secondary | ICD-10-CM

## 2023-04-15 DIAGNOSIS — I6389 Other cerebral infarction: Secondary | ICD-10-CM | POA: Diagnosis not present

## 2023-04-15 DIAGNOSIS — G9340 Encephalopathy, unspecified: Secondary | ICD-10-CM | POA: Diagnosis not present

## 2023-04-15 DIAGNOSIS — G9341 Metabolic encephalopathy: Secondary | ICD-10-CM | POA: Diagnosis not present

## 2023-04-15 DIAGNOSIS — N1831 Chronic kidney disease, stage 3a: Secondary | ICD-10-CM

## 2023-04-15 DIAGNOSIS — J9601 Acute respiratory failure with hypoxia: Secondary | ICD-10-CM | POA: Diagnosis not present

## 2023-04-15 DIAGNOSIS — N179 Acute kidney failure, unspecified: Secondary | ICD-10-CM | POA: Diagnosis not present

## 2023-04-15 LAB — CBC WITH DIFFERENTIAL/PLATELET
Abs Immature Granulocytes: 0.11 10*3/uL — ABNORMAL HIGH (ref 0.00–0.07)
Basophils Absolute: 0 10*3/uL (ref 0.0–0.1)
Basophils Relative: 0 %
Eosinophils Absolute: 0 10*3/uL (ref 0.0–0.5)
Eosinophils Relative: 0 %
HCT: 30.5 % — ABNORMAL LOW (ref 39.0–52.0)
Hemoglobin: 8.7 g/dL — ABNORMAL LOW (ref 13.0–17.0)
Immature Granulocytes: 2 %
Lymphocytes Relative: 14 %
Lymphs Abs: 1.1 10*3/uL (ref 0.7–4.0)
MCH: 30.1 pg (ref 26.0–34.0)
MCHC: 28.5 g/dL — ABNORMAL LOW (ref 30.0–36.0)
MCV: 105.5 fL — ABNORMAL HIGH (ref 80.0–100.0)
Monocytes Absolute: 0.6 10*3/uL (ref 0.1–1.0)
Monocytes Relative: 8 %
Neutro Abs: 5.6 10*3/uL (ref 1.7–7.7)
Neutrophils Relative %: 76 %
Platelets: 244 10*3/uL (ref 150–400)
RBC: 2.89 MIL/uL — ABNORMAL LOW (ref 4.22–5.81)
RDW: 18.6 % — ABNORMAL HIGH (ref 11.5–15.5)
WBC: 7.4 10*3/uL (ref 4.0–10.5)
nRBC: 0.3 % — ABNORMAL HIGH (ref 0.0–0.2)

## 2023-04-15 LAB — COMPREHENSIVE METABOLIC PANEL WITH GFR
ALT: 9 U/L (ref 0–44)
AST: 23 U/L (ref 15–41)
Albumin: 1.6 g/dL — ABNORMAL LOW (ref 3.5–5.0)
Alkaline Phosphatase: 104 U/L (ref 38–126)
Anion gap: 7 (ref 5–15)
BUN: 69 mg/dL — ABNORMAL HIGH (ref 8–23)
CO2: 21 mmol/L — ABNORMAL LOW (ref 22–32)
Calcium: 7 mg/dL — ABNORMAL LOW (ref 8.9–10.3)
Chloride: 118 mmol/L — ABNORMAL HIGH (ref 98–111)
Creatinine, Ser: 2.11 mg/dL — ABNORMAL HIGH (ref 0.61–1.24)
GFR, Estimated: 35 mL/min — ABNORMAL LOW (ref >60)
Glucose, Bld: 125 mg/dL — ABNORMAL HIGH (ref 70–99)
Potassium: 3.3 mmol/L — ABNORMAL LOW (ref 3.5–5.1)
Sodium: 146 mmol/L — ABNORMAL HIGH (ref 135–145)
Total Bilirubin: 0.2 mg/dL (ref 0.0–1.2)
Total Protein: 8.7 g/dL — ABNORMAL HIGH (ref 6.5–8.1)

## 2023-04-15 LAB — GLUCOSE, CAPILLARY
Glucose-Capillary: 108 mg/dL — ABNORMAL HIGH (ref 70–99)
Glucose-Capillary: 124 mg/dL — ABNORMAL HIGH (ref 70–99)
Glucose-Capillary: 161 mg/dL — ABNORMAL HIGH (ref 70–99)
Glucose-Capillary: 169 mg/dL — ABNORMAL HIGH (ref 70–99)
Glucose-Capillary: 169 mg/dL — ABNORMAL HIGH (ref 70–99)
Glucose-Capillary: 170 mg/dL — ABNORMAL HIGH (ref 70–99)
Glucose-Capillary: 177 mg/dL — ABNORMAL HIGH (ref 70–99)
Glucose-Capillary: 220 mg/dL — ABNORMAL HIGH (ref 70–99)

## 2023-04-15 LAB — PHOSPHORUS
Phosphorus: 3.5 mg/dL (ref 2.5–4.6)
Phosphorus: 2.5 mg/dL (ref 2.5–4.6)

## 2023-04-15 LAB — ECHOCARDIOGRAM LIMITED
Height: 74.016 in
Single Plane A4C EF: 65.9 %
Weight: 3841.3 [oz_av]

## 2023-04-15 LAB — MAGNESIUM
Magnesium: 2 mg/dL (ref 1.7–2.4)
Magnesium: 1.9 mg/dL (ref 1.7–2.4)

## 2023-04-15 MED ORDER — ORAL CARE MOUTH RINSE
15.0000 mL | OROMUCOSAL | Status: DC
  Administered 2023-04-15 – 2023-04-21 (×67): 15 mL via OROMUCOSAL

## 2023-04-15 MED ORDER — SODIUM CHLORIDE 0.9% FLUSH: 3.0000 mL | INTRAVENOUS | Status: DC | PRN

## 2023-04-15 MED ORDER — ASPIRIN 300 MG RE SUPP: 300.0000 mg | Freq: Every day | RECTAL | Status: DC

## 2023-04-15 MED ORDER — SODIUM CHLORIDE 0.9% FLUSH
3.0000 mL | Freq: Two times a day (BID) | INTRAVENOUS | Status: DC
Start: 2023-04-15 — End: 2023-04-21
  Administered 2023-04-15: 10 mL via INTRAVENOUS
  Administered 2023-04-16 – 2023-04-17 (×2): 3 mL via INTRAVENOUS
  Administered 2023-04-17 – 2023-04-20 (×5): 10 mL via INTRAVENOUS

## 2023-04-15 MED ORDER — THIAMINE HCL 100 MG/ML IJ SOLN: 100.0000 mg | Freq: Every day | INTRAMUSCULAR | Status: DC

## 2023-04-15 MED ORDER — POTASSIUM CHLORIDE 20 MEQ PO PACK
40.0000 meq | PACK | Freq: Once | ORAL | Status: AC
  Administered 2023-04-15: 40 meq
  Filled 2023-04-15: qty 2

## 2023-04-15 MED ORDER — ACYCLOVIR 200 MG PO CAPS
400.0000 mg | ORAL_CAPSULE | Freq: Two times a day (BID) | ORAL | Status: DC
  Administered 2023-04-15 – 2023-04-20 (×11): 400 mg
  Filled 2023-04-15 (×13): qty 2

## 2023-04-15 MED ORDER — MIDAZOLAM HCL 2 MG/2ML IJ SOLN
2.0000 mg | INTRAMUSCULAR | Status: AC | PRN
  Administered 2023-04-15 – 2023-04-20 (×9): 2 mg via INTRAVENOUS
  Filled 2023-04-15 (×9): qty 2

## 2023-04-15 MED ORDER — THIAMINE MONONITRATE 100 MG PO TABS
100.0000 mg | ORAL_TABLET | Freq: Every day | ORAL | Status: DC
  Administered 2023-04-16 – 2023-04-20 (×5): 100 mg
  Filled 2023-04-15 (×5): qty 1

## 2023-04-15 MED ORDER — FENTANYL BOLUS VIA INFUSION
50.0000 ug | INTRAVENOUS | Status: DC | PRN
  Administered 2023-04-15 – 2023-04-17 (×7): 100 ug via INTRAVENOUS
  Administered 2023-04-17: 50 ug via INTRAVENOUS
  Administered 2023-04-17 – 2023-04-18 (×18): 100 ug via INTRAVENOUS
  Administered 2023-04-19: 50 ug via INTRAVENOUS
  Administered 2023-04-19 (×8): 100 ug via INTRAVENOUS
  Administered 2023-04-19: 75 ug via INTRAVENOUS
  Administered 2023-04-19 – 2023-04-20 (×11): 100 ug via INTRAVENOUS
  Administered 2023-04-20: 50 ug via INTRAVENOUS
  Administered 2023-04-20: 100 ug via INTRAVENOUS

## 2023-04-15 MED ORDER — ORAL CARE MOUTH RINSE: 15.0000 mL | OROMUCOSAL | Status: DC | PRN

## 2023-04-15 MED ORDER — MIDAZOLAM BOLUS VIA INFUSION: 1.0000 mg | INTRAVENOUS | Status: DC | PRN

## 2023-04-15 MED ORDER — ASPIRIN 325 MG PO TABS
325.0000 mg | ORAL_TABLET | Freq: Every day | ORAL | Status: DC
  Administered 2023-04-15 – 2023-04-18 (×4): 325 mg
  Filled 2023-04-15 (×4): qty 1

## 2023-04-15 NOTE — Plan of Care (Signed)
  Problem: Education: Goal: Knowledge of General Education information will improve Description: Including pain rating scale, medication(s)/side effects and non-pharmacologic comfort measures Outcome: Not Progressing   Problem: Clinical Measurements: Goal: Respiratory complications will improve Outcome: Not Progressing Goal: Cardiovascular complication will be avoided Outcome: Not Progressing   Problem: Coping: Goal: Level of anxiety will decrease Outcome: Not Progressing

## 2023-04-15 NOTE — Progress Notes (Signed)
 NAME:  Ronnie Dunn, MRN:  [id number], DOB:  10-21-1960, LOS: 12 ADMISSION DATE:  04/03/2023, CONSULTATION DATE:  04/07/23 REFERRING MD:  Jerolyn Center CHIEF COMPLAINT:  AMS   BRIEF  Ronnie Dunn is a 63 y.o. male who has a PMH as below including but not limited to COPD, OSA on CPAP, chronic diastolic CHF, HTN, HLD, PAF on Xarelto, tobacco dependence, alcohol use.  He is followed by [name] as an outpatient. He presented to [facility] ED on 3/17 with cough, fever/chills, nausea, weakness, fatigue, dyspnea x 2 weeks.  He had some confusion on and off and decreased appetite.  He had also complained of low back pain which was new and was not associated with any recent fall or trauma.  He had productive sputum, green in color.  He recently completed a course of azithromycin as an outpatient but had no relief with this.  Due to ongoing symptoms, he presented to the ED for further workup.  Initial chest x-ray showed bilateral interstitial opacities.  He was started on IV Lasix.  Renal function worsened, he had renal ultrasound on 3/19 that showed normal kidneys.  Follow-up chest x-ray showed cardiomegaly and ongoing interstitial opacities with small pleural effusions.  Diuresis was continued; however, renal function worsened further.  Was initially on vancomycin and cefepime however cefepime was stopped due to altered mental status.  This was switched to ceftriaxone on 3/19.  Overnight 3/20, he developed A-fib RVR.  He received Lopressor with improvement in heart rate.  Early a.m. 3/21 he had altered mental status and new fever to 102.9 rectally.  He was subsequently transferred to the ICU and PCCM and infectious disease were asked to see in consultation.  Upon arrival to the ICU, he is minimally responsive but is protecting his airway.  He has heart rates in the 110s to 120s, blood pressure stable.  Mucous membranes are very dry.  ABG was obtained and was normal (7.52/41/21). He has clear urine in his Foley.   Antibiotics were escalated from ceftriaxone to Zosyn.  Infectious disease has seen him and we have agreed to assess CT chest/abdomen/pelvis.  Pertinent  Medical History:  has Seizure (HCC); Hyponatremia; Alcohol use disorder, moderate, dependence (HCC); Class 1 obesity; Essential hypertension; Type 2 diabetes mellitus without complication, without long-term current use of insulin (HCC); Chronic obstructive pulmonary disease (HCC); Gastroesophageal reflux disease; Lower extremity edema; Permanent atrial fibrillation (HCC); OSA (obstructive sleep apnea); Chronic heart failure with preserved ejection fraction (HCC); Seizures (HCC); Protein-calorie malnutrition, moderate (HCC); Hypomagnesemia; Alcohol abuse; Obesity (BMI 30-39.9); Noncompliance with medication regimen; Aortic root dilation (HCC); Acquired thrombophilia (HCC); Pneumonia due to infectious organism; AKI (acute kidney injury) (HCC); Acute metabolic encephalopathy; Acute encephalopathy; Hypokalemia; Acute respiratory failure with hypoxia (HCC); 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); and Sepsis (HCC) on their problem list.  Significant Hospital Events: Including procedures, antibiotic start and stop dates in addition to other pertinent events   3/17 admit 3.19 - BNPP 800 3/20 - HFNC O2 need 3/21 - Altered but protecting airway. Vitals stable.Marland Kitchen 7L [place] O2 need. BN 275 (down from 813) ID Consult: Fever, sepsis of unknown source--potentially due to aspiration in context of encephalopathy. Alcoholic liver disease. 3/AKI. 4Encephalopathy --presumably from alcohol withdrawal, hepatic encaphalopathy though no cirrhosis on CT at [facility] in 2019 3/22 - HEme conslt - cocern for myleoma v Waldenstrom ( .  His total protein is quite high.  His calcium is quite high.  His albumin is low.) Getting seruum viscolity due to Acue  encephalpthy. Pan CT   - unremarlkable. ID thinks low odds for infection. ID recommending MRI. RN/Wife report Obutndatioin with  restlessness - on prn ativan. Last drink > 2 weeks go . Wife describes 2022 admit for Low NA - and seziure like activity but no EEG or neuro conslt in record.  Started Precedex.  Antibiotics all stopped by ID 3/23 cerebral EEG without any seizures but showing encephalopathy.  MRI brain could not be done because patient desaturated while supine. Per RN "His blood is too thick to obtain a serum protein, parathyroid, viscosity. " Ammonia 65. 3/25 bone marrow biopsy  3/27 bone marrow biopsy positive for myeloma, afternoon development of epi taxis after core track placement attempt this coupled with lethargy prompted intubation for airway protection 3/28 no issues overnight, remains sedated on ventilator, pending MRI brain  Interim History / Subjective:  Sedated on vent, minimal vent settings, MRI revelas acute strokes R sided  Objective:  Blood pressure (!) 99/55, pulse 63, temperature 98.2 F (36.8 C), temperature source Axillary, resp. rate 18, height 6' 2.02" (1.88 m), weight 108.9 kg, SpO2 96%.    Vent Mode: PRVC FiO2 (%):  [30 %-40 %] 30 % Set Rate:  [18 bmp] 18 bmp Vt Set:  [650 mL] 650 mL PEEP:  [5 cmH20] 5 cmH20 Plateau Pressure:  [15 cmH20-21 cmH20] 16 cmH20   Intake/Output Summary (Last 24 hours) at 04/15/2023 0919 Last data filed at 04/15/2023 0719 Gross per 24 hour  Intake 2395.75 ml  Output 1100 ml  Net 1295.75 ml   Filed Weights   04/13/23 0500 04/14/23 1610 04/15/23 0708  Weight: 105.3 kg 105.6 kg 108.9 kg    Examination: General: Acute on chronic ill-appearing deconditioned middle-age male lying in bed on mechanical ventilation in no acute distress HEENT: ETT, MM pink/moist, PERRL,  Neuro: Sedated on ventilator CV: s1s2 regular rate and rhythm, no murmur, rubs, or gallops,  PULM: Clear to auscultation bilaterally, no increased work of breathing, no added breath sounds GI: soft, bowel sounds active in all 4 quadrants, non-tender, non-distended, tolerating  TF Extremities: warm/dry, no edema  Skin: no rashes or lesions  Labs/imaging personally reviewed:  CT head 3/19 > neg. Renal US 3/19 > neg. CT chest/abd/pelv 3/21 > infrarenal AAA Cerebral EEG 04/08/2023: No seizures. CT chest without con 3/23 collapse consolidation left lower lobe , scattered lytic lesions MRI 3/28 with Acute right PCA territory infarcts in the right occipital lobe with petechial hemorrhage and Additional acute infarct in the right parietal lobe and small acute infarct in the right frontal lobe  Assessment & Plan:   Acute metabolic encephalopathy  -Multifactorial 2/2 hyperammonemia, EtOH, new R sided strokes -CT head neg, TSH, ABG normal. -EEG 3/22 & 3/25 No seizures but shows encephalopathy. HX OF ETOH P Neuroprotective measures Aspiration precautions Seizure precautions Continue low-dose Seroquel Wean continuous sedatives Continue lactulose  Acute stroke R sided on MRI: Presumed embolic with afib P: Neurology consult, they are directing stroke work up ASA daily added 3/29 Wean sedation to attempt to get exam  Acute hypoxic respiratory failure  -Secondary to above + baseline COPD (though not on O2), OSA. -Baseline 2 L nasal cannula at night -Intubated afternoon 3/27 after development of epistaxis for airway protection Hx COPD, OSA, tobacco dependence P: Mentation currently precludes extubation, aggressive sedation wean Continue empiric ceftriaxone x 5 days given new chemotherapy SAT/SBT as tolerated, mentation preclude extubation   Newly diagnosed myeloma Elevated Protein -Bone marrow biopsy positive for myeloma with IgG kappa  myeloma seen P: Oncology following, appreciate assistance Treatment started this admission Chemotherapy precautions Optimize electrolytes HZV ppx started PO 3/29  AKI on CKD3a -Renal function continues to wax and wane -D5W stopped and 3/28 P: Cr overall improved to stable, unclear baseline, CKD3a 06/2022 may be new  baseline currently  Hypercalcemia (resolved) Hypokalemia P: Supplement as needed  Hypoalbuminemia Protein calorie malnutrition P: Optimize nutrition as able  Hx dCHF A.fib on Xarelto Essential HTN HLD -Echo 04/08/2023: Normal left ventricle ejection fraction but right ventricular dysfunction present and is new compared to July 2024. Currently on schedled lopressr and hydralazine.  Bradycardia with Precedex. P: Continue Xarelto Strict intake and output  Daily weight to assess volume status Daily assessment for need to diurese, appears euvolemic   Best practice (evaluated daily):  Diet/type: tubefeeds and NPO  DVT prophylaxis: DOAC  Pressure ulcer(s): pressure ulcer assessment deferred  GI prophylaxis: Pepcid Lines: N/A Foley:  N/A Code Status:  full code Last date of multidisciplinary goals of care discussion: n/a  Updated wife at bedside  CRITICAL CARE Performed by: Lesia Sago Eladio [place]ont   Total critical care time: 40 minutes  Critical care time was exclusive of separately billable procedures and treating other patients.  Critical care was necessary to treat or prevent imminent or life-threatening deterioration.  Critical care was time spent personally by me on the following activities: development of treatment plan with patient and/or surrogate as well as nursing, discussions with consultants, evaluation of patient's response to treatment, examination of patient, obtaining history from patient or surrogate, ordering and performing treatments and interventions, ordering and review of laboratory studies, ordering and review of radiographic studies, pulse oximetry and re-evaluation of patient's condition.  Karren Burly, MD [facility] Personal contact information can be found on Amion  If no contact or response made please call [phone] 04/15/2023, 9:19 AM

## 2023-04-15 NOTE — Progress Notes (Signed)
 He is still intubated.  He developed rapid A-fib.  He is on pressor right now.  I think he may also start amiodarone.  Of note, his initial beta-2 microglobulin was 13.2.  I must say this is one of the higher levels of beta-2 microglobulin I have seen.  It is usually correlates fairly well with myeloma burden.  It really would be nice to see how much IgG level he has.  I am still awaiting the myeloma panel.  Of note, his total protein has come down to 8.7.  I think when he first was admitted total protein was 12.2.  His calcium is down to his 7 with an albumin of 1.6.  His BUN and creatinine are 69 and 2.11.  This might be from pressors.  The white cell count 7.4.  Hemoglobin 8.7.  Platelet count 244,000.  He will not be due for his next dose of treatment I think next Wednesday.  He is on Rocephin empirically.  So far, cultures have been negative.  We probably need to get him on HZV prophylaxis.  Hopefully, he will start to come out of this crisis that he has.  Again I know that he does have quite a few other health issues that could be causing issues for him.  I know the atrial fibrillation certainly not helping anything.  I know this is incredibly complicated.  I have to believe that our treatment for the myeloma will work.  Typically, IgG kappa myeloma is fairly responsive to therapy.  I know the ICU staff are doing a tremendous job with him.   Christin Bach, MD  Du[facility] Heck 41:10

## 2023-04-16 DIAGNOSIS — I639 Cerebral infarction, unspecified: Secondary | ICD-10-CM | POA: Diagnosis not present

## 2023-04-16 DIAGNOSIS — G9341 Metabolic encephalopathy: Secondary | ICD-10-CM | POA: Diagnosis not present

## 2023-04-16 DIAGNOSIS — N179 Acute kidney failure, unspecified: Secondary | ICD-10-CM | POA: Diagnosis not present

## 2023-04-16 DIAGNOSIS — J9601 Acute respiratory failure with hypoxia: Secondary | ICD-10-CM | POA: Diagnosis not present

## 2023-04-16 LAB — COMPREHENSIVE METABOLIC PANEL WITH GFR
ALT: 9 U/L (ref 0–44)
AST: 20 U/L (ref 15–41)
Albumin: 1.5 g/dL — ABNORMAL LOW (ref 3.5–5.0)
Alkaline Phosphatase: 111 U/L (ref 38–126)
Anion gap: 5 (ref 5–15)
BUN: 69 mg/dL — ABNORMAL HIGH (ref 8–23)
CO2: 21 mmol/L — ABNORMAL LOW (ref 22–32)
Calcium: 7 mg/dL — ABNORMAL LOW (ref 8.9–10.3)
Chloride: 118 mmol/L — ABNORMAL HIGH (ref 98–111)
Creatinine, Ser: 1.88 mg/dL — ABNORMAL HIGH (ref 0.61–1.24)
GFR, Estimated: 40 mL/min — ABNORMAL LOW (ref >60)
Glucose, Bld: 272 mg/dL — ABNORMAL HIGH (ref 70–99)
Potassium: 4.1 mmol/L (ref 3.5–5.1)
Sodium: 144 mmol/L (ref 135–145)
Total Bilirubin: 0.4 mg/dL (ref 0.0–1.2)
Total Protein: 8.5 g/dL — ABNORMAL HIGH (ref 6.5–8.1)

## 2023-04-16 LAB — CBC WITH DIFFERENTIAL/PLATELET
Abs Immature Granulocytes: 0.03 10*3/uL (ref 0.00–0.07)
Basophils Absolute: 0 10*3/uL (ref 0.0–0.1)
Basophils Relative: 1 %
Eosinophils Absolute: 0.1 10*3/uL (ref 0.0–0.5)
Eosinophils Relative: 2 %
HCT: 29.9 % — ABNORMAL LOW (ref 39.0–52.0)
Hemoglobin: 8.7 g/dL — ABNORMAL LOW (ref 13.0–17.0)
Immature Granulocytes: 1 %
Lymphocytes Relative: 17 %
Lymphs Abs: 0.9 10*3/uL (ref 0.7–4.0)
MCH: 31 pg (ref 26.0–34.0)
MCHC: 29.1 g/dL — ABNORMAL LOW (ref 30.0–36.0)
MCV: 106.4 fL — ABNORMAL HIGH (ref 80.0–100.0)
Monocytes Absolute: 0.4 10*3/uL (ref 0.1–1.0)
Monocytes Relative: 8 %
Neutro Abs: 3.8 10*3/uL (ref 1.7–7.7)
Neutrophils Relative %: 71 %
Platelets: 232 10*3/uL (ref 150–400)
RBC: 2.81 MIL/uL — ABNORMAL LOW (ref 4.22–5.81)
RDW: 18.6 % — ABNORMAL HIGH (ref 11.5–15.5)
WBC: 5.2 10*3/uL (ref 4.0–10.5)
nRBC: 0 % (ref 0.0–0.2)

## 2023-04-16 LAB — GLUCOSE, CAPILLARY
Glucose-Capillary: 253 mg/dL — ABNORMAL HIGH (ref 70–99)
Glucose-Capillary: 241 mg/dL — ABNORMAL HIGH (ref 70–99)
Glucose-Capillary: 261 mg/dL — ABNORMAL HIGH (ref 70–99)
Glucose-Capillary: 279 mg/dL — ABNORMAL HIGH (ref 70–99)
Glucose-Capillary: 269 mg/dL — ABNORMAL HIGH (ref 70–99)
Glucose-Capillary: 266 mg/dL — ABNORMAL HIGH (ref 70–99)

## 2023-04-16 LAB — PHOSPHORUS: Phosphorus: 1.8 mg/dL — ABNORMAL LOW (ref 2.5–4.6)

## 2023-04-16 LAB — MAGNESIUM: Magnesium: 2 mg/dL (ref 1.7–2.4)

## 2023-04-16 MED ORDER — ATORVASTATIN CALCIUM 40 MG PO TABS
40.0000 mg | ORAL_TABLET | Freq: Every day | ORAL | Status: DC
  Administered 2023-04-16 – 2023-04-20 (×5): 40 mg
  Filled 2023-04-16 (×5): qty 1

## 2023-04-16 MED ORDER — METOPROLOL TARTRATE 25 MG PO TABS
25.0000 mg | ORAL_TABLET | Freq: Two times a day (BID) | ORAL | Status: DC
  Administered 2023-04-16 – 2023-04-18 (×5): 25 mg
  Filled 2023-04-16 (×5): qty 1

## 2023-04-16 MED ORDER — INSULIN GLARGINE-YFGN 100 UNIT/ML ~~LOC~~ SOLN
10.0000 [IU] | Freq: Every day | SUBCUTANEOUS | Status: DC
  Administered 2023-04-16: 10 [IU] via SUBCUTANEOUS
  Filled 2023-04-16: qty 0.1

## 2023-04-16 MED ORDER — INSULIN GLARGINE-YFGN 100 UNIT/ML ~~LOC~~ SOPN
10.0000 [IU] | PEN_INJECTOR | Freq: Every day | SUBCUTANEOUS | Status: DC
  Filled 2023-04-16: qty 3

## 2023-04-16 MED ORDER — SODIUM PHOSPHATES 45 MMOLE/15ML IV SOLN
30.0000 mmol | Freq: Once | INTRAVENOUS | Status: AC
  Administered 2023-04-16: 30 mmol via INTRAVENOUS
  Filled 2023-04-16: qty 10

## 2023-04-16 MED ORDER — AMIODARONE LOAD VIA INFUSION
150.0000 mg | Freq: Once | INTRAVENOUS | Status: AC
  Administered 2023-04-16: 150 mg via INTRAVENOUS
  Filled 2023-04-16: qty 83.34

## 2023-04-16 NOTE — Plan of Care (Signed)

## 2023-04-16 NOTE — Progress Notes (Signed)
 PATIENT WAS PLACED BACK ON FULL SUPPORT (PRVC) DUE TO INCREASED WORK OF BREATHING.

## 2023-04-16 NOTE — Progress Notes (Signed)
 Pharmacy Electrolyte Replacement  Recent Labs:  Recent Labs    04/16/23 0525  K 4.1  MG 2.0  PHOS 1.8*  CREATININE 1.88*    Low Critical Values (K </= 2.5, Phos </= 1, Mg </= 1) Present: None  MD Contacted: N/A  Plan:  Sodium Phosphate 30 mmol IV x 1 Recheck BMET, magnesium, and phosphorus level in am   Thank you for allowing pharmacy to be a part of this patient's care.  Selinda Eon, PharmD, BCPS Clinical Pharmacist [place] 04/16/2023 9:20 AM

## 2023-04-16 NOTE — Progress Notes (Signed)
 NAME:  JANICE SEALES, MRN:  [id number], DOB:  1960/09/09, LOS: 13 ADMISSION DATE:  04/03/2023, CONSULTATION DATE:  04/07/23 REFERRING MD:  Jerolyn Center CHIEF COMPLAINT:  AMS   BRIEF  HERBERTH DEHARO is a 63 y.o. male who has a PMH as below including but not limited to COPD, OSA on CPAP, chronic diastolic CHF, HTN, HLD, PAF on Xarelto, tobacco dependence, alcohol use.  He is followed by [name] as an outpatient. He presented to [facility] ED on 3/17 with cough, fever/chills, nausea, weakness, fatigue, dyspnea x 2 weeks.  He had some confusion on and off and decreased appetite.  He had also complained of low back pain which was new and was not associated with any recent fall or trauma.  He had productive sputum, green in color.  He recently completed a course of azithromycin as an outpatient but had no relief with this.  Due to ongoing symptoms, he presented to the ED for further workup.  Initial chest x-ray showed bilateral interstitial opacities.  He was started on IV Lasix.  Renal function worsened, he had renal ultrasound on 3/19 that showed normal kidneys.  Follow-up chest x-ray showed cardiomegaly and ongoing interstitial opacities with small pleural effusions.  Diuresis was continued; however, renal function worsened further.  Was initially on vancomycin and cefepime however cefepime was stopped due to altered mental status.  This was switched to ceftriaxone on 3/19.  Overnight 3/20, he developed A-fib RVR.  He received Lopressor with improvement in heart rate.  Early a.m. 3/21 he had altered mental status and new fever to 102.9 rectally.  He was subsequently transferred to the ICU and PCCM and infectious disease were asked to see in consultation.  Upon arrival to the ICU, he is minimally responsive but is protecting his airway.  He has heart rates in the 110s to 120s, blood pressure stable.  Mucous membranes are very dry.  ABG was obtained and was normal (7.52/41/21). He has clear urine in his Foley.   Antibiotics were escalated from ceftriaxone to Zosyn.  Infectious disease has seen him and we have agreed to assess CT chest/abdomen/pelvis.  Pertinent  Medical History:  has Seizure (HCC); Hyponatremia; Alcohol use disorder, moderate, dependence (HCC); Class 1 obesity; Essential hypertension; Type 2 diabetes mellitus without complication, without long-term current use of insulin (HCC); Chronic obstructive pulmonary disease (HCC); Gastroesophageal reflux disease; Lower extremity edema; Permanent atrial fibrillation (HCC); OSA (obstructive sleep apnea); Chronic heart failure with preserved ejection fraction (HCC); Seizures (HCC); Protein-calorie malnutrition, moderate (HCC); Hypomagnesemia; Alcohol abuse; Obesity (BMI 30-39.9); Noncompliance with medication regimen; Aortic root dilation (HCC); Acquired thrombophilia (HCC); Pneumonia due to infectious organism; AKI (acute kidney injury) (HCC); Acute metabolic encephalopathy; Acute encephalopathy; Hypokalemia; Acute respiratory failure with hypoxia (HCC); 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); and Sepsis (HCC) on their problem list.  Significant Hospital Events: Including procedures, antibiotic start and stop dates in addition to other pertinent events   3/17 admit 3.19 - BNPP 800 3/20 - HFNC O2 need 3/21 - Altered but protecting airway. Vitals stable.Marland Kitchen 7L [place] O2 need. BN 275 (down from 813) ID Consult: Fever, sepsis of unknown source--potentially due to aspiration in context of encephalopathy. Alcoholic liver disease. 3/AKI. 4Encephalopathy --presumably from alcohol withdrawal, hepatic encaphalopathy though no cirrhosis on CT at [facility] in 2019 3/22 - HEme conslt - cocern for myleoma v Waldenstrom ( .  His total protein is quite high.  His calcium is quite high.  His albumin is low.) Getting seruum viscolity due to Acue  encephalpthy. Pan CT   - unremarlkable. ID thinks low odds for infection. ID recommending MRI. RN/Wife report Obutndatioin with  restlessness - on prn ativan. Last drink > 2 weeks go . Wife describes 2022 admit for Low NA - and seziure like activity but no EEG or neuro conslt in record.  Started Precedex.  Antibiotics all stopped by ID 3/23 cerebral EEG without any seizures but showing encephalopathy.  MRI brain could not be done because patient desaturated while supine. Per RN "His blood is too thick to obtain a serum protein, parathyroid, viscosity. " Ammonia 65. 3/25 bone marrow biopsy  3/27 bone marrow biopsy positive for myeloma, afternoon development of epi taxis after core track placement attempt this coupled with lethargy prompted intubation for airway protection 3/28 no issues overnight, remains sedated on ventilator, pending MRI brain  Interim History / Subjective:  Weaned sedation, TTE with worsened R sided failure likely related to positive pressure ventilation and proceeding episodes of hypoxemia, adding statin, weaning sedation getting Vt 800 cc on PSV 5/5, Afib worse with PSV and sedation wean  Objective:  Blood pressure (!) 105/53, pulse 79, temperature (!) 101.6 F (38.7 C), temperature source Axillary, resp. rate (!) 21, height 6' 2.02" (1.88 m), weight 112.2 kg, SpO2 93%.    Vent Mode: PRVC FiO2 (%):  [30 %] 30 % Set Rate:  [18 bmp] 18 bmp Vt Set:  [650 mL] 650 mL PEEP:  [5 cmH20] 5 cmH20 Plateau Pressure:  [13 cmH20-17 cmH20] 16 cmH20   Intake/Output Summary (Last 24 hours) at 04/16/2023 0827 Last data filed at 04/16/2023 0700 Gross per 24 hour  Intake 2152.35 ml  Output 2150 ml  Net 2.35 ml   Filed Weights   04/14/23 1914 04/15/23 0708 04/16/23 0131  Weight: 105.6 kg 108.9 kg 112.2 kg    Examination: General: Acute on chronic ill-appearing deconditioned middle-age male lying in bed on mechanical ventilation in no acute distress HEENT: ETT, MM pink/moist, PERRL,  Neuro: briefly opens eyes to sternal rub, withdraws to pain on right and LUE nor LLE, later seen moving all 4 ext  semi-purposeful CV: s1s2 regular rate and rhythm, no murmur, rubs, or gallops,  PULM: Clear to auscultation bilaterally, no increased work of breathing, no added breath sounds GI: soft, bowel sounds active in all 4 quadrants, non-tender, non-distended, tolerating TF Extremities: warm/dry, no edema  Skin: no rashes or lesions  Labs/imaging personally reviewed:  CT head 3/19 > neg. Renal US 3/19 > neg. CT chest/abd/pelv 3/21 > infrarenal AAA Cerebral EEG 04/08/2023: No seizures. CT chest without con 3/23 collapse consolidation left lower lobe , scattered lytic lesions MRI 3/28 with Acute right PCA territory infarcts in the right occipital lobe with petechial hemorrhage and Additional acute infarct in the right parietal lobe and small acute infarct in the right frontal lobe  Assessment & Plan:   Acute metabolic encephalopathy  -Multifactorial 2/2 hyperammonemia, EtOH, new R sided strokes -CT head neg, TSH, ABG normal. -EEG 3/22 & 3/25 No seizures but shows encephalopathy. HX OF ETOH P Neuroprotective measures Aspiration precautions Seizure precautions Continue low-dose Seroquel Wean continuous sedatives Continue lactulose  Acute stroke R sided on MRI: Presumed embolic with afib given multiple vascular territories but wonder if yeloma main issue as reports good adherence to xarelto at home P: Neurology consulted, they are directing stroke work up ASA daily added 3/29, high intensity statin 3/30 Wean sedation to attempt to get exam  Acute hypoxic respiratory failure  -Secondary to above + baseline  COPD (though not on O2), OSA.  -Baseline 2 L nasal cannula at night -Intubated afternoon 3/27 after development of epistaxis for airway protection Hx COPD, OSA, tobacco dependence P: Mentation currently precludes extubation, aggressive sedation wean with improvement but not ready Continue empiric ceftriaxone x 5 days given new chemotherapy SAT/SBT as tolerated, mentation preclude  extubation, tolerating PSV 5/5 with excellent tidal volumes  Newly diagnosed myeloma Elevated Protein -Bone marrow biopsy positive for myeloma with IgG kappa myeloma seen P: Oncology following, appreciate assistance Treatment started this admission Chemotherapy precautions Optimize electrolytes HZV ppx started PO 3/29  AKI on CKD3a -Renal function continues to wax and wane -D5W stopped and 3/28 P: Cr overall improved to stable, unclear baseline, CKD3a 06/2022 may be new baseline currently  Hypercalcemia (resolved) Hypokalemia P: Supplement as needed  Hypoalbuminemia Protein calorie malnutrition P: Optimize nutrition as able  Hx dCHF A.fib on Xarelto Essential HTN HLD -Echo 04/08/2023: Normal left ventricle ejection fraction but right ventricular dysfunction present and is new compared to July 2024 likely related to positive pressure ventilation and L sided disease and chronic hypoxemia -Bradycardia with Precedex earlier in course P: Hold Xarelto with recent stroke and petechial hemorrhage Strict intake and output  Daily weight to assess volume status Daily assessment for need to diurese, appears euvolemic Metoprolol 25 mg BID, amio bolus and resume amio gtt with worsened RVR with sedation wean and PSV   Best practice (evaluated daily):  Diet/type: tubefeeds and NPO  DVT prophylaxis: DOAC  Pressure ulcer(s): pressure ulcer assessment deferred  GI prophylaxis: Pepcid Lines: N/A Foley:  N/A Code Status:  full code Last date of multidisciplinary goals of care discussion: n/a  Updated wife at bedside  CRITICAL CARE Performed by: Lesia Sago Amora Sheehy   Total critical care time: 33 minutes  Critical care time was exclusive of separately billable procedures and treating other patients.  Critical care was necessary to treat or prevent imminent or life-threatening deterioration.  Critical care was time spent personally by me on the following activities: development  of treatment plan with patient and/or surrogate as well as nursing, discussions with consultants, evaluation of patient's response to treatment, examination of patient, obtaining history from patient or surrogate, ordering and performing treatments and interventions, ordering and review of laboratory studies, ordering and review of radiographic studies, pulse oximetry and re-evaluation of patient's condition.  Karren Burly, MD [facility] Personal contact information can be found on Amion  If no contact or response made please call [phone] 04/16/2023, 8:27 AM

## 2023-04-16 NOTE — Plan of Care (Signed)
  Problem: Education: Goal: Knowledge of General Education information will improve Description: Including pain rating scale, medication(s)/side effects and non-pharmacologic comfort measures Outcome: Progressing   Problem: Health Behavior/Discharge Planning: Goal: Ability to manage health-related needs will improve Outcome: Progressing   Problem: Clinical Measurements: Goal: Ability to maintain clinical measurements within normal limits will improve Outcome: Progressing Goal: Will remain free from infection Outcome: Progressing Goal: Diagnostic test results will improve Outcome: Progressing Goal: Respiratory complications will improve Outcome: Progressing Goal: Cardiovascular complication will be avoided Outcome: Progressing   Problem: Activity: Goal: Risk for activity intolerance will decrease Outcome: Progressing   Problem: Nutrition: Goal: Adequate nutrition will be maintained Outcome: Progressing   Problem: Coping: Goal: Level of anxiety will decrease Outcome: Progressing   Problem: Elimination: Goal: Will not experience complications related to bowel motility Outcome: Progressing Goal: Will not experience complications related to urinary retention Outcome: Progressing   Problem: Pain Managment: Goal: General experience of comfort will improve and/or be controlled Outcome: Progressing   Problem: Safety: Goal: Ability to remain free from injury will improve Outcome: Progressing   Problem: Skin Integrity: Goal: Risk for impaired skin integrity will decrease Outcome: Progressing   Problem: Education: Goal: Ability to demonstrate management of disease process will improve Outcome: Progressing Goal: Ability to verbalize understanding of medication therapies will improve Outcome: Progressing Goal: Individualized Educational Video(s) Outcome: Progressing   Problem: Activity: Goal: Capacity to carry out activities will improve Outcome: Progressing    Problem: Cardiac: Goal: Ability to achieve and maintain adequate cardiopulmonary perfusion will improve Outcome: Progressing   Problem: Education: Goal: Ability to describe self-care measures that may prevent or decrease complications (Diabetes Survival Skills Education) will improve Outcome: Progressing Goal: Individualized Educational Video(s) Outcome: Progressing   Problem: Coping: Goal: Ability to adjust to condition or change in health will improve Outcome: Progressing   Problem: Fluid Volume: Goal: Ability to maintain a balanced intake and output will improve Outcome: Progressing   Problem: Health Behavior/Discharge Planning: Goal: Ability to identify and utilize available resources and services will improve Outcome: Progressing Goal: Ability to manage health-related needs will improve Outcome: Progressing   Problem: Metabolic: Goal: Ability to maintain appropriate glucose levels will improve Outcome: Progressing   Problem: Nutritional: Goal: Maintenance of adequate nutrition will improve Outcome: Progressing Goal: Progress toward achieving an optimal weight will improve Outcome: Progressing   Problem: Skin Integrity: Goal: Risk for impaired skin integrity will decrease Outcome: Progressing   Problem: Tissue Perfusion: Goal: Adequacy of tissue perfusion will improve Outcome: Progressing   Problem: Activity: Goal: Ability to tolerate increased activity will improve Outcome: Progressing   Problem: Respiratory: Goal: Ability to maintain a clear airway and adequate ventilation will improve Outcome: Progressing   Problem: Role Relationship: Goal: Method of communication will improve Outcome: Progressing   Problem: Safety: Goal: Non-violent Restraint(s) Outcome: Progressing

## 2023-04-17 ENCOUNTER — Inpatient Hospital Stay (HOSPITAL_COMMUNITY)

## 2023-04-17 ENCOUNTER — Encounter (HOSPITAL_COMMUNITY): Payer: Self-pay | Admitting: Hematology & Oncology

## 2023-04-17 DIAGNOSIS — N1831 Chronic kidney disease, stage 3a: Secondary | ICD-10-CM | POA: Diagnosis not present

## 2023-04-17 DIAGNOSIS — G9340 Encephalopathy, unspecified: Secondary | ICD-10-CM | POA: Diagnosis not present

## 2023-04-17 DIAGNOSIS — E876 Hypokalemia: Secondary | ICD-10-CM | POA: Diagnosis not present

## 2023-04-17 DIAGNOSIS — J9601 Acute respiratory failure with hypoxia: Secondary | ICD-10-CM | POA: Diagnosis not present

## 2023-04-17 DIAGNOSIS — G9341 Metabolic encephalopathy: Secondary | ICD-10-CM | POA: Diagnosis not present

## 2023-04-17 LAB — BASIC METABOLIC PANEL WITH GFR
Anion gap: 5 (ref 5–15)
BUN: 64 mg/dL — ABNORMAL HIGH (ref 8–23)
CO2: 21 mmol/L — ABNORMAL LOW (ref 22–32)
Calcium: 6.8 mg/dL — ABNORMAL LOW (ref 8.9–10.3)
Chloride: 118 mmol/L — ABNORMAL HIGH (ref 98–111)
Creatinine, Ser: 1.66 mg/dL — ABNORMAL HIGH (ref 0.61–1.24)
GFR, Estimated: 46 mL/min — ABNORMAL LOW (ref >60)
Glucose, Bld: 296 mg/dL — ABNORMAL HIGH (ref 70–99)
Potassium: 4.3 mmol/L (ref 3.5–5.1)
Sodium: 144 mmol/L (ref 135–145)

## 2023-04-17 LAB — CULTURE, RESPIRATORY W GRAM STAIN

## 2023-04-17 LAB — CBC
HCT: 28.9 % — ABNORMAL LOW (ref 39.0–52.0)
Hemoglobin: 8.5 g/dL — ABNORMAL LOW (ref 13.0–17.0)
MCH: 30.4 pg (ref 26.0–34.0)
MCHC: 29.4 g/dL — ABNORMAL LOW (ref 30.0–36.0)
MCV: 103.2 fL — ABNORMAL HIGH (ref 80.0–100.0)
Platelets: 254 10*3/uL (ref 150–400)
RBC: 2.8 MIL/uL — ABNORMAL LOW (ref 4.22–5.81)
RDW: 18.7 % — ABNORMAL HIGH (ref 11.5–15.5)
WBC: 5.9 10*3/uL (ref 4.0–10.5)
nRBC: 0 % (ref 0.0–0.2)

## 2023-04-17 LAB — MULTIPLE MYELOMA PANEL, SERUM
Albumin SerPl Elph-Mcnc: 1.9 g/dL — ABNORMAL LOW (ref 2.9–4.4)
Albumin/Glob SerPl: 0.4 — ABNORMAL LOW (ref 0.7–1.7)
Alpha 1: 0.3 g/dL (ref 0.0–0.4)
Alpha2 Glob SerPl Elph-Mcnc: 1 g/dL (ref 0.4–1.0)
B-Globulin SerPl Elph-Mcnc: 0.8 g/dL (ref 0.7–1.3)
Gamma Glob SerPl Elph-Mcnc: 3.9 g/dL — ABNORMAL HIGH (ref 0.4–1.8)
Globulin, Total: 6 g/dL — ABNORMAL HIGH (ref 2.2–3.9)
IgA: 134 mg/dL (ref 61–437)
IgG (Immunoglobin G), Serum: 5051 mg/dL — ABNORMAL HIGH (ref 603–1613)
IgM (Immunoglobulin M), Srm: 36 mg/dL (ref 20–172)
M Protein SerPl Elph-Mcnc: 3.4 g/dL — ABNORMAL HIGH
Total Protein ELP: 7.9 g/dL (ref 6.0–8.5)

## 2023-04-17 LAB — GLUCOSE, CAPILLARY
Glucose-Capillary: 289 mg/dL — ABNORMAL HIGH (ref 70–99)
Glucose-Capillary: 240 mg/dL — ABNORMAL HIGH (ref 70–99)
Glucose-Capillary: 228 mg/dL — ABNORMAL HIGH (ref 70–99)
Glucose-Capillary: 275 mg/dL — ABNORMAL HIGH (ref 70–99)
Glucose-Capillary: 284 mg/dL — ABNORMAL HIGH (ref 70–99)
Glucose-Capillary: 293 mg/dL — ABNORMAL HIGH (ref 70–99)

## 2023-04-17 LAB — MRSA NEXT GEN BY PCR, NASAL: MRSA by PCR Next Gen: NOT DETECTED

## 2023-04-17 LAB — MAGNESIUM: Magnesium: 1.8 mg/dL (ref 1.7–2.4)

## 2023-04-17 LAB — VISCOSITY, SERUM: Viscosity, Serum: 2.7 rel.saline — ABNORMAL HIGH (ref 1.4–2.1)

## 2023-04-17 LAB — PHOSPHORUS: Phosphorus: 2.3 mg/dL — ABNORMAL LOW (ref 2.5–4.6)

## 2023-04-17 MED ORDER — INSULIN GLARGINE-YFGN 100 UNIT/ML ~~LOC~~ SOLN
12.0000 [IU] | Freq: Every day | SUBCUTANEOUS | Status: DC
  Administered 2023-04-17: 12 [IU] via SUBCUTANEOUS
  Filled 2023-04-17: qty 0.12

## 2023-04-17 MED ORDER — VANCOMYCIN HCL 2000 MG/400ML IV SOLN
2000.0000 mg | Freq: Once | INTRAVENOUS | Status: AC
  Administered 2023-04-17: 2000 mg via INTRAVENOUS
  Filled 2023-04-17: qty 400

## 2023-04-17 MED ORDER — K PHOS MONO-SOD PHOS DI & MONO 155-852-130 MG PO TABS
500.0000 mg | ORAL_TABLET | ORAL | Status: AC
  Administered 2023-04-17 (×3): 500 mg
  Filled 2023-04-17 (×3): qty 2

## 2023-04-17 MED ORDER — INSULIN ASPART 100 UNIT/ML IJ SOLN
0.0000 [IU] | INTRAMUSCULAR | Status: DC
  Administered 2023-04-17 (×2): 8 [IU] via SUBCUTANEOUS
  Administered 2023-04-17: 5 [IU] via SUBCUTANEOUS
  Administered 2023-04-17 – 2023-04-18 (×2): 8 [IU] via SUBCUTANEOUS
  Administered 2023-04-18: 11 [IU] via SUBCUTANEOUS

## 2023-04-17 MED ORDER — SODIUM CHLORIDE 0.9 % IV SOLN
2.0000 g | Freq: Three times a day (TID) | INTRAVENOUS | Status: DC
  Filled 2023-04-17: qty 12.5

## 2023-04-17 MED ORDER — PIPERACILLIN-TAZOBACTAM 3.375 G IVPB
3.3750 g | Freq: Three times a day (TID) | INTRAVENOUS | Status: DC
  Administered 2023-04-17 – 2023-04-18 (×3): 3.375 g via INTRAVENOUS
  Filled 2023-04-17 (×3): qty 50

## 2023-04-17 MED ORDER — MAGNESIUM SULFATE 2 GM/50ML IV SOLN
2.0000 g | Freq: Once | INTRAVENOUS | Status: AC
  Administered 2023-04-17: 2 g via INTRAVENOUS
  Filled 2023-04-17: qty 50

## 2023-04-17 NOTE — Progress Notes (Addendum)
 First rounds, no SBT attempt- RN working on sedation- PT unresponsive with no gag reflex to ETT suctionig. Second round, attempted to wean PT on PS of 15cm- PT became  air hungry and was placed back on full support - prior to air hunger PT had good MVe.  RN aware. PT still does not have a reaction to airway  suctioning. Third round, PT has received increased in sedation meds for agitation.

## 2023-04-17 NOTE — Progress Notes (Signed)
 PT Cancellation Note  Patient Details Name: Ronnie Dunn MRN: [id number] DOB: [birth year]   Cancelled Treatment:    Reason Eval/Treat Not Completed: Medical issues which prohibited therapy Remains sedated on ventilator. Blanchard Kelch PT Acute Rehabilitation Services Office [phone]    [name] 04/17/2023, 7:28 AM

## 2023-04-17 NOTE — Progress Notes (Signed)
 OT Cancellation Note  Patient Details Name: Ronnie Dunn MRN: [id number] DOB: 10-Jan-1961   Cancelled Treatment:    Reason Eval/Treat Not Completed: Medical issues which prohibited therapy Patient remains sedated and intubated at this time. OT to continue to follow and check back on 4/1 for medical appropriateness.  Rosalio Loud, MS Acute Rehabilitation Department Office# [phone]  04/17/2023, 7:36 AM

## 2023-04-17 NOTE — Progress Notes (Signed)
 Mr. Highley is still intubated.  I know that they are trying to wean him off.  So far, this has not yet happened.  He is on amiodarone right now for the atrial fibrillation.  He still having temperatures.  I probably switch the ceftriaxone out.  I think Merrem would not be a bad idea for him.  I probably would check cultures.  I probably do another chest x-ray.  His labs showing sodium 144.  Potassium 4.3.  BUN 64 creatinine 1.66.  Calcium 6.8 his last albumin was 1.5.  His white cell count 5.2.  Hemoglobin 8.7.  Platelet count 232,000.  I would not transfuse him right now.  He is not on pressors.  His blood pressures he has been doing pretty well.  It is hard to say what kind of mental status he will have to try to extubate.  He has had no bleeding.  I am still waiting the myeloma panel.  Hopefully, we will know what the immunoglobulin levels are.  Thankfully his renal function seems to be stabilizing a little bit.  His latest temperature is 101.5.  Pulse 102.  Blood pressure 137/63.  His lungs sound pretty clear bilaterally.  He has decent air movement bilaterally.  I do not hear any obvious wheezes.  Cardiac exam is tachycardic and somewhat irregular.  Abdomen is obese.  Bowel sounds are decreased but present.  He has no fluid wave.  There is no palpable liver or spleen tip.  Extremity shows no clubbing, cyanosis or edema.  Neurological exam shows no focal deficits.  I think he is somewhat sedated.  We are dealing with this myeloma.  I know that he does have other issues.  Hopefully, we will be able to get him back to where he is functional and able to talk to [place].  I know the ICU staff are doing a tremendous job with him.  This is incredibly complicated.  Christin Bach, MD  Psalm 16:1

## 2023-04-17 NOTE — Progress Notes (Addendum)
 NAME:  Ronnie Dunn, MRN:  [id number], DOB:  11-11-1960, LOS: 14 ADMISSION DATE:  04/03/2023, CONSULTATION DATE:  04/07/23 REFERRING MD:  Jerolyn Center CHIEF COMPLAINT:  AMS   BRIEF  Ronnie Dunn is a 63 y.o. male who has a PMH as below including but not limited to COPD, OSA on CPAP, chronic diastolic CHF, HTN, HLD, PAF on Xarelto, tobacco dependence, alcohol use.  He is followed by [name] as an outpatient. He presented to [facility] ED on 3/17 with cough, fever/chills, nausea, weakness, fatigue, dyspnea x 2 weeks.  He had some confusion on and off and decreased appetite.  He had also complained of low back pain which was new and was not associated with any recent fall or trauma.  He had productive sputum, green in color.  He recently completed a course of azithromycin as an outpatient but had no relief with this.  Due to ongoing symptoms, he presented to the ED for further workup.  Initial chest x-ray showed bilateral interstitial opacities.  He was started on IV Lasix.  Renal function worsened, he had renal ultrasound on 3/19 that showed normal kidneys.  Follow-up chest x-ray showed cardiomegaly and ongoing interstitial opacities with small pleural effusions.  Diuresis was continued; however, renal function worsened further.  Was initially on vancomycin and cefepime however cefepime was stopped due to altered mental status.  This was switched to ceftriaxone on 3/19.  Overnight 3/20, he developed A-fib RVR.  He received Lopressor with improvement in heart rate.  Early a.m. 3/21 he had altered mental status and new fever to 102.9 rectally.  He was subsequently transferred to the ICU and PCCM and infectious disease were asked to see in consultation.  Upon arrival to the ICU, he is minimally responsive but is protecting his airway.  He has heart rates in the 110s to 120s, blood pressure stable.  Mucous membranes are very dry.  ABG was obtained and was normal (7.52/41/21). He has clear urine in his Foley.   Antibiotics were escalated from ceftriaxone to Zosyn.  Infectious disease has seen him and we have agreed to assess CT chest/abdomen/pelvis.  Pertinent  Medical History:  has Seizure (HCC); Hyponatremia; Alcohol use disorder, moderate, dependence (HCC); Class 1 obesity; Essential hypertension; Type 2 diabetes mellitus without complication, without long-term current use of insulin (HCC); Chronic obstructive pulmonary disease (HCC); Gastroesophageal reflux disease; Lower extremity edema; Permanent atrial fibrillation (HCC); OSA (obstructive sleep apnea); Chronic heart failure with preserved ejection fraction (HCC); Seizures (HCC); Protein-calorie malnutrition, moderate (HCC); Hypomagnesemia; Alcohol abuse; Obesity (BMI 30-39.9); Noncompliance with medication regimen; Aortic root dilation (HCC); Acquired thrombophilia (HCC); Pneumonia due to infectious organism; AKI (acute kidney injury) (HCC); Acute metabolic encephalopathy; Acute encephalopathy; Hypokalemia; Acute respiratory failure with hypoxia (HCC); 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); and Sepsis (HCC) on their problem list.  Significant Hospital Events: Including procedures, antibiotic start and stop dates in addition to other pertinent events   3/17 admit 3.19 - BNPP 800 3/20 - HFNC O2 need 3/21 - Altered but protecting airway. Vitals stable.Marland Kitchen 7L [place] O2 need. BN 275 (down from 813) ID Consult: Fever, sepsis of unknown source--potentially due to aspiration in context of encephalopathy. Alcoholic liver disease. 3/AKI. 4Encephalopathy --presumably from alcohol withdrawal, hepatic encaphalopathy though no cirrhosis on CT [facility] in 2019 3/22 - HEme conslt - cocern for myleoma v Waldenstrom ( .  His total protein is quite high.  His calcium is quite high.  His albumin is low.) Getting seruum viscolity due to Acue  encephalpthy. Pan CT   - unremarlkable. ID thinks low odds for infection. ID recommending MRI. RN/Wife report Obutndatioin with  restlessness - on prn ativan. Last drink > 2 weeks go . Wife describes 2022 admit for Low NA - and seziure like activity but no EEG or neuro conslt in record.  Started Precedex.  Antibiotics all stopped by ID 3/23 cerebral EEG without any seizures but showing encephalopathy.  MRI brain could not be done because patient desaturated while supine. Per RN "His blood is too thick to obtain a serum protein, parathyroid, viscosity. " Ammonia 65. 3/25 bone marrow biopsy  3/27 bone marrow biopsy positive for myeloma, afternoon development of epi taxis after core track placement attempt this coupled with lethargy prompted intubation for airway protection 3/28 no issues overnight, remains sedated on ventilator, pending MRI brain  Interim History / Subjective:  Overnight fever spikes tmax 101.5 F; cultures obtained. CXR showing increasing infiltrate on RLL. Patient did have episode of vomiting overnight and TF held.  Remains on vent On 50 mcg fent; minimal response to stimuli; opens eyes to stimulation; perrl  Objective:  Blood pressure 137/63, pulse (!) 102, temperature (!) 101.5 F (38.6 C), resp. rate 17, height 6' 2.02" (1.88 m), weight 111.5 kg, SpO2 92%.    Vent Mode: PRVC FiO2 (%):  [30 %] 30 % Set Rate:  [18 bmp] 18 bmp Vt Set:  [650 mL] 650 mL PEEP:  [5 cmH20] 5 cmH20 Pressure Support:  [5 cmH20] 5 cmH20 Plateau Pressure:  [15 cmH20-16 cmH20] 15 cmH20   Intake/Output Summary (Last 24 hours) at 04/17/2023 0716 Last data filed at 04/17/2023 1610 Gross per 24 hour  Intake 2186.56 ml  Output 2450 ml  Net -263.44 ml   Filed Weights   04/15/23 0708 04/16/23 0131 04/17/23 0500  Weight: 108.9 kg 112.2 kg 111.5 kg    Examination: General:  critically ill appearing on mech vent HEENT: MM pink/moist; ETT in place Neuro: opens eyes to stimulation; sometimes will move extremities per nurse; weak cough/gag; perrl CV: s1s2, irregularly irregular 90s, no m/r/g PULM:  dim BS bilaterally; on mech  vent PRVC GI: soft, bsx4 active  Extremities: warm/dry, no edema  Skin: no rashes or lesions   Labs/imaging personally reviewed:  CT head 3/19 > neg. Renal US 3/19 > neg. CT chest/abd/pelv 3/21 > infrarenal AAA Cerebral EEG 04/08/2023: No seizures. CT chest without con 3/23 collapse consolidation left lower lobe , scattered lytic lesions MRI 3/28 with Acute right PCA territory infarcts in the right occipital lobe with petechial hemorrhage and Additional acute infarct in the right parietal lobe and small acute infarct in the right frontal lobe  Assessment & Plan:   Acute metabolic encephalopathy  -Multifactorial 2/2 hyperammonemia, EtOH, new R sided strokes -CT head neg, TSH, ABG normal. -EEG 3/22 & 3/25 No seizures but shows encephalopathy. HX OF ETOH Acute stroke R sided on MRI: Presumed embolic with afib given multiple vascular territories but wonder if yeloma main issue as reports good adherence to xarelto at home P: -neuro following; appreciate recs -limit sedating meds -repeat imaging per neuro; consider resuming heparin in next few days pending repeat scans -statin, ASA -thiamine, folic acid, mvi -will hold Seroquel and trazodone given patient's poor mental status -cont lactulose; trend ammonia -neuroprotective measures- normothermia, euglycemia, HOB greater than 30, head in neutral alignment, normocapnia, normoxia.  -PT/OT/SLP when appropriate  Acute hypoxic respiratory failure  -Secondary to above + baseline COPD (though not on O2), OSA.  -Baseline 2  L nasal cannula at night -Intubated afternoon 3/27 after development of epistaxis for airway protection Hx COPD, OSA, tobacco dependence Possible aspiration P: -mental status precludes extubation -LTVV strategy with tidal volumes of 6-8 cc/kg ideal body weight -Goal plateau pressures less than 30 and driving pressures less than 15 -Wean PEEP/FiO2 for SpO2 >92% -VAP bundle in place -Daily SAT and SBT -PAD protocol in  place -wean sedation for RASS goal 0 to -1 -cont triple therapy nebulizers -rocephin broadened to zosyn given temp spike and increasing infiltrate on CXR on RLL likely aspiration; follow cultures; check mrsa pcr and consider adding vanc  Newly diagnosed myeloma Elevated Protein -Bone marrow biopsy positive for myeloma with IgG kappa myeloma seen P: -Oncology following, appreciate assistance -started on treatment this admission -Chemotherapy precautions -HZV ppx started PO 3/29  AKI on CKD3a Hypokalemia Hypercalcemia (resolved) -Renal function continues to wax and wane -D5W stopped and 3/28 P: -Trend BMP / urinary output -Replace electrolytes as indicated -Avoid nephrotoxic agents, ensure adequate renal perfusion  Hypoalbuminemia Protein calorie malnutrition P: -Optimize nutrition as able  Hx dCHF A.fib on Xarelto Essential HTN HLD -Echo 04/08/2023: Normal left ventricle ejection fraction but right ventricular dysfunction present and is new compared to July 2024 likely related to positive pressure ventilation and L sided disease and chronic hypoxemia -Bradycardia with Precedex earlier in course P: -Tele monitoring -cont to hold xarelto in setting of petechial hemorrhage; neuro recommending repeat scan and consider starting heparin  -statin and asa -metoprolol -cont amio -daily weights; strict I/O's  T2DM Plan: -increase ssi and basal -cbg monitoring   Best practice (evaluated daily):  Diet/type: tubefeeds  DVT prophylaxis: SCD  Pressure ulcer(s): pressure ulcer assessment deferred  GI prophylaxis: Pepcid Lines: N/A Foley:  Yes, and it is still needed Code Status:  full code Last date of multidisciplinary goals of care discussion: 3/31 updated wife at bedside. States she does not think patient would want long term ventilation/tracheostomy. She wants to talk with family to discuss code status and what to do if unable to get him off ventilator.   CRITICAL  CARE Performed by: Lidia Collum   Total critical care time: 35 minutes  JD Anselm Lis [facility] 04/17/2023, 8:38 AM  Please see [email or web address] for pager details.  From 7A-7P if no response, please call [phone]. After hours, please call ELink [phone].

## 2023-04-17 NOTE — Progress Notes (Signed)
 Pharmacy Antibiotic Note  Ronnie Dunn is a 63 y.o. male admitted on 04/03/2023.  Pharmacy has been consulted for Zosyn dosing for fever with suspected aspiration.    Plan: Zosyn 3.375g IV Q8H infused over 4hrs.  Pharmacy will sign off notes, but will peripherally follow renal function, culture results, and clinical course.   Height: 6' 2.02" (188 cm) Weight: 111.5 kg (245 lb 13 oz) IBW/kg (Calculated) : 82.24  Temp (24hrs), Avg:100.2 F (37.9 C), Min:99 F (37.2 C), Max:101.5 F (38.6 C)  Recent Labs  Lab 04/13/23 0323 04/13/23 1152 04/13/23 1849 04/14/23 0624 04/15/23 0500 04/16/23 0525 04/17/23 0318  WBC 7.3  --  6.9 7.6 7.4 5.2  --   CREATININE 1.60*   < > 1.84* 2.03* 2.11* 1.88* 1.66*  LATICACIDVEN  --   --  1.0  --   --   --   --    < > = values in this interval not displayed.    Estimated Creatinine Clearance: 60.5 mL/min (A) (by C-G formula based on SCr of 1.66 mg/dL (H)).    No Known Allergies  Antimicrobials this admission: 3/17 Cefepime >> 3/19 3/17 metronidazole >> 3/17 3/17 vancomycin > 3/18 3/19 Ceftriaxone >> 3/21, resumed 3/27 >> 3/31 3/21 Zosyn >> 3/22, resumed 3/31 >>   Dose adjustments this admission:   Microbiology results: 3/17 Resp panel: negative covid, flu, rsv 3/17 MRSA PCR: not detected 3/17 BCx: ngf 3/21 BCx: ngf 3/24 MRSA PCR: not detected 3/27 Tracheal aspirate: few C.tropicalis, few Staph lugdunensis 3/31 BCx: 3/31 UCx:    Thank you for allowing pharmacy to be a part of this patient's care.  Lynann Beaver PharmD, BCPS [facility] main pharmacy [phone] 04/17/2023 7:26 AM

## 2023-04-17 NOTE — Progress Notes (Signed)
 [facility] ADULT ICU REPLACEMENT PROTOCOL   The patient does apply for the [facility] Adult ICU Electrolyte Replacment Protocol based on the criteria listed below:   1.Exclusion criteria: TCTS, ECMO, Dialysis, and Myasthenia Gravis patients 2. Is GFR >/= 30 ml/min? Yes.    Patient's GFR today is 46 3. Is SCr </= 2? Yes.   Patient's SCr is 1.66 mg/dL 4. Did SCr increase >/= 0.5 in 24 hours? No. 5.Pt's weight >40kg  Yes.   6. Abnormal electrolyte(s): Phos 2.3, mag 1.8  7. Electrolytes replaced per protocol 8.  Call MD STAT for K+ </= 2.5, Phos </= 1, or Mag </= 1 Physician:  Dr Wallie Char, Lilia Argue 04/17/2023 5:07 AM

## 2023-04-18 DIAGNOSIS — R29725 NIHSS score 25: Secondary | ICD-10-CM | POA: Diagnosis not present

## 2023-04-18 DIAGNOSIS — J9601 Acute respiratory failure with hypoxia: Secondary | ICD-10-CM | POA: Diagnosis not present

## 2023-04-18 DIAGNOSIS — I63531 Cerebral infarction due to unspecified occlusion or stenosis of right posterior cerebral artery: Secondary | ICD-10-CM

## 2023-04-18 DIAGNOSIS — E876 Hypokalemia: Secondary | ICD-10-CM | POA: Diagnosis not present

## 2023-04-18 DIAGNOSIS — Z7901 Long term (current) use of anticoagulants: Secondary | ICD-10-CM

## 2023-04-18 DIAGNOSIS — G9341 Metabolic encephalopathy: Secondary | ICD-10-CM | POA: Diagnosis not present

## 2023-04-18 DIAGNOSIS — N1831 Chronic kidney disease, stage 3a: Secondary | ICD-10-CM | POA: Diagnosis not present

## 2023-04-18 LAB — PHOSPHORUS: Phosphorus: 2.6 mg/dL (ref 2.5–4.6)

## 2023-04-18 LAB — GLUCOSE, CAPILLARY
Glucose-Capillary: 304 mg/dL — ABNORMAL HIGH (ref 70–99)
Glucose-Capillary: 271 mg/dL — ABNORMAL HIGH (ref 70–99)
Glucose-Capillary: 251 mg/dL — ABNORMAL HIGH (ref 70–99)
Glucose-Capillary: 261 mg/dL — ABNORMAL HIGH (ref 70–99)
Glucose-Capillary: 240 mg/dL — ABNORMAL HIGH (ref 70–99)
Glucose-Capillary: 233 mg/dL — ABNORMAL HIGH (ref 70–99)

## 2023-04-18 LAB — URINE CULTURE: Culture: NO GROWTH

## 2023-04-18 LAB — BASIC METABOLIC PANEL WITH GFR
Anion gap: 6 (ref 5–15)
BUN: 57 mg/dL — ABNORMAL HIGH (ref 8–23)
CO2: 20 mmol/L — ABNORMAL LOW (ref 22–32)
Calcium: 6.8 mg/dL — ABNORMAL LOW (ref 8.9–10.3)
Chloride: 117 mmol/L — ABNORMAL HIGH (ref 98–111)
Creatinine, Ser: 1.77 mg/dL — ABNORMAL HIGH (ref 0.61–1.24)
GFR, Estimated: 43 mL/min — ABNORMAL LOW (ref >60)
Glucose, Bld: 341 mg/dL — ABNORMAL HIGH (ref 70–99)
Potassium: 4.2 mmol/L (ref 3.5–5.1)
Sodium: 143 mmol/L (ref 135–145)

## 2023-04-18 LAB — CBC
HCT: 29.1 % — ABNORMAL LOW (ref 39.0–52.0)
Hemoglobin: 8.5 g/dL — ABNORMAL LOW (ref 13.0–17.0)
MCH: 30.4 pg (ref 26.0–34.0)
MCHC: 29.2 g/dL — ABNORMAL LOW (ref 30.0–36.0)
MCV: 103.9 fL — ABNORMAL HIGH (ref 80.0–100.0)
Platelets: 258 10*3/uL (ref 150–400)
RBC: 2.8 MIL/uL — ABNORMAL LOW (ref 4.22–5.81)
RDW: 18.7 % — ABNORMAL HIGH (ref 11.5–15.5)
WBC: 5.2 10*3/uL (ref 4.0–10.5)
nRBC: 0.4 % — ABNORMAL HIGH (ref 0.0–0.2)

## 2023-04-18 LAB — HEPARIN LEVEL (UNFRACTIONATED): Heparin Unfractionated: <0.1 [IU]/mL — ABNORMAL LOW (ref 0.30–0.70)

## 2023-04-18 LAB — AMMONIA: Ammonia: 34 umol/L (ref 9–35)

## 2023-04-18 MED ORDER — QUETIAPINE FUMARATE 50 MG PO TABS
50.0000 mg | ORAL_TABLET | Freq: Two times a day (BID) | ORAL | Status: DC
  Administered 2023-04-18 (×2): 50 mg
  Filled 2023-04-18 (×2): qty 1

## 2023-04-18 MED ORDER — HEPARIN (PORCINE) 25000 UT/250ML-% IV SOLN
2500.0000 [IU]/h | INTRAVENOUS | Status: DC
  Administered 2023-04-18: 1250 [IU]/h via INTRAVENOUS
  Administered 2023-04-19: 1650 [IU]/h via INTRAVENOUS
  Filled 2023-04-18 (×4): qty 250

## 2023-04-18 MED ORDER — INSULIN ASPART 100 UNIT/ML IJ SOLN
0.0000 [IU] | INTRAMUSCULAR | Status: DC
  Administered 2023-04-18: 7 [IU] via SUBCUTANEOUS
  Administered 2023-04-18 (×2): 11 [IU] via SUBCUTANEOUS
  Administered 2023-04-19 (×2): 7 [IU] via SUBCUTANEOUS
  Administered 2023-04-19 (×4): 4 [IU] via SUBCUTANEOUS
  Administered 2023-04-20: 11 [IU] via SUBCUTANEOUS
  Administered 2023-04-20: 7 [IU] via SUBCUTANEOUS
  Administered 2023-04-20: 4 [IU] via SUBCUTANEOUS
  Administered 2023-04-20: 11 [IU] via SUBCUTANEOUS
  Administered 2023-04-20: 7 [IU] via SUBCUTANEOUS
  Administered 2023-04-20: 11 [IU] via SUBCUTANEOUS
  Administered 2023-04-21 (×2): 3 [IU] via SUBCUTANEOUS

## 2023-04-18 MED ORDER — VANCOMYCIN HCL 1250 MG/250ML IV SOLN
1250.0000 mg | INTRAVENOUS | Status: DC
  Administered 2023-04-18 – 2023-04-20 (×3): 1250 mg via INTRAVENOUS
  Filled 2023-04-18 (×3): qty 250

## 2023-04-18 MED ORDER — DEXMEDETOMIDINE HCL IN NACL 200 MCG/50ML IV SOLN
0.0000 ug/kg/h | INTRAVENOUS | Status: DC
  Administered 2023-04-18: 0.4 ug/kg/h via INTRAVENOUS
  Filled 2023-04-18: qty 50

## 2023-04-18 MED ORDER — NOREPINEPHRINE 4 MG/250ML-% IV SOLN
0.0000 ug/min | INTRAVENOUS | Status: DC
  Administered 2023-04-18: 2 ug/min via INTRAVENOUS
  Filled 2023-04-18: qty 250

## 2023-04-18 MED ORDER — FREE WATER
100.0000 mL | Status: DC
  Administered 2023-04-18 – 2023-04-20 (×13): 100 mL

## 2023-04-18 MED ORDER — INSULIN ASPART 100 UNIT/ML IJ SOLN
3.0000 [IU] | INTRAMUSCULAR | Status: DC
  Administered 2023-04-18 – 2023-04-20 (×11): 3 [IU] via SUBCUTANEOUS

## 2023-04-18 MED ORDER — LACTULOSE 10 GM/15ML PO SOLN
30.0000 g | Freq: Every day | ORAL | Status: DC
  Administered 2023-04-19: 30 g
  Filled 2023-04-18: qty 45

## 2023-04-18 MED ORDER — INSULIN GLARGINE-YFGN 100 UNIT/ML ~~LOC~~ SOLN
15.0000 [IU] | Freq: Every day | SUBCUTANEOUS | Status: DC
  Administered 2023-04-18: 15 [IU] via SUBCUTANEOUS
  Filled 2023-04-18: qty 0.15

## 2023-04-18 NOTE — Progress Notes (Addendum)
 NAME:  Ronnie Dunn, MRN:  [id number], DOB:  Jul 18, 1960, LOS: 15 ADMISSION DATE:  04/03/2023, CONSULTATION DATE:  04/07/23 REFERRING MD:  Jerolyn Center CHIEF COMPLAINT:  AMS   BRIEF  Ronnie Dunn is a 63 y.o. male who has a PMH as below including but not limited to COPD, OSA on CPAP, chronic diastolic CHF, HTN, HLD, PAF on Xarelto, tobacco dependence, alcohol use.  He is followed by [name] as an outpatient. He presented to [facility] ED on 3/17 with cough, fever/chills, nausea, weakness, fatigue, dyspnea x 2 weeks.  He had some confusion on and off and decreased appetite.  He had also complained of low back pain which was new and was not associated with any recent fall or trauma.  He had productive sputum, green in color.  He recently completed a course of azithromycin as an outpatient but had no relief with this.  Due to ongoing symptoms, he presented to the ED for further workup.  Initial chest x-ray showed bilateral interstitial opacities.  He was started on IV Lasix.  Renal function worsened, he had renal ultrasound on 3/19 that showed normal kidneys.  Follow-up chest x-ray showed cardiomegaly and ongoing interstitial opacities with small pleural effusions.  Diuresis was continued; however, renal function worsened further.  Was initially on vancomycin and cefepime however cefepime was stopped due to altered mental status.  This was switched to ceftriaxone on 3/19.  Overnight 3/20, he developed A-fib RVR.  He received Lopressor with improvement in heart rate.  Early a.m. 3/21 he had altered mental status and new fever to 102.9 rectally.  He was subsequently transferred to the ICU and PCCM and infectious disease were asked to see in consultation.  Upon arrival to the ICU, he is minimally responsive but is protecting his airway.  He has heart rates in the 110s to 120s, blood pressure stable.  Mucous membranes are very dry.  ABG was obtained and was normal (7.52/41/21). He has clear urine in his Foley.   Antibiotics were escalated from ceftriaxone to Zosyn.  Infectious disease has seen him and we have agreed to assess CT chest/abdomen/pelvis.  Pertinent  Medical History:  has Seizure (HCC); Hyponatremia; Alcohol use disorder, moderate, dependence (HCC); Class 1 obesity; Essential hypertension; Type 2 diabetes mellitus without complication, without long-term current use of insulin (HCC); Chronic obstructive pulmonary disease (HCC); Gastroesophageal reflux disease; Lower extremity edema; Permanent atrial fibrillation (HCC); OSA (obstructive sleep apnea); Chronic heart failure with preserved ejection fraction (HCC); Seizures (HCC); Protein-calorie malnutrition, moderate (HCC); Hypomagnesemia; Alcohol abuse; Obesity (BMI 30-39.9); Noncompliance with medication regimen; Aortic root dilation (HCC); Acquired thrombophilia (HCC); Pneumonia due to infectious organism; AKI (acute kidney injury) (HCC); Acute metabolic encephalopathy; Acute encephalopathy; Hypokalemia; Acute respiratory failure with hypoxia (HCC); 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); and Sepsis (HCC) on their problem list.  Significant Hospital Events: Including procedures, antibiotic start and stop dates in addition to other pertinent events   3/17 admit 3.19 - BNPP 800 3/20 - HFNC O2 need 3/21 - Altered but protecting airway. Vitals stable.Marland Kitchen 7L [place] O2 need. BN 275 (down from 813) ID Consult: Fever, sepsis of unknown source--potentially due to aspiration in context of encephalopathy. Alcoholic liver disease. 3/AKI. 4Encephalopathy --presumably from alcohol withdrawal, hepatic encaphalopathy though no cirrhosis on CT at [facility] in 2019 3/22 - HEme conslt - cocern for myleoma v Waldenstrom ( .  His total protein is quite high.  His calcium is quite high.  His albumin is low.) Getting seruum viscolity due to Acue  encephalpthy. Pan CT   - unremarlkable. ID thinks low odds for infection. ID recommending MRI. RN/Wife report Obutndatioin with  restlessness - on prn ativan. Last drink > 2 weeks go . Wife describes 2022 admit for Low NA - and seziure like activity but no EEG or neuro conslt in record.  Started Precedex.  Antibiotics all stopped by ID 3/23 cerebral EEG without any seizures but showing encephalopathy.  MRI brain could not be done because patient desaturated while supine. Per RN "His blood is too thick to obtain a serum protein, parathyroid, viscosity. " Ammonia 65. 3/25 bone marrow biopsy  3/27 bone marrow biopsy positive for myeloma, afternoon development of epi taxis after core track placement attempt this coupled with lethargy prompted intubation for airway protection 3/28 no issues overnight, remains sedated on ventilator, pending MRI brain  Interim History / Subjective:  Yesterday wife decided to make patient DNR; states he would likely not want all this and would not want to be on vent long term.  Resp cultures growing staph lugdunensis Remains encephalopathic moving all extremities not to command; perrl Intubated on vent  Objective:  Blood pressure (!) 171/54, pulse 68, temperature (!) 100.4 F (38 C), resp. rate 18, height 6\' 2"  (1.88 m), weight 111.3 kg, SpO2 93%.    Vent Mode: PRVC FiO2 (%):  [30 %-40 %] 40 % Set Rate:  [18 bmp] 18 bmp Vt Set:  [650 mL] 650 mL PEEP:  [5 cmH20] 5 cmH20 Plateau Pressure:  [16 cmH20-19 cmH20] 17 cmH20   Intake/Output Summary (Last 24 hours) at 04/18/2023 0850 Last data filed at 04/18/2023 1914 Gross per 24 hour  Intake 12290.96 ml  Output 1950 ml  Net 10340.96 ml   Filed Weights   04/16/23 0131 04/17/23 0500 04/18/23 0453  Weight: 112.2 kg 111.5 kg 111.3 kg    Examination: General:  critically ill appearing on mech vent HEENT: MM pink/moist; ETT in place Neuro: some agitation this am moving all extremities but not to command; eyes open; perrl CV: s1s2, irregularly irregular; no m/r/g PULM:  dim BS bilaterally; on mech vent PRVC GI: soft, bsx4 active  Extremities:  warm/dry, no edema  Skin: no rashes or lesions   Labs/imaging personally reviewed:  CT head 3/19 > neg. Renal US 3/19 > neg. CT chest/abd/pelv 3/21 > infrarenal AAA Cerebral EEG 04/08/2023: No seizures. CT chest without con 3/23 collapse consolidation left lower lobe , scattered lytic lesions MRI 3/28 with Acute right PCA territory infarcts in the right occipital lobe with petechial hemorrhage and Additional acute infarct in the right parietal lobe and small acute infarct in the right frontal lobe  Assessment & Plan:   Acute metabolic encephalopathy  -Multifactorial 2/2 hyperammonemia, EtOH, new R sided strokes -CT head neg, TSH, ABG normal. -EEG 3/22 & 3/25 No seizures but shows encephalopathy. HX OF ETOH Acute stroke R sided on MRI: Presumed embolic with afib given multiple vascular territories but wonder if yeloma main issue as reports good adherence to xarelto at home P: -neuro following; appreciate recs -limit sedating meds -repeat imaging per neuro; when to resume AC for afib?; consider repeat imaging? -statin, ASA -thiamine, folic acid, mvi -will add seroquel back today given increase agitation -ammonia wnl this am; decrease dose of lactulose; trend ammonia -neuroprotective measures- normothermia, euglycemia, HOB greater than 30, head in neutral alignment, normocapnia, normoxia.  -PT/OT/SLP when appropriate  Acute hypoxic respiratory failure  -Secondary to above + baseline COPD (though not on O2), OSA.  -Baseline 2 L  nasal cannula at night -Intubated afternoon 3/27 after development of epistaxis for airway protection Hx COPD, OSA, tobacco dependence Staph lugdunensis pna P: -mental status precludes extubation -LTVV strategy with tidal volumes of 6-8 cc/kg ideal body weight -Wean PEEP/FiO2 for SpO2 >92% -VAP bundle in place -Daily SAT and SBT -PAD protocol in place -wean sedation for RASS goal 0 to -1 -consider adding precedex to help wean off sedation -cont triple  therapy nebulizers -stop zosyn and start vanc for staph lugdunensis  Newly diagnosed myeloma Elevated Protein -Bone marrow biopsy positive for myeloma with IgG kappa myeloma seen P: -Oncology following, appreciate assistance -started on treatment this admission -Chemotherapy precautions -HZV ppx started PO 3/29  AKI on CKD3a Hypokalemia Hypercalcemia (resolved) -Renal function continues to wax and wane -D5W stopped and 3/28 P: -Trend BMP / urinary output -Replace electrolytes as indicated -Avoid nephrotoxic agents, ensure adequate renal perfusion  Hypoalbuminemia Protein calorie malnutrition P: -Optimize nutrition as able  Hx dCHF A.fib on Xarelto Essential HTN HLD -Echo 04/08/2023: Normal left ventricle ejection fraction but right ventricular dysfunction present and is new compared to July 2024 likely related to positive pressure ventilation and L sided disease and chronic hypoxemia P: -Tele monitoring -cont to hold xarelto in setting of petechial hemorrhage; when to resume AC per neuro -statin and asa -cont amio -daily weights; strict I/O's  T2DM Plan: -increase ssi and basal -add TF  coverage -cbg monitoring   Best practice (evaluated daily):  Diet/type: tubefeeds  DVT prophylaxis: SCD  Pressure ulcer(s): pressure ulcer assessment deferred  GI prophylaxis: Pepcid Lines: N/A Foley:  Yes, and it is still needed Code Status:  DNR Last date of multidisciplinary goals of care discussion: 3/31 updated wife at bedside. States she does not think patient would want long term ventilation/tracheostomy. She spoke with family and decided to make patient DNR. 4/1 wife updated; she is okay with giving time limited trial on vent. If no better by this weekend/early next week would not want to pursue trach and agrees with one-way extubation.   CRITICAL CARE Performed by: Lidia Collum   Total critical care time: 35 minutes  [facility] 04/18/2023, 8:50 AM  Please see [email or web address] for pager details.  From 7A-7P if no response, please call [phone]. After hours, please call ELink [phone].

## 2023-04-18 NOTE — Progress Notes (Signed)
 PHARMACY - ANTICOAGULATION CONSULT NOTE  Pharmacy Consult for Heparin Indication: atrial fibrillation  No Known Allergies  Patient Measurements: Height: 6\' 2"  (188 cm) Weight: 111.3 kg (245 lb 6 oz) IBW/kg (Calculated) : 82.2 HEPARIN DW (KG): 103.8  Vital Signs: Temp: 100 F (37.8 C) (04/01 1315) Temp Source: Bladder (04/01 1200) BP: 140/70 (04/01 1315) Pulse Rate: 112 (04/01 1315)  Labs: Recent Labs    04/16/23 0525 04/17/23 0318 04/17/23 1622 04/18/23 0357  HGB 8.7*  --  8.5* 8.5*  HCT 29.9*  --  28.9* 29.1*  PLT 232  --  254 258  CREATININE 1.88* 1.66*  --  1.77*    Estimated Creatinine Clearance: 56.7 mL/min (A) (by C-G formula based on SCr of 1.77 mg/dL (H)).   Medical History: Past Medical History:  Diagnosis Date   Chronic heart failure with preserved ejection fraction (HCC) 06/01/2021   Chronic obstructive pulmonary disease (HCC) 05/28/2021   Class 1 obesity 03/29/2020   Essential hypertension 03/29/2020   Gastroesophageal reflux disease 05/28/2021   Hyponatremia 03/29/2020   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) 04/13/2023   OSA (obstructive sleep apnea) 06/01/2021   Permanent atrial fibrillation (HCC) 05/28/2021   Thoracic aortic aneurysm without rupture (HCC) 06/01/2021   Type 2 diabetes mellitus without complication, without long-term current use of insulin (HCC) 05/28/2021    Medications:  Scheduled:   acyclovir  400 mg Per Tube BID   arformoterol  15 mcg Nebulization BID   aspirin  325 mg Per Tube Daily   atorvastatin  40 mg Per Tube Daily   budesonide (PULMICORT) nebulizer solution  0.5 mg Nebulization BID   Chlorhexidine Gluconate Cloth  6 each Topical Daily   feeding supplement (PROSource TF20)  60 mL Per Tube TID   folic acid  1 mg Per Tube Daily   free water  100 mL Per Tube Q4H   insulin aspart  0-20 Units Subcutaneous Q4H   insulin aspart  3 Units Subcutaneous Q4H   insulin glargine-yfgn  15 Units Subcutaneous QHS   [START ON  04/19/2023] lactulose  30 g Per Tube Daily   multivitamin  15 mL Per Tube Daily   nicotine  21 mg Transdermal Daily   mouth rinse  15 mL Mouth Rinse Q2H   pantoprazole (PROTONIX) IV  40 mg Intravenous Q24H   QUEtiapine  50 mg Per Tube BID   revefenacin  175 mcg Nebulization Daily   sodium chloride flush  10-40 mL Intracatheter Q12H   sodium chloride flush  3-10 mL Intravenous Q12H   thiamine  100 mg Per Tube Daily   Or   thiamine  100 mg Intravenous Daily   Infusions:   amiodarone 30 mg/hr (04/18/23 1056)   dexmedetomidine (PRECEDEX) IV infusion 0.4 mcg/kg/hr (04/18/23 1042)   feeding supplement (OSMOLITE 1.5 CAL) 1,000 mL (04/18/23 1042)   fentaNYL infusion INTRAVENOUS 150 mcg/hr (04/18/23 1053)   norepinephrine (LEVOPHED) Adult infusion 2 mcg/min (04/18/23 1201)   vancomycin      Assessment: 62 yoM admitted on 3/17 with PMH of Afib on Xarelto anticoagulation.  Xarelto was initially resumed inpatient, but held on 3/28 due to acute ischemic stroke.  Pharmacy is consulted on 4/1 to start Heparin for Afib, no bolus, with CVA dosing.    SCr 1.77 CBC:  Hgb remains low/stable at 8.5, Plt WNL  Goal of Therapy:  Heparin level 0.3-0.5 units/ml  (low goal d/t recent CVA)  Monitor platelets by anticoagulation protocol: Yes   Plan:  No heparin bolus  Start heparin IV infusion at 1250 units/hr Heparin level in 6 hours after starting Daily heparin level and CBC   Lynann Beaver PharmD, BCPS [facility] main pharmacy [phone] 04/18/2023 1:55 PM

## 2023-04-18 NOTE — Progress Notes (Signed)
 Nutrition Follow-up  DOCUMENTATION CODES:   Not applicable  INTERVENTION:  - Continue goal TF regimen: Osmolite 1.5 at 55 ml/h (1320 ml per day) Prosource TF20 60 ml TID Provides 2220 kcal, 143 gm protein, 1006 ml free water daily   - FWF per CCM/MD. Currently ordered Q4H which in addition to TF would provide 1658mL/day.  NUTRITION DIAGNOSIS:   Inadequate oral intake related to inability to eat as evidenced by NPO status. *ongoing  GOAL:   Patient will meet greater than or equal to 90% of their needs *met with TF  MONITOR:   Diet advancement, Labs, Weight trends  REASON FOR ASSESSMENT:   Other (Comment) (Cortrak)    ASSESSMENT:   63 y.o. male who has a PMH significant for COPD, OSA on CPAP, CHF, HTN, HLD, alcohol use who presented with cough, fever/chills, nausea, weakness, fatigue, dyspnea x 2 weeks. Admitted for sepsis and acute metabolic encephalopathy.  3/17 Admit 3/21 Rapid called, patient with AMS, transferred to ICU; Cortrak placed 3/23 Osmolite 1.5 started @ 20 in PM 3/25 Bone marrow biopsy; reached goal TF 3/26 Patient pulled out Cortrak; TF stopped 3/27 Intubated; OGT placed 3/28 Goal TF restarted  Patient remains intubated on ventilator support MV: 11.6 L/min Temp (24hrs), Avg:100.9 F (38.3 C), Min:100 F (37.8 C), Max:101.7 F (38.7 C)  Patient remains on goal TF, tolerating well with no noted issues. Still having type 7 BM's and rectal tube in place but patient continues to receive lactulose. Will continue goal TF regimen.  Per CCM, encephalopathy ongoing and mental status precludes extubation at this time.  Of note, patient made DNR yesterday per wife as she stated he would likely not want to be on vent long term.    Admit weight: 245# Current weight: 245# I&O's: +17.4L since admit  + for moderate pitting edema to right and left upper extremities   Medications reviewed and include: Lactulose once daily, 1mg  folic acid, MVI, 100mg   thiamine, Q4H FWF Precedex Fentanyl   Labs reviewed:  Creatinine 1.77 HA1C 7.2 (June 2023) Blood Glucose 228-304 x24 hours   Diet Order:   Diet Order             Diet NPO time specified  Diet effective now                   EDUCATION NEEDS:  Education needs have been addressed  Skin:  Skin Assessment: Skin Integrity Issues: Skin Integrity Issues:: DTI DTI: Mid Right Back  Last BM:  3/31 - rectal tube  Height:  Ht Readings from Last 1 Encounters:  04/17/23 6\' 2"  (1.88 m)   Weight:  Wt Readings from Last 1 Encounters:  04/18/23 111.3 kg   Ideal Body Weight:  86.36 kg  BMI:  Body mass index is 31.5 kg/m.  Estimated Nutritional Needs:  Kcal:  2200-2500 kcals Protein:  130-145 grams Fluid:  >/= 2.2L    Shelle Iron RD, LDN Contact via Secure Chat.

## 2023-04-18 NOTE — Progress Notes (Signed)
 PHARMACY - ANTICOAGULATION CONSULT NOTE  Pharmacy Consult for Heparin Indication: atrial fibrillation  No Known Allergies  Patient Measurements: Height: 6\' 2"  (188 cm) Weight: 111.3 kg (245 lb 6 oz) IBW/kg (Calculated) : 82.2 HEPARIN DW (KG): 103.8  Vital Signs: Temp: 100.8 F (38.2 C) (04/01 2000) Temp Source: Bladder (04/01 1600) BP: 140/93 (04/01 2000) Pulse Rate: 95 (04/01 2000)  Labs: Recent Labs    04/16/23 0525 04/17/23 0318 04/17/23 1622 04/18/23 0357 04/18/23 2043  HGB 8.7*  --  8.5* 8.5*  --   HCT 29.9*  --  28.9* 29.1*  --   PLT 232  --  254 258  --   HEPARINUNFRC  --   --   --   --  <0.10*  CREATININE 1.88* 1.66*  --  1.77*  --     Estimated Creatinine Clearance: 56.7 mL/min (A) (by C-G formula based on SCr of 1.77 mg/dL (H)).   Medical History: Past Medical History:  Diagnosis Date   Chronic heart failure with preserved ejection fraction (HCC) 06/01/2021   Chronic obstructive pulmonary disease (HCC) 05/28/2021   Class 1 obesity 03/29/2020   Essential hypertension 03/29/2020   Gastroesophageal reflux disease 05/28/2021   Hyponatremia 03/29/2020   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) 04/13/2023   OSA (obstructive sleep apnea) 06/01/2021   Permanent atrial fibrillation (HCC) 05/28/2021   Thoracic aortic aneurysm without rupture (HCC) 06/01/2021   Type 2 diabetes mellitus without complication, without long-term current use of insulin (HCC) 05/28/2021    Medications:  Scheduled:   acyclovir  400 mg Per Tube BID   arformoterol  15 mcg Nebulization BID   aspirin  325 mg Per Tube Daily   atorvastatin  40 mg Per Tube Daily   budesonide (PULMICORT) nebulizer solution  0.5 mg Nebulization BID   Chlorhexidine Gluconate Cloth  6 each Topical Daily   feeding supplement (PROSource TF20)  60 mL Per Tube TID   folic acid  1 mg Per Tube Daily   free water  100 mL Per Tube Q4H   insulin aspart  0-20 Units Subcutaneous Q4H   insulin aspart  3 Units  Subcutaneous Q4H   insulin glargine-yfgn  15 Units Subcutaneous QHS   [START ON 04/19/2023] lactulose  30 g Per Tube Daily   multivitamin  15 mL Per Tube Daily   nicotine  21 mg Transdermal Daily   mouth rinse  15 mL Mouth Rinse Q2H   pantoprazole (PROTONIX) IV  40 mg Intravenous Q24H   QUEtiapine  50 mg Per Tube BID   revefenacin  175 mcg Nebulization Daily   sodium chloride flush  10-40 mL Intracatheter Q12H   sodium chloride flush  3-10 mL Intravenous Q12H   thiamine  100 mg Per Tube Daily   Or   thiamine  100 mg Intravenous Daily   Infusions:   amiodarone 30 mg/hr (04/18/23 1956)   dexmedetomidine (PRECEDEX) IV infusion Stopped (04/18/23 1140)   feeding supplement (OSMOLITE 1.5 CAL) 55 mL/hr at 04/18/23 1956   fentaNYL infusion INTRAVENOUS 150 mcg/hr (04/18/23 1956)   heparin 1,250 Units/hr (04/18/23 1956)   norepinephrine (LEVOPHED) Adult infusion Stopped (04/18/23 1349)   vancomycin 1,250 mg (04/18/23 2050)    Assessment: 62 yoM admitted on 3/17 with PMH of Afib on Xarelto anticoagulation.  Xarelto was initially resumed inpatient, but held on 3/28 due to acute ischemic stroke.  Pharmacy is consulted on 4/1 to start Heparin for Afib, no bolus, with CVA dosing.    04/18/2023: Initial heparin  level <0.1 on IV heparin 1250 units/hr SCr 1.77 CBC:  Hgb remains low/stable at 8.5, Plt WNL No bleeding or infusion related issues reported by RN  Goal of Therapy:  Heparin level 0.3-0.5 units/ml  (low goal d/t recent CVA)  Monitor platelets by anticoagulation protocol: Yes   Plan:  No heparin bolus  Increase heparin IV infusion to 1500 units/hr Heparin level 6 hours after rate increase Daily heparin level and CBC  Junita Push, PharmD, BCPS 04/18/2023 9:38 PM  Addendum: -Repeat heparin level 0.12- still sub-therapeutic despite rate increase to 1500 units/hr -No infusion related concerns reported by RN -Hg 7.9- low/decreased, pltc WNL.  RN reported scant blood when  suctioning.  Plan:  Increase heparin 1650 units/hr and recheck heparin level 8h after rate increase.  Junita Push, PharmD, BCPS 04/19/2023@6 :00 AM

## 2023-04-18 NOTE — Progress Notes (Signed)
 OT Cancellation Note  Patient Details Name: ROGUE RAFALSKI MRN: [id number] DOB: [birth year]   Cancelled Treatment:    Reason Eval/Treat Not Completed: Other (comment) Patient remains on vent with sedation at this time. OT to sign off with patient being unable to participate for over week at this time. Please re order therapy when patient is able to participate.  Rosalio Loud, MS Acute Rehabilitation Department Office# [phone]  04/18/2023, 8:12 AM

## 2023-04-18 NOTE — Progress Notes (Signed)
 Pharmacy Antibiotic Note  Ronnie Dunn is a 63 y.o. male admitted on 04/03/2023.  Pharmacy has been consulted for Zosyn and Vancomycin dosing for fever, suspected aspiration pneumonia.    Plan: Zosyn 3.375g IV Q8H infused over 4hrs.  Vancomycin 1250 mg IV q24h.  (SCr 1.77, Vd 0.5, est AUC 492) Measure Vanc levels as needed.  Goal AUC = 400 - 550 Follow up renal function, culture results, and clinical course    Height: 6\' 2"  (188 cm) Weight: 111.3 kg (245 lb 6 oz) IBW/kg (Calculated) : 82.2  Temp (24hrs), Avg:100.9 F (38.3 C), Min:100 F (37.8 C), Max:101.7 F (38.7 C)  Recent Labs  Lab 04/13/23 1849 04/14/23 0624 04/15/23 0500 04/16/23 0525 04/17/23 0318 04/17/23 1622 04/18/23 0357  WBC 6.9 7.6 7.4 5.2  --  5.9 5.2  CREATININE 1.84* 2.03* 2.11* 1.88* 1.66*  --  1.77*  LATICACIDVEN 1.0  --   --   --   --   --   --     Estimated Creatinine Clearance: 56.7 mL/min (A) (by C-G formula based on SCr of 1.77 mg/dL (H)).    No Known Allergies  Antimicrobials this admission: 3/17 Cefepime >> 3/19 3/17 metronidazole >> 3/17 3/17 vancomycin > 3/18, resumed 3/31 >>  3/19 Ceftriaxone >> 3/21, resumed 3/27 >> 3/31 3/21 Zosyn >> 3/22, resumed 3/31 >>   Dose adjustments this admission:   Microbiology results: 3/17 Resp panel: negative covid, flu, rsv 3/17 MRSA PCR: not detected 3/17 BCx: ngf 3/21 BCx: ngf 3/24 MRSA PCR: not detected 3/27 Tracheal aspirate: few C.tropicalis, few Staph lugdunensis (sens cipro/gent/septra/vanc) 3/31 BCx: ngtd 3/31 UCx: ip 3/31 TA: GPC, GNR, GPR, yeast   Thank you for allowing pharmacy to be a part of this patient's care.  Lynann Beaver PharmD, BCPS [facility] main pharmacy [phone] 04/18/2023 8:47 AM

## 2023-04-18 NOTE — Consult Note (Signed)
 NEUROLOGY CONSULT NOTE   Date of service: April 18, 2023 Patient Name: Ronnie Dunn MRN:  [id number] DOB:  April 06, 1960 Chief Complaint: "AMS" Requesting Provider: Tomma Lightning, MD  History of Present Illness  Ronnie Dunn is a 63 y.o. male with hx of CHF, HTN, GERD, multiple myeloma, permanent A fib on xarelto, OSA, COPD, tobacco and alcohol use who presents  for evaluation of cough, fever and chills, nausea, weakness, fatigue and dyspnea for 2 weeks with some confusion. He underwent a bone marrow biopsy and is positive for myeloma. Due to continued AMS, he underwent MRI brain which demonstrated a R PCA stroke. 3/23 EEG  with no seizures. He developed epitaxis  he was intubated due to inability to protect his airway. MRI brain was obtained and revealed  Acute right PCA territory infarcts in the right occipital lobe with petechial hemorrhage, and right parietal lobe and right frontal lobe infarct.   LKW: unclear  Modified rankin score: 1-No significant post stroke disability and can perform usual duties with stroke symptoms IV Thrombolysis:  No  EVT: No LVO    NIHSS components Score: Comment  1a Level of Conscious 0[]  1[]  2[]  3[x]      1b LOC Questions 0[]  1[]  2[x]       1c LOC Commands 0[]  1[]  2[x]       2 Best Gaze 0[x]  1[]  2[]       3 Visual 0[]  1[]  2[]  3[x]      4 Facial Palsy 0[x]  1[]  2[]  3[]      5a Motor Arm - left 0[]  1[]  2[x]  3[]  4[]  UN[]    5b Motor Arm - Right 0[]  1[]  2[x]  3[]  4[]  UN[]    6a Motor Leg - Left 0[]  1[]  2[]  3[x]  4[]  UN[]    6b Motor Leg - Right 0[]  1[]  2[]  3[x]  4[]  UN[]    7 Limb Ataxia 0[x]  1[]  2[]  3[]  UN[]     8 Sensory 0[x]  1[]  2[]  UN[]      9 Best Language 0[]  1[]  2[]  3[x]      10 Dysarthria 0[]  1[]  2[x]  UN[]      11 Extinct. and Inattention 0[x]  1[]  2[]       TOTAL: 25      ROS  Unable to ascertain due to intubation.  Past History   Past Medical History:  Diagnosis Date   Chronic heart failure with preserved ejection fraction (HCC) 06/01/2021   Chronic  obstructive pulmonary disease (HCC) 05/28/2021   Class 1 obesity 03/29/2020   Essential hypertension 03/29/2020   Gastroesophageal reflux disease 05/28/2021   Hyponatremia 03/29/2020   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) 04/13/2023   OSA (obstructive sleep apnea) 06/01/2021   Permanent atrial fibrillation (HCC) 05/28/2021   Thoracic aortic aneurysm without rupture (HCC) 06/01/2021   Type 2 diabetes mellitus without complication, without long-term current use of insulin (HCC) 05/28/2021    Past Surgical History:  Procedure Laterality Date   BIOPSY  04/05/2022   Procedure: BIOPSY;  Surgeon: Kathi Der, MD;  Location: [facility];  Service: Gastroenterology;;   COLONOSCOPY WITH PROPOFOL N/A 04/05/2022   Procedure: COLONOSCOPY WITH PROPOFOL;  Surgeon: Kathi Der, MD;  Location: [facility];  Service: Gastroenterology;  Laterality: N/A;   ESOPHAGOGASTRODUODENOSCOPY (EGD) WITH PROPOFOL N/A 04/05/2022   Procedure: ESOPHAGOGASTRODUODENOSCOPY (EGD) WITH PROPOFOL;  Surgeon: Kathi Der, MD;  Location: [facility];  Service: Gastroenterology;  Laterality: N/A;   POLYPECTOMY  04/05/2022   Procedure: POLYPECTOMY;  Surgeon: Kathi Der, MD;  Location: [facility];  Service: Gastroenterology;;    Family History: Family History  Problem Relation Age of Onset   Hypertension Other     Social History  reports that he has been smoking cigarettes. He started smoking about 47 years ago. He has a 23.6 pack-year smoking history. He has never used smokeless tobacco. He reports current alcohol use of about 20.0 standard drinks of alcohol per week. He reports that he does not currently use drugs.  No Known Allergies  Medications   Current Facility-Administered Medications:    acetaminophen (TYLENOL) 160 MG/5ML solution 650 mg, 650 mg, Per Tube, Q6H PRN, Alwyn Ren, MD, 650 mg at 04/18/23 0346   acetaminophen (TYLENOL) tablet 650 mg, 650 mg, Per Tube, Q6H PRN, 650  mg at 04/17/23 1210 **OR** acetaminophen (TYLENOL) suppository 650 mg, 650 mg, Rectal, Q6H PRN, Bell, Michelle T, RPH   acyclovir (ZOVIRAX) 200 MG capsule 400 mg, 400 mg, Per Tube, BID, Ellington, Abby K, RPH, 400 mg at 04/18/23 0842   albuterol (PROVENTIL) (2.5 MG/3ML) 0.083% nebulizer solution 2.5 mg, 2.5 mg, Nebulization, Q2H PRN, Kirby Crigler, Mir M, MD, 2.5 mg at 04/16/23 1554   [EXPIRED] amiodarone (NEXTERONE PREMIX) 360-4.14 MG/200ML-% (1.8 mg/mL) IV infusion, 60 mg/hr, Intravenous, Continuous, Held at 04/15/23 0131 **FOLLOWED BY** amiodarone (NEXTERONE PREMIX) 360-4.14 MG/200ML-% (1.8 mg/mL) IV infusion, 30 mg/hr, Intravenous, Continuous, Ogan, Okoronkwo U, MD, Last Rate: 16.67 mL/hr at 04/18/23 1030, 30 mg/hr at 04/18/23 1030   arformoterol (BROVANA) nebulizer solution 15 mcg, 15 mcg, Nebulization, BID, Desai, Rahul P, PA-C, 15 mcg at 04/18/23 0920   artificial tears (LACRILUBE) ophthalmic ointment, , Both Eyes, Q8H PRN, Ogan, Okoronkwo U, MD, 1 Application at 04/18/23 0319   aspirin tablet 325 mg, 325 mg, Per Tube, Daily, Hunsucker, Lesia Sago, MD, 325 mg at 04/18/23 0843   atorvastatin (LIPITOR) tablet 40 mg, 40 mg, Per Tube, Daily, Hunsucker, Lesia Sago, MD, 40 mg at 04/18/23 0843   budesonide (PULMICORT) nebulizer solution 0.5 mg, 0.5 mg, Nebulization, BID, Desai, Rahul P, PA-C, 0.5 mg at 04/18/23 0920   Chlorhexidine Gluconate Cloth 2 % PADS 6 each, 6 each, Topical, Daily, Kirby Crigler, Mir M, MD, 6 each at 04/18/23 0330   dexmedetomidine (PRECEDEX) 200 MCG/50ML (4 mcg/mL) infusion, 0-1.2 mcg/kg/hr, Intravenous, Titrated, Suzie Portela, John D, PA-C   feeding supplement (OSMOLITE 1.5 CAL) liquid 1,000 mL, 1,000 mL, Per Tube, Continuous, Harris, Whitney D, NP, Last Rate: 55 mL/hr at 04/18/23 1030, Infusion Verify at 04/18/23 1030   feeding supplement (PROSource TF20) liquid 60 mL, 60 mL, Per Tube, TID, Harris, Whitney D, NP, 60 mL at 04/18/23 0842   fentaNYL (SUBLIMAZE) bolus via infusion 50-100 mcg,  50-100 mcg, Intravenous, Q30 min PRN, Hunsucker, Lesia Sago, MD, 100 mcg at 04/18/23 0841   fentaNYL in NS (20mcg/ml) infusion-PREMIX, 25-200 mcg/hr, Intravenous, Continuous, Payne, John D, PA-C, Last Rate: 20 mL/hr at 04/18/23 1030, 200 mcg/hr at 04/18/23 1030   folic acid (FOLVITE) tablet 1 mg, 1 mg, Per Tube, Daily, Bell, Michelle T, RPH, 1 mg at 04/18/23 0843   free water 200 mL, 200 mL, Per Tube, Q2H, Tobey Grim, MD, 200 mL at 04/18/23 1030   hydrALAZINE (APRESOLINE) injection 10 mg, 10 mg, Intravenous, Q6H PRN, Kirby Crigler, Mir M, MD, 10 mg at 04/12/23 1818   insulin aspart (novoLOG) injection 0-20 Units, 0-20 Units, Subcutaneous, Q4H, Payne, John D, PA-C   insulin aspart (novoLOG) injection 3 Units, 3 Units, Subcutaneous, Q4H, Payne, John D, PA-C   insulin glargine-yfgn (SEMGLEE) injection 15 Units, 15 Units, Subcutaneous, QHS, Lidia Collum, PA-C   [  START ON 04/19/2023] lactulose (CHRONULAC) 10 GM/15ML solution 30 g, 30 g, Per Tube, Daily, Suzie Portela, John D, PA-C   metoprolol tartrate (LOPRESSOR) injection 2.5 mg, 2.5 mg, Intravenous, Q4H PRN, Tobey Grim, MD, 2.5 mg at 04/12/23 1618   midazolam (VERSED) injection 2 mg, 2 mg, Intravenous, Q1H PRN, Hunsucker, Lesia Sago, MD, 2 mg at 04/18/23 4967   multivitamin liquid 15 mL, 15 mL, Per Tube, Daily, BellVeatrice Bourbon, RPH, 15 mL at 04/18/23 0846   nicotine (NICODERM CQ - dosed in mg/24 hours) patch 21 mg, 21 mg, Transdermal, Daily, [place], Eric J, DO, 21 mg at 04/18/23 0902   ondansetron (ZOFRAN) tablet 4 mg, 4 mg, Per Tube, Q6H PRN **OR** ondansetron (ZOFRAN) injection 4 mg, 4 mg, Intravenous, Q6H PRN, Josph Macho, MD, 4 mg at 04/17/23 0541   Oral care mouth rinse, 15 mL, Mouth Rinse, Q2H, Hunsucker, Lesia Sago, MD, 15 mL at 04/18/23 1000   Oral care mouth rinse, 15 mL, Mouth Rinse, PRN, Hunsucker, Lesia Sago, MD   pantoprazole (PROTONIX) injection 40 mg, 40 mg, Intravenous, Q24H, Alwyn Ren, MD, 40 mg at  04/17/23 1203   QUEtiapine (SEROQUEL) tablet 50 mg, 50 mg, Per Tube, BID, Suzie Portela, John D, PA-C   revefenacin (YUPELRI) nebulizer solution 175 mcg, 175 mcg, Nebulization, Daily, Celine Mans, Rahul P, PA-C, 175 mcg at 04/18/23 0920   sodium chloride (OCEAN) 0.65 % nasal spray 1 spray, 1 spray, Each Nare, PRN, [place], Alvira Philips, DO, 1 spray at 04/04/23 2109   sodium chloride flush (NS) 0.9 % injection 10-40 mL, 10-40 mL, Intracatheter, Q12H, Tobey Grim, MD, 30 mL at 04/17/23 2228   sodium chloride flush (NS) 0.9 % injection 10-40 mL, 10-40 mL, Intracatheter, PRN, Tobey Grim, MD   sodium chloride flush (NS) 0.9 % injection 3-10 mL, 3-10 mL, Intravenous, Q12H, Hunsucker, Lesia Sago, MD, 10 mL at 04/18/23 0857   sodium chloride flush (NS) 0.9 % injection 3-10 mL, 3-10 mL, Intravenous, PRN, Hunsucker, Lesia Sago, MD   thiamine (VITAMIN B1) tablet 100 mg, 100 mg, Per Tube, Daily, 100 mg at 04/18/23 0843 **OR** thiamine (VITAMIN B1) injection 100 mg, 100 mg, Intravenous, Daily, Ellington, Abby K, RPH   vancomycin (VANCOREADY) IVPB 1250 mg/250 mL, 1,250 mg, Intravenous, Q24H, Winfield Rast, RPH  Vitals   Vitals:   04/18/23 0924 04/18/23 0928 04/18/23 0929 04/18/23 1000  BP:    (!) 96/51  Pulse:    75  Resp:    18  Temp:    100.2 F (37.9 C)  TempSrc:      SpO2: 94% 93% 93% 92%  Weight:      Height:        Body mass index is 31.5 kg/m.  Physical Exam   General: appears somewhat uncomfortable and in restraints, in no acute distress.  HENT: Normal oropharynx and mucosa. Normal external appearance of ears and nose.  Neck: Supple, no pain or tenderness  CV: No JVD. No peripheral edema.  Pulmonary: Symmetric Chest rise. Normal respiratory effort.  Abdomen: Soft to touch, non-tender.  Ext: No cyanosis, edema, or deformity  Skin: No rash. Normal palpation of skin.   Musculoskeletal: Normal digits and nails by inspection. No clubbing.   Neurologic Examination  Mental status/Cognition:  eyes closed, partially opens eyes to loud voice. Does not make eye contact. Speech/language: intubated, mute, does not follow commands. Cranial nerves:   CN II Pupils equal and reactive to light, unable to assess for VF deficits  CN III,IV,VI EOM intact, no gaze preference or deviation, no nystagmus   CN V Corneals intact BL   CN VII Symmetric facial grimace.   CN VIII Opens eyes to voice.   CN IX & X Cough and gag intact   CN XI Head midline   CN XII Does not protrude tongue on command.   Motor:  Muscle bulk: normal, tone normal Moves all extremities spontaneously and atleast antigravity strength in BL uppers. Withdraws BL lower extremities.  Sensation:  Light touch    Pin prick Localizes to proximal pinch in all extremities.   Temperature    Vibration   Proprioception    Coordination/Complex Motor:  - unable to assess.  Labs/Imaging/Neurodiagnostic studies   CBC:  Recent Labs  Lab 05/04/2023 0500 04/16/23 0525 04/17/23 1622 04/18/23 0357  WBC 7.4 5.2 5.9 5.2  NEUTROABS 5.6 3.8  --   --   HGB 8.7* 8.7* 8.5* 8.5*  HCT 30.5* 29.9* 28.9* 29.1*  MCV 105.5* 106.4* 103.2* 103.9*  PLT 244 232 254 258   Basic Metabolic Panel:  Lab Results  Component Value Date   NA 143 04/18/2023   K 4.2 04/18/2023   CO2 20 (L) 04/18/2023   GLUCOSE 341 (H) 04/18/2023   BUN 57 (H) 04/18/2023   CREATININE 1.77 (H) 04/18/2023   CALCIUM 6.8 (L) 04/18/2023   GFRNONAA 43 (L) 04/18/2023   GFRAA  12/17/2006    >60        The eGFR has been calculated using the MDRD equation. This calculation has not been validated in all clinical   Lipid Panel:  Lab Results  Component Value Date   LDLCALC 38 04/14/2023   HgbA1c:  Lab Results  Component Value Date   HGBA1C 7.2 (H) 06/23/2021   Urine Drug Screen:     Component Value Date/Time   LABOPIA NONE DETECTED 04/07/2023 0847   COCAINSCRNUR NONE DETECTED 04/07/2023 0847   LABBENZ POSITIVE (A) 04/07/2023 0847   AMPHETMU NONE DETECTED  04/07/2023 0847   THCU NONE DETECTED 04/07/2023 0847   LABBARB NONE DETECTED 04/07/2023 0847    Alcohol Level     Component Value Date/Time   ETH <10 04/03/2023 1118   INR  Lab Results  Component Value Date   INR 1.9 (H) 04/03/2023   APTT  Lab Results  Component Value Date   APTT 32 04/03/2023   AED levels: No results found for: "PHENYTOIN", "ZONISAMIDE", "LAMOTRIGINE", "LEVETIRACETA"  CT Head without contrast(Personally reviewed):  04/05/23 no acute process. 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  MR Angio head without contrast and Carotid Duplex BL(Personally reviewed): pending  MRI Brain(Personally reviewed):  04/14/23 Acute right PCA territory infarcts in the right occipital lobe with petechial hemorrhage, and right parietal lobe and right frontal lobe infarct.   Echo EF 60-65%. Mild LVH. Left and right atrium severely dilated   Labs LDL 38 ASSESSMENT   DAVEN MONTZ is a 63 y.o. male x of CHF, HTN, GERD, multiple myeloma, permanent A fib on xarelto, OSA, COPD, tobacco and alcohol use who presents  for evaluation of cough, fever and chills, nausea, weakness, fatigue and dyspnea for 2 weeks with some confusion. He underwent a bone marrow biopsy and is positive for myeloma.  Due to continued AMS, he underwent MRI brain which demonstrated a R PCA stroke. 3/23 EEG  with no seizures.  Etiology is pending vessel imaging but my suspicion is that the stroke counts as Xarelto failure. He  seems to have been getting Xarelto in the hospital and despite getting Xarelto, still ended up with R PCA stroke.  RECOMMENDATIONS   - Frequent Neuro checks per stroke unit protocol - Recommend Vascular imaging with MRA Angio Head without contrast and US Carotid doppler. IF his kidney function improves, we should be able to get CTA head and neck. - TTE with EF of 60-65% - Recommend obtaining Lipid panel with LDL - Please start statin if LDL >  70 - Recommend HbA1c to evaluate for diabetes and how well it is controlled. - will do heparin gtt, neuro scale for 48 hours and if no ICH, transition to Eliquis. - SBP goal - aim for gradual normotension. - Recommend Telemetry monitoring for arrythmia - Recommend bedside swallow screen prior to PO intake. - Stroke education booklet - Recommend PT/OT/SLP consult - Recommend Urine Tox screen.   ______________________________________________________________________    Lockie Mola, NP [facility]

## 2023-04-18 NOTE — Inpatient Diabetes Management (Signed)
 Inpatient Diabetes Program Recommendations  AACE/ADA: New Consensus Statement on Inpatient Glycemic Control (2015)  Target Ranges:  Prepandial:   less than 140 mg/dL      Peak postprandial:   less than 180 mg/dL (1-2 hours)      Critically ill patients:  140 - 180 mg/dL   Lab Results  Component Value Date   GLUCAP 304 (H) 04/18/2023   HGBA1C 7.2 (H) 06/23/2021    Review of Glycemic Control  Latest Reference Range & Units 04/17/23 08:10 04/17/23 12:09 04/17/23 15:36 04/17/23 20:17 04/17/23 23:05 04/18/23 04:04  Glucose-Capillary 70 - 99 mg/dL 387 (H) 564 (H) 332 (H) 284 (H) 293 (H) 304 (H)  (H): Data is abnormally high  Diabetes history: DM2 Outpatient Diabetes medications: Farxiga 10 mg daily Current orders for Inpatient glycemic control: Semglee 12 units every day, Novolog 0-15 units Q4H, Osmolite @ 55 ml/hr  Inpatient Diabetes Program Recommendations:    Might consider:  Novolog 3 units tube feed coverage; stop if feeds are held or discontinued.   Will continue to follow while inpatient.  Thank you, Dulce Sellar, MSN, CDCES Diabetes Coordinator Inpatient Diabetes Program [phone] (team pager from 8a-5p)

## 2023-04-18 NOTE — Plan of Care (Signed)
  Problem: Education: Goal: Knowledge of General Education information will improve Description: Including pain rating scale, medication(s)/side effects and non-pharmacologic comfort measures Outcome: Progressing   Problem: Health Behavior/Discharge Planning: Goal: Ability to manage health-related needs will improve Outcome: Progressing   Problem: Clinical Measurements: Goal: Ability to maintain clinical measurements within normal limits will improve Outcome: Progressing Goal: Will remain free from infection Outcome: Progressing Goal: Diagnostic test results will improve Outcome: Progressing Goal: Respiratory complications will improve Outcome: Progressing Goal: Cardiovascular complication will be avoided Outcome: Progressing   Problem: Activity: Goal: Risk for activity intolerance will decrease Outcome: Progressing   Problem: Nutrition: Goal: Adequate nutrition will be maintained Outcome: Progressing   Problem: Coping: Goal: Level of anxiety will decrease Outcome: Progressing   Problem: Elimination: Goal: Will not experience complications related to bowel motility Outcome: Progressing Goal: Will not experience complications related to urinary retention Outcome: Progressing   Problem: Pain Managment: Goal: General experience of comfort will improve and/or be controlled Outcome: Progressing   Problem: Safety: Goal: Ability to remain free from injury will improve Outcome: Progressing   Problem: Skin Integrity: Goal: Risk for impaired skin integrity will decrease Outcome: Progressing   Problem: Education: Goal: Ability to demonstrate management of disease process will improve Outcome: Progressing Goal: Ability to verbalize understanding of medication therapies will improve Outcome: Progressing Goal: Individualized Educational Video(s) Outcome: Progressing   Problem: Activity: Goal: Capacity to carry out activities will improve Outcome: Progressing    Problem: Cardiac: Goal: Ability to achieve and maintain adequate cardiopulmonary perfusion will improve Outcome: Progressing   Problem: Education: Goal: Ability to describe self-care measures that may prevent or decrease complications (Diabetes Survival Skills Education) will improve Outcome: Progressing Goal: Individualized Educational Video(s) Outcome: Progressing   Problem: Coping: Goal: Ability to adjust to condition or change in health will improve Outcome: Progressing   Problem: Fluid Volume: Goal: Ability to maintain a balanced intake and output will improve Outcome: Progressing   Problem: Health Behavior/Discharge Planning: Goal: Ability to identify and utilize available resources and services will improve Outcome: Progressing Goal: Ability to manage health-related needs will improve Outcome: Progressing   Problem: Metabolic: Goal: Ability to maintain appropriate glucose levels will improve Outcome: Progressing   Problem: Nutritional: Goal: Maintenance of adequate nutrition will improve Outcome: Progressing Goal: Progress toward achieving an optimal weight will improve Outcome: Progressing   Problem: Skin Integrity: Goal: Risk for impaired skin integrity will decrease Outcome: Progressing   Problem: Tissue Perfusion: Goal: Adequacy of tissue perfusion will improve Outcome: Progressing   Problem: Activity: Goal: Ability to tolerate increased activity will improve Outcome: Progressing   Problem: Respiratory: Goal: Ability to maintain a clear airway and adequate ventilation will improve Outcome: Progressing   Problem: Role Relationship: Goal: Method of communication will improve Outcome: Progressing   Problem: Safety: Goal: Non-violent Restraint(s) Outcome: Progressing

## 2023-04-18 DEATH — deceased

## 2023-04-19 ENCOUNTER — Inpatient Hospital Stay (HOSPITAL_COMMUNITY)

## 2023-04-19 DIAGNOSIS — G4733 Obstructive sleep apnea (adult) (pediatric): Secondary | ICD-10-CM

## 2023-04-19 DIAGNOSIS — I639 Cerebral infarction, unspecified: Secondary | ICD-10-CM | POA: Diagnosis not present

## 2023-04-19 DIAGNOSIS — G9340 Encephalopathy, unspecified: Secondary | ICD-10-CM | POA: Diagnosis not present

## 2023-04-19 DIAGNOSIS — J449 Chronic obstructive pulmonary disease, unspecified: Secondary | ICD-10-CM | POA: Diagnosis not present

## 2023-04-19 DIAGNOSIS — J9601 Acute respiratory failure with hypoxia: Secondary | ICD-10-CM | POA: Diagnosis not present

## 2023-04-19 DIAGNOSIS — G9341 Metabolic encephalopathy: Secondary | ICD-10-CM | POA: Diagnosis not present

## 2023-04-19 LAB — BASIC METABOLIC PANEL WITH GFR
Anion gap: 7 (ref 5–15)
BUN: 57 mg/dL — ABNORMAL HIGH (ref 8–23)
CO2: 19 mmol/L — ABNORMAL LOW (ref 22–32)
Calcium: 6.9 mg/dL — ABNORMAL LOW (ref 8.9–10.3)
Chloride: 115 mmol/L — ABNORMAL HIGH (ref 98–111)
Creatinine, Ser: 1.62 mg/dL — ABNORMAL HIGH (ref 0.61–1.24)
GFR, Estimated: 47 mL/min — ABNORMAL LOW (ref >60)
Glucose, Bld: 354 mg/dL — ABNORMAL HIGH (ref 70–99)
Potassium: 3.8 mmol/L (ref 3.5–5.1)
Sodium: 141 mmol/L (ref 135–145)

## 2023-04-19 LAB — GLUCOSE, CAPILLARY
Glucose-Capillary: 153 mg/dL — ABNORMAL HIGH (ref 70–99)
Glucose-Capillary: 175 mg/dL — ABNORMAL HIGH (ref 70–99)
Glucose-Capillary: 189 mg/dL — ABNORMAL HIGH (ref 70–99)
Glucose-Capillary: 196 mg/dL — ABNORMAL HIGH (ref 70–99)
Glucose-Capillary: 220 mg/dL — ABNORMAL HIGH (ref 70–99)
Glucose-Capillary: 244 mg/dL — ABNORMAL HIGH (ref 70–99)

## 2023-04-19 LAB — HEPARIN LEVEL (UNFRACTIONATED): Heparin Unfractionated: 0.12 [IU]/mL — ABNORMAL LOW (ref 0.30–0.70)

## 2023-04-19 LAB — MAGNESIUM: Magnesium: 1.8 mg/dL (ref 1.7–2.4)

## 2023-04-19 LAB — HEMOGLOBIN A1C
Hgb A1c MFr Bld: 8.5 % — ABNORMAL HIGH (ref 4.8–5.6)
Mean Plasma Glucose: 197.25 mg/dL

## 2023-04-19 LAB — CBC
HCT: 27.1 % — ABNORMAL LOW (ref 39.0–52.0)
Hemoglobin: 7.9 g/dL — ABNORMAL LOW (ref 13.0–17.0)
MCH: 30.6 pg (ref 26.0–34.0)
MCHC: 29.2 g/dL — ABNORMAL LOW (ref 30.0–36.0)
MCV: 105 fL — ABNORMAL HIGH (ref 80.0–100.0)
Platelets: 271 10*3/uL (ref 150–400)
RBC: 2.58 MIL/uL — ABNORMAL LOW (ref 4.22–5.81)
RDW: 18.9 % — ABNORMAL HIGH (ref 11.5–15.5)
WBC: 4.2 10*3/uL (ref 4.0–10.5)
nRBC: 0.5 % — ABNORMAL HIGH (ref 0.0–0.2)

## 2023-04-19 LAB — PHOSPHORUS: Phosphorus: 2.5 mg/dL (ref 2.5–4.6)

## 2023-04-19 MED ORDER — QUETIAPINE FUMARATE 50 MG PO TABS
50.0000 mg | ORAL_TABLET | Freq: Two times a day (BID) | ORAL | Status: DC
  Administered 2023-04-19 – 2023-04-20 (×2): 50 mg
  Filled 2023-04-19 (×2): qty 1

## 2023-04-19 MED ORDER — FUROSEMIDE 10 MG/ML IJ SOLN
60.0000 mg | Freq: Once | INTRAMUSCULAR | Status: AC
  Administered 2023-04-19: 60 mg via INTRAVENOUS
  Filled 2023-04-19: qty 6

## 2023-04-19 MED ORDER — POTASSIUM CHLORIDE 20 MEQ PO PACK
40.0000 meq | PACK | Freq: Once | ORAL | Status: AC
  Administered 2023-04-19: 40 meq
  Filled 2023-04-19: qty 2

## 2023-04-19 MED ORDER — QUETIAPINE FUMARATE 100 MG PO TABS
100.0000 mg | ORAL_TABLET | Freq: Two times a day (BID) | ORAL | Status: DC
  Administered 2023-04-19: 100 mg
  Filled 2023-04-19: qty 1

## 2023-04-19 MED ORDER — ALBUMIN HUMAN 25 % IV SOLN
25.0000 g | Freq: Once | INTRAVENOUS | Status: AC
  Administered 2023-04-19: 25 g via INTRAVENOUS
  Filled 2023-04-19: qty 100

## 2023-04-19 MED ORDER — MIDAZOLAM HCL 2 MG/2ML IJ SOLN
4.0000 mg | Freq: Once | INTRAMUSCULAR | Status: AC
  Administered 2023-04-19: 4 mg via INTRAVENOUS
  Filled 2023-04-19: qty 4

## 2023-04-19 MED ORDER — METHYLPHENIDATE HCL 5 MG PO TABS
5.0000 mg | ORAL_TABLET | Freq: Two times a day (BID) | ORAL | Status: DC
  Administered 2023-04-19 – 2023-04-20 (×3): 5 mg
  Filled 2023-04-19 (×3): qty 1

## 2023-04-19 MED ORDER — MAGNESIUM SULFATE IN D5W 1-5 GM/100ML-% IV SOLN
1.0000 g | Freq: Once | INTRAVENOUS | Status: AC
  Administered 2023-04-19: 1 g via INTRAVENOUS
  Filled 2023-04-19: qty 100

## 2023-04-19 MED ORDER — INSULIN GLARGINE-YFGN 100 UNIT/ML ~~LOC~~ SOLN
20.0000 [IU] | Freq: Every day | SUBCUTANEOUS | Status: DC
  Administered 2023-04-19: 20 [IU] via SUBCUTANEOUS
  Filled 2023-04-19: qty 0.2

## 2023-04-19 NOTE — Plan of Care (Signed)
  Problem: Health Behavior/Discharge Planning: Goal: Ability to manage health-related needs will improve Outcome: Progressing   Problem: Clinical Measurements: Goal: Ability to maintain clinical measurements within normal limits will improve Outcome: Progressing Goal: Respiratory complications will improve Outcome: Progressing   Problem: Education: Goal: Knowledge of General Education information will improve Description: Including pain rating scale, medication(s)/side effects and non-pharmacologic comfort measures Outcome: Not Progressing

## 2023-04-19 NOTE — Progress Notes (Signed)
RT transported pt to and from CT without complications ? ?

## 2023-04-19 NOTE — Progress Notes (Cosign Needed)
 RT transported pt on vent to MRI and back to ICU with no complications

## 2023-04-19 NOTE — Progress Notes (Signed)
 Carotid duplex has been completed.   Results can be found under chart review under CV PROC. 04/19/2023 5:27 PM Saleema Weppler RVT, RDMS

## 2023-04-19 NOTE — Progress Notes (Signed)
 Overall, I really do not think there is much change in the status for Ronnie Dunn.  He had a CT scan of the brain yesterday.  This seemed to show a subacute right PCA stroke.  Pressure now is on heparin.  He has atrial fibrillation.  He was on Xarelto.  He is still intubated.  I am unsure what type of mental status he has or what he will progress to.  Neurology has seen him again.  His blood sugars are quite high.  Today blood sugar was 354.  His BUN 57 creatinine 1.62.  Calcium 6.9 with albumin of 1.5.  His white count 4.2.  Hemoglobin 7.9.  Platelet count 271,000.  He will be due for his next treatment tomorrow for the myeloma.  He has IgG kappa myeloma.  The IgG level came back and was a little over 5000 mg/dL.  I would have to think that the treatments will help bring this down nicely.  Again, I do not know what type of cognitive state he will have when all of this is completed.  I know that he does have other issues.  The atrial fibrillation certainly is a problem.  Vital signs show temperature of 100.2.  Pulse 91.  Blood pressure 120/51.  He does have a low temperature.  All cultures are negative.  His lungs sound pretty clear bilaterally.  He has decent breath sounds bilaterally.  Cardiac exam irregular rate and irregular rhythm consistent with the atrial fibrillation.  Abdomen is soft and obese.  Bowel sounds are present but slightly decreased.  There is no guarding or rebound tenderness.  Extremity shows no clubbing, cyanosis or edema.  Neurological exam shows that he is sedated.  From my perspective, he has IgG kappa myeloma.  Again I just would not believe that this is the etiology for his mental status change.  I do not think this is the etiology for the CVA.  I do not think this is the etiology for his atrial fibrillation.  He is still being treated aggressively.  He is on IV heparin.  I would go ahead and do his next week of treatment tomorrow.  He is a bit anemic.  I would  just watch this for right now.  The temperatures might be from CNS issues.  I know this is incredible complicated.  I really do appreciate all the help that he is getting from the staff in the ICU.  I know that they are working really hard to try to improve his quality of life.  Ronnie Bach, MD   Ronnie Dunn 17:14

## 2023-04-19 NOTE — Progress Notes (Signed)
 Updated patient's parents  They will get in touch with his siblings to let them know his current status  We will have further conversations following his MRA  They do corroborate the spouses message about patient not wanting to live in a nursing home with supportive devices

## 2023-04-19 NOTE — Progress Notes (Signed)
 Physical Therapy Discharge Patient Details Name: Ronnie Dunn MRN: [id number] DOB: 02/19/1960 Today's Date: 04/19/2023 Time:  -     Patient discharged from PT services secondary to medical decline - will need to re-order PT to resume therapy services.  Please see latest therapy progress note for current level of functioning and progress toward goals.    Progress and discharge plan discussed with patient and/or caregiver: Patient unable to participate in discharge planning and no caregivers available  GP   Blanchard Kelch PT Acute Rehabilitation Services Office [phone]    [name] 04/19/2023, 8:37 AM

## 2023-04-19 NOTE — Progress Notes (Signed)
 Updated patient's spouse at bedside  Ongoing goals of care discussions  Mental status remains poor Appears to be weaning on pressure support Writhing around in bed -Nonpurposeful, does not follow commands  Spouse stated patient's parents may come in later today  Has MRI scheduled for about 2:00

## 2023-04-19 NOTE — Progress Notes (Signed)
 eLink Physician-Brief Progress Note Patient Name: Ronnie Dunn DOB: January 08, 1961 MRN: [id number]   Date of Service  04/19/2023  HPI/Events of Note  Patient recently went to CT and unfortunately as his OG-tube got displaced by 10 cm.  eICU Interventions  Advance OG tube again by 10 cm, repeat KUB     Intervention Category Minor Interventions: Routine modifications to care plan (e.g. PRN medications for pain, fever)  Tavie Haseman 04/19/2023, 2:12 AM

## 2023-04-19 NOTE — Plan of Care (Signed)
 For detailed note, please see Dr. Tollie Eth consult note yesterday. In brief, 63 year old male with history of CHF, hypertension, A-fib on Xarelto, OSA, MM, smoker and alcohol abuse admitted for respiratory failure and newly diagnosed multiple myeloma.  He also had confusion and altered mental status.  MRI showed right PCA stroke with petechial hemorrhage.  EEG no seizure.  Intubated for airway protection given nose bleeding.  Etiology for stroke most likely from A-fib even on Xarelto.  Repeat CT yesterday showed no frank hematoma.  Heparin IV started.  A1c 8.5, LDL 38.  EF 60 to 65%.  Pending MRI and carotid Doppler.  Monitor CBC and clinical sign of bleeding.  If tolerating heparin IV well for 48 hours, may consider switch to Eliquis if no further procedure planned.  Continue statin.  Will follow.  Marvel Plan, MD PhD Stroke Neurology 04/19/2023 12:13 PM

## 2023-04-19 NOTE — Progress Notes (Signed)
 NAME:  Ronnie Dunn, MRN:  [id number], DOB:  Oct 28, 1960, LOS: 16 ADMISSION DATE:  04/03/2023, CONSULTATION DATE:  04/07/23 REFERRING MD:  Jerolyn Center CHIEF COMPLAINT:  AMS   BRIEF  Ronnie Dunn is a 63 y.o. male who has a PMH as below including but not limited to COPD, OSA on CPAP, chronic diastolic CHF, HTN, HLD, PAF on Xarelto, tobacco dependence, alcohol use.  He is followed by [name] as an outpatient. He presented to [facility] ED on 3/17 with cough, fever/chills, nausea, weakness, fatigue, dyspnea x 2 weeks.  He had some confusion on and off and decreased appetite.  He had also complained of low back pain which was new and was not associated with any recent fall or trauma.  He had productive sputum, green in color.  He recently completed a course of azithromycin as an outpatient but had no relief with this.  Due to ongoing symptoms, he presented to the ED for further workup.  Initial chest x-ray showed bilateral interstitial opacities.  He was started on IV Lasix.  Renal function worsened, he had renal ultrasound on 3/19 that showed normal kidneys.  Follow-up chest x-ray showed cardiomegaly and ongoing interstitial opacities with small pleural effusions.  Diuresis was continued; however, renal function worsened further.  Was initially on vancomycin and cefepime however cefepime was stopped due to altered mental status.  This was switched to ceftriaxone on 3/19.  Overnight 3/20, he developed A-fib RVR.  He received Lopressor with improvement in heart rate.  Early a.m. 3/21 he had altered mental status and new fever to 102.9 rectally.  He was subsequently transferred to the ICU and PCCM and infectious disease were asked to see in consultation.  Upon arrival to the ICU, he is minimally responsive but is protecting his airway.  He has heart rates in the 110s to 120s, blood pressure stable.  Mucous membranes are very dry.  ABG was obtained and was normal (7.52/41/21). He has clear urine in his Foley.   Antibiotics were escalated from ceftriaxone to Zosyn.  Infectious disease has seen him and we have agreed to assess CT chest/abdomen/pelvis.  Pertinent  Medical History:  has Seizure (HCC); Hyponatremia; Alcohol use disorder, moderate, dependence (HCC); Class 1 obesity; Essential hypertension; Type 2 diabetes mellitus without complication, without long-term current use of insulin (HCC); Chronic obstructive pulmonary disease (HCC); Gastroesophageal reflux disease; Lower extremity edema; Permanent atrial fibrillation (HCC); OSA (obstructive sleep apnea); Chronic heart failure with preserved ejection fraction (HCC); Seizures (HCC); Protein-calorie malnutrition, moderate (HCC); Hypomagnesemia; Alcohol abuse; Obesity (BMI 30-39.9); Noncompliance with medication regimen; Aortic root dilation (HCC); Acquired thrombophilia (HCC); Pneumonia due to infectious organism; AKI (acute kidney injury) (HCC); Acute metabolic encephalopathy; Acute encephalopathy; Hypokalemia; Acute respiratory failure with hypoxia (HCC); 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); and Sepsis (HCC) on their problem list.  Significant Hospital Events: Including procedures, antibiotic start and stop dates in addition to other pertinent events   3/17 admit 3.19 - BNPP 800 3/20 - HFNC O2 need 3/21 - Altered but protecting airway. Vitals stable.Marland Kitchen 7L [place] O2 need. BN 275 (down from 813) ID Consult: Fever, sepsis of unknown source--potentially due to aspiration in context of encephalopathy. Alcoholic liver disease. 3/AKI. 4Encephalopathy --presumably from alcohol withdrawal, hepatic encaphalopathy though no cirrhosis on CT at [facility] in 2019 3/22 - HEme conslt - cocern for myleoma v Waldenstrom ( .  His total protein is quite high.  His calcium is quite high.  His albumin is low.) Getting seruum viscolity due to Acue  encephalpthy. Pan CT   - unremarlkable. ID thinks low odds for infection. ID recommending MRI. RN/Wife report Obutndatioin with  restlessness - on prn ativan. Last drink > 2 weeks go . Wife describes 2022 admit for Low NA - and seziure like activity but no EEG or neuro conslt in record.  Started Precedex.  Antibiotics all stopped by ID 3/23 cerebral EEG without any seizures but showing encephalopathy.  MRI brain could not be done because patient desaturated while supine. Per RN "His blood is too thick to obtain a serum protein, parathyroid, viscosity. " Ammonia 65. 3/25 bone marrow biopsy  3/27 bone marrow biopsy positive for myeloma, afternoon development of epi taxis after core track placement attempt this coupled with lethargy prompted intubation for airway protection 3/28 no issues overnight, remains sedated on ventilator, pending MRI brain 3/31 DNR 4/1 bradycardic/ hypotensive w/precedex requiring brief NE support  Interim History / Subjective:  CTH head overnight stable  Still remains agitated on fentanyl MRA planned for today Tmax 100.9  Objective:  Blood pressure (!) 106/51, pulse 91, temperature (!) 100.9 F (38.3 C), resp. rate 18, height 6\' 2"  (1.88 m), weight 113.6 kg, SpO2 98%.    Vent Mode: PRVC FiO2 (%):  [40 %] 40 % Set Rate:  [18 bmp] 18 bmp Vt Set:  [650 mL] 650 mL PEEP:  [5 cmH20] 5 cmH20 Plateau Pressure:  [14 cmH20-18 cmH20] 14 cmH20   Intake/Output Summary (Last 24 hours) at 04/19/2023 0728 Last data filed at 04/19/2023 0701 Gross per 24 hour  Intake 2964.11 ml  Output 2150 ml  Net 814.11 ml   Filed Weights   04/17/23 0500 04/18/23 0453 04/19/23 0500  Weight: 111.5 kg 111.3 kg 113.6 kg   Examination: Fent 150 General:  Critically ill older male on MV in NAD, intermittently agitated HEENT: MM pink/moist, poor dentition, ETT/ OGT, pupils 3/r, scleral edema Neuro: opens eyes, does not track or follow commands, intermittent and spont agitation in all extremities, w/d to noxious stimuli, bilateral soft wrist restraints CV: irir, afib rate 90s, RUE PICC PULM:  good cough, clearance of  moderate light tan secretions, minimally coarse, no wheeze, doing well on PSV 5/5 GI: soft, bs+, NT, foley, FMS Extremities: warm/dry, generalized edema  Skin: no rashes  Tmax 100.9  Labs> BUN/ sCr 57/ 1.77 >57/ 1.62, bicarb 19, Cl improving, H/H 8.5/ 29.1> 7.9/ 105  UOP 2.1L /24hrs +770 Net +18L   Labs/imaging personally reviewed:  CT head 3/19 > neg. Renal US 3/19 > neg. CT chest/abd/pelv 3/21 > infrarenal AAA Cerebral EEG 04/08/2023: No seizures. CT chest without con 3/23 collapse consolidation left lower lobe , scattered lytic lesions; ascending aortic aneurysm MRI 3/28 with Acute right PCA territory infarcts in the right occipital lobe with petechial hemorrhage and Additional acute infarct in the right parietal lobe and small acute infarct in the right frontal lobe  Assessment & Plan:   Acute metabolic encephalopathy  - Multifactorial 2/2 hyperammonemia, EtOH, new R sided strokes - CT head neg, TSH, ABG normal. - EEG 3/22 & 3/25 No seizures but shows encephalopathy. - TTE w/ EF 60-65% HX OF ETOH Acute Right PCA territory infarcts: Presumed embolic with afib given multiple vascular territories despite Xarelto, per MRI 3/28 P: - neuro following; appreciate recs - MRA brain and US carotid dopplers ordered, defer CTA due to AKI - CTH overnight stable, no acute hemorrhage - if imaging/ neuro checks stable after 48hrs on heparin (started 4/1), plans to transition to eliquis - cont  to limit sedating meds/ delirium precautions  - statin, ASA -thiamine, folic acid, mvi - Qtc ok, increase seroquel 50> 100mg  BID. Monitor QTC - intermittent check ammonia.  Cont lactulose -neuroprotective measures- normothermia, euglycemia, HOB greater than 30, head in neutral alignment, normocapnia, normoxia.  -PT/OT/SLP when appropriate   Acute hypoxic respiratory failure  - Secondary to above + baseline COPD (though not on O2), OSA.  - Baseline 2 L nasal cannula at night - Intubated  afternoon 3/27 after development of epistaxis for airway protection Hx COPD, OSA, tobacco dependence Staph lugdunensis pna P: - weaning well this am, agitation/ mental status remain barrier to extubation.   - full MV support - VAP/ PPI  - following repeat 3/31 trach asp>  - day 3/x vanc - PAD protocol> fentanyl gtt, prn versed.  Did not tolerate precedex 4/1 - triple therapy nebs - diurese today    Newly diagnosed myeloma Elevated Protein -Bone marrow biopsy positive for myeloma with IgG kappa myeloma seen P: - Oncology following, appreciate assistance, next treatment possibly 4/3 - Chemotherapy precautions - HZV ppx started PO 3/29  AKI on CKD3a Hypokalemia Hypercalcemia (resolved) P: - slowly improving sCr, UOP stable.  Remains net +18L.  Albumin/ lasix today - Trend BMP / urinary output -Replace electrolytes as indicated -Avoid nephrotoxic agents, ensure adequate renal perfusion  Hypoalbuminemia Protein calorie malnutrition P: - advance OGT by 10 and recheck KUB.  Restart EN as able, optimize  Hx dCHF A.fib on Xarelto Essential HTN HLD -Echo 04/08/2023: Normal left ventricle ejection fraction but right ventricular dysfunction present and is new compared to July 2024 likely related to positive pressure ventilation and L sided disease and chronic hypoxemia P: -Tele monitoring, rate remains controlled - diurese today - heparin gtt per pharmacy ( started 4/1), likely will transition to Eliquis when ok with neuro if imaging stable, see above - statin and asa - cont amio - optimize electrolytes - daily weights; strict I/O's  T2DM Plan: - CBG q4 - increase semglee 15> 20units BID - cont TF coverage, SSI resistant prn  Ascending and infrarenal aortic aneurysm - noted on imaging - f/u annually output per recs   Best practice (evaluated daily):  Diet/type: tubefeeds  DVT prophylaxis: SCD; heparin gtt Pressure ulcer(s): pressure ulcer assessment deferred  GI  prophylaxis: Pepcid Lines: N/A Foley:  Yes, and it is still needed Code Status:  DNR Last date of multidisciplinary goals of care discussion:  3/31 updated wife at bedside. States she does not think patient would want long term ventilation/tracheostomy. She spoke with family and decided to make patient DNR. 4/1 wife updated; she is okay with giving time limited trial on vent. If no better by this weekend/early next week would not want to pursue trach and agrees with one-way extubation. - pending update 4/2> updated by Dr. Wynona Neat at bedside   CRITICAL CARE Performed by: Posey Boyer   Total critical care time: 38 minutes     Posey Boyer, MSN, AG-ACNP-BC [facility] 04/19/2023, 7:28 AM  See Amion for pager If no response to pager , please call [phone] until 7pm After 7:00 pm call Elink  [phone]

## 2023-04-20 ENCOUNTER — Inpatient Hospital Stay (HOSPITAL_COMMUNITY)

## 2023-04-20 ENCOUNTER — Encounter (HOSPITAL_COMMUNITY): Payer: Self-pay | Admitting: Hematology & Oncology

## 2023-04-20 DIAGNOSIS — G4733 Obstructive sleep apnea (adult) (pediatric): Secondary | ICD-10-CM | POA: Diagnosis not present

## 2023-04-20 DIAGNOSIS — J449 Chronic obstructive pulmonary disease, unspecified: Secondary | ICD-10-CM | POA: Diagnosis not present

## 2023-04-20 DIAGNOSIS — G9341 Metabolic encephalopathy: Secondary | ICD-10-CM | POA: Diagnosis not present

## 2023-04-20 DIAGNOSIS — J9601 Acute respiratory failure with hypoxia: Secondary | ICD-10-CM | POA: Diagnosis not present

## 2023-04-20 DIAGNOSIS — G9340 Encephalopathy, unspecified: Secondary | ICD-10-CM | POA: Diagnosis not present

## 2023-04-20 LAB — TYPE AND SCREEN
ABO/RH(D): B POS
Antibody Screen: NEGATIVE

## 2023-04-20 LAB — CULTURE, RESPIRATORY W GRAM STAIN

## 2023-04-20 LAB — GLUCOSE, CAPILLARY
Glucose-Capillary: 230 mg/dL — ABNORMAL HIGH (ref 70–99)
Glucose-Capillary: 272 mg/dL — ABNORMAL HIGH (ref 70–99)
Glucose-Capillary: 252 mg/dL — ABNORMAL HIGH (ref 70–99)
Glucose-Capillary: 272 mg/dL — ABNORMAL HIGH (ref 70–99)
Glucose-Capillary: 191 mg/dL — ABNORMAL HIGH (ref 70–99)
Glucose-Capillary: 133 mg/dL — ABNORMAL HIGH (ref 70–99)
Glucose-Capillary: 107 mg/dL — ABNORMAL HIGH (ref 70–99)

## 2023-04-20 LAB — BASIC METABOLIC PANEL WITH GFR
Anion gap: 10 (ref 5–15)
BUN: 58 mg/dL — ABNORMAL HIGH (ref 8–23)
CO2: 16 mmol/L — ABNORMAL LOW (ref 22–32)
Calcium: 7.3 mg/dL — ABNORMAL LOW (ref 8.9–10.3)
Chloride: 114 mmol/L — ABNORMAL HIGH (ref 98–111)
Creatinine, Ser: 1.77 mg/dL — ABNORMAL HIGH (ref 0.61–1.24)
GFR, Estimated: 43 mL/min — ABNORMAL LOW (ref >60)
Glucose, Bld: 390 mg/dL — ABNORMAL HIGH (ref 70–99)
Potassium: 4 mmol/L (ref 3.5–5.1)
Sodium: 140 mmol/L (ref 135–145)

## 2023-04-20 LAB — HEPARIN LEVEL (UNFRACTIONATED)
Heparin Unfractionated: 0.14 [IU]/mL — ABNORMAL LOW (ref 0.30–0.70)
Heparin Unfractionated: 0.24 [IU]/mL — ABNORMAL LOW (ref 0.30–0.70)

## 2023-04-20 LAB — CBC
HCT: 26.4 % — ABNORMAL LOW (ref 39.0–52.0)
Hemoglobin: 7.6 g/dL — ABNORMAL LOW (ref 13.0–17.0)
MCH: 30.5 pg (ref 26.0–34.0)
MCHC: 28.8 g/dL — ABNORMAL LOW (ref 30.0–36.0)
MCV: 106 fL — ABNORMAL HIGH (ref 80.0–100.0)
Platelets: 310 10*3/uL (ref 150–400)
RBC: 2.49 MIL/uL — ABNORMAL LOW (ref 4.22–5.81)
RDW: 19 % — ABNORMAL HIGH (ref 11.5–15.5)
WBC: 3.6 10*3/uL — ABNORMAL LOW (ref 4.0–10.5)
nRBC: 0 % (ref 0.0–0.2)

## 2023-04-20 LAB — PHOSPHORUS: Phosphorus: 1.7 mg/dL — ABNORMAL LOW (ref 2.5–4.6)

## 2023-04-20 LAB — AMMONIA: Ammonia: 25 umol/L (ref 9–35)

## 2023-04-20 LAB — ABO/RH: ABO/RH(D): B POS

## 2023-04-20 LAB — MAGNESIUM: Magnesium: 1.7 mg/dL (ref 1.7–2.4)

## 2023-04-20 MED ORDER — HYDROMORPHONE HCL 1 MG/ML IJ SOLN
0.5000 mg | INTRAMUSCULAR | Status: DC
  Administered 2023-04-20 – 2023-04-21 (×4): 0.5 mg via INTRAVENOUS
  Filled 2023-04-20 (×4): qty 1

## 2023-04-20 MED ORDER — MIDAZOLAM HCL 2 MG/2ML IJ SOLN
2.0000 mg | INTRAMUSCULAR | Status: DC | PRN
  Filled 2023-04-20: qty 2

## 2023-04-20 MED ORDER — INSULIN GLARGINE-YFGN 100 UNIT/ML ~~LOC~~ SOLN
25.0000 [IU] | Freq: Every day | SUBCUTANEOUS | Status: DC
  Filled 2023-04-20: qty 0.25

## 2023-04-20 MED ORDER — OXYCODONE HCL 5 MG PO TABS
5.0000 mg | ORAL_TABLET | Freq: Four times a day (QID) | ORAL | Status: DC
  Administered 2023-04-20 (×2): 5 mg
  Filled 2023-04-20 (×2): qty 1

## 2023-04-20 MED ORDER — LACTULOSE 10 GM/15ML PO SOLN
30.0000 g | Freq: Two times a day (BID) | ORAL | Status: DC
  Administered 2023-04-20: 30 g
  Filled 2023-04-20: qty 45

## 2023-04-20 MED ORDER — MIDAZOLAM HCL 2 MG/2ML IJ SOLN
1.0000 mg | INTRAMUSCULAR | Status: DC | PRN
  Administered 2023-04-20 – 2023-04-21 (×4): 1 mg via INTRAVENOUS
  Filled 2023-04-20 (×4): qty 2

## 2023-04-20 MED ORDER — METOPROLOL TARTRATE 25 MG PO TABS
25.0000 mg | ORAL_TABLET | Freq: Two times a day (BID) | ORAL | Status: DC
  Administered 2023-04-20: 25 mg
  Filled 2023-04-20: qty 1

## 2023-04-20 MED ORDER — SODIUM BICARBONATE 650 MG PO TABS
650.0000 mg | ORAL_TABLET | Freq: Three times a day (TID) | ORAL | Status: DC
  Administered 2023-04-20 (×2): 650 mg
  Filled 2023-04-20 (×2): qty 1

## 2023-04-20 MED ORDER — POTASSIUM PHOSPHATES 15 MMOLE/5ML IV SOLN
30.0000 mmol | Freq: Once | INTRAVENOUS | Status: AC
  Administered 2023-04-20: 30 mmol via INTRAVENOUS
  Filled 2023-04-20: qty 10

## 2023-04-20 MED ORDER — FUROSEMIDE 10 MG/ML IJ SOLN
80.0000 mg | Freq: Two times a day (BID) | INTRAMUSCULAR | Status: AC
  Administered 2023-04-20 (×2): 80 mg via INTRAVENOUS
  Filled 2023-04-20 (×2): qty 8

## 2023-04-20 MED ORDER — HYDROMORPHONE HCL 1 MG/ML IJ SOLN
0.5000 mg | INTRAMUSCULAR | Status: DC | PRN
  Administered 2023-04-20: 0.5 mg via INTRAVENOUS
  Filled 2023-04-20: qty 1

## 2023-04-20 MED ORDER — INSULIN ASPART 100 UNIT/ML IJ SOLN
5.0000 [IU] | INTRAMUSCULAR | Status: DC
  Administered 2023-04-20 (×4): 5 [IU] via SUBCUTANEOUS

## 2023-04-20 MED ORDER — OXYCODONE HCL 5 MG PO TABS: 5.0000 mg | ORAL_TABLET | ORAL | Status: DC

## 2023-04-20 NOTE — Progress Notes (Signed)
 PHARMACY - ANTICOAGULATION CONSULT NOTE  Pharmacy Consult for Heparin Indication: atrial fibrillation  No Known Allergies  Patient Measurements: Height: 6\' 2"  (188 cm) Weight: 113.6 kg (250 lb 7.1 oz) IBW/kg (Calculated) : 82.2 HEPARIN DW (KG): 103.8  Vital Signs: Temp: 100.6 F (38.1 C) (04/03 1000) Temp Source: Bladder (04/03 0800) BP: 129/54 (04/03 1000) Pulse Rate: 91 (04/03 1000)  Labs: Recent Labs    04/18/23 0357 04/18/23 2043 04/19/23 0509 04/20/23 0024 04/20/23 0514 04/20/23 0850  HGB 8.5*  --  7.9*  --  7.6*  --   HCT 29.1*  --  27.1*  --  26.4*  --   PLT 258  --  271  --  310  --   HEPARINUNFRC  --    < > 0.12* 0.14*  --  0.24*  CREATININE 1.77*  --  1.62*  --  1.77*  --    < > = values in this interval not displayed.    Estimated Creatinine Clearance: 57.3 mL/min (A) (by C-G formula based on SCr of 1.77 mg/dL (H)).   Medications:  Scheduled:   acyclovir  400 mg Per Tube BID   arformoterol  15 mcg Nebulization BID   atorvastatin  40 mg Per Tube Daily   budesonide (PULMICORT) nebulizer solution  0.5 mg Nebulization BID   Chlorhexidine Gluconate Cloth  6 each Topical Daily   feeding supplement (PROSource TF20)  60 mL Per Tube TID   folic acid  1 mg Per Tube Daily   free water  100 mL Per Tube Q4H   furosemide  80 mg Intravenous Q12H   insulin aspart  0-20 Units Subcutaneous Q4H   insulin aspart  5 Units Subcutaneous Q4H   insulin glargine-yfgn  25 Units Subcutaneous QHS   lactulose  30 g Per Tube BID   methylphenidate  5 mg Per Tube BID WC   metoprolol tartrate  25 mg Per Tube BID   multivitamin  15 mL Per Tube Daily   nicotine  21 mg Transdermal Daily   mouth rinse  15 mL Mouth Rinse Q2H   oxyCODONE  5 mg Per Tube Q6H   pantoprazole (PROTONIX) IV  40 mg Intravenous Q24H   QUEtiapine  50 mg Per Tube BID   revefenacin  175 mcg Nebulization Daily   sodium bicarbonate  650 mg Per Tube TID   sodium chloride flush  10-40 mL Intracatheter Q12H    sodium chloride flush  3-10 mL Intravenous Q12H   thiamine  100 mg Per Tube Daily   Or   thiamine  100 mg Intravenous Daily   Infusions:   feeding supplement (OSMOLITE 1.5 CAL) 55 mL/hr at 04/20/23 0532   fentaNYL infusion INTRAVENOUS 50 mcg/hr (04/20/23 0532)   heparin 2,500 Units/hr (04/20/23 0532)   norepinephrine (LEVOPHED) Adult infusion Stopped (04/18/23 1349)   potassium PHOSPHATE IVPB (in mmol) 30 mmol (04/20/23 0826)   vancomycin Stopped (04/19/23 2236)   Assessment: 4 yoM admitted 3/17 with PMH of Afib on Xarelto anticoagulation. Xarelto initially resumed, but held 3/28 due to acute ischemic stroke. Pharmacy consulted 4/1 to start Heparin for Afib, no bolus, with CVA dosing.  Baseline INR elevated d/t Xarelto; aPTT WNL Prior anticoagulation: Xarelto 20 mg daily; LD 3/16  Significant events: - 4/3: Day RN noted that heparin (supposed to be running at 1850 units/hr) had actually ben running at 2500 units/hr since just before 5 am. Documentation in Epic does reflect the higher rate, but no reason given.  Today,  04/20/2023: CBC: Hgb low and continues to drift down, but still remains > 7.0; Plt stable WNL Most recent heparin level still SUBtherapeutic despite running for at least 4 hrs at much higher rate of 2500 units/hr (see events above) SCr elevated but stable around 1.7 No bleeding or infusion issues per nursing  Goal of Therapy: Heparin level 0.3-0.5 units/ml  (low goal d/t recent CVA)  Monitor platelets by anticoagulation protocol: Yes  Plan: Given heparin infusing at 2500 units/hr for 4 of the 6 hrs prior to drawing blood, the low heparin level is mostly reflective of this higher rate. Based on the above, will allow heparin to continue running at 2500 units/hr for a full 8 hr (allow extra distribution time d/t weight) before rechecking level. Continue heparin IV infusion at 2500 units/hr Recheck heparin level in 8 hrs Daily CBC, daily heparin level once stable Monitor  for signs of bleeding or thrombosis  Bernadene Person, PharmD, BCPS [phone] 04/20/2023, 10:39 AM

## 2023-04-20 NOTE — Progress Notes (Signed)
 Spoke with patient's family again-his parents and spouse  We will proceed with one-way extubation  They feel they just want to give him a chance to see whether he will be able to breathe by himself  They are comfortable with delaying sedation  Can be started on/increase fentanyl, Versed drip if needed if he is not doing well -Then can transition to full comfort measures

## 2023-04-20 NOTE — Progress Notes (Signed)
 Provided emotional and spiritual support to Ronnie Dunn through prayer, listening and presence.  Bjorn Loser shared about their grandchildren that they share and about her relationship with Fayrene Fearing. Adriell's parents will be coming this evening along with Rhonda's daughter when Holger is extubated.  I will arrange for chaplain presence at that time, but please page if needs arise before then.

## 2023-04-20 NOTE — Progress Notes (Addendum)
 eLink Physician-Brief Progress Note Patient Name: Ronnie Dunn DOB: Dec 28, 1960 MRN: [id number]   Date of Service  04/20/2023  HPI/Events of Note  Patient extubated earlier today and enteral access was pulled at the same time, no current enteral access but numerous meds were transitioned to oral scheduled in place of IV drips.    eICU Interventions  Will need to replace his enteral access tonight to optimize his chances of one-way extubation.  Discontinue fentanyl Vent orders   2132 -family wishes to hold on NG tube for the time being with anticipation of the may transition to comfort measures tomorrow.  Team to readdress goals of care in the morning.  Okay to hold acyclovir, atorvastatin, metoprolol, methylphenidate, quetiapine, lactulose and vitamins/supplements.  Replace scheduled oxycodone with scheduled IV Dilaudid.  Space out midazolam, add as needed Dilaudid for comfort  2229 -tube feeds are off, maintain long-acting insulin but discontinue every 4 hours scheduled insulin.  Maintain as needed coverage  Intervention Category Intermediate Interventions: Respiratory distress - evaluation and management Minor Interventions: Routine modifications to care plan (e.g. PRN medications for pain, fever)  Ronnie Dunn 04/20/2023, 9:11 PM

## 2023-04-20 NOTE — Consult Note (Signed)
 WOC Nurse Consult Note: Reason for Consult: back and buttock wound Patient inpatient 17 days.  Wound type: Pressure Injury POA: Yes/No/NA Measurement: Wound bed: Drainage (amount, consistency, odor)  Periwound: Dressing procedure/placement/frequency:   WOC Nurse team will follow with you and see patient within 10 days for wound assessments.  Please notify WOC nurses of any acute changes in the wounds or any new areas of concern [name] [facility] MSN, RN,CWOCN, CNS, CWON-AP [phone]

## 2023-04-20 NOTE — Progress Notes (Signed)
 Ongoing goals of care discussions with patient's family  Had a conversation with patient's parents mom and dad and his spouse  Consensus is that patient will not want to be kept alive artificially and we are leaning towards compassionate withdrawal of care  They will let us know once they have discussed with other family members when we will proceed with compassionate withdrawal.  Questions were answered  Discussed ongoing treatments Discussed his encephalopathic state despite correction of electrolyte derangement, metabolic derangements, weaning of sedatives MRI, CT scan findings discussed with them.  They are clear that he will not want to be kept alive with a tracheostomy tube which he will never have wanted, a PEG feeding tube which she will not have wanted and does not want to end up in a nursing facility  Despite ongoing care, patient remains encephalopathic   Will continue to follow

## 2023-04-20 NOTE — Procedures (Signed)
 Extubation Procedure Note  Patient Details:   Name: Ronnie Dunn DOB: May 11, 1960 MRN: [id number]   Airway Documentation:  Airway 7.5 mm (Active)  Secured at (cm) 25 cm 04/20/23 1547  Measured From Lips 04/20/23 1547  Secured Location Left 04/20/23 1547  Secured By [employer] 04/20/23 1547  Bite Block No 04/20/23 1547  Tube Holder Repositioned Yes 04/20/23 1547  Prone position No 04/20/23 0845  Cuff Pressure (cm H2O) MOV (Manual Technique) 04/20/23 1547  Site Condition Dry 04/20/23 1547   Vent end date: (not recorded) Vent end time: (not recorded)   Evaluation  O2 sats: currently acceptable Complications: No apparent complications Patient did tolerate procedure well. Bilateral Breath Sounds: Clear, Diminished   No Pt extubated one way per MD order Pt placed on 10L Venti-mask RN at bedside   Lajuana Carry A Laryah Neuser 04/20/2023, 5:22 PM

## 2023-04-20 NOTE — Progress Notes (Signed)
 This RN noted heparin gtt running at 2500 units/hour Ordered dose: 1850 units/hour. This RN changed to ordered dose.  Pharmacy notified. MD notified.

## 2023-04-20 NOTE — Progress Notes (Addendum)
 PHARMACY - ANTICOAGULATION CONSULT NOTE  Pharmacy Consult for Heparin Indication: atrial fibrillation  No Known Allergies  Patient Measurements: Height: 6\' 2"  (188 cm) Weight: 113.6 kg (250 lb 7.1 oz) IBW/kg (Calculated) : 82.2 HEPARIN DW (KG): 103.8  Vital Signs: Temp: 101.8 F (38.8 C) (04/02 1800) Temp Source: Bladder (04/02 1600) BP: 94/44 (04/02 1800) Pulse Rate: 96 (04/02 1800)  Labs: Recent Labs    04/17/23 0318 04/17/23 1622 04/17/23 1622 04/18/23 0357 04/18/23 2043 04/19/23 0509 04/20/23 0024  HGB  --  8.5*   < > 8.5*  --  7.9*  --   HCT  --  28.9*  --  29.1*  --  27.1*  --   PLT  --  254  --  258  --  271  --   HEPARINUNFRC  --   --   --   --  <0.10* 0.12* 0.14*  CREATININE 1.66*  --   --  1.77*  --  1.62*  --    < > = values in this interval not displayed.    Estimated Creatinine Clearance: 62.6 mL/min (A) (by C-G formula based on SCr of 1.62 mg/dL (H)).   Medical History: Past Medical History:  Diagnosis Date   Chronic heart failure with preserved ejection fraction (HCC) 06/01/2021   Chronic obstructive pulmonary disease (HCC) 05/28/2021   Class 1 obesity 03/29/2020   Essential hypertension 03/29/2020   Gastroesophageal reflux disease 05/28/2021   Hyponatremia 03/29/2020   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) 04/13/2023   OSA (obstructive sleep apnea) 06/01/2021   Permanent atrial fibrillation (HCC) 05/28/2021   Thoracic aortic aneurysm without rupture (HCC) 06/01/2021   Type 2 diabetes mellitus without complication, without long-term current use of insulin (HCC) 05/28/2021    Medications:  Scheduled:   acyclovir  400 mg Per Tube BID   arformoterol  15 mcg Nebulization BID   atorvastatin  40 mg Per Tube Daily   budesonide (PULMICORT) nebulizer solution  0.5 mg Nebulization BID   Chlorhexidine Gluconate Cloth  6 each Topical Daily   feeding supplement (PROSource TF20)  60 mL Per Tube TID   folic acid  1 mg Per Tube Daily   free water   100 mL Per Tube Q4H   insulin aspart  0-20 Units Subcutaneous Q4H   insulin aspart  3 Units Subcutaneous Q4H   insulin glargine-yfgn  20 Units Subcutaneous QHS   lactulose  30 g Per Tube Daily   methylphenidate  5 mg Per Tube BID WC   multivitamin  15 mL Per Tube Daily   nicotine  21 mg Transdermal Daily   mouth rinse  15 mL Mouth Rinse Q2H   pantoprazole (PROTONIX) IV  40 mg Intravenous Q24H   QUEtiapine  50 mg Per Tube BID   revefenacin  175 mcg Nebulization Daily   sodium chloride flush  10-40 mL Intracatheter Q12H   sodium chloride flush  3-10 mL Intravenous Q12H   thiamine  100 mg Per Tube Daily   Or   thiamine  100 mg Intravenous Daily   Infusions:   amiodarone 30 mg/hr (04/20/23 0012)   feeding supplement (OSMOLITE 1.5 CAL) 55 mL/hr at 04/20/23 0012   fentaNYL infusion INTRAVENOUS 50 mcg/hr (04/20/23 0012)   heparin 1,650 Units/hr (04/20/23 0012)   norepinephrine (LEVOPHED) Adult infusion Stopped (04/18/23 1349)   vancomycin Stopped (04/19/23 2236)    Assessment: 27 yoM admitted on 3/17 with PMH of Afib on Xarelto anticoagulation.  Xarelto was initially resumed inpatient, but  held on 3/28 due to acute ischemic stroke.  Pharmacy is consulted on 4/1 to start Heparin for Afib, no bolus, with CVA dosing.    04/20/2023: HL 0.14 subtherapeutic on 1650 units/hr No bleeding noted per RN  Goal of Therapy:  Heparin level 0.3-0.5 units/ml  (low goal d/t recent CVA)  Monitor platelets by anticoagulation protocol: Yes   Plan:  No heparin bolus  Increase heparin IV infusion to 1850 units/hr Heparin level 6 hours after rate increase Daily heparin level and CBC  Arley Phenix RPh 04/20/2023, 2:49 AM

## 2023-04-20 NOTE — Progress Notes (Signed)
 He is still spiking temperatures.  So far, cultures are negative.  He may have a little bit of a sore on his backside.  I wonder if the temperatures may not be from a CNS process secondary to the stroke that he had.  I wonder if the temperatures might be from medications.  He still on the vent.  They try to wean him off and he just is not able to.  I am unsure if is able to understand and follow commands.  His labs show sodium 140.  Potassium 4.0.  BUN 58 creatinine 1.77.  Calcium 7.3.  Phosphorus is 1.7.  His white cell count 3.6.  Hemoglobin 7.6.  Platelet count 310,000.  He continues to have atrial fibrillation.  He is on the amiodarone drip.  We will go ahead with his next week of chemotherapy.  I think we have to treat him.  Have to believe that he is responding.  We will follow-up on his immunoglobulin status after this week.  He is getting fed with tube feeds.  Hopefully, he will be able to have some cognitive function.  Vital signs show temperature 1-1.7.  Pulse 105.  Blood pressure 141/76.  Oral exam does not show any obvious oral lesions.  Lungs sound pretty clear bilaterally.  He has good breath sounds bilaterally.  Cardiac exam is irregular rate and rhythm consistent with the atrial fibrillation.  Abdomen is obese.  Bowel sounds are slightly decreased.  There is no obvious guarding or rebound tenderness.  Extremities shows no clubbing, cyanosis or edema.  Neurological exam is really not focal.   He has IgG kappa myeloma.  We will go ahead with his second week of treatment.  He will get Cytoxan/Velcade.  Blood sugars are on the high side.  I am sure this is from steroids that he has gotten.  I hate that he is having these temperatures.  He is not neutropenic.  His blood counts tolerated the chemo well so far.  I know the staff in the ICU are doing a great job with him.   Christin Bach, MD  Colossians 3:23

## 2023-04-20 NOTE — Progress Notes (Addendum)
 NAME:  MUJTABA BOLLIG, MRN:  [id number], DOB:  1960-12-25, LOS: 17 ADMISSION DATE:  04/03/2023, CONSULTATION DATE:  04/07/23 REFERRING MD:  Jerolyn Center CHIEF COMPLAINT:  AMS   BRIEF  JOURNEY RATTERMAN is a 63 y.o. male who has a PMH as below including but not limited to COPD, OSA on CPAP, chronic diastolic CHF, HTN, HLD, PAF on Xarelto, tobacco dependence, alcohol use.  He is followed by [name] as an outpatient. He presented to [facility] ED on 3/17 with productive greenish cough, fever/chills, nausea, weakness, fatigue, dyspnea x 2 weeks, intermittent confusion, decreased appetite, new/ nontraumatic low back pain, despite recent completion of outpt azithro.    Admitted to [facility] with sepsis and CAP.  Diuresed for ongoing interstitial opacities with small pleural effusions but complicated by worsening AKI.  Cefepime stopped due to AMS.  Hospitalization complicated by afib w/ RVR 3/20, new fevers 3/21 and transferred to ICU w/ ID and nephrology consult, abx escalated to zosyn.  Oncology consulted 3/22 for elevated protein and calcium.  Underwent bone marrow biopsy 3/25 with workup c/w IgG kappa myeloma.  Developed epistaxis after attempted core track placement 3/27 with ongoing AMS, required intubation for airway protection.  Neurology consulted 3/28 for new right PCA territory infarct with petechial hemorrhage noted on MRI despite ongoing xarelto.     Pertinent  Medical History:  has Seizure (HCC); Hyponatremia; Alcohol use disorder, moderate, dependence (HCC); Class 1 obesity; Essential hypertension; Type 2 diabetes mellitus without complication, without long-term current use of insulin (HCC); Chronic obstructive pulmonary disease (HCC); Gastroesophageal reflux disease; Lower extremity edema; Permanent atrial fibrillation (HCC); OSA (obstructive sleep apnea); Chronic heart failure with preserved ejection fraction (HCC); Seizures (HCC); Protein-calorie malnutrition, moderate (HCC); Hypomagnesemia; Alcohol abuse;  Obesity (BMI 30-39.9); Noncompliance with medication regimen; Aortic root dilation (HCC); Acquired thrombophilia (HCC); Pneumonia due to infectious organism; AKI (acute kidney injury) (HCC); Acute metabolic encephalopathy; Acute encephalopathy; Hypokalemia; Acute respiratory failure with hypoxia (HCC); 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); and Sepsis (HCC) on their problem list.  Significant Hospital Events: Including procedures, antibiotic start and stop dates in addition to other pertinent events   3/17 admit 3.19 - BNPP 800 3/20 - HFNC O2 need 3/21 - Altered but protecting airway. Vitals stable.Marland Kitchen 7L [place] O2 need. BN 275 (down from 813) ID Consult: Fever, sepsis of unknown source--potentially due to aspiration in context of encephalopathy. Alcoholic liver disease. 3/AKI. 4Encephalopathy --presumably from alcohol withdrawal, hepatic encaphalopathy though no cirrhosis on CT at [facility] in 2019 3/22 - HEme conslt - cocern for myleoma v Waldenstrom ( .  His total protein is quite high.  His calcium is quite high.  His albumin is low.) Getting seruum viscolity due to Acue encephalpthy. Pan CT   - unremarlkable. ID thinks low odds for infection. ID recommending MRI. RN/Wife report Obutndatioin with restlessness - on prn ativan. Last drink > 2 weeks go . Wife describes 2022 admit for Low NA - and seziure like activity but no EEG or neuro conslt in record.  Started Precedex.  Antibiotics all stopped by ID 3/23 cerebral EEG without any seizures but showing encephalopathy.  MRI brain could not be done because patient desaturated while supine. Per RN "His blood is too thick to obtain a serum protein, parathyroid, viscosity. " Ammonia 65. 3/25 bone marrow biopsy  3/27 bone marrow biopsy positive for myeloma, afternoon development of epi taxis after core track placement attempt this coupled with lethargy prompted intubation for airway protection.  First dose cytoxan/ velcade  3/28 no issues overnight, remains  sedated on ventilator, pending MRI brain 3/31 DNR 4/1 bradycardic/ hypotensive w/precedex requiring brief NE support.  Heparin gtt started 4/2 PSV x 2 hrs prior to increased WOB.  MRA brain neg  Interim History / Subjective:  MRA brain neg yest, US carotid no significant stenosis Persistent fevers despite tylenol, tmax 101.8 Minimal secretions this am Remains frequently agitated, non focal but not f/c Oncology w/ plans to repeat cytoxan/ velcade today   Objective:  Blood pressure (!) 141/76, pulse (!) 105, temperature (!) 101.7 F (38.7 C), resp. rate 16, height 6\' 2"  (1.88 m), weight 113.6 kg, SpO2 99%.    Vent Mode: PRVC FiO2 (%):  [40 %-100 %] 40 % Set Rate:  [18 bmp] 18 bmp Vt Set:  [650 mL] 650 mL PEEP:  [5 cmH20] 5 cmH20 Pressure Support:  [5 cmH20] 5 cmH20 Plateau Pressure:  [10 cmH20-17 cmH20] 16 cmH20   Intake/Output Summary (Last 24 hours) at 04/20/2023 0750 Last data filed at 04/20/2023 0636 Gross per 24 hour  Intake 4817.63 ml  Output 2375 ml  Net 2442.63 ml   Filed Weights   04/18/23 0453 04/19/23 0500 04/20/23 0427  Weight: 111.3 kg 113.6 kg 113.6 kg   Examination: Fent 50 s/p bolus  General:  critically ill older male lying in bed in NAD HEENT: MM pink/moist, poor dentition, ETT/ OGT, pupils 3/r, scleral edema present Neuro: sedated, will w/d to noxious stimuli in all extremities, moves spont, requiring UE wrist restraints  CV: irir,  PULM:  MV supported, clear, no wheeze, scant secretions GI: obese, soft, bs+, FMS- minimal output, foley- cyu Extremities: hot/ mildly diaphoretic, generalized dependent UE> LE Skin: no rashes, posterior not examined   CBG's read lower than labs, but remain > 200  Labs> BUN/ sCr 57/ 1.77 >57/ 1.62, bicarb 19, Cl improving, H/H 8.5/ 29.1> 7.9/ 105  UOP 2.3L /24hrs +2.4L Net +23L  FMS> but no documented output  Labs/imaging personally reviewed:  CT head 3/19 > neg. Renal US 3/19 > neg. CT chest/abd/pelv 3/21 >  infrarenal AAA Cerebral EEG 04/08/2023: No seizures. CT chest without con 3/23 collapse consolidation left lower lobe , scattered lytic lesions; ascending aortic aneurysm MRI 3/28 with Acute right PCA territory infarcts in the right occipital lobe with petechial hemorrhage and Additional acute infarct in the right parietal lobe and small acute infarct in the right frontal lobe  Assessment & Plan:   Acute metabolic encephalopathy  - Multifactorial 2/2 hyperammonemia, EtOH, new R sided strokes, ?agitated delirium - CT head neg, TSH, ABG normal. - EEG 3/22 & 3/25 No seizures but shows encephalopathy. - TTE w/ EF 60-65% HX OF ETOH Acute Right PCA territory infarcts: Presumed embolic with afib given multiple vascular territories despite Xarelto, per MRI 3/28 P: - cont to have intermittent agitation, not f/c, no obvious focal deficits despite seroquel and ritalin  - neuro following; appreciate recs - MRA brain neg and US carotid doppler no significant stenosis - possibly transition heparin gtt to eliquis pending neurology input today - cont to limit sedating meds/ delirium precautions  - statin, ASA - thiamine, folic acid, mvi - monitor QTC - intermittent check ammonia- remains normal.  Lactulose increased for bowel regimen today  -neuroprotective measures- normothermia, euglycemia, HOB greater than 30, head in neutral alignment, normocapnia, normoxia.  -PT/OT/SLP when appropriate   Acute hypoxic respiratory failure  - Secondary to above + baseline COPD (though not on O2), OSA.  - Baseline 2 L nasal cannula  at night - Intubated afternoon 3/27 after development of epistaxis for airway protection Hx COPD, OSA, tobacco dependence Staph lugdunensis pna P: - tolerated PSV x 2 hours yesterday prior to going back on full support for WOB.  Secretions much better today, minimal vent settings - cont daily SBT/ WUA - full MV support, LTVV - VAP/ PPI  - following repeat 3/31 trach asp>stap>>  -  day 4/x vanc - PAD protocol> fentanyl gtt, prn versed.  Did not tolerate precedex 4/1. Adding enteral oxy to help with frequent fentanyl boluses - triple therapy nebs - cont diurese today    Newly diagnosed IgG kappa myeloma -Bone marrow biopsy positive for myeloma with IgG kappa myeloma seen - s/p cytoxan/ velcade, first dose 3/27 P: - Oncology following, appreciate assistance, next treatment scheduled for today - Chemotherapy precautions - HZV ppx started PO 3/29  Fevers - suspect related to oncologic process, ?meds, vs less likely neurologic process  - cooling blanket as not very responsive to tylenol  - WBC down today.  ETT secretions much improved, minimal MV requirements.   - doubt VTE as has been on xarelto/ heparin gtt.  Consider repeat UA/ expand abx to cover GN, will discuss with attending - following repeat sputum cx, BC remains ngtd - cont vanc as above for PNA  Macrocytic anemia - H/H remains 7-8's.  Repeat T&S to keep current - trend CBC w/ diff closely on chemo   AKI on CKD3a Hypokalemia, resolved Hypercalcemia (resolved) P: - sCr stable, not much as response from albumin/ lasix yest.  Remains largely hypervolemic on exam.  Check CVP and repeat lasix x2 - add enteral bicarb - trend renal indices  - strict I/Os, daily wts - avoid nephrotoxins, renal dose meds, hemodynamic support as above   Hypoalbuminemia Protein calorie malnutrition Hypophos P: - replete/ monitor electrolytes aggressively - EN per RD  Hx dCHF A.fib on Xarelto Essential HTN HLD -Echo 04/08/2023: Normal left ventricle ejection fraction but right ventricular dysfunction present and is new compared to July 2024 likely related to positive pressure ventilation and L sided disease and chronic hypoxemia P: - Tele monitoring, rate remains controlled.  Will d/c amio and start back lower dose metoprolol given BP stable and increase as hemodynamically tolerated  - remains net +23L.  Wts higher.   CVP 11-12, trend.  Cont to diurese today - heparin gtt per pharmacy > possibly transition to eliquis as above - statin and asa - optimize electrolytes - daily weights; strict I/O's   T2DM Plan: - CBG q4 - CBG lower than serum but remain > 200 - increase semglee and TF coverage - SSI resistant prn  Ascending and infrarenal aortic aneurysm - incidentally noted on imaging - f/u annually output per recs   DPTI- right back, right buttock - WOC consult   GOC- remains DNR.  [place] GOC 3/31 updated wife at bedside. States she does not think patient would want long term ventilation/tracheostomy. She spoke with family and decided to make patient DNR. 4/1 wife updated; she is okay with giving time limited trial on vent. If no better by this weekend would not want to pursue trach and agrees with one-way extubation. - pending family update 4/4, no family at bedside  Best practice (evaluated daily):  Diet/type: tubefeeds  DVT prophylaxis: SCD; heparin gtt Pressure ulcer(s): pressure ulcer assessment deferred  GI prophylaxis: Pepcid Lines: N/A PICC RUE Foley:  Yes, and it is still needed Code Status:  DNR Last date of multidisciplinary goals  of care discussion: ongoing   CRITICAL CARE Performed by: Posey Boyer   Total critical care time: 38 minutes     Posey Boyer, MSN, AG-ACNP-BC [facility] 04/20/2023, 7:50 AM  See Amion for pager If no response to pager , please call [phone] until 7pm After 7:00 pm call Elink  [phone]

## 2023-04-20 NOTE — Plan of Care (Signed)
  Problem: Health Behavior/Discharge Planning: Goal: Ability to manage health-related needs will improve Outcome: Not Progressing   Problem: Clinical Measurements: Goal: Ability to maintain clinical measurements within normal limits will improve Outcome: Not Progressing   

## 2023-04-20 NOTE — Progress Notes (Signed)
 Provided emotional and spiritual support as Ronnie Dunn was extubated.  He is surrounded by love from his wife and parents.  Chaplains remain available throughout the night.  Please page as needs arise.

## 2023-04-21 DIAGNOSIS — G9341 Metabolic encephalopathy: Secondary | ICD-10-CM | POA: Diagnosis not present

## 2023-04-21 DIAGNOSIS — J9601 Acute respiratory failure with hypoxia: Secondary | ICD-10-CM | POA: Diagnosis not present

## 2023-04-21 DIAGNOSIS — G9340 Encephalopathy, unspecified: Secondary | ICD-10-CM | POA: Diagnosis not present

## 2023-04-21 DIAGNOSIS — J449 Chronic obstructive pulmonary disease, unspecified: Secondary | ICD-10-CM | POA: Diagnosis not present

## 2023-04-21 DIAGNOSIS — G4733 Obstructive sleep apnea (adult) (pediatric): Secondary | ICD-10-CM | POA: Diagnosis not present

## 2023-04-21 LAB — RENAL FUNCTION PANEL
Albumin: 1.8 g/dL — ABNORMAL LOW (ref 3.5–5.0)
Anion gap: 9 (ref 5–15)
BUN: 54 mg/dL — ABNORMAL HIGH (ref 8–23)
CO2: 21 mmol/L — ABNORMAL LOW (ref 22–32)
Calcium: 8 mg/dL — ABNORMAL LOW (ref 8.9–10.3)
Chloride: 114 mmol/L — ABNORMAL HIGH (ref 98–111)
Creatinine, Ser: 1.63 mg/dL — ABNORMAL HIGH (ref 0.61–1.24)
GFR, Estimated: 47 mL/min — ABNORMAL LOW (ref >60)
Glucose, Bld: 162 mg/dL — ABNORMAL HIGH (ref 70–99)
Phosphorus: 3.4 mg/dL (ref 2.5–4.6)
Potassium: 4.2 mmol/L (ref 3.5–5.1)
Sodium: 144 mmol/L (ref 135–145)

## 2023-04-21 LAB — GLUCOSE, CAPILLARY
Glucose-Capillary: 102 mg/dL — ABNORMAL HIGH (ref 70–99)
Glucose-Capillary: 144 mg/dL — ABNORMAL HIGH (ref 70–99)
Glucose-Capillary: 144 mg/dL — ABNORMAL HIGH (ref 70–99)

## 2023-04-21 LAB — CBC WITH DIFFERENTIAL/PLATELET
Abs Immature Granulocytes: 0.02 10*3/uL (ref 0.00–0.07)
Basophils Absolute: 0 10*3/uL (ref 0.0–0.1)
Basophils Relative: 1 %
Eosinophils Absolute: 0.2 10*3/uL (ref 0.0–0.5)
Eosinophils Relative: 5 %
HCT: 28.5 % — ABNORMAL LOW (ref 39.0–52.0)
Hemoglobin: 8.7 g/dL — ABNORMAL LOW (ref 13.0–17.0)
Immature Granulocytes: 1 %
Lymphocytes Relative: 14 %
Lymphs Abs: 0.6 10*3/uL — ABNORMAL LOW (ref 0.7–4.0)
MCH: 30.9 pg (ref 26.0–34.0)
MCHC: 30.5 g/dL (ref 30.0–36.0)
MCV: 101.1 fL — ABNORMAL HIGH (ref 80.0–100.0)
Monocytes Absolute: 0.3 10*3/uL (ref 0.1–1.0)
Monocytes Relative: 6 %
Neutro Abs: 2.8 10*3/uL (ref 1.7–7.7)
Neutrophils Relative %: 73 %
Platelets: 381 10*3/uL (ref 150–400)
RBC: 2.82 MIL/uL — ABNORMAL LOW (ref 4.22–5.81)
RDW: 18.6 % — ABNORMAL HIGH (ref 11.5–15.5)
WBC: 3.9 10*3/uL — ABNORMAL LOW (ref 4.0–10.5)
nRBC: 0 % (ref 0.0–0.2)

## 2023-04-21 LAB — MAGNESIUM: Magnesium: 1.4 mg/dL — ABNORMAL LOW (ref 1.7–2.4)

## 2023-04-21 MED ORDER — GLYCOPYRROLATE 0.2 MG/ML IJ SOLN: 0.2000 mg | INTRAMUSCULAR | Status: DC | PRN

## 2023-04-21 MED ORDER — DIPHENHYDRAMINE HCL 50 MG/ML IJ SOLN: 25.0000 mg | INTRAMUSCULAR | Status: DC | PRN

## 2023-04-21 MED ORDER — SODIUM CHLORIDE 0.9% FLUSH
3.0000 mL | Freq: Two times a day (BID) | INTRAVENOUS | Status: DC
  Administered 2023-04-21 – 2023-04-23 (×6): 10 mL via INTRAVENOUS
  Administered 2023-04-24: 3 mL via INTRAVENOUS

## 2023-04-21 MED ORDER — HYDROMORPHONE BOLUS VIA INFUSION
1.0000 mg | INTRAVENOUS | Status: DC | PRN
  Administered 2023-04-21 – 2023-04-24 (×3): 1 mg via INTRAVENOUS

## 2023-04-21 MED ORDER — HYDROMORPHONE HCL-NACL 50-0.9 MG/50ML-% IV SOLN
0.0000 mg/h | INTRAVENOUS | Status: DC
  Administered 2023-04-21: 2 mg/h via INTRAVENOUS
  Administered 2023-04-21: 1 mg/h via INTRAVENOUS
  Administered 2023-04-22 – 2023-04-23 (×3): 2 mg/h via INTRAVENOUS
  Administered 2023-04-24: 4 mg/h via INTRAVENOUS
  Filled 2023-04-21 (×5): qty 50

## 2023-04-21 MED ORDER — GLYCOPYRROLATE 0.2 MG/ML IJ SOLN
0.2000 mg | INTRAMUSCULAR | Status: DC | PRN
  Administered 2023-04-23 – 2023-04-24 (×3): 0.2 mg via INTRAVENOUS
  Filled 2023-04-21 (×4): qty 1

## 2023-04-21 MED ORDER — HYDROMORPHONE HCL 1 MG/ML IJ SOLN
1.0000 mg | INTRAMUSCULAR | Status: DC | PRN
  Administered 2023-04-21: 1 mg via INTRAVENOUS
  Filled 2023-04-21: qty 1

## 2023-04-21 MED ORDER — GLYCOPYRROLATE 1 MG PO TABS: 1.0000 mg | ORAL_TABLET | ORAL | Status: DC | PRN

## 2023-04-21 MED ORDER — HYDROMORPHONE HCL 1 MG/ML IJ SOLN
1.0000 mg | Freq: Once | INTRAMUSCULAR | Status: AC
  Administered 2023-04-21: 1 mg via INTRAVENOUS
  Filled 2023-04-21: qty 1

## 2023-04-21 MED ORDER — SODIUM CHLORIDE 0.9% FLUSH: 3.0000 mL | INTRAVENOUS | Status: DC | PRN

## 2023-04-21 MED ORDER — LABETALOL HCL 5 MG/ML IV SOLN
10.0000 mg | INTRAVENOUS | Status: DC | PRN
Start: 2023-04-21 — End: 2023-04-21
  Administered 2023-04-21: 10 mg via INTRAVENOUS
  Filled 2023-04-21: qty 4

## 2023-04-21 MED ORDER — LORAZEPAM 2 MG/ML IJ SOLN
2.0000 mg | INTRAMUSCULAR | Status: DC | PRN
  Administered 2023-04-21 – 2023-04-22 (×2): 2 mg via INTRAVENOUS
  Administered 2023-04-24: 4 mg via INTRAVENOUS
  Filled 2023-04-21 (×2): qty 1
  Filled 2023-04-21: qty 2
  Filled 2023-04-21: qty 1

## 2023-04-21 MED ORDER — POLYVINYL ALCOHOL 1.4 % OP SOLN: 1.0000 [drp] | Freq: Four times a day (QID) | OPHTHALMIC | Status: DC | PRN

## 2023-04-21 NOTE — Plan of Care (Signed)
 Noted that pt is transitioning to comfort care measures. Neurology will sign off for now and please call with questions.   Marvel Plan, MD PhD Stroke Neurology 04/21/2023 1:25 PM

## 2023-04-21 NOTE — Progress Notes (Signed)
 NAME:  Ronnie Dunn, MRN:  [id number], DOB:  April 05, 1960, LOS: 18 ADMISSION DATE:  04/03/2023, CONSULTATION DATE:  04/07/23 REFERRING MD:  Jerolyn Center CHIEF COMPLAINT:  AMS   BRIEF  Ronnie Dunn is a 63 y.o. male who has a PMH as below including but not limited to COPD, OSA on CPAP, chronic diastolic CHF, HTN, HLD, PAF on Xarelto, tobacco dependence, alcohol use.  He is followed by [name] as an outpatient. He presented to [facility] ED on 3/17 with productive greenish cough, fever/chills, nausea, weakness, fatigue, dyspnea x 2 weeks, intermittent confusion, decreased appetite, new/ nontraumatic low back pain, despite recent completion of outpt azithro.    Admitted to [facility] with sepsis and CAP.  Diuresed for ongoing interstitial opacities with small pleural effusions but complicated by worsening AKI.  Cefepime stopped due to AMS.  Hospitalization complicated by afib w/ RVR 3/20, new fevers 3/21 and transferred to ICU w/ ID and nephrology consult, abx escalated to zosyn.  Oncology consulted 3/22 for elevated protein and calcium.  Underwent bone marrow biopsy 3/25 with workup c/w IgG kappa myeloma.  Developed epistaxis after attempted core track placement 3/27 with ongoing AMS, required intubation for airway protection.  Neurology consulted 3/28 for new right PCA territory infarct with petechial hemorrhage noted on MRI despite ongoing xarelto.     Pertinent  Medical History:  has Seizure (HCC); Hyponatremia; Alcohol use disorder, moderate, dependence (HCC); Class 1 obesity; Essential hypertension; Type 2 diabetes mellitus without complication, without long-term current use of insulin (HCC); Chronic obstructive pulmonary disease (HCC); Gastroesophageal reflux disease; Lower extremity edema; Permanent atrial fibrillation (HCC); OSA (obstructive sleep apnea); Chronic heart failure with preserved ejection fraction (HCC); Seizures (HCC); Protein-calorie malnutrition, moderate (HCC); Hypomagnesemia; Alcohol abuse;  Obesity (BMI 30-39.9); Noncompliance with medication regimen; Aortic root dilation (HCC); Acquired thrombophilia (HCC); Pneumonia due to infectious organism; AKI (acute kidney injury) (HCC); Acute metabolic encephalopathy; Acute encephalopathy; Hypokalemia; Acute respiratory failure with hypoxia (HCC); 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); and Sepsis (HCC) on their problem list.  Significant Hospital Events: Including procedures, antibiotic start and stop dates in addition to other pertinent events   3/17 admit 3.19 - BNPP 800 3/20 - HFNC O2 need 3/21 - Altered but protecting airway. Vitals stable.Marland Kitchen 7L [place] O2 need. BN 275 (down from 813) ID Consult: Fever, sepsis of unknown source--potentially due to aspiration in context of encephalopathy. Alcoholic liver disease. 3/AKI. 4Encephalopathy --presumably from alcohol withdrawal, hepatic encaphalopathy though no cirrhosis on CT at [facility] in 2019 3/22 - HEme conslt - cocern for myleoma v Waldenstrom ( .  His total protein is quite high.  His calcium is quite high.  His albumin is low.) Getting seruum viscolity due to Acue encephalpthy. Pan CT   - unremarlkable. ID thinks low odds for infection. ID recommending MRI. RN/Wife report Obutndatioin with restlessness - on prn ativan. Last drink > 2 weeks go . Wife describes 2022 admit for Low NA - and seziure like activity but no EEG or neuro conslt in record.  Started Precedex.  Antibiotics all stopped by ID 3/23 cerebral EEG without any seizures but showing encephalopathy.  MRI brain could not be done because patient desaturated while supine. Per RN "His blood is too thick to obtain a serum protein, parathyroid, viscosity. " Ammonia 65. 3/25 bone marrow biopsy  3/27 bone marrow biopsy positive for myeloma, afternoon development of epi taxis after core track placement attempt this coupled with lethargy prompted intubation for airway protection.  First dose cytoxan/ velcade  3/28 no issues overnight, remains  sedated on ventilator, pending MRI brain 3/31 DNR 4/1 bradycardic/ hypotensive w/precedex requiring brief NE support.  Heparin gtt started 4/2 PSV x 2 hrs prior to increased WOB.  MRA brain neg 4/3 one way extubated  Interim History / Subjective:  S/p one way extubation 4/3, has remained agitated and restless overnight requiring prn's.  Not purposeful or f/c for wife.  Wife believes he is in much pain and suffering.  States he would not want life prolonging measures including replacing cortak, nor seek treatment for his myeloma.  Transitioned to [place] HFNC but has increased upper airway secretions overnight.   Objective:  Blood pressure (!) 159/74, pulse 98, temperature (!) 100.8 F (38.2 C), temperature source Bladder, resp. rate 15, height 6\' 2"  (1.88 m), weight 113.6 kg, SpO2 97%. CVP:  [12 mmHg] 12 mmHg  Vent Mode: PRVC FiO2 (%):  [40 %-45 %] 45 % Set Rate:  [18 bmp] 18 bmp Vt Set:  [650 mL] 650 mL PEEP:  [5 cmH20] 5 cmH20   Intake/Output Summary (Last 24 hours) at 04/21/2023 0921 Last data filed at 04/21/2023 0500 Gross per 24 hour  Intake 1996.14 ml  Output 5775 ml  Net -3778.86 ml   Filed Weights   04/18/23 0453 04/19/23 0500 04/20/23 0427  Weight: 111.3 kg 113.6 kg 113.6 kg   Examination: General:  AoC ill appearing elderly male lying in bed, intermittently moans and restless HEENT: MM pink/dry, minimal audible upper airway congestion, grimaces at times Neuro: will open eyes but not f/c, ?purposeful movement in RUE but otherwise moans/ grimaces with movement of his extremities CV: irir, afib rate ~100 PULM:  non labored, mildly tachypnic at times, coarse, scattered rhonchi on right GI: soft, b+, foley, FMS Extremities: warm/dry, generalized edema, dusky fingertips/ toes Skin: no rashes   I/O's, labs reviewed.  Tmax 100.8 Repeat trach asp> MRSA   Labs/imaging personally reviewed:  CT head 3/19 > neg. Renal US 3/19 > neg. CT chest/abd/pelv 3/21 > infrarenal  AAA Cerebral EEG 04/08/2023: No seizures. CT chest without con 3/23 collapse consolidation left lower lobe , scattered lytic lesions; ascending aortic aneurysm MRI 3/28 with Acute right PCA territory infarcts in the right occipital lobe with petechial hemorrhage and Additional acute infarct in the right parietal lobe and small acute infarct in the right frontal lobe  Assessment & Plan:   Acute metabolic and hepatic encephalopathy/ agitated delirium HX OF ETOH Acute Right PCA territory infarcts Acute hypoxic respiratory failure  Staph lugdenesis and MRSA PNA Hx COPD, OSA, tobacco dependence Newly diagnosed IgG kappa myeloma Fevers Macrocytic anemia AKI on CKD3a Hypoalbuminemia Protein calorie malnutrition Hx dCHF A.fib on Xarelto Essential HTN HLD T2DM with hyperglycemia DPTI DNR/ DNI P:  - s/p one way extubation 4/4 based on pt's known wishes and no significant improvement despite maximum medical therapies, day 17 of hospitalization.  Wife reiterates he would not want to continue as things are currently, does not want to replace feeding tube/ things that would be necessary to continue medical support.  She believes he suffering in great pain, which I would agree based on assessment.  Wife declined his 2nd chemo tx 4/3.  Do believe his airway secretions would/ will become an issue for him with his ongoing poor mental status at some point.  After extensive discussion, will transition to full comfort measures and when comfortable, transfer to palliative floor.  Orders placed> dilaudid gtt, prn ativan, robinul and other comfort measures.  Ongoing support provided.  Best practice (evaluated daily):  Diet/type: NPO  DVT prophylaxis: not indicated Pressure ulcer(s): pressure ulcer assessment deferred  GI prophylaxis: n/a Lines: N/A PICC RUE Foley:  Yes, and it is still needed> comfort Code Status:  DNR/ DNI Last date of multidisciplinary goals of care discussion: 4/4   CRITICAL  CARE Performed by: Posey Boyer   Total critical care time: 55 minutes     Posey Boyer, MSN, AG-ACNP-BC  Pulmonary & Critical Care 04/21/2023, 9:21 AM  See Amion for pager If no response to pager , please call [phone] until 7pm After 7:00 pm call Elink  [phone]

## 2023-04-21 NOTE — Progress Notes (Signed)
 Provided emotional and spiritual support to [name] and to [name]'s parents over 2 visits with them today. Affirmed the loving care they are giving Ronnie Dunn and encouraged them to not neglect themselves in the process.

## 2023-04-21 NOTE — Progress Notes (Signed)
 I had a nice long talk with his wife this morning.  The events of yesterday are noted.  It is now going to be comfort care.  I really cannot argue against this.  I know that he has the myeloma which we can treat.  However, even treating the myeloma successfully may not improve his quality of life.  She would not want him and he would not want to be in a bed and just exist.  She understands that he will be made comfortable.  She would really like this.  She is very grateful for the care that he has received from everybody in the ICU.  We did not give any chemotherapy yesterday.  I know that just the 1 dose probably has helped with the myeloma levels.  He is now extubated.  He still does not have much response.  He still has the atrial fibrillation.  He is off tube feeds.  His wife has a strong faith.  She knows that he will be going to [facility].  She is wants to make sure that he gets there comfortably with dignity and respect.  I know this will happen given the wonderful compassion that the ICU staff have.  I suspect that at some point, he may get put on a morphine infusion.  His labs look pretty stable.  BUN is 54 creatinine 1.63.  Calcium is 8 with an albumin of 1.4.  His white count 3.9.  Hemoglobin 8.7.  Platelet count 381,000.  Again, he is now comfort care.  He will not be intubated in case of a cardiopulmonary event.  From now, we will then sign off.  He has been a pleasure to try to help Mr. Landino.  Again, he seems like he would be a nice guy to talk to.  I will certainly pray hard for he and his family.  Christin Bach, MD  2 Timothy 4:16-18

## 2023-04-21 NOTE — Progress Notes (Signed)
 2100 This RN reached out about changing tube medications as tube was taken out on previous shift. As a result elink doc dc'd fent gtt and added PRN pushes, see MAR. Pt resting comfortably at this time.  0030 PRN given, not managing pain, wife unhappy with medication regimen, PRN dose increased.

## 2023-04-21 NOTE — Plan of Care (Signed)
  Problem: Education: Goal: Knowledge of General Education information will improve Description: Including pain rating scale, medication(s)/side effects and non-pharmacologic comfort measures Outcome: Progressing   Problem: Health Behavior/Discharge Planning: Goal: Ability to manage health-related needs will improve Outcome: Progressing   Problem: Clinical Measurements: Goal: Ability to maintain clinical measurements within normal limits will improve Outcome: Progressing Goal: Will remain free from infection Outcome: Progressing Goal: Diagnostic test results will improve Outcome: Progressing Goal: Respiratory complications will improve Outcome: Progressing Goal: Cardiovascular complication will be avoided Outcome: Progressing   Problem: Activity: Goal: Risk for activity intolerance will decrease Outcome: Progressing   Problem: Nutrition: Goal: Adequate nutrition will be maintained Outcome: Progressing   Problem: Coping: Goal: Level of anxiety will decrease Outcome: Progressing   Problem: Elimination: Goal: Will not experience complications related to bowel motility Outcome: Progressing Goal: Will not experience complications related to urinary retention Outcome: Progressing   Problem: Pain Managment: Goal: General experience of comfort will improve and/or be controlled Outcome: Progressing   Problem: Safety: Goal: Ability to remain free from injury will improve Outcome: Progressing   Problem: Skin Integrity: Goal: Risk for impaired skin integrity will decrease Outcome: Progressing   Problem: Education: Goal: Ability to demonstrate management of disease process will improve Outcome: Progressing Goal: Ability to verbalize understanding of medication therapies will improve Outcome: Progressing Goal: Individualized Educational Video(s) Outcome: Progressing   Problem: Activity: Goal: Capacity to carry out activities will improve Outcome: Progressing    Problem: Cardiac: Goal: Ability to achieve and maintain adequate cardiopulmonary perfusion will improve Outcome: Progressing   Problem: Education: Goal: Ability to describe self-care measures that may prevent or decrease complications (Diabetes Survival Skills Education) will improve Outcome: Progressing Goal: Individualized Educational Video(s) Outcome: Progressing   Problem: Coping: Goal: Ability to adjust to condition or change in health will improve Outcome: Progressing   Problem: Fluid Volume: Goal: Ability to maintain a balanced intake and output will improve Outcome: Progressing   Problem: Health Behavior/Discharge Planning: Goal: Ability to identify and utilize available resources and services will improve Outcome: Progressing Goal: Ability to manage health-related needs will improve Outcome: Progressing   Problem: Metabolic: Goal: Ability to maintain appropriate glucose levels will improve Outcome: Progressing   Problem: Nutritional: Goal: Maintenance of adequate nutrition will improve Outcome: Progressing Goal: Progress toward achieving an optimal weight will improve Outcome: Progressing   Problem: Skin Integrity: Goal: Risk for impaired skin integrity will decrease Outcome: Progressing   Problem: Tissue Perfusion: Goal: Adequacy of tissue perfusion will improve Outcome: Progressing   Problem: Activity: Goal: Ability to tolerate increased activity will improve Outcome: Progressing   Problem: Respiratory: Goal: Ability to maintain a clear airway and adequate ventilation will improve Outcome: Progressing   Problem: Role Relationship: Goal: Method of communication will improve Outcome: Progressing   Problem: Safety: Goal: Non-violent Restraint(s) Outcome: Progressing

## 2023-04-21 NOTE — Progress Notes (Signed)
 Nutrition Brief Note  Chart reviewed. Pt now transitioning to comfort care.  No further nutrition interventions planned at this time.  Please re-consult as needed.   Shelle Iron RD, LDN Contact via [employer].

## 2023-04-21 NOTE — TOC Progression Note (Signed)
 Transition of Care [facility]) - Progression Note    Patient Details  Name: Ronnie Dunn MRN: [id number] Date of Birth: January 17, 1961  [facility]) CM/SW Contact  Adrian Prows, RN Phone Number: 04/21/2023, 11:09 AM  Clinical Narrative:    Pt extubated and transitioned to comfort care; [facility] is following,   Expected Discharge Plan: Home w Home Health Services Barriers to Discharge: Continued Medical Work up  Expected Discharge Plan and Services   Discharge Planning Services: CM Consult Post Acute Care Choice: Home Health Living arrangements for the past 2 months: Single Family Home                           HH Arranged: PT [facility] Agency: [facility] Date [facility] Agency Contacted: 04/06/23 Time HH Agency Contacted: 1100 Representative spoke with at [facility] Agency: Amy   Social Determinants of Health (SDOH) Interventions SDOH Screenings   Food Insecurity: No Food Insecurity (04/03/2023)  Housing: Low Risk  (04/03/2023)  Transportation Needs: No Transportation Needs (04/03/2023)  Utilities: Not At Risk (04/03/2023)  Depression (PHQ2-9): Low Risk  (08/04/2021)  Recent Concern: Depression (PHQ2-9) - Medium Risk (05/28/2021)  Social Connections: Moderately Isolated (04/03/2023)  Tobacco Use: High Risk (04/03/2023)    Readmission Risk Interventions    04/06/2023   11:05 AM 06/23/2021   11:48 AM  Readmission Risk Prevention Plan  Transportation Screening Complete Complete  PCP or Specialist Appt within 5-7 Days  Complete  PCP or Specialist Appt within 3-5 Days Complete   Home Care Screening  Complete  Medication Review (RN CM)  Complete  HRI or Home Care Consult Complete   Social Work Consult for Recovery Care Planning/Counseling Complete   Palliative Care Screening Complete   Medication Review Oceanographer) Complete

## 2023-04-22 DIAGNOSIS — Z7189 Other specified counseling: Secondary | ICD-10-CM | POA: Diagnosis not present

## 2023-04-22 DIAGNOSIS — E43 Unspecified severe protein-calorie malnutrition: Secondary | ICD-10-CM

## 2023-04-22 DIAGNOSIS — J9601 Acute respiratory failure with hypoxia: Secondary | ICD-10-CM | POA: Diagnosis not present

## 2023-04-22 DIAGNOSIS — G9341 Metabolic encephalopathy: Secondary | ICD-10-CM | POA: Diagnosis not present

## 2023-04-22 DIAGNOSIS — J1520 Pneumonia due to staphylococcus, unspecified: Secondary | ICD-10-CM | POA: Diagnosis present

## 2023-04-22 DIAGNOSIS — E1122 Type 2 diabetes mellitus with diabetic chronic kidney disease: Secondary | ICD-10-CM

## 2023-04-22 DIAGNOSIS — N179 Acute kidney failure, unspecified: Secondary | ICD-10-CM | POA: Diagnosis not present

## 2023-04-22 DIAGNOSIS — Z794 Long term (current) use of insulin: Secondary | ICD-10-CM

## 2023-04-22 LAB — CULTURE, BLOOD (ROUTINE X 2)
Culture: NO GROWTH
Culture: NO GROWTH

## 2023-04-22 MED ORDER — ACETAMINOPHEN 650 MG RE SUPP
650.0000 mg | RECTAL | Status: DC | PRN
  Filled 2023-04-22: qty 1

## 2023-04-22 MED ORDER — ACETAMINOPHEN 10 MG/ML IV SOLN
1000.0000 mg | Freq: Four times a day (QID) | INTRAVENOUS | Status: DC | PRN
  Administered 2023-04-22: 1000 mg via INTRAVENOUS
  Filled 2023-04-22 (×2): qty 100

## 2023-04-22 NOTE — Assessment & Plan Note (Signed)
 Extremely poor prognosis with frequent complications during this hospitalization repeated goals of care discussions with family members including parents and spouse Discussions between these parties and Dr. Wynona Neat including goals of care discussion on 4/3 resulted in desire for patient to undergo one-way extubation on 4/4 and initiation of comfort measures

## 2023-04-22 NOTE — Progress Notes (Signed)
 PROGRESS NOTE   Ronnie Dunn  UJW:[id number] DOB: 16-May-1960 DOA: 04/03/2023 PCP: [name], MD   Date of Service: the patient was seen and examined on 04/22/2023  Brief Narrative:  63 y.o. male with medical history significant for COPD, OSA, permanent atrial fibrillation on Xarelto who originally presented to [facility] emergency department on 3/17 with increasing generalized weakness, confusion and cough.    Upon valuation the emergency department patient was felt to be suffering from sepsis secondary to community-acquired pneumonia.  Patient progressively worsened, complicated by acute kidney injury and worsening encephalopathy thought to possibly be exacerbated by cefepime.  Hospitalization was also complicated by atrial fibrillation with rapid ventricular response.  Patient was transferred to the ICU under the PCCM service on 3/21.    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  With the assistance of Dr. Myna Hidalgo with oncology patient did receive a round of chemotherapy.    Further complications that developed included patient developing epistaxis after attempted core track placement on 3/27 and required intubation for airway protection at that time.  Then, patient developed new right PCA territory infarct with concurrent petechial hemorrhage on 3/28.  For which Dr. Roda Shutters with neurology was consulted.  Due to patient's progressively worsening clinical condition and repeated discussions about goals of care with the wife in an effort to reduce suffering patient was transitioned to comfort measures on 4/4 with terminal extubation.  Patient has now been transferred back to the hospital service on 4/5 with continued comfort measures.   Patient was placed on a Dilaudid infusion with as needed Ativan and Robinul for secretions.   Assessment & Plan Acute metabolic  encephalopathy Progressively worsening obtundation at this point with clinical decline Comfort measures, supportive care Acute respiratory failure with hypoxia (HCC) Multifactorial secondary to multifocal pneumonia, volume overload and COPD Status post extubation 4/4 small amount of supplemental oxygen for comfort Staphylococcal pneumonia (HCC) Managing symptomatically with antipyretics, supportive care Sepsis (HCC) Supportive care, see notations above. 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) New diagnosis  Prognosis extremely poor  received 1 round of chemotherapy earlier in the hospitalization Acute renal failure superimposed on stage 3a chronic kidney disease (HCC) Supportive care at this point due to comfort measures Dramatic reduction in urine output today No longer checking labs Protein-calorie malnutrition, severe (HCC) Comfort feeds only now Type 2 diabetes mellitus with stage 3a chronic kidney disease, with long-term current use of insulin (HCC) Supportive care Accu-Cheks discontinued Goals of care, counseling/discussion Extremely poor prognosis with frequent complications during this hospitalization repeated goals of care discussions with family members including parents and spouse Discussions between these parties and Dr. Wynona Neat including goals of care discussion on 4/3 resulted in desire for patient to undergo one-way extubation on 4/4 and initiation of comfort measures     Subjective:  Patient unable to answer questions due to obtundation.  Physical Exam:  Vitals:   04/21/23 0900 04/21/23 1000 04/21/23 1100 04/21/23 1739  BP: (!) 147/62 (!) 138/59 (!) 147/68 127/67  Pulse: (!) 110 92 (!) 105 100  Resp: 15 (!) 21 16 18   Temp: 99.1 F (37.3 C) 99.3 F (37.4 C)  99.2 F (37.3 C)  TempSrc:  Bladder  Oral  SpO2: 93% 94% 95% (!) 85%  Weight:      Height:        Constitutional: Patient is obtunded and not appearing in any  distress. Skin: no rashes, no  lesions, poor skin turgor noted. ENMT: Dry mucous membranes noted.  Posterior pharynx clear of any exudate or lesions.   Respiratory: Rales and rhonchi bilaterally, no wheezing, diminished breath sounds throughout, no accessory muscle use.  Cardiovascular: Regular rate and rhythm, no murmurs / rubs / gallops. No extremity edema. 2+ pedal pulses. No carotid bruits.  Abdomen: Abdomen is soft and nontender.  No evidence of intra-abdominal masses.  Positive bowel sounds noted in all quadrants.   Musculoskeletal: No joint deformity upper and lower extremities. Good ROM, no contractures.    Data Reviewed:  I have personally reviewed and interpreted labs, imaging.  Significant findings are   CBC: Recent Labs  Lab 04/16/23 0525 04/17/23 1622 04/18/23 0357 04/19/23 0509 04/20/23 0514 04/21/23 0431  WBC 5.2 5.9 5.2 4.2 3.6* 3.9*  NEUTROABS 3.8  --   --   --   --  2.8  HGB 8.7* 8.5* 8.5* 7.9* 7.6* 8.7*  HCT 29.9* 28.9* 29.1* 27.1* 26.4* 28.5*  MCV 106.4* 103.2* 103.9* 105.0* 106.0* 101.1*  PLT 232 254 258 271 310 381   Basic Metabolic Panel: Recent Labs  Lab 04/16/23 0525 04/17/23 0318 04/18/23 0357 04/19/23 0509 04/20/23 0514 04/21/23 0431  NA 144 144 143 141 140 144  K 4.1 4.3 4.2 3.8 4.0 4.2  CL 118* 118* 117* 115* 114* 114*  CO2 21* 21* 20* 19* 16* 21*  GLUCOSE 272* 296* 341* 354* 390* 162*  BUN 69* 64* 57* 57* 58* 54*  CREATININE 1.88* 1.66* 1.77* 1.62* 1.77* 1.63*  CALCIUM 7.0* 6.8* 6.8* 6.9* 7.3* 8.0*  MG 2.0 1.8  --  1.8 1.7 1.4*  PHOS 1.8* 2.3* 2.6 2.5 1.7* 3.4   GFR: Estimated Creatinine Clearance: 62.2 mL/min (A) (by C-G formula based on SCr of 1.63 mg/dL (H)). Liver Function Tests: Recent Labs  Lab 04/16/23 0525 04/21/23 0431  AST 20  --   ALT 9  --   ALKPHOS 111  --   BILITOT 0.4  --   PROT 8.5*  --   ALBUMIN 1.5* 1.8*     Code Status:  DNR.  Code status decision has been confirmed with: wife Family Communication: Wife is at the bedside and has  been updated on plan of care.   Severity of Illness:  The appropriate patient status for this patient is INPATIENT. Inpatient status is judged to be reasonable and necessary in order to provide the required intensity of service to ensure the patient's safety. The patient's presenting symptoms, physical exam findings, and initial radiographic and laboratory data in the context of their chronic comorbidities is felt to place them at high risk for further clinical deterioration. Furthermore, it is not anticipated that the patient will be medically stable for discharge from the hospital within 2 midnights of admission.   * I certify that at the point of admission it is my clinical judgment that the patient will require inpatient hospital care spanning beyond 2 midnights from the point of admission due to high intensity of service, high risk for further deterioration and high frequency of surveillance required.*  Time spent:  39 minutes  Author:  Marinda Elk MD  04/22/2023 9:54 AM

## 2023-04-22 NOTE — Assessment & Plan Note (Signed)
 New diagnosis  Prognosis extremely poor  received 1 round of chemotherapy earlier in the hospitalization

## 2023-04-22 NOTE — Assessment & Plan Note (Signed)
 Comfort feeds only now

## 2023-04-22 NOTE — Assessment & Plan Note (Signed)
 Supportive care, see notations above.

## 2023-04-22 NOTE — Assessment & Plan Note (Signed)
 Multifactorial secondary to multifocal pneumonia, volume overload and COPD Status post extubation 4/4 small amount of supplemental oxygen for comfort

## 2023-04-22 NOTE — TOC Progression Note (Signed)
 Transition of Care [facility]) - Progression Note    Patient Details  Name: Ronnie Dunn MRN: [id number] Date of Birth: September 04, 1960  Transition [facility] Care [facility]) CM/SW Contact  Adrian Prows, RN Phone Number: 04/22/2023, 9:20 AM  Clinical Narrative:    Pt now comfort care; TOC is signing off; please place consult if needed.   Expected Discharge Plan: Home w Home Health Services Barriers to Discharge: Continued Medical Work up  Expected Discharge Plan and Services   Discharge Planning Services: CM Consult Post Acute Care Choice: Home Health Living arrangements for the past 2 months: Single Family Home                           HH Arranged: PT HH Agency: [facility] Date [facility] Agency Contacted: 04/06/23 Time HH Agency Contacted: 1100 Representative spoke with at [facility] Agency: Amy   Social Determinants of Health (SDOH) Interventions SDOH Screenings   Food Insecurity: No Food Insecurity (04/03/2023)  Housing: Low Risk  (04/03/2023)  Transportation Needs: No Transportation Needs (04/03/2023)  Utilities: Not At Risk (04/03/2023)  Depression (PHQ2-9): Low Risk  (08/04/2021)  Recent Concern: Depression (PHQ2-9) - Medium Risk (05/28/2021)  Social Connections: Moderately Isolated (04/03/2023)  Tobacco Use: High Risk (04/03/2023)    Readmission Risk Interventions    04/06/2023   11:05 AM 06/23/2021   11:48 AM  Readmission Risk Prevention Plan  Transportation Screening Complete Complete  PCP or Specialist Appt within 5-7 Days  Complete  PCP or Specialist Appt within 3-5 Days Complete   Home Care Screening  Complete  Medication Review (RN CM)  Complete  HRI or Home Care Consult Complete   Social Work Consult for Recovery Care Planning/Counseling Complete   Palliative Care Screening Complete   Medication Review Oceanographer) Complete

## 2023-04-22 NOTE — Hospital Course (Signed)
 62yo with h/o COPD, OSA, and afib on Xarelto who presneted on 3/17 with generalized weakness, confusion, and cough.  He was diagnosed with sepsis due to PNA but continued to deteriorate.  Complications include AKI, worsening encephalopathy, afib with RVR, diagnosis of multiple myeloma with treatment x 1 with chemotherapy, epistaxis requiring intubation for airway protection, and acute R PCA infarct with hemorrhage.  Ultimately, he was terminally extubated for comfort care.

## 2023-04-22 NOTE — Assessment & Plan Note (Signed)
 Supportive care Accu-Cheks discontinued

## 2023-04-22 NOTE — Plan of Care (Signed)
  Problem: Education: Goal: Knowledge of General Education information will improve Description: Including pain rating scale, medication(s)/side effects and non-pharmacologic comfort measures Outcome: Progressing   Problem: Health Behavior/Discharge Planning: Goal: Ability to manage health-related needs will improve Outcome: Progressing   Problem: Clinical Measurements: Goal: Ability to maintain clinical measurements within normal limits will improve Outcome: Progressing Goal: Will remain free from infection Outcome: Progressing Goal: Diagnostic test results will improve Outcome: Progressing Goal: Respiratory complications will improve Outcome: Progressing Goal: Cardiovascular complication will be avoided Outcome: Progressing   Problem: Activity: Goal: Risk for activity intolerance will decrease Outcome: Progressing   Problem: Nutrition: Goal: Adequate nutrition will be maintained Outcome: Progressing   Problem: Coping: Goal: Level of anxiety will decrease Outcome: Progressing   Problem: Elimination: Goal: Will not experience complications related to bowel motility Outcome: Progressing Goal: Will not experience complications related to urinary retention Outcome: Progressing   Problem: Pain Managment: Goal: General experience of comfort will improve and/or be controlled Outcome: Progressing   Problem: Safety: Goal: Ability to remain free from injury will improve Outcome: Progressing   Problem: Skin Integrity: Goal: Risk for impaired skin integrity will decrease Outcome: Progressing   Problem: Education: Goal: Ability to demonstrate management of disease process will improve Outcome: Progressing Goal: Ability to verbalize understanding of medication therapies will improve Outcome: Progressing Goal: Individualized Educational Video(s) Outcome: Progressing   Problem: Activity: Goal: Capacity to carry out activities will improve Outcome: Progressing    Problem: Cardiac: Goal: Ability to achieve and maintain adequate cardiopulmonary perfusion will improve Outcome: Progressing   Problem: Education: Goal: Ability to describe self-care measures that may prevent or decrease complications (Diabetes Survival Skills Education) will improve Outcome: Progressing Goal: Individualized Educational Video(s) Outcome: Progressing   Problem: Coping: Goal: Ability to adjust to condition or change in health will improve Outcome: Progressing   Problem: Fluid Volume: Goal: Ability to maintain a balanced intake and output will improve Outcome: Progressing   Problem: Health Behavior/Discharge Planning: Goal: Ability to identify and utilize available resources and services will improve Outcome: Progressing Goal: Ability to manage health-related needs will improve Outcome: Progressing   Problem: Metabolic: Goal: Ability to maintain appropriate glucose levels will improve Outcome: Progressing   Problem: Nutritional: Goal: Maintenance of adequate nutrition will improve Outcome: Progressing Goal: Progress toward achieving an optimal weight will improve Outcome: Progressing   Problem: Skin Integrity: Goal: Risk for impaired skin integrity will decrease Outcome: Progressing   Problem: Tissue Perfusion: Goal: Adequacy of tissue perfusion will improve Outcome: Progressing   Problem: Activity: Goal: Ability to tolerate increased activity will improve Outcome: Progressing   Problem: Respiratory: Goal: Ability to maintain a clear airway and adequate ventilation will improve Outcome: Progressing   Problem: Role Relationship: Goal: Method of communication will improve Outcome: Progressing   Problem: Safety: Goal: Non-violent Restraint(s) Outcome: Progressing

## 2023-04-22 NOTE — Assessment & Plan Note (Signed)
 Supportive care at this point due to comfort measures Dramatic reduction in urine output today No longer checking labs

## 2023-04-22 NOTE — Assessment & Plan Note (Signed)
 Managing symptomatically with antipyretics, supportive care

## 2023-04-22 NOTE — Assessment & Plan Note (Signed)
 Progressively worsening obtundation at this point with clinical decline Comfort measures, supportive care

## 2023-04-22 NOTE — Progress Notes (Signed)
 Dilaudid drip bag changed no residual left in old bag. Verified by Mara-RN.

## 2023-04-23 DIAGNOSIS — Z7189 Other specified counseling: Secondary | ICD-10-CM | POA: Diagnosis not present

## 2023-04-23 DIAGNOSIS — N179 Acute kidney failure, unspecified: Secondary | ICD-10-CM | POA: Diagnosis not present

## 2023-04-23 DIAGNOSIS — G9341 Metabolic encephalopathy: Secondary | ICD-10-CM | POA: Diagnosis not present

## 2023-04-23 DIAGNOSIS — J9601 Acute respiratory failure with hypoxia: Secondary | ICD-10-CM | POA: Diagnosis not present

## 2023-04-23 MED ORDER — ACETAMINOPHEN 10 MG/ML IV SOLN
1000.0000 mg | Freq: Once | INTRAVENOUS | Status: AC
  Administered 2023-04-23: 1000 mg via INTRAVENOUS
  Filled 2023-04-23: qty 100

## 2023-04-23 NOTE — Plan of Care (Signed)
  Problem: Education: Goal: Knowledge of General Education information will improve Description: Including pain rating scale, medication(s)/side effects and non-pharmacologic comfort measures Outcome: Progressing   Problem: Health Behavior/Discharge Planning: Goal: Ability to manage health-related needs will improve Outcome: Progressing   Problem: Clinical Measurements: Goal: Ability to maintain clinical measurements within normal limits will improve Outcome: Progressing Goal: Will remain free from infection Outcome: Progressing Goal: Diagnostic test results will improve Outcome: Progressing Goal: Respiratory complications will improve Outcome: Progressing Goal: Cardiovascular complication will be avoided Outcome: Progressing   Problem: Activity: Goal: Risk for activity intolerance will decrease Outcome: Progressing   Problem: Nutrition: Goal: Adequate nutrition will be maintained Outcome: Progressing   Problem: Coping: Goal: Level of anxiety will decrease Outcome: Progressing   Problem: Elimination: Goal: Will not experience complications related to bowel motility Outcome: Progressing Goal: Will not experience complications related to urinary retention Outcome: Progressing   Problem: Pain Managment: Goal: General experience of comfort will improve and/or be controlled Outcome: Progressing   Problem: Safety: Goal: Ability to remain free from injury will improve Outcome: Progressing   Problem: Skin Integrity: Goal: Risk for impaired skin integrity will decrease Outcome: Progressing   Problem: Education: Goal: Ability to demonstrate management of disease process will improve Outcome: Progressing Goal: Ability to verbalize understanding of medication therapies will improve Outcome: Progressing Goal: Individualized Educational Video(s) Outcome: Progressing   Problem: Activity: Goal: Capacity to carry out activities will improve Outcome: Progressing    Problem: Cardiac: Goal: Ability to achieve and maintain adequate cardiopulmonary perfusion will improve Outcome: Progressing   Problem: Education: Goal: Ability to describe self-care measures that may prevent or decrease complications (Diabetes Survival Skills Education) will improve Outcome: Progressing Goal: Individualized Educational Video(s) Outcome: Progressing   Problem: Coping: Goal: Ability to adjust to condition or change in health will improve Outcome: Progressing   Problem: Fluid Volume: Goal: Ability to maintain a balanced intake and output will improve Outcome: Progressing   Problem: Health Behavior/Discharge Planning: Goal: Ability to identify and utilize available resources and services will improve Outcome: Progressing Goal: Ability to manage health-related needs will improve Outcome: Progressing   Problem: Metabolic: Goal: Ability to maintain appropriate glucose levels will improve Outcome: Progressing   Problem: Nutritional: Goal: Maintenance of adequate nutrition will improve Outcome: Progressing Goal: Progress toward achieving an optimal weight will improve Outcome: Progressing   Problem: Skin Integrity: Goal: Risk for impaired skin integrity will decrease Outcome: Progressing   Problem: Tissue Perfusion: Goal: Adequacy of tissue perfusion will improve Outcome: Progressing   Problem: Activity: Goal: Ability to tolerate increased activity will improve Outcome: Progressing   Problem: Respiratory: Goal: Ability to maintain a clear airway and adequate ventilation will improve Outcome: Progressing   Problem: Role Relationship: Goal: Method of communication will improve Outcome: Progressing   Problem: Safety: Goal: Non-violent Restraint(s) Outcome: Progressing

## 2023-04-23 NOTE — Assessment & Plan Note (Addendum)
 Managing symptomatically with antipyretics, supportive care

## 2023-04-23 NOTE — Assessment & Plan Note (Addendum)
 Supportive care, see notations above.

## 2023-04-23 NOTE — Assessment & Plan Note (Addendum)
 Comfort feeds only now

## 2023-04-23 NOTE — Assessment & Plan Note (Addendum)
 Grade prognosis with frequent complications during this hospitalization repeated goals of care discussions with family members including parents and spouse, primarily conducted by PCCM Discussions between spouse/family and Dr. Wynona Neat including goals of care discussion on 4/3 resulted in desire for patient to undergo one-way extubation on 4/4 and initiation of comfort measures

## 2023-04-23 NOTE — Assessment & Plan Note (Addendum)
 Multifactorial secondary to multifocal pneumonia, volume overload and COPD Status post one-way extubation 4/4 small amount of supplemental oxygen for comfort

## 2023-04-23 NOTE — Progress Notes (Signed)
 PROGRESS NOTE   Ronnie Dunn  ZOX:[id number] DOB: 1960-06-11 DOA: 04/03/2023 PCP: [name], MD   Date of Service: the patient was seen and examined on 04/23/2023  Brief Narrative:  63 y.o. male with medical history significant for COPD, OSA, permanent atrial fibrillation on Xarelto who originally presented to [facility] emergency department on 3/17 with increasing generalized weakness, confusion and cough.    Upon valuation the emergency department patient was felt to be suffering from sepsis secondary to community-acquired pneumonia.  Patient progressively worsened, complicated by acute kidney injury and worsening encephalopathy thought to possibly be exacerbated by cefepime.  Hospitalization was also complicated by atrial fibrillation with rapid ventricular response.  Patient was transferred to the ICU under the PCCM service on 3/21.    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  With the assistance of Dr. Myna Hidalgo with oncology patient did receive a round of chemotherapy.    Further complications that developed included patient developing epistaxis after attempted core track placement on 3/27 and required intubation for airway protection at that time.  Then, patient developed new right PCA territory infarct with concurrent petechial hemorrhage on 3/28.  For which Dr. Roda Shutters with neurology was consulted.  Due to patient's progressively worsening clinical condition and repeated discussions about goals of care with the wife in an effort to reduce suffering patient was transitioned to comfort measures on 4/4 with terminal extubation.  Patient has now been transferred back to the hospitalist service on 4/5 with continued comfort measures.   Patient was placed on a Dilaudid infusion with as needed Ativan and Robinul for secretions.   Assessment & Plan Acute metabolic  encephalopathy Severe and worsening obtundation, multifactorial in origin with continued clinical decline Comfort measures, supportive care Acute respiratory failure with hypoxia (HCC) Multifactorial secondary to multifocal pneumonia, volume overload and COPD Status post one-way extubation 4/4 small amount of supplemental oxygen for comfort Staphylococcal pneumonia (HCC) Managing symptomatically with antipyretics, supportive care Sepsis (HCC) Supportive care, see notations above. 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 Acute renal failure superimposed on stage 3a chronic kidney disease (HCC) Supportive care at this point due to comfort measures Consistent reduction in urine output consistent with progressive renal failure. No longer checking labs Protein-calorie malnutrition, severe (HCC) Comfort feeds only now Type 2 diabetes mellitus with stage 3a chronic kidney disease, with long-term current use of insulin (HCC) Supportive care Accu-Cheks discontinued Goals of care, counseling/discussion Grade prognosis with frequent complications during this hospitalization repeated goals of care discussions with family members including parents and spouse, primarily conducted by PCCM Discussions between spouse/family and Dr. Wynona Neat including goals of care discussion on 4/3 resulted in desire for patient to undergo one-way extubation on 4/4 and initiation of comfort measures     Subjective:  Patient unable to answer questions due to obtundation.  Physical Exam:  Vitals:   04/22/23 1946 04/23/23 0522 04/23/23 0605 04/23/23 0704  BP: 122/64 (!) 143/93    Pulse: (!) 108 (!) 101    Resp: 12     Temp: 99.9 F (37.7 C) (!) 101.4 F (38.6 C) (!) 100.4 F (38 C) 99.5 F (37.5 C)  TempSrc: Oral Oral Oral Oral  SpO2: (!) 81% (!) 81%    Weight:      Height:  Constitutional:  Patient is obtunded and not appearing in any distress. Skin: no rashes, no lesions, poor skin turgor noted. ENMT: Dry mucous membranes noted.  Posterior pharynx clear of any exudate or lesions.   Respiratory: Rales and rhonchi bilaterally, no wheezing, diminished breath sounds throughout, increasingly labored breathing compared to yesterday's exam. Cardiovascular: Regular rate and rhythm, no murmurs / rubs / gallops. No extremity edema. 2+ pedal pulses. No carotid bruits.  Abdomen: Abdomen is soft  Musculoskeletal: No joint deformity upper and lower extremities.  no contractures.    Data Reviewed:  I have personally reviewed and interpreted labs, imaging.  Significant findings are   CBC: Recent Labs  Lab 04/17/23 1622 04/18/23 0357 04/19/23 0509 04/20/23 0514 04/21/23 0431  WBC 5.9 5.2 4.2 3.6* 3.9*  NEUTROABS  --   --   --   --  2.8  HGB 8.5* 8.5* 7.9* 7.6* 8.7*  HCT 28.9* 29.1* 27.1* 26.4* 28.5*  MCV 103.2* 103.9* 105.0* 106.0* 101.1*  PLT 254 258 271 310 381   Basic Metabolic Panel: Recent Labs  Lab 04/17/23 0318 04/18/23 0357 04/19/23 0509 04/20/23 0514 04/21/23 0431  NA 144 143 141 140 144  K 4.3 4.2 3.8 4.0 4.2  CL 118* 117* 115* 114* 114*  CO2 21* 20* 19* 16* 21*  GLUCOSE 296* 341* 354* 390* 162*  BUN 64* 57* 57* 58* 54*  CREATININE 1.66* 1.77* 1.62* 1.77* 1.63*  CALCIUM 6.8* 6.8* 6.9* 7.3* 8.0*  MG 1.8  --  1.8 1.7 1.4*  PHOS 2.3* 2.6 2.5 1.7* 3.4   GFR: Estimated Creatinine Clearance: 62.2 mL/min (A) (by C-G formula based on SCr of 1.63 mg/dL (H)). Liver Function Tests: Recent Labs  Lab 04/21/23 0431  ALBUMIN 1.8*     Code Status:  DNR.  Code status decision has been confirmed with: wife Family Communication: Wife is at the bedside and has been updated on plan of care.   Severity of Illness:  The appropriate patient status for this patient is INPATIENT. Inpatient status is judged to be reasonable and necessary in order to provide the required  intensity of service to ensure the patient's safety. The patient's presenting symptoms, physical exam findings, and initial radiographic and laboratory data in the context of their chronic comorbidities is felt to place them at high risk for further clinical deterioration. Furthermore, it is not anticipated that the patient will be medically stable for discharge from the hospital within 2 midnights of admission.   * I certify that at the point of admission it is my clinical judgment that the patient will require inpatient hospital care spanning beyond 2 midnights from the point of admission due to high intensity of service, high risk for further deterioration and high frequency of surveillance required.*  Time spent:  30 minutes  Author:  Marinda Elk MD  04/23/2023 9:05 PM

## 2023-04-23 NOTE — Assessment & Plan Note (Addendum)
 Severe and worsening obtundation, multifactorial in origin with continued clinical decline Comfort measures, supportive care

## 2023-04-23 NOTE — Assessment & Plan Note (Addendum)
 Supportive care at this point due to comfort measures Consistent reduction in urine output consistent with progressive renal failure. No longer checking labs

## 2023-04-23 NOTE — Assessment & Plan Note (Addendum)
 Supportive care Accu-Cheks discontinued

## 2023-04-23 NOTE — Assessment & Plan Note (Addendum)
 New diagnosis  received 1 round of chemotherapy earlier in the hospitalization No further treatment.

## 2023-04-24 DIAGNOSIS — Z515 Encounter for palliative care: Secondary | ICD-10-CM

## 2023-04-24 MED ORDER — ACETAMINOPHEN 10 MG/ML IV SOLN
1000.0000 mg | Freq: Four times a day (QID) | INTRAVENOUS | Status: DC
  Administered 2023-04-24: 1000 mg via INTRAVENOUS
  Filled 2023-04-24 (×4): qty 100

## 2023-05-18 NOTE — Progress Notes (Signed)
 Progress Note   Patient: Ronnie Dunn QMV:[id number] DOB: [birth year] DOA: 04/03/2023     21 DOS: the patient was seen and examined on 04/26/2023   Brief hospital course: 62yo with h/o COPD, OSA, and afib on Xarelto who presneted on 3/17 with generalized weakness, confusion, and cough.  He was diagnosed with sepsis due to PNA but continued to deteriorate.  Complications include AKI, worsening encephalopathy, afib with RVR, diagnosis of multiple myeloma with treatment x 1 with chemotherapy, epistaxis requiring intubation for airway protection, and acute R PCA infarct with hemorrhage.  Ultimately, he was terminally extubated for comfort care.  Assessment and Plan:  Goals of care, counseling/discussion -> Comfort care Grave prognosis with frequent complications during this hospitalization Repeated goals of care discussions with family members including parents and spouse, primarily conducted by PCCM Discussions between spouse/family and Dr. Wynona Neat including goals of care discussion on 4/3 resulted in desire for patient to undergo one-way extubation on 4/4 and initiation of comfort measures Comfort care order set utilized Pain control with dilaudid drip Appears to be actively dying, will not pursue residential hospice at this time  Acute metabolic encephalopathy Severe and worsening obtundation, multifactorial in origin with continued clinical decline Comfort measures, supportive care  Acute respiratory failure with hypoxia  Multifactorial secondary to multifocal staphylococcal pneumonia with sepsis, volume overload and COPD Status post one-way extubation 4/4 Small amount of supplemental oxygen for comfort Added Ofirimev OTC x 24 hours for fever control/comfort  Multiple myeloma without remission  New diagnosis  Received 1 round of chemotherapy earlier in the hospitalization No further treatment is desired  Acute renal failure superimposed on stage 3a chronic kidney disease  Supportive  care at this point due to comfort measures Consistent reduction in urine output consistent with progressive renal failure No longer checking labs  Protein-calorie malnutrition, severe  Comfort feeds only now  Type 2 diabetes mellitus with stage 3a chronic kidney disease, with long-term current use of insulin Supportive care Accu-Cheks discontinued  Pressure injury Pressure Injury 04/09/23 Back Right;Mid Deep Tissue Pressure Injury - Purple or maroon localized area of discolored intact skin or blood-filled blister due to damage of underlying soft tissue from pressure and/or shear. 4cm X 2cm purple wound (Active)  04/09/23 1039  Location: Back  Location Orientation: Right;Mid  Staging: Deep Tissue Pressure Injury - Purple or maroon localized area of discolored intact skin or blood-filled blister due to damage of underlying soft tissue from pressure and/or shear.  Wound Description (Comments): 4cm X 2cm purple wound  Present on Admission: No     Pressure Injury 04/18/23 Buttocks Left Deep Tissue Pressure Injury - Purple or maroon localized area of discolored intact skin or blood-filled blister due to damage of underlying soft tissue from pressure and/or shear. (Active)  04/18/23 1820  Location: Buttocks  Location Orientation: Left  Staging: Deep Tissue Pressure Injury - Purple or maroon localized area of discolored intact skin or blood-filled blister due to damage of underlying soft tissue from pressure and/or shear.  Wound Description (Comments):   Present on Admission:           Consultants: PCCM ID Oncology Nephrology Neurology PT OT RT Nutrition TOC team  Procedures: Cortrak 3/21 EEG 3/23, 3/25 Intubation 3/27-4/3  Antibiotics: Cefepime 3/17-19 Ceftriaxone 3/19-21, 3/27-31 Zosyn 3/21-22, 3/31-4/1 Vancomycin 3/17, 3/20, 3/31-4/4  30 Day Unplanned Readmission Risk Score    Flowsheet Row ED to Hosp-Admission (Current) from 04/03/2023 in [facility]  30 Day Unplanned  Readmission Risk Score (%) 20.88 Filed at 04/22/2023 0801       This score is the patient's risk of an unplanned readmission within 30 days of being discharged (0 -100%). The score is based on dignosis, age, lab data, medications, orders, and past utilization.   Low:  0-14.9   Medium: 15-21.9   High: 22-29.9   Extreme: 30 and above           Subjective: Wife is at the bedside, planning to remain there throughout.  She has support, is "waiting for God to take him."  Agonal breathing, which is concerning to her.  She is also worried about fever.   Objective: Vitals:   04/23/23 0704 04/18/2023 0815  BP:  122/62  Pulse:  92  Resp:    Temp: 99.5 F (37.5 C) 98.5 F (36.9 C)  SpO2:  (!) 71%    Intake/Output Summary (Last 24 hours) at 04/30/2023 0938 Last data filed at 05/09/2023 0658 Gross per 24 hour  Intake 0 ml  Output 750 ml  Net -750 ml   Filed Weights   04/19/23 0500 04/20/23 0427 04/21/23 0701  Weight: 113.6 kg 113.6 kg 113.6 kg    Exam:  General:  Appears critically/terminally ill, agonal breathing Eyes:  normal lids, closed throughout ENT:  grossly normal lips & tongue, dry mm Cardiovascular:  RRR.  No LE edema.  Respiratory:   Diffuse rhonchi.  Agonalrespiratory effort. Abdomen:  soft, NT, ND Skin:  no rash or induration seen on limited exam Musculoskeletal:   no bony abnormality, L knee effusion that is apparently TTP (per wife report), no erythema Psychiatric:  obtunded, unresponsive Neurologic:  unable to perform  Data Reviewed: I have reviewed the patient's lab results since admission.  Pertinent labs for today include:   None     Family Communication: Wife was present at bedside, long discussion offering support  Disposition: Status is: Inpatient Remains inpatient appropriate because: actively dying     Time spent: 50 minutes  Unresulted Labs (From admission, onward)    None         Author: Jonah Blue, MD 05/13/2023 9:38 AM  For on call review [email or web address].

## 2023-05-18 NOTE — Death Summary Note (Signed)
 DEATH SUMMARY   Patient Details  Name: Ronnie Dunn MRN: [id number] DOB: 05-Mar-1960 Ronnie Dunn Admission/Discharge Information   Admit Date:  04/10/23  Date of Death: Date of Death: 05-01-23  Time of Death: Time of Death: 1757  Length of Stay: 05/15/2023   Principle Cause of death: Sepsis due to pneumonia  Hospital Diagnoses: Principal Problem:   End of life care Active Problems:   Type 2 diabetes mellitus with stage 3a chronic kidney disease, with long-term current use of insulin (HCC)   Protein-calorie malnutrition, severe (HCC)   Acute renal failure superimposed on stage 3a chronic kidney disease (HCC)   Acute metabolic encephalopathy   Acute respiratory failure with hypoxia (HCC)   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)   Sepsis (HCC)   Staphylococcal pneumonia (HCC)   Goals of care, counseling/discussion   Hospital Course: 63yo with h/o COPD, OSA, and afib on Xarelto who presneted on April 10, 2023 with generalized weakness, confusion, and cough.  He was diagnosed with sepsis due to PNA but continued to deteriorate.  Complications include AKI, worsening encephalopathy, afib with RVR, diagnosis of multiple myeloma with treatment x 1 with chemotherapy, epistaxis requiring intubation for airway protection, and acute R PCA infarct with hemorrhage.  Ultimately, he was terminally extubated for comfort care.  Assessment and Plan:   Goals of care, counseling/discussion -> Comfort care Grave prognosis with frequent complications during this hospitalization Repeated goals of care discussions with family members including parents and spouse, primarily conducted by PCCM Discussions between spouse/family and Dr. Wynona Neat including goals of care discussion on 4/3 resulted in desire for patient to undergo one-way extubation on 4/4 and initiation of comfort measures Comfort care order set utilized Pain controlled with dilaudid drip Peaceful death with wife at bedside on 2023-05-01   Acute  metabolic encephalopathy Severe and worsening obtundation, multifactorial in origin with continued clinical decline Comfort measures, supportive care   Acute respiratory failure with hypoxia  Multifactorial secondary to multifocal staphylococcal pneumonia with sepsis, volume overload and COPD Status post one-way extubation 4/4 Small amount of supplemental oxygen for comfort Added Ofirimev OTC x 24 hours for fever control/comfort   Multiple myeloma without remission  New diagnosis  Received 1 round of chemotherapy earlier in the hospitalization No further treatment is desired   Acute renal failure superimposed on stage 3a chronic kidney disease  Supportive care at this point due to comfort measures Consistent reduction in urine output consistent with progressive renal failure No longer checking labs   Protein-calorie malnutrition, severe  Comfort feeds only now   Type 2 diabetes mellitus with stage 3a chronic kidney disease, with long-term current use of insulin Supportive care Accu-Cheks discontinued   Pressure injury Pressure Injury 04/09/23 Back Right;Mid Deep Tissue Pressure Injury - Purple or maroon localized area of discolored intact skin or blood-filled blister due to damage of underlying soft tissue from pressure and/or shear. 4cm X 2cm purple wound (Active)  04/09/23 1039  Location: Back  Location Orientation: Right;Mid  Staging: Deep Tissue Pressure Injury - Purple or maroon localized area of discolored intact skin or blood-filled blister due to damage of underlying soft tissue from pressure and/or shear.  Wound Description (Comments): 4cm X 2cm purple wound  Present on Admission: No     Pressure Injury 04/18/23 Buttocks Left Deep Tissue Pressure Injury - Purple or maroon localized area of discolored intact skin or blood-filled blister due to damage of underlying soft tissue from pressure and/or shear. (Active)  04/18/23 1820  Location:  Buttocks  Location Orientation:  Left  Staging: Deep Tissue Pressure Injury - Purple or maroon localized area of discolored intact skin or blood-filled blister due to damage of underlying soft tissue from pressure and/or shear.  Wound Description (Comments):   Present on Admission:               Consultants: PCCM ID Oncology Nephrology Neurology PT OT RT Nutrition TOC team   Procedures: Cortrak 3/21 EEG 3/23, 3/25 Intubation 3/27-4/3   Antibiotics: Cefepime 3/17-19 Ceftriaxone 3/19-21, 3/27-31 Zosyn 3/21-22, 3/31-4/1 Vancomycin 3/17, 3/20, 3/31-4/4   The results of significant diagnostics from this hospitalization (including imaging, microbiology, ancillary and laboratory) are listed below for reference.   Significant Diagnostic Studies: VAS US CAROTID Result Date: 04/20/2023 Carotid Arterial Duplex Study Patient Name:  Ronnie Dunn  Date of Exam:   04/19/2023 Medical Rec #: [id number]      Accession #:    [id number] Date of Birth: 14-Feb-1960      Patient Gender: M Patient Age:   63 years Exam Location:  [facility] Procedure:      VAS US CAROTID Referring Phys: Terrilee Files [facility] --------------------------------------------------------------------------------  Indications:       CVA. Risk Factors:      Hypertension, Diabetes, current smoker. Other Factors:     Afib, CHF. Limitations        Today's exam was limited due to the high bifurcation of the                    carotid, the body habitus of the patient, the patient's                    inability or unwillingness to cooperate, patient on a                    ventilator and beard. Comparison Study:  Previous exam on 06/23/2021 showed bilateral 1-39% stenosis                    (ICA) Performing Technologist: Ernestene Mention RVT, RDMS  Examination Guidelines: A complete evaluation includes B-mode imaging, spectral Doppler, color Doppler, and power Doppler as needed of all accessible portions of each vessel. Bilateral testing is considered an integral part of a  complete examination. Limited examinations for reoccurring indications may be performed as noted.  Right Carotid Findings: +----------+--------+--------+--------+------------------+------------------+           PSV cm/sEDV cm/sStenosisPlaque DescriptionComments           +----------+--------+--------+--------+------------------+------------------+ CCA Prox  80      8                                                    +----------+--------+--------+--------+------------------+------------------+ CCA Distal61      14                                intimal thickening +----------+--------+--------+--------+------------------+------------------+ ICA Prox  74      24                                                   +----------+--------+--------+--------+------------------+------------------+  ICA Distal94      28                                                   +----------+--------+--------+--------+------------------+------------------+ ECA       174     0                                                    +----------+--------+--------+--------+------------------+------------------+ +----------+--------+-------+----------------+-------------------+           PSV cm/sEDV cmsDescribe        Arm Pressure (mmHG) +----------+--------+-------+----------------+-------------------+ Subclavian160            Multiphasic, WNL                    +----------+--------+-------+----------------+-------------------+ +---------+--------+--+--------+--+---------+ VertebralPSV cm/s60EDV cm/s16Antegrade +---------+--------+--+--------+--+---------+  Left Carotid Findings: +----------+--------+--------+--------+------------------+------------------+           PSV cm/sEDV cm/sStenosisPlaque DescriptionComments           +----------+--------+--------+--------+------------------+------------------+ CCA Prox  112     15                                                    +----------+--------+--------+--------+------------------+------------------+ CCA Distal140     19                                intimal thickening +----------+--------+--------+--------+------------------+------------------+ ICA Prox  112     38      1-39%   calcific                             +----------+--------+--------+--------+------------------+------------------+ ICA Mid   113     40                                                   +----------+--------+--------+--------+------------------+------------------+ ICA Distal91      32                                                   +----------+--------+--------+--------+------------------+------------------+ ECA       196     0                                                    +----------+--------+--------+--------+------------------+------------------+ +----------+--------+--------+----------------+-------------------+           PSV cm/sEDV cm/sDescribe        Arm Pressure (mmHG) +----------+--------+--------+----------------+-------------------+ Subclavian182             Multiphasic, WNL                    +----------+--------+--------+----------------+-------------------+ +---------+--------+--------+--------------+  VertebralPSV cm/sEDV cm/sNot identified +---------+--------+--------+--------------+   Summary: Right Carotid: The extracranial vessels were near-normal with only minimal wall                thickening or plaque. Left Carotid: Velocities in the left ICA are consistent with a 1-39% stenosis               (high end of range). Vertebrals:  Right vertebral artery demonstrates antegrade flow. Left vertebral              artery was not visualized. Subclavians: Normal flow hemodynamics were seen in bilateral subclavian              arteries. *See table(s) above for measurements and observations.  Electronically signed by Delia Heady Dunn on 04/20/2023 at 7:54:11 AM.    Final    MR ANGIO HEAD WO  CONTRAST Result Date: 04/19/2023 CLINICAL DATA:  Acute neurologic deficit EXAM: MRA HEAD WITHOUT CONTRAST TECHNIQUE: Angiographic images of the Circle of Willis were acquired using MRA technique without intravenous contrast. COMPARISON:  None Available. FINDINGS: POSTERIOR CIRCULATION: Vertebral arteries are normal. No proximal occlusion of the anterior or inferior cerebellar arteries. Basilar artery is normal. Superior cerebellar arteries are normal. Posterior cerebral arteries are normal. ANTERIOR CIRCULATION: Intracranial internal carotid arteries are normal. Anterior cerebral arteries are normal. Middle cerebral arteries are normal. Anatomic Variants: None Other: None. IMPRESSION: Normal intracranial MRA. Electronically Signed   By: Deatra Robinson M.D.   On: 04/19/2023 19:08   DG Abd 1 View Result Date: 04/19/2023 CLINICAL DATA:  Nasogastric tube placement. EXAM: ABDOMEN - 1 VIEW COMPARISON:  Same day. FINDINGS: Nasogastric tube tip and distal side hole are seen within the expected position of proximal stomach. IMPRESSION: Nasogastric tube tip and distal side hole are within expected position of stomach. Electronically Signed   By: Lupita Raider M.D.   On: 04/19/2023 09:10   DG Abd 1 View Result Date: 04/19/2023 CLINICAL DATA:  Orogastric tube placement EXAM: ABDOMEN - 1 VIEW COMPARISON:  04/13/2023 FINDINGS: Nasogastric tube tip is again seen within the proximal body of the stomach with the proximal side hole positioned at the expected gastroesophageal junction. The abdominal gas pattern is indeterminate due to a paucity of intra-abdominal gas. No free intraperitoneal gas within the subdiaphragmatic region. Cardiomegaly again noted. Probable small left pleural effusion noted. IMPRESSION: 1. Nasogastric tube tip within the proximal body of the stomach with the proximal side hole positioned at the expected gastroesophageal junction. Advancement by 10 cm is recommended. Electronically Signed   By: Helyn Numbers M.D.   On: 04/19/2023 02:45   CT HEAD WO CONTRAST ( ) Result Date: 04/19/2023 CLINICAL DATA:  Stroke follow-up EXAM: CT HEAD WITHOUT CONTRAST TECHNIQUE: Contiguous axial images were obtained from the base of the skull through the vertex without intravenous contrast. RADIATION DOSE REDUCTION: This exam was performed according to the departmental dose-optimization program which includes automated exposure control, adjustment of the mA and/or kV according to patient size and/or use of iterative reconstruction technique. COMPARISON:  04/05/2023 head CT and 04/14/2023 brain MRI FINDINGS: Brain: Subacute infarct of the right PCA territory. No acute hemorrhage. Brain parenchyma is otherwise normal. Vascular: Atherosclerotic calcification of the vertebral and internal carotid arteries at the skull base. No abnormal hyperdensity of the major intracranial arteries or dural venous sinuses. Skull: The visualized skull base, calvarium and extracranial soft tissues are normal. Sinuses/Orbits: Moderate mucosal thickening of the ethmoid sinus. Normal orbits. No mastoid effusion. Other: None. IMPRESSION:  Subacute infarct of the right PCA territory. No acute hemorrhage. Electronically Signed   By: Deatra Robinson M.D.   On: 04/19/2023 01:56   DG CHEST PORT 1 VIEW Result Date: 04/17/2023 CLINICAL DATA:  Fever. EXAM: PORTABLE CHEST 1 VIEW COMPARISON:  Radiographs 04/13/2023 and 04/11/2023.  CT 04/09/2023. FINDINGS: 0716 hours. Tip of the endotracheal tube overlies the mid trachea. Right arm PICC projects to the mid SVC level. Enteric tube projects over the mid stomach. The heart size and mediastinal contours are stable with aortic atherosclerosis. Bibasilar airspace opacities have slightly worsened. There is possible mild pulmonary edema with small bilateral pleural effusions. No evidence of pneumothorax. IMPRESSION: Slight worsening of bibasilar airspace opacities which may reflect atelectasis or pneumonia. Possible mild  pulmonary edema with small bilateral pleural effusions. Stable support system. Electronically Signed   By: Carey Bullocks M.D.   On: 04/17/2023 11:09   ECHOCARDIOGRAM LIMITED Result Date: 04/15/2023    ECHOCARDIOGRAM LIMITED REPORT   Patient Name:   TION TSE Date of Exam: 04/15/2023 Medical Rec #:  [id number]     Height:       74.0 in Accession #:    [id number]    Weight:       240.1 lb Date of Birth:  Apr 16, 1960     BSA:          2.349 m Patient Age:    62 years      BP:           109/63 mmHg Patient Gender: M             HR:           100 bpm. Exam Location:  Inpatient Procedure: Limited Echo, Cardiac Doppler and Limited Color Doppler (Both            Spectral and Color Flow Doppler were utilized during procedure). Indications:    Stroke  History:        Patient has prior history of Echocardiogram examinations, most                 recent 04/08/2023. CHF, Arrythmias:Atrial Fibrillation; Risk                 Factors:ETOH, Diabetes and Hypertension.  Sonographer:    Amy Chionchio Referring Phys: [id number] Migdalia Dk IMPRESSIONS  1. Left ventricular ejection fraction, by estimation, is 60 to 65%. The left ventricle has normal function. The left ventricle has no regional wall motion abnormalities. Left ventricular diastolic function could not be evaluated.  2. Right ventricular systolic function is mildly reduced. The right ventricular size is mildly enlarged. There is mildly elevated pulmonary artery systolic pressure. The estimated right ventricular systolic pressure is 43.1 mmHg.  3. Left atrial size was severely dilated.  4. Right atrial size was severely dilated.  5. The mitral valve is normal in structure. Trivial mitral valve regurgitation. No evidence of mitral stenosis.  6. The aortic valve is tricuspid. There is mild calcification of the aortic valve. Aortic valve sclerosis/calcification is present, without any evidence of aortic stenosis.  7. The inferior vena cava is dilated in size with <50%  respiratory variability, suggesting right atrial pressure of 15 mmHg. Comparison(s): No significant change from prior study. Prior images reviewed side by side. FINDINGS  Left Ventricle: Left ventricular ejection fraction, by estimation, is 60 to 65%. The left ventricle has normal function. The left ventricle has no regional wall motion abnormalities. Left ventricular diastolic function could not be evaluated.  Left ventricular diastolic function could not be evaluated due to atrial fibrillation. Right Ventricle: The right ventricular size is mildly enlarged. Right ventricular systolic function is mildly reduced. There is mildly elevated pulmonary artery systolic pressure. The tricuspid regurgitant velocity is 2.65 m/s, and with an assumed right atrial pressure of 15 mmHg, the estimated right ventricular systolic pressure is 43.1 mmHg. Left Atrium: Left atrial size was severely dilated. Right Atrium: Right atrial size was severely dilated. Pericardium: Trivial pericardial effusion is present. Mitral Valve: The mitral valve is normal in structure. Mild mitral annular calcification. Trivial mitral valve regurgitation. No evidence of mitral valve stenosis. Tricuspid Valve: The tricuspid valve is normal in structure. Tricuspid valve regurgitation is mild. Aortic Valve: The aortic valve is tricuspid. There is mild calcification of the aortic valve. Aortic valve sclerosis/calcification is present, without any evidence of aortic stenosis. Pulmonic Valve: The pulmonic valve was not well visualized. Aorta: The aortic root is normal in size and structure. Venous: The inferior vena cava is dilated in size with less than 50% respiratory variability, suggesting right atrial pressure of 15 mmHg. IAS/Shunts: No atrial level shunt detected by color flow Doppler. LEFT VENTRICLE PLAX 2D LVOT diam:     2.20 cm LVOT Area:     3.80 cm  LV Volumes (MOD) LV vol d, MOD A4C: 111.0 ml LV vol s, MOD A4C: 37.9 ml LV SV MOD A4C:     111.0 ml  RIGHT VENTRICLE          IVC RV Basal diam:  4.00 cm  IVC diam: 2.50 cm RV Mid diam:    3.60 cm TAPSE (M-mode): 0.8 cm LEFT ATRIUM           Index        RIGHT ATRIUM           Index LA Vol (A4C): 97.4 ml 41.47 ml/m  RA Area:     29.30 cm                                    RA Volume:   106.00 ml 45.13 ml/m  TRICUSPID VALVE TR Peak grad:   28.1 mmHg TR Vmax:        265.00 cm/s  SHUNTS Systemic Diam: 2.20 cm Mihai Croitoru Dunn Electronically signed by Thurmon Fair Dunn Signature Date/Time: 04/15/2023/1:09:39 PM    Final    MR BRAIN WO CONTRAST Result Date: 04/14/2023 CLINICAL DATA:  Mental status change, unknown cause EXAM: MRI HEAD WITHOUT CONTRAST TECHNIQUE: Multiplanar, multiecho pulse sequences of the brain and surrounding structures were obtained without intravenous contrast. COMPARISON:  CT head April 05, 2023 FINDINGS: Brain: Acute right PCA territory infarcts in the right occipital lobe with petechial hemorrhage. Additional acute infarct in the right parietal lobe and small acute infarct in the right frontal lobe. Areas of associated edema without substantial mass effect. No midline shift. No hydrocephalus or mass lesion. Vascular: Major arterial flow voids are maintained at the skull base. Skull and upper cervical spine: Normal marrow signal. Sinuses/Orbits: Paranasal sinus mucosal thickening. No acute orbital findings. Other: Small mastoid effusions. IMPRESSION: 1. Acute right PCA territory infarcts in the right occipital lobe with petechial hemorrhage. 2. Additional acute infarct in the right parietal lobe and small acute infarct in the right frontal lobe. These results will be called to the ordering clinician or representative by the Radiologist Assistant, and communication documented in the PACS or [facility]  Dashboard. Electronically Signed   By: Feliberto Harts M.D.   On: 04/14/2023 20:52   DG Abd 1 View Result Date: 04/13/2023 CLINICAL DATA:  OG tube placement EXAM: ABDOMEN - 1 VIEW COMPARISON:   04/13/2023 FINDINGS: Limited field of view for tube placement verification purposes. An enteric tube is present with tip projecting over the left upper quadrant consistent with location in the upper stomach. This is been advanced since the previous study. Visualized bowel gas pattern is unremarkable. IMPRESSION: Enteric tube tip projects over the left upper quadrant consistent with location in the upper stomach. Electronically Signed   By: Burman Nieves M.D.   On: 04/13/2023 23:22   DG Abd 1 View Result Date: 04/13/2023 CLINICAL DATA:  Orogastric tube placement. EXAM: ABDOMEN - 1 VIEW COMPARISON:  Earlier today. FINDINGS: Orogastric tube tip in the proximal to mid stomach and side hole probably just inferior to the gastroesophageal junction. The included bowel-gas pattern is unremarkable. Progressive bibasilar lung opacity. IMPRESSION: 1. Orogastric tube tip in the proximal to mid stomach and side hole probably just inferior to the gastroesophageal junction. Recommend advancing the tube 5 cm. 2. Mildly progressive bilateral lower lobe atelectasis/pneumonia. Electronically Signed   By: Beckie Salts M.D.   On: 04/13/2023 15:36   Portable Chest x-ray Result Date: 04/13/2023 CLINICAL DATA:  Endotracheal tube placement EXAM: PORTABLE CHEST - 1 VIEW COMPARISON:  04/11/2023 FINDINGS: Endotracheal tube tip approximately 4.8 cm above carina. The nasogastric tube appears looped in the oropharynx. There is also a metallic foreign body projecting over the pharynx. Right arm PICC line to the distal SVC. Some improvement in the bilateral infrahilar consolidation, left greater than right. Heart size and mediastinal contours are within normal limits. No effusion. Visualized bones unremarkable. IMPRESSION: 1. Endotracheal tube tip 4.8 cm above carina. 2. Nasogastric tube looped in the oropharynx. 3. Improving infrahilar consolidation. Electronically Signed   By: Corlis Leak M.D.   On: 04/13/2023 11:13   DG Abd 1  View Result Date: 04/13/2023 CLINICAL DATA:  [id number] Encounter for nasogastric (NG) tube placement [id number] EXAM: ABDOMEN - 1 VIEW COMPARISON:  the previous day's study FINDINGS: The internal tip of the nasogastric tube is not visualized. Visualized bowel gas pattern unremarkable. Lower abdomen excluded. Left retrocardiac consolidation/atelectasis stable. IMPRESSION: Nasogastric tube not visualized. Electronically Signed   By: Corlis Leak M.D.   On: 04/13/2023 11:11   [place] EKG SITE RITE Result Date: 04/13/2023 If Site Rite image not attached, placement could not be confirmed due to current cardiac rhythm.  DG Abd 1 View Result Date: 04/12/2023 CLINICAL DATA:  NG tube EXAM: ABDOMEN - 1 VIEW COMPARISON:  04/07/2023 FINDINGS: No enteric tube visualized on the included portions of the lower chest and upper abdomen. Upper gas pattern is unremarkable IMPRESSION: No enteric tube visualized on the included portions of the lower chest and upper abdomen. Electronically Signed   By: Jasmine Pang M.D.   On: 04/12/2023 23:14   DG Chest 1 View Result Date: 04/11/2023 CLINICAL DATA:  Heart failure and altered mental status EXAM: CHEST  1 VIEW COMPARISON:  04/08/2023 FINDINGS: Enteric tube tip below the diaphragm but incompletely assessed. Cardiomegaly with small pleural effusions and pulmonary vascular congestion. Consolidation at the left base. No pneumothorax IMPRESSION: Cardiomegaly with small pleural effusions and pulmonary vascular congestion. Consolidation at the left base may be due to atelectasis or pneumonia. Electronically Signed   By: Jasmine Pang M.D.   On: 04/11/2023 17:17   EEG adult Result Date: 04/11/2023  Charlsie Quest, Dunn     04/11/2023  7:49 PM Patient Name: PREVIN JIAN MRN: [id number] Epilepsy Attending: Charlsie Quest Referring Physician/Provider: Kalman Shan, Dunn Date: 3/25/225 Duration: 22.47 mins Patient history: 63yo M with ams. EEG to evaluate for seizure Level of alertness: Awake AEDs  during EEG study: None Technical aspects: This EEG study was done with scalp electrodes positioned according to the 10-20 International system of electrode placement. Electrical activity was reviewed with band pass filter of 1-70Hz , sensitivity of 7 uV/mm, display speed of 1mm/sec with a 60Hz  notched filter applied as appropriate. EEG data were recorded continuously and digitally stored.  Video monitoring was available and reviewed as appropriate. Description: EEG showed continuous generalized 3 to 6 Hz theta-delta slowing. Hyperventilation and photic stimulation were not performed.  Of note, study was technically difficult due to myogenic artifact.  ABNORMALITY - Continuous slow, generalized IMPRESSION: This technically difficult study is suggestive of moderate diffuse encephalopathy. No seizures or epileptiform discharges were seen throughout the recording. Charlsie Quest   CT BONE MARROW BIOPSY & ASPIRATION Result Date: 04/11/2023 INDICATION: [id number] Anemia [id number] Hypercalcemia 2665 EXAM: CT GUIDED BONE MARROW ASPIRATION AND CORE BIOPSY MEDICATIONS: None. ANESTHESIA/SEDATION: Local anesthetic was administered. The patient was continuously monitored during the procedure by the interventional radiology nurse under my direct supervision. FLUOROSCOPY TIME:  CT dose; 337 mGycm COMPLICATIONS: None immediate. Estimated blood loss: <5 mL PROCEDURE: RADIATION DOSE REDUCTION: This exam was performed according to the departmental dose-optimization program which includes automated exposure control, adjustment of the mA and/or kV according to patient size and/or use of iterative reconstruction technique. Informed written consent was obtained from the patient and/or patient's representative after a thorough discussion of the procedural risks, benefits and alternatives. All questions were addressed. Maximal Sterile Barrier Technique was utilized including caps, mask, sterile gowns, sterile gloves, sterile drape, hand  hygiene and skin antiseptic. A timeout was performed prior to the initiation of the procedure. The patient was positioned prone and non-contrast localization CT was performed of the pelvis to demonstrate the iliac marrow spaces. Under sterile conditions and local anesthesia, an 11 gauge coaxial bone biopsy needle was advanced into the LEFT iliac marrow space. Needle position was confirmed with CT imaging. Initially, bone marrow aspiration was performed. Next, the 11 gauge outer cannula was utilized to obtain a 2 iliac bone marrow core biopsy. Needle was removed. Hemostasis was obtained with compression. The patient tolerated the procedure well. Samples were prepared with the cytotechnologist. IMPRESSION: Successful CT-guided bone marrow aspiration and biopsy. Roanna Banning, [facility] Radiology Electronically Signed   By: Roanna Banning M.D.   On: 04/11/2023 11:02   VAS [place] LOWER EXTREMITY VENOUS (DVT) Result Date: 04/10/2023  Lower Venous DVT Study Patient Name:  SIRIS HOOS  Date of Exam:   04/10/2023 Medical Rec #: [id number]      Accession #:    [id number] Date of Birth: August 21, 1960      Patient Gender: M Patient Age:   52 years Exam Location:  [facility] Procedure:      VAS [place] LOWER EXTREMITY VENOUS (DVT) Referring Phys: MURALI RAMASWAMY --------------------------------------------------------------------------------  Indications: Edema.  Performing Technologist: Chanda Busing RVT  Examination Guidelines: A complete evaluation includes B-mode imaging, spectral Doppler, color Doppler, and power Doppler as needed of all accessible portions of each vessel. Bilateral testing is considered an integral part of a complete examination. Limited examinations for reoccurring indications may be performed as noted. The  reflux portion of the exam is performed with the patient in reverse Trendelenburg.   +---------+---------------+---------+-----------+----------+--------------+ RIGHT    CompressibilityPhasicitySpontaneityPropertiesThrombus Aging +---------+---------------+---------+-----------+----------+--------------+ CFV      Full           Yes      Yes                                 +---------+---------------+---------+-----------+----------+--------------+ SFJ      Full                                                        +---------+---------------+---------+-----------+----------+--------------+ FV Prox  Full                                                        +---------+---------------+---------+-----------+----------+--------------+ FV Mid   Full                                                        +---------+---------------+---------+-----------+----------+--------------+ FV DistalFull                                                        +---------+---------------+---------+-----------+----------+--------------+ PFV      Full                                                        +---------+---------------+---------+-----------+----------+--------------+ POP      Full           Yes      Yes                                 +---------+---------------+---------+-----------+----------+--------------+ PTV      Full                                                        +---------+---------------+---------+-----------+----------+--------------+ PERO     Full                                                        +---------+---------------+---------+-----------+----------+--------------+   +---------+---------------+---------+-----------+----------+--------------+ LEFT     CompressibilityPhasicitySpontaneityPropertiesThrombus Aging +---------+---------------+---------+-----------+----------+--------------+ CFV      Full           Yes  Yes                                  +---------+---------------+---------+-----------+----------+--------------+ SFJ      Full                                                        +---------+---------------+---------+-----------+----------+--------------+ FV Prox  Full                                                        +---------+---------------+---------+-----------+----------+--------------+ FV Mid   Full                                                        +---------+---------------+---------+-----------+----------+--------------+ FV DistalFull                                                        +---------+---------------+---------+-----------+----------+--------------+ PFV      Full                                                        +---------+---------------+---------+-----------+----------+--------------+ POP      Full           Yes      Yes                                 +---------+---------------+---------+-----------+----------+--------------+ PTV      Full                                                        +---------+---------------+---------+-----------+----------+--------------+ PERO     Full                                                        +---------+---------------+---------+-----------+----------+--------------+     Summary: RIGHT: - There is no evidence of deep vein thrombosis in the lower extremity.  - No cystic structure found in the popliteal fossa.  LEFT: - There is no evidence of deep vein thrombosis in the lower extremity.  - No cystic structure found in the popliteal fossa.  *See table(s) above for measurements and observations. Electronically signed by Gerarda Fraction on 04/10/2023 at 6:15:38 PM.    Final  CT CHEST WO CONTRAST Result Date: 04/09/2023 CLINICAL DATA:  Respiratory failure. EXAM: CT CHEST WITHOUT CONTRAST TECHNIQUE: Multidetector CT imaging of the chest was performed following the standard protocol without IV contrast. RADIATION DOSE  REDUCTION: This exam was performed according to the departmental dose-optimization program which includes automated exposure control, adjustment of the mA and/or kV according to patient size and/or use of iterative reconstruction technique. COMPARISON:  04/07/2023. FINDINGS: Cardiovascular: Atherosclerotic calcification of the aorta, aortic valve and coronary arteries. Ascending aorta measures 4.0 cm (coronal image 66), stable. Enlarged pulmonic trunk and heart. Small pericardial effusion, similar. Mediastinum/Nodes: Thoracic inlet lymph nodes are not enlarged by CT size criteria. Mediastinal lymph nodes are not enlarged by CT size criteria. Hilar regions are difficult to definitively evaluate without IV contrast. No axillary adenopathy. Feeding tube in the esophagus. Lungs/Pleura: Image quality is degraded by expiratory phase imaging and respiratory motion. Centrilobular and paraseptal emphysema. Dependent atelectasis in the right lower lobe. Collapse/consolidation in the left lower lobe, as on 04/07/2023. No definite pleural fluid. Airway is unremarkable. Upper Abdomen: Feeding tube is followed into the gastric antrum but is not imaged distally. Visualized portions of the liver, adrenal glands, left kidney, spleen, pancreas, stomach and bowel are otherwise grossly unremarkable. No upper abdominal adenopathy. Musculoskeletal: Degenerative changes in the spine. Segmental lytic lesion and old fracture involving the left third posterior rib. Lucency in the C7 vertebral body (8/3). There may be a lytic lesion in the manubrium. Additional lytic lesion in the posterior left eighth rib. IMPRESSION: 1. Similar left lower lobe collapse/consolidation, possibly due to pneumonia. 2. Scattered lytic lesions, worrisome for multiple myeloma. 3. Small pericardial effusion, similar. 4. 4.0 cm ascending aortic aneurysm. Recommend annual imaging followup by CTA or MRA. This recommendation follows 2010  ACCF/AHA/AATS/ACR/ASA/SCA/SCAI/SIR/STS/SVM Guidelines for the Diagnosis and Management of Patients with Thoracic Aortic Disease. Circulation. 2010; 121: Z610-R604. Aortic aneurysm NOS (ICD10-I71.9). 5. Aortic atherosclerosis (ICD10-I70.0). Coronary artery calcification. 6. Enlarged pulmonic trunk, indicative of pulmonary arterial hypertension. Electronically Signed   By: Leanna Battles M.D.   On: 04/09/2023 15:30   Rapid EEG Result Date: 04/09/2023 Charlsie Quest, [place]     04/09/2023  8:41 AM Patient Name: CRIXUS MCAULAY MRN: [id number] Epilepsy Attending: Charlsie Quest Referring Physician/Provider: Kalman Shan, [place] Date: 04/08/2023 Duration: 2 Hours 48 mins Patient history: 63yo M with ams. EEG to evaluate for seizure Level of alertness: comatose/ lethargic AEDs during EEG study: None Technical aspects: This EEG was obtained using a 10 lead EEG system positioned circumferentially without any parasagittal coverage (rapid EEG). Computer selected EEG is reviewed as  well as background features and all clinically significant events. Description: EEG showed continuous generalized 3 to 6 Hz theta-delta slowing admixed with 12-14hz  beta activity. Hyperventilation and photic stimulation were not performed.   ABNORMALITY - Continuous slow, generalized IMPRESSION: This limited ceribell EEG is suggestive of moderate to severe diffuse encephalopathy. No seizures or epileptiform discharges were seen throughout the recording. Charlsie Quest   DG CHEST PORT 1 VIEW Result Date: 04/08/2023 CLINICAL DATA:  Follow-up pneumothorax EXAM: PORTABLE CHEST 1 VIEW COMPARISON:  04/07/2023 FINDINGS: Cardiac shadow is enlarged but stable. Feeding catheter is noted extending towards the stomach. The lungs are well aerated bilaterally. No focal pneumothorax or effusion is seen. Mild central vascular congestion is noted. No bony abnormality is seen. IMPRESSION: Mild vascular congestion without significant edema. No evidence of  pneumothorax. Electronically Signed   By: Alcide Clever M.D.   On: 04/08/2023 22:33  ECHOCARDIOGRAM COMPLETE Result Date: 04/08/2023    ECHOCARDIOGRAM REPORT   Patient Name:   WYETT NARINE Date of Exam: 04/08/2023 Medical Rec #:  [id number]     Height:       74.0 in Accession #:    [id number]    Weight:       233.9 lb Date of Birth:  04-13-1960     BSA:          2.323 m Patient Age:    62 years      BP:           183/116 mmHg Patient Gender: M             HR:           100 bpm. Exam Location:  Inpatient Procedure: 2D Echo, Cardiac Doppler and Color Doppler (Both Spectral and Color            Flow Doppler were utilized during procedure). Indications:    Acute Respiratory Distress  History:        Patient has prior history of Echocardiogram examinations, most                 recent 07/22/2022. Risk Factors:Hypertension, Diabetes, Current                 Smoker and Sleep Apnea.  Sonographer:    Karma Ganja Referring Phys: 28 MURALI RAMASWAMY  Sonographer Comments: Technically difficult study due to poor echo windows. IMPRESSIONS  1. Left ventricular ejection fraction, by estimation, is 55 to 60%. The left ventricle has normal function. The left ventricle has no regional wall motion abnormalities. There is mild left ventricular hypertrophy. Left ventricular diastolic parameters are indeterminate.  2. Right ventricular systolic function is moderately reduced. The right ventricular size is mildly enlarged. Mildly increased right ventricular wall thickness. There is normal pulmonary artery systolic pressure. The estimated right ventricular systolic pressure is 33.2 mmHg.  3. Left atrial size was mildly dilated.  4. Right atrial size was severely dilated.  5. A small pericardial effusion is present.  6. The mitral valve is normal in structure. No evidence of mitral valve regurgitation. No evidence of mitral stenosis.  7. The aortic valve was not well visualized. Aortic valve regurgitation is not visualized. No aortic  stenosis is present.  8. The inferior vena cava is dilated in size with >50% respiratory variability, suggesting right atrial pressure of 8 mmHg. Comparison(s): A prior study was performed on 06/22/2021. RV is dilated and reduced function compared to prior study. FINDINGS  Left Ventricle: Left ventricular ejection fraction, by estimation, is 55 to 60%. The left ventricle has normal function. The left ventricle has no regional wall motion abnormalities. The left ventricular internal cavity size was normal in size. There is  mild left ventricular hypertrophy. Left ventricular diastolic parameters are indeterminate. Right Ventricle: The right ventricular size is mildly enlarged. Mildly increased right ventricular wall thickness. Right ventricular systolic function is moderately reduced. There is normal pulmonary artery systolic pressure. The tricuspid regurgitant velocity is 2.51 m/s, and with an assumed right atrial pressure of 8 mmHg, the estimated right ventricular systolic pressure is 33.2 mmHg. Left Atrium: Left atrial size was mildly dilated. Right Atrium: Right atrial size was severely dilated. Pericardium: A small pericardial effusion is present. Mitral Valve: The mitral valve is normal in structure. No evidence of mitral valve regurgitation. No evidence of mitral valve stenosis. Tricuspid Valve: The tricuspid valve is normal in structure. Tricuspid valve regurgitation  is mild . No evidence of tricuspid stenosis. Aortic Valve: The aortic valve was not well visualized. Aortic valve regurgitation is not visualized. No aortic stenosis is present. Aortic valve mean gradient measures 3.0 mmHg. Aortic valve peak gradient measures 5.4 mmHg. Aortic valve area, by VTI measures 3.07 cm. Pulmonic Valve: The pulmonic valve was normal in structure. Pulmonic valve regurgitation is not visualized. No evidence of pulmonic stenosis. Aorta: The aortic root is normal in size and structure. Venous: The inferior vena cava is  dilated in size with greater than 50% respiratory variability, suggesting right atrial pressure of 8 mmHg. IAS/Shunts: The interatrial septum was not well visualized.  LEFT VENTRICLE PLAX 2D LVIDd:         5.20 cm   Diastology LVIDs:         3.90 cm   LV e' medial:    8.45 cm/s LV PW:         1.20 cm   LV E/e' medial:  10.5 LV IVS:        1.20 cm   LV e' lateral:   6.82 cm/s LVOT diam:     2.20 cm   LV E/e' lateral: 13.0 LV SV:         51 LV SV Index:   22 LVOT Area:     3.80 cm  RIGHT VENTRICLE            IVC RV Basal diam:  4.50 cm    IVC diam: 2.40 cm RV S prime:     9.28 cm/s TAPSE (M-mode): 0.8 cm LEFT ATRIUM              Index        RIGHT ATRIUM           Index LA diam:        4.70 cm  2.02 cm/m   RA Area:     33.90 cm LA Vol (A2C):   111.0 ml 47.78 ml/m  RA Volume:   139.00 ml 59.83 ml/m LA Vol (A4C):   59.2 ml  25.48 ml/m LA Biplane Vol: 87.3 ml  37.58 ml/m  AORTIC VALVE AV Area (Vmax):    2.93 cm AV Area (Vmean):   2.71 cm AV Area (VTI):     3.07 cm AV Vmax:           116.33 cm/s AV Vmean:          77.367 cm/s AV VTI:            0.166 m AV Peak Grad:      5.4 mmHg AV Mean Grad:      3.0 mmHg LVOT Vmax:         89.62 cm/s LVOT Vmean:        55.080 cm/s LVOT VTI:          0.134 m LVOT/AV VTI ratio: 0.81  AORTA Ao Root diam: 3.90 cm MITRAL VALVE               TRICUSPID VALVE MV Area (PHT): 3.78 cm    TR Peak grad:   25.2 mmHg MV Decel Time: 201 msec    TR Vmax:        251.00 cm/s MV E velocity: 88.43 cm/s                            SHUNTS  Systemic VTI:  0.13 m                            Systemic Diam: 2.20 cm Sunit Tolia Electronically signed by Tessa Lerner Signature Date/Time: 04/08/2023/2:44:11 PM    Final    CT CHEST ABDOMEN PELVIS WO CONTRAST Result Date: 04/07/2023 CLINICAL DATA:  Sepsis EXAM: CT CHEST, ABDOMEN AND PELVIS WITHOUT CONTRAST TECHNIQUE: Multidetector CT imaging of the chest, abdomen and pelvis was performed following the standard protocol without IV  contrast. RADIATION DOSE REDUCTION: This exam was performed according to the departmental dose-optimization program which includes automated exposure control, adjustment of the mA and/or kV according to patient size and/or use of iterative reconstruction technique. COMPARISON:  None Available. FINDINGS: CT CHEST FINDINGS Cardiovascular: Aortic atherosclerosis. Cardiomegaly. Three-vessel coronary artery calcifications. Small pericardial effusion. Mediastinum/Nodes: No enlarged mediastinal, hilar, or axillary lymph nodes. Thyroid gland, trachea, and esophagus demonstrate no significant findings. Lungs/Pleura: Mild centrilobular and paraseptal emphysema. Small bilateral pleural effusions Musculoskeletal: No chest wall abnormality. No acute osseous findings. CT ABDOMEN PELVIS FINDINGS Hepatobiliary: No solid liver abnormality is seen. No gallstones, gallbladder wall thickening, or biliary dilatation. Pancreas: Unremarkable. No pancreatic ductal dilatation or surrounding inflammatory changes. Spleen: Normal in size without significant abnormality. Adrenals/Urinary Tract: Adrenal glands are unremarkable. Kidneys are normal, without renal calculi, solid lesion, or hydronephrosis. Bladder is decompressed by Foley catheter. Stomach/Bowel: Stomach is within normal limits. Esophagogastric tube with tip in the proximal duodenal. Appendix appears normal. No evidence of bowel wall thickening, distention, or inflammatory changes. Vascular/Lymphatic: Aortic atherosclerosis. Infrarenal abdominal aortic aneurysm measuring up to 4.2 x 3.5 cm in caliber (series 2, image 69). No enlarged abdominal or pelvic lymph nodes. Reproductive: No mass or other abnormality. Other: Small fat containing inguinal hernias.  No ascites. Musculoskeletal: No acute osseous findings. IMPRESSION: 1.  Small bilateral pleural effusions. 2.  Small pericardial effusion. 3. Infrarenal abdominal aortic aneurysm measuring up to 4.2 x 3.5 cm in caliber. Recommend  follow-up CT or MR as appropriate in 12 months and referral to or continued care with vascular specialist. (Ref.: J Vasc Surg. 2018; 67:2-77 and J Am Coll Radiol 2013;10(10):789-794.) 4.  Coronary artery disease. Aortic Atherosclerosis (ICD10-I70.0) and Emphysema (ICD10-J43.9). Electronically Signed   By: Jearld Lesch M.D.   On: 04/07/2023 17:50   DG Chest 1 View Result Date: 04/07/2023 CLINICAL DATA:  Hypoxia. EXAM: CHEST  1 VIEW COMPARISON:  April 06, 2023. FINDINGS: Stable cardiomegaly. Right lung is clear. Minimal left basilar subsegmental atelectasis is noted. Bony thorax is unremarkable. IMPRESSION: Minimal left basilar subsegmental atelectasis. Electronically Signed   By: Lupita Raider M.D.   On: 04/07/2023 13:14   DG Abd Portable 1V Result Date: 04/07/2023 CLINICAL DATA:  NG tube placement EXAM: PORTABLE ABDOMEN - 1 VIEW COMPARISON:  None Available. FINDINGS: Limited x-ray for tube placement has feeding tube with tip extending to the right mid abdomen, possibly distal stomach or proximal duodenum. There are air-filled loops of distended bowel throughout the mid abdomen including some mildly dilated loops. Please correlate with history. Enlarged heart. IMPRESSION: Limited x-ray for tube placement has feeding tube extending to the right side of the abdomen, possibly distal stomach or proximal duodenum Electronically Signed   By: Karen Kays M.D.   On: 04/07/2023 13:00   DG Chest 1 View Result Date: 04/06/2023 CLINICAL DATA:  Hypoxia. EXAM: CHEST  1 VIEW COMPARISON:  Radiograph 04/03/2023 FINDINGS: The heart is enlarged. Increase in diffuse bilateral  interstitial and airspace opacities. Suspected small pleural effusions, left greater than right. No pneumothorax. IMPRESSION: 1. Cardiomegaly with diffuse bilateral interstitial and airspace opacities, increased from prior. Findings may represent pulmonary edema or multifocal pneumonia. 2. Suspected small pleural effusions, left greater than right.  Electronically Signed   By: Narda Rutherford M.D.   On: 04/06/2023 18:45   US RENAL Result Date: 04/05/2023 CLINICAL DATA:  Abdominal pain. EXAM: RENAL / URINARY TRACT ULTRASOUND COMPLETE COMPARISON:  None Available. FINDINGS: Right Kidney: Renal measurements: 11.4 x 7.1 x 5 cm = volume: 210 mL. Normal parenchymal echogenicity. No hydronephrosis. No visualized stone or focal lesion. Left Kidney: Renal measurements: 14 x 7.6 x 5.6 cm = volume: 313 mL. Normal parenchymal echogenicity. No hydronephrosis. No visualized stone or focal lesion. Bladder: Not visualized.  The patient voided prior to the exam. Other: None. IMPRESSION: 1. Normal sonographic appearance of the kidneys. 2. Urinary bladder is decompressed and not assessed on the current exam. Electronically Signed   By: Narda Rutherford M.D.   On: 04/05/2023 19:45   CT HEAD WO CONTRAST ( ) Result Date: 04/05/2023 CLINICAL DATA:  Initial evaluation for mental status change, unknown cause. EXAM: CT HEAD WITHOUT CONTRAST TECHNIQUE: Contiguous axial images were obtained from the base of the skull through the vertex without intravenous contrast. RADIATION DOSE REDUCTION: This exam was performed according to the departmental dose-optimization program which includes automated exposure control, adjustment of the mA and/or kV according to patient size and/or use of iterative reconstruction technique. COMPARISON:  CT from 06/21/2021 FINDINGS: Brain: Cerebral volume within normal limits. 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 No acute intracranial hemorrhage. No acute large vessel territory infarct. No mass lesion or midline shift. No hydrocephalus or extra-axial fluid collection. Vascular: No abnormal hyperdense vessel. Calcified atherosclerosis present at the skull base. Skull: Scalp soft tissues within normal limits.  Calvarium intact. Sinuses/Orbits: Globes and orbital soft tissues within  normal limits. Paranasal sinuses are largely clear. No significant mastoid effusion. Other: None. IMPRESSION: 1. No acute intracranial abnormality. 2. 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 Electronically Signed   By: Rise Mu M.D.   On: 04/05/2023 19:27   MR Lumbar Spine W Wo Contrast Result Date: 04/05/2023 CLINICAL DATA:  Acute low back pain for the past 2 weeks. EXAM: MRI LUMBAR SPINE WITHOUT AND WITH CONTRAST TECHNIQUE: Multiplanar and multiecho pulse sequences of the lumbar spine were obtained without and with intravenous contrast. CONTRAST:  10mL GADAVIST GADOBUTROL 1 MMOL/ML IV SOLN COMPARISON:  None Available. FINDINGS: Segmentation: Assumed standard. The last well-formed disc space is designated L5-S1 for the purposes of this report. Alignment:  Physiologic. Vertebrae: No fracture or evidence of discitis. Decreased T2 and T1 marrow signal with diffuse low-level bone marrow increased STIR signal and enhancement. No focal bone lesion. Schmorl's node involving the right aspect of the L2 superior endplate. Conus medullaris and cauda equina: Conus extends to the L1 level. Conus and cauda equina appear normal. No abnormal intrathecal enhancement. Paraspinal and other soft tissues: Infrarenal abdominal aortic aneurysm measuring 3.9 cm. Disc levels: T12-L1:  Negative. L1-L2:  Minimal disc bulging.  No stenosis. L2-L3:  Negative disc.  Mild left facet arthropathy.  No stenosis. L3-L4: Mild disc bulging and bilateral facet arthropathy. Prominent posterior epidural fat. Mild spinal canal stenosis. No neuroforaminal stenosis. L4-L5: Mild disc bulging with bulky right far lateral disc osteophyte complex. Mild bilateral facet arthropathy. Prominent posterior epidural  fat. Mild spinal canal stenosis. Moderate right lateral recess stenosis. Mild right neuroforaminal stenosis. No left neuroforaminal stenosis. L5-S1: Disc bulging and endplate spurring  asymmetric to the right. Prominent right foraminal disc protrusion. Mild bilateral facet arthropathy. Moderate bilateral neuroforaminal stenosis. No spinal canal stenosis. IMPRESSION: 1. Multilevel lumbar spondylosis as described above. Moderate bilateral neuroforaminal stenosis at L5-S1. 2. Mild spinal canal stenosis and moderate right lateral recess stenosis at L4-L5. 3. Diffuse low-level bone marrow increased STIR signal and enhancement, nonspecific but can be seen in the setting of anemia, smoking, obesity, or other hematologic disorders. 4. 3.9 cm infrarenal abdominal aortic aneurysm. Recommend follow-up ultrasound in 3 years. Electronically Signed   By: Obie Dredge M.D.   On: 04/05/2023 19:19   DG Chest Port 1 View Result Date: 04/03/2023 CLINICAL DATA:  Four day history of altered mental status and weakness EXAM: PORTABLE CHEST 1 VIEW COMPARISON:  Chest radiograph dated 06/21/2021 FINDINGS: Normal lung volumes. Increased bilateral interstitial and diffuse patchy opacities. Blunting of the costophrenic angles. No pneumothorax. Similar enlarged cardiomediastinal silhouette. No acute osseous abnormality. IMPRESSION: 1. Increased bilateral interstitial and diffuse patchy opacities, likely pulmonary edema. 2. Blunting of the costophrenic angles, which may represent small pleural effusions. 3. Similar cardiomegaly. Electronically Signed   By: Agustin Cree M.D.   On: 04/03/2023 12:43    Microbiology: Recent Results (from the past 240 hours)  Culture, blood (Routine X 2) w Reflex to ID Panel     Status: None   Collection Time: 04/17/23  7:34 AM   Specimen: BLOOD LEFT HAND  Result Value Ref Range Status   Specimen Description   Final    BLOOD LEFT HAND Performed at [facility], [address], [place], [place] [address]    Special Requests   Final    BOTTLES DRAWN AEROBIC ONLY Blood Culture results may not be optimal due to an inadequate volume of blood received in culture bottles Performed  at [facility], [address], [place], [place] [address]    Culture   Final    NO GROWTH 5 DAYS Performed at [facility], [address], [place], [place] [address]    Report Status 04/22/2023 FINAL  Final  Culture, blood (Routine X 2) w Reflex to ID Panel     Status: None   Collection Time: 04/17/23  7:36 AM   Specimen: BLOOD RIGHT HAND  Result Value Ref Range Status   Specimen Description   Final    BLOOD RIGHT HAND Performed at [facility], [address], [place], [place] [address]    Special Requests   Final    BOTTLES DRAWN AEROBIC ONLY Blood Culture results may not be optimal due to an inadequate volume of blood received in culture bottles Performed at [facility], [address] [place], [place] [address]    Culture   Final    NO GROWTH 5 DAYS Performed at [facility], [address], [place], [place] [address]    Report Status 04/22/2023 FINAL  Final  Culture, Respiratory w Gram Stain     Status: None   Collection Time: 04/17/23  9:51 AM   Specimen: Tracheal Aspirate; Respiratory  Result Value Ref Range Status   Specimen Description   Final    TRACHEAL ASPIRATE Performed at [facility], [address] [place], [place] [address]    Special Requests   Final    NONE Performed at [facility], [address] [place], [place] [address]    Gram Stain   Final    FEW WBC PRESENT, PREDOMINANTLY PMN FEW GRAM POSITIVE COCCI IN PAIRS RARE GRAM NEGATIVE RODS RARE GRAM POSITIVE RODS RARE BUDDING YEAST SEEN Performed at [facility], [address] [place], [place] [address]    Culture FEW METHICILLIN RESISTANT STAPHYLOCOCCUS AUREUS  Final   Report Status 04/20/2023 FINAL  Final   Organism ID, Bacteria METHICILLIN RESISTANT STAPHYLOCOCCUS AUREUS  Final      Susceptibility   Methicillin resistant staphylococcus aureus - MIC*    CIPROFLOXACIN >=8 RESISTANT Resistant      ERYTHROMYCIN >=8 RESISTANT Resistant     GENTAMICIN <=0.5 SENSITIVE Sensitive     OXACILLIN >=4 RESISTANT Resistant     TETRACYCLINE >=16 RESISTANT Resistant     VANCOMYCIN 1 SENSITIVE Sensitive     TRIMETH/SULFA >=320 RESISTANT Resistant     CLINDAMYCIN <=0.25 SENSITIVE Sensitive     RIFAMPIN <=0.5 SENSITIVE Sensitive     Inducible Clindamycin NEGATIVE Sensitive     LINEZOLID 2 SENSITIVE Sensitive     * FEW METHICILLIN RESISTANT STAPHYLOCOCCUS AUREUS  Culture, Urine (Do not remove urinary catheter, catheter placed by urology or difficult to place)     Status: None   Collection Time: 04/17/23 11:00 AM   Specimen: Urine, Catheterized  Result Value Ref Range Status   Specimen Description   Final    URINE, CATHETERIZED Performed at [facility], [address], [place], [place] [address]    Special Requests   Final    Immunocompromised Performed at [facility], [address], [place], [place] [address]    Culture   Final    NO GROWTH Performed at [facility], [address], [place], [place] [address]    Report Status 04/18/2023 FINAL  Final  MRSA Next Gen by PCR, Nasal     Status: None   Collection Time: 04/17/23 11:23 AM   Specimen: Urine, Catheterized; Nasal Swab  Result Value Ref Range Status   MRSA by PCR Next Gen NOT DETECTED NOT DETECTED Final    Comment: (NOTE) The GeneXpert MRSA Assay (FDA approved for NASAL specimens only), is one component of a comprehensive MRSA colonization surveillance program. It is not intended to diagnose MRSA infection nor to guide or monitor treatment for MRSA infections. Test performance is not FDA approved in patients less than 66 years old. Performed at [facility], [address], [place], [place] [address]     Time spent: <30 minutes  Signed: Jonah Blue, [place] 04/28/2023

## 2023-05-18 NOTE — Plan of Care (Signed)
  Problem: Pain Managment: Goal: General experience of comfort will improve and/or be controlled Outcome: Progressing   Problem: Safety: Goal: Ability to remain free from injury will improve Outcome: Progressing

## 2023-05-18 DEATH — deceased

## 2023-05-25 ENCOUNTER — Ambulatory Visit (INDEPENDENT_AMBULATORY_CARE_PROVIDER_SITE_OTHER): Payer: Medicaid Other | Admitting: Otolaryngology

## 2023-07-10 IMAGING — CT CT HEAD W/O CM
3 series · 16 of 47 positions shown, 19 images · non-contrast
Comparison: MRI brain 03/29/2020 and CT head 03/28/2020

CLINICAL DATA: Syncopal episode for 30 seconds with bowel
incontinence, nausea and dizziness, weakness.



[Series 2: head wo · axial · 0.48mm/px · z∈[-197,-47]mm · 10 of 36 slices shown, 13 images]
[im 3/36  brain]
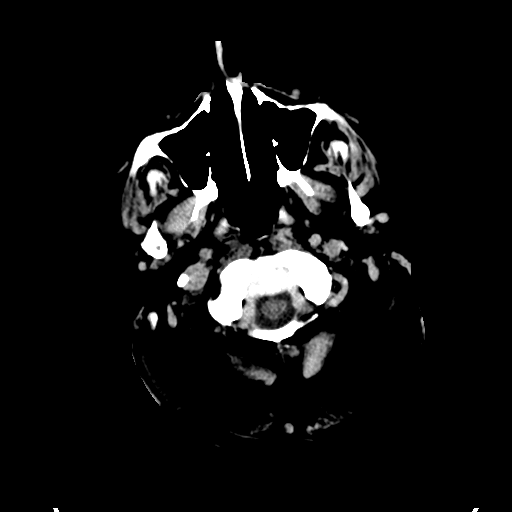
[im 3/36  bone]
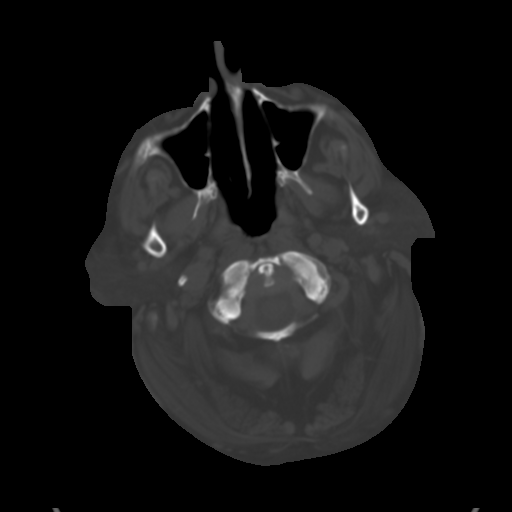
[im 7/36  brain]
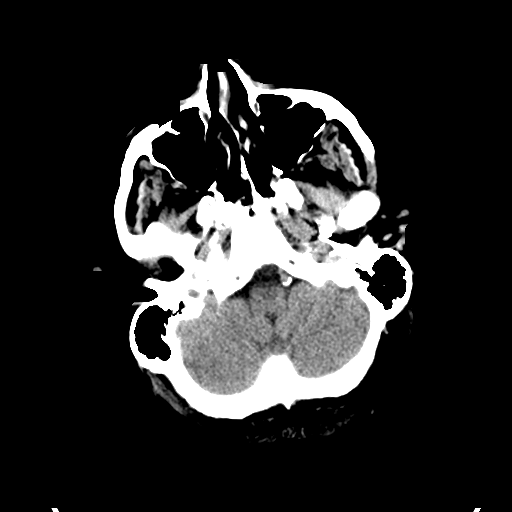
[im 10/36  brain]
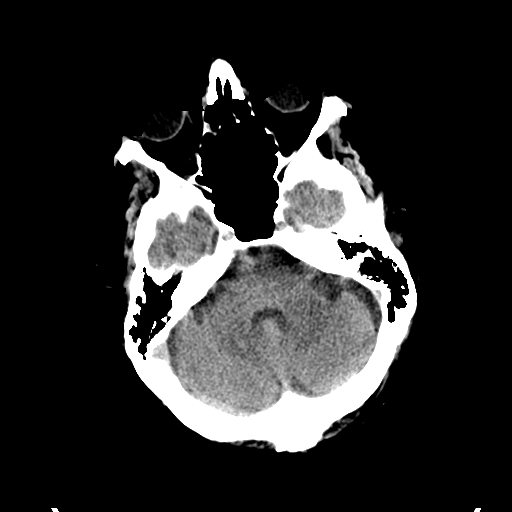
[im 13/36  brain]
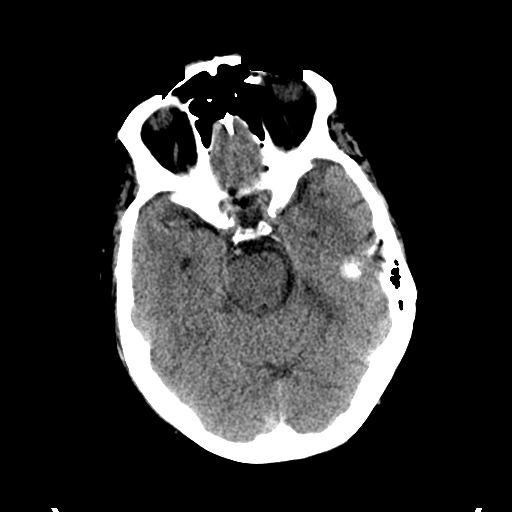
[im 16/36  brain]
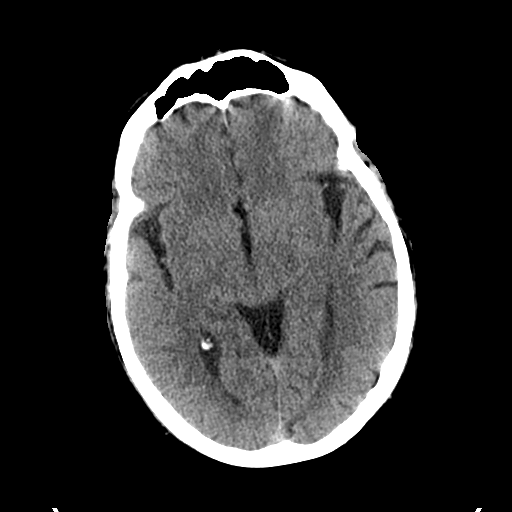
[im 16/36  bone]
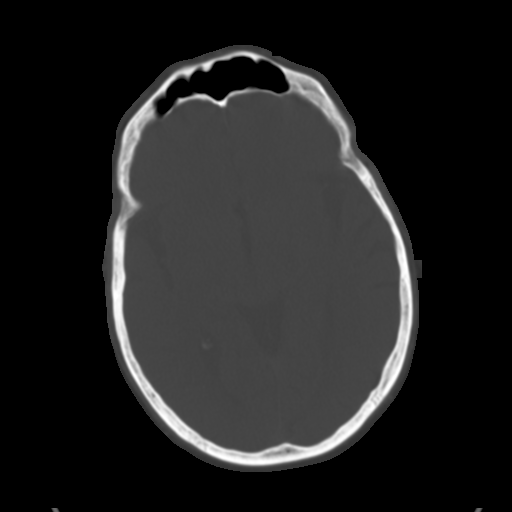
[im 20/36  brain]
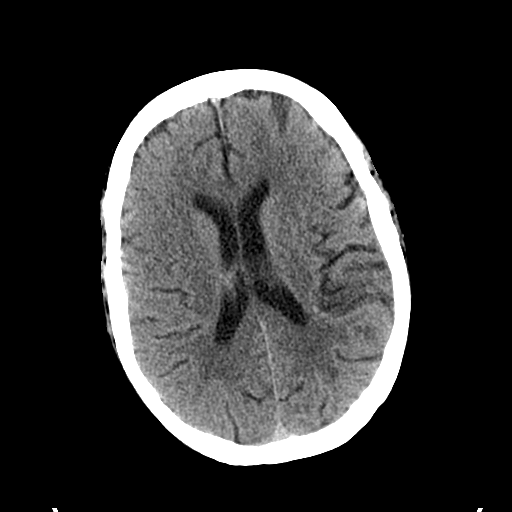
[im 23/36  brain]
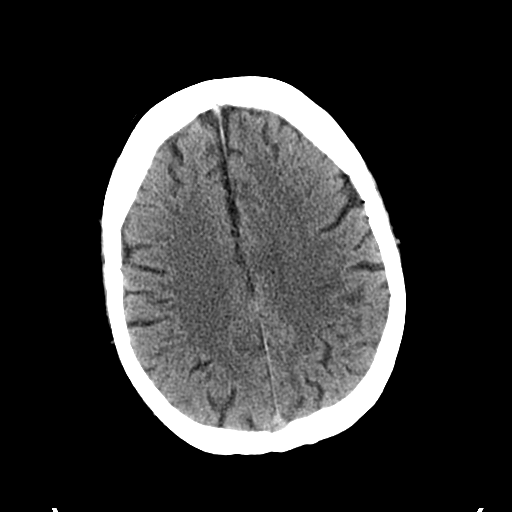
[im 27/36  brain]
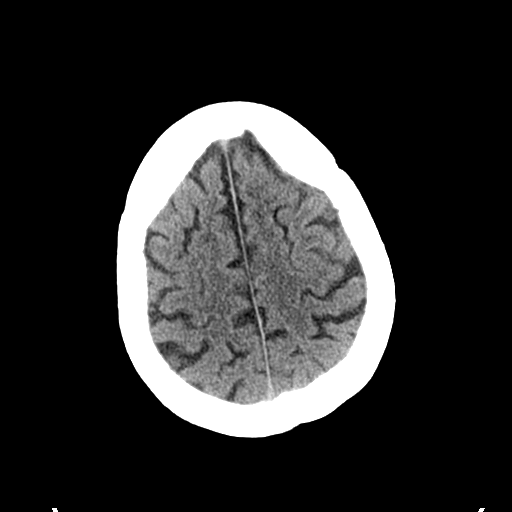
[im 29/36  brain]
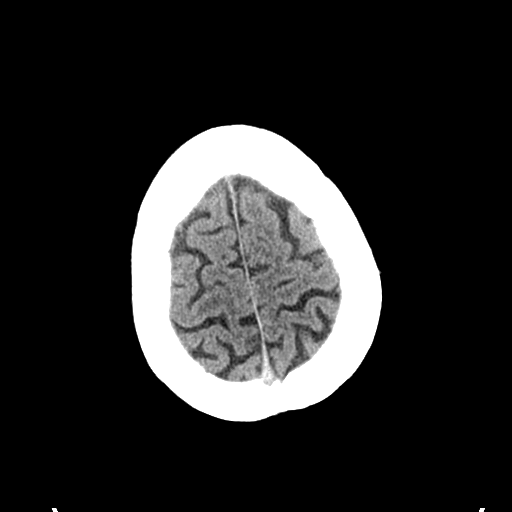
[im 29/36  bone]
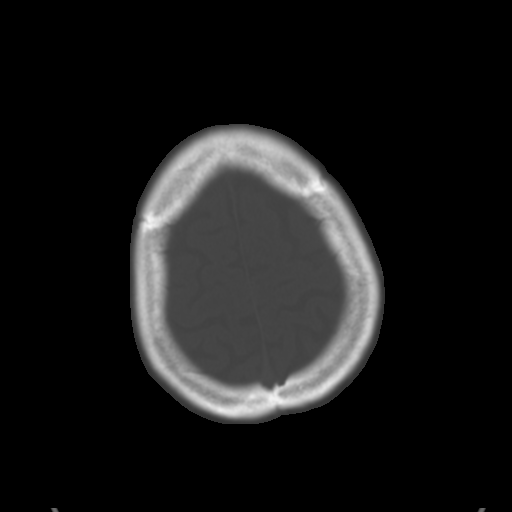
[im 33/36  brain]
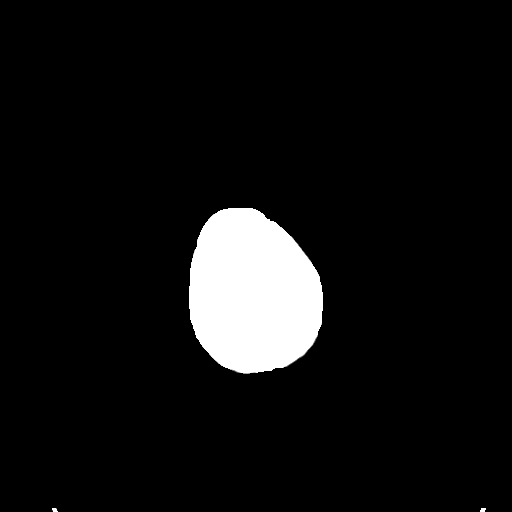

[Series 4: coronal soft tissue · coronal · 0.37mm/px · 3 of 76 slices shown]
[im 26/76  brain]
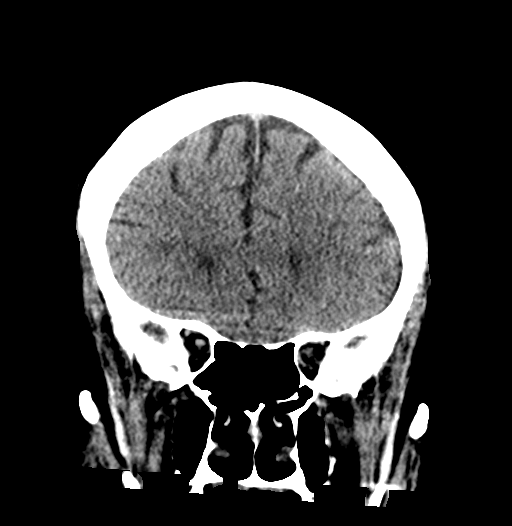
[im 34/76  brain]
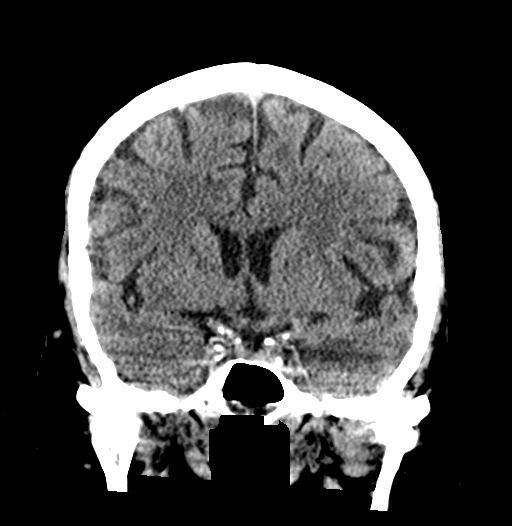
[im 42/76  brain]
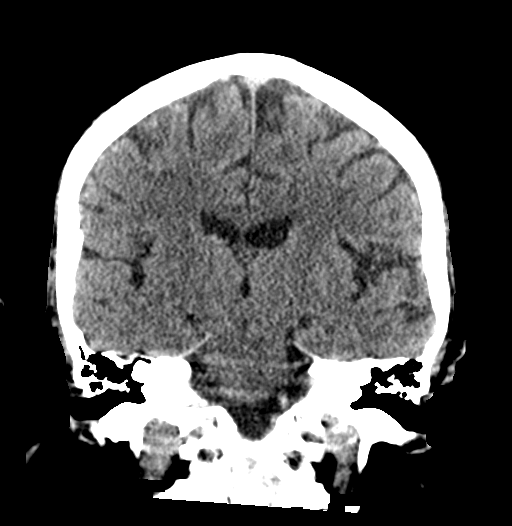

[Series 5: sagittal soft tissue · sagittal · 0.38mm/px · 3 of 64 slices shown]
[im 22/64  brain]
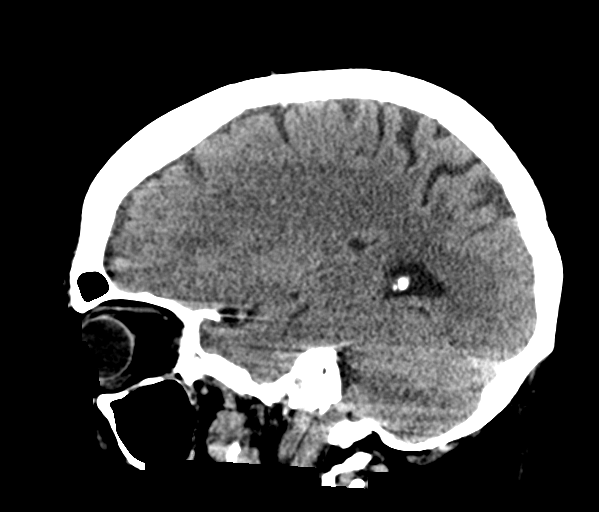
[im 32/64  brain]
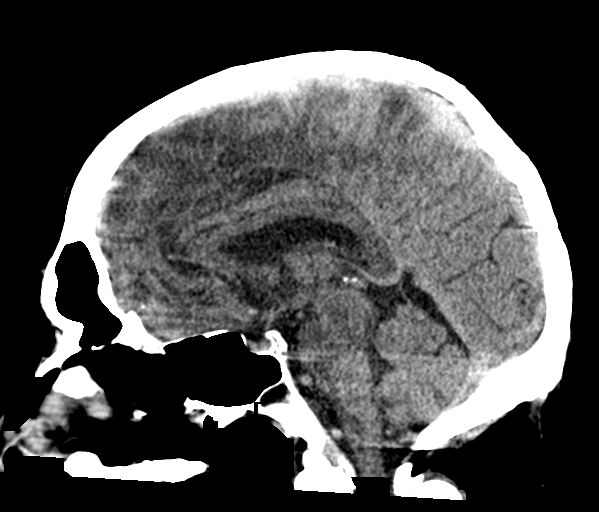
[im 43/64  brain]
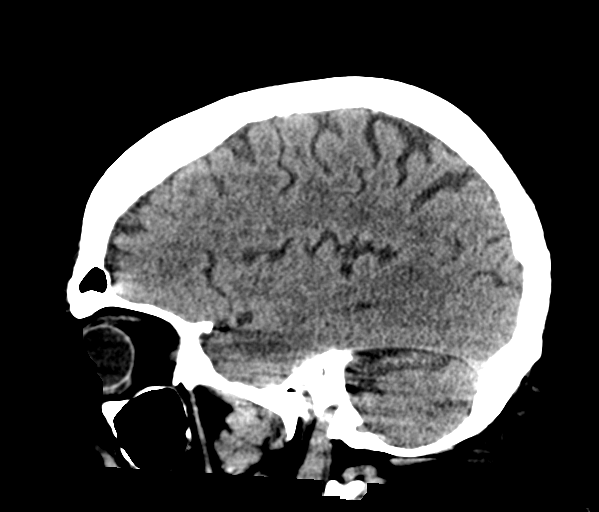

[16 of 47 positions shown; findings below may reference images not displayed]

FINDINGS: Brain: The brainstem, cerebellum, cerebral peduncles, thalami, basal
ganglia, basilar cisterns, and ventricular system appear within
normal limits. No intracranial hemorrhage, mass lesion, or acute
CVA.

Vascular: There is atherosclerotic calcification of the cavernous
carotid arteries bilaterally.

Skull: Unremarkable

Sinuses/Orbits: Unremarkable

Other: No supplemental non-categorized findings.
IMPRESSION: 1. No acute intracranial findings.
2. Atherosclerosis.
